# Patient Record
Sex: Female | Born: 1940 | Race: Black or African American | Hispanic: No | Marital: Single | Smoking: Former smoker
Health system: Southern US, Community
[De-identification: ages and names within clinical notes are randomized; demographics above are authoritative.]

## PROBLEM LIST (undated history)

## (undated) DIAGNOSIS — N189 Chronic kidney disease, unspecified: Secondary | ICD-10-CM

## (undated) DIAGNOSIS — G4700 Insomnia, unspecified: Secondary | ICD-10-CM

## (undated) DIAGNOSIS — E7800 Pure hypercholesterolemia, unspecified: Secondary | ICD-10-CM

## (undated) DIAGNOSIS — C50411 Malignant neoplasm of upper-outer quadrant of right female breast: Principal | ICD-10-CM

## (undated) DIAGNOSIS — D631 Anemia in chronic kidney disease: Secondary | ICD-10-CM

## (undated) DIAGNOSIS — C55 Malignant neoplasm of uterus, part unspecified: Secondary | ICD-10-CM

## (undated) DIAGNOSIS — Z973 Presence of spectacles and contact lenses: Secondary | ICD-10-CM

## (undated) DIAGNOSIS — F32A Depression, unspecified: Secondary | ICD-10-CM

## (undated) DIAGNOSIS — K279 Peptic ulcer, site unspecified, unspecified as acute or chronic, without hemorrhage or perforation: Secondary | ICD-10-CM

## (undated) DIAGNOSIS — M1990 Unspecified osteoarthritis, unspecified site: Secondary | ICD-10-CM

## (undated) DIAGNOSIS — I10 Essential (primary) hypertension: Secondary | ICD-10-CM

## (undated) DIAGNOSIS — C50919 Malignant neoplasm of unspecified site of unspecified female breast: Secondary | ICD-10-CM

## (undated) DIAGNOSIS — N184 Chronic kidney disease, stage 4 (severe): Secondary | ICD-10-CM

## (undated) DIAGNOSIS — F329 Major depressive disorder, single episode, unspecified: Secondary | ICD-10-CM

## (undated) HISTORY — DX: Malignant neoplasm of upper-outer quadrant of right female breast: C50.411

## (undated) HISTORY — DX: Chronic kidney disease, stage 4 (severe): N18.4

## (undated) HISTORY — DX: Anemia in chronic kidney disease: D63.1

## (undated) HISTORY — DX: Anemia in chronic kidney disease: N18.9

## (undated) HISTORY — DX: Malignant neoplasm of uterus, part unspecified: C55

## (undated) HISTORY — PX: COLONOSCOPY: SHX174

## (undated) NOTE — *Deleted (*Deleted)
[place]   [phone] [phone]   Clinic Follow up Note   Patient Care Team: Nolene Ebbs, MD as PCP - General (Internal Medicine) Jovita Kussmaul, MD as Consulting Physician (General Surgery) Truitt Merle, MD as Consulting Physician (Hematology) Thea Silversmith, MD as Consulting Physician (Radiation Oncology) Holley Bouche, NP (Inactive) as Nurse Practitioner (Nurse Practitioner)  Date of Service:  11/01/2020  CHIEF COMPLAINT: Follow up of right breast cancer  SUMMARY OF ONCOLOGIC HISTORY: Oncology History Overview Note  Cancer Staging Breast cancer of upper-outer quadrant of right female breast [facility]) Staging form: Breast, AJCC 7th Edition - Clinical: Stage IA (T1b, N0, M0) - Unsigned - Pathologic stage from 12/17/2014: Stage IA (T1c, N0, cM0) - Unsigned     Breast cancer of upper-outer quadrant of right female breast ([place])  10/26/2014 Imaging   screening mammogram showed a 2mm mass in right breast at 10 o'clock position.    11/05/2014 Pathology Results   Biopsy showed grade 2 IDC, ER 90%+, PR 10%+, HER2-, with grade 1 DCIS    11/06/2014 Initial Diagnosis   Breast cancer of upper-outer quadrant of right female breast   12/17/2014 Definitive Surgery   Right lumpectomy with SLNB revealed grade 2, IDC spanning 1.1 cm; associated grade 2 DCIS. HER2 repeated and remains negative. Surgical margins clear.    12/17/2014 Pathologic Stage   pT1cpN0M0; Stage IA   01/07/2015 -  Anti-estrogen oral therapy   Patient to start Aromasin on 01/14/2015.  Planned duration of therapy is 5 years. (End date: 12/2019).   01/12/2015 Survivorship   Patient eligible for Survivorship after having completed all anti-cancer treatments (with exception of anti-estrogen) and currently NED.   11/06/2016 Mammogram   IMPRESSION: No mammographic evidence of malignancy.   11/12/2017 Mammogram   IMPRESSION: No mammographic evidence of malignancy.   RECOMMENDATION: Annual diagnostic mammography.   11/13/2018 Mammogram   IMPRESSION: No evidence of malignancy in either breast. RECOMMENDATION: Bilateral diagnostic mammogram in 1 year is recommended.   11/22/2018 Imaging   Bone scan:  FINDINGS: Scoliosis and mild degenerative uptake in the thoracic and lumbar spine. Increased uptake in the feet bilaterally, likely degenerative. No focal bone uptake suspicious for metastatic disease. IMPRESSION: No evidence for osseous metastatic disease. Degenerative uptake in the spine and feet.      CURRENT THERAPY:  Exemestane 25mg  daily since 01/14/15. Completed in ***  INTERVAL HISTORY: *** Erica Whitney is here for a follow up of right breast cancer. She was last seen by me in 04/2019 and seen by NP Lacie 1 year ago in interim. She presents to the clinic alone.    REVIEW OF SYSTEMS:  *** Constitutional: Denies fevers, chills or abnormal weight loss Eyes: Denies blurriness of vision Ears, nose, mouth, throat, and face: Denies mucositis or sore throat Respiratory: Denies cough, dyspnea or wheezes Cardiovascular: Denies palpitation, chest discomfort or lower extremity swelling Gastrointestinal:  Denies nausea, heartburn or change in bowel habits Skin: Denies abnormal skin rashes Lymphatics: Denies new lymphadenopathy or easy bruising Neurological:Denies numbness, tingling or new weaknesses Behavioral/Psych: Mood is stable, no new changes  All other systems were reviewed with the patient and are negative.  MEDICAL HISTORY:  Past Medical History:  Diagnosis Date  . Arthritis   . Breast cancer of upper-outer quadrant of right female breast ([facility]) 11/06/2014  . Depression   . Diabetes mellitus   . Hypercholesteremia   . Hypertension   . Insomnia   . Peptic ulcer   .  Uterine cancer ([place])    dx in her 47s  . Wears glasses     SURGICAL HISTORY: Past Surgical History:  Procedure Laterality Date  . ABDOMINAL HYSTERECTOMY   1995  . BACK SURGERY  2000   lumb lam  . BREAST LUMPECTOMY Right 2015  . COLONOSCOPY    . ORIF METACARPAL FRACTURE  2012   left  . RADIOACTIVE SEED GUIDED PARTIAL MASTECTOMY WITH AXILLARY SENTINEL LYMPH NODE BIOPSY Right 12/17/2014   Procedure: RIGHT BREAST RADIOACTIVE SEED LOCALIZED LUMPECTOMY AND SENTINEL NODE MAPPING;  Surgeon: Autumn Messing III, MD;  Location: [place];  Service: General;  Laterality: Right;    I have reviewed the social history and family history with the patient and they are unchanged from previous note.  ALLERGIES:  has No Known Allergies.  MEDICATIONS:  Current Outpatient Medications  Medication Sig Dispense Refill  . ACCU-CHEK GUIDE test strip     . AIMSCO INSULIN SYR ULTRA THIN 31G X 5/16" 0.3 ML MISC     . aspirin EC 81 MG tablet Take 81 mg by mouth daily.      Marland Kitchen atorvastatin (LIPITOR) 40 MG tablet Take 40 mg by mouth daily.      . Blood Glucose Calibration (ACCU-CHEK GUIDE CONTROL) LIQD     . Calcium Carbonate-Vitamin D (CALCIUM 600+D) 600-400 MG-UNIT per tablet Take 1 tablet by mouth daily.      . cycloSPORINE (RESTASIS) 0.05 % ophthalmic emulsion Place 1 drop into both eyes 2 (two) times daily.      Marland Kitchen dextromethorphan-guaiFENesin (MUCINEX DM) 30-600 MG per 12 hr tablet Take 1 tablet by mouth 2 (two) times daily.    Marland Kitchen exemestane (AROMASIN) 25 MG tablet TAKE 1 TABLET BY MOUTH EVERY DAY AFTER BREAKFAST 30 tablet 3  . glimepiride (AMARYL) 4 MG tablet Take 4 mg by mouth 2 (two) times daily with a meal.      . lisinopril-hydrochlorothiazide (PRINZIDE,ZESTORETIC) 10-12.5 MG per tablet Take 1 tablet by mouth daily.      . metFORMIN (GLUCOPHAGE) 1000 MG tablet As directed    . NIFEdipine (PROCARDIA XL/ADALAT-CC) 60 MG 24 hr tablet Take 60 mg by mouth daily.      . Omega-3 Fatty Acids (FISH OIL) 1000 MG CAPS Take 1 capsule by mouth daily.    . pantoprazole (PROTONIX) 40 MG tablet Take 40 mg by mouth daily.    . sertraline (ZOLOFT) 100 MG tablet Take  100 mg by mouth every morning.      . triamcinolone cream (KENALOG) 0.5 %     . vitamin C (ASCORBIC ACID) 500 MG tablet Take 500 mg by mouth daily.       No current facility-administered medications for this visit.    PHYSICAL EXAMINATION: ECOG PERFORMANCE STATUS: {CHL ONC ECOG PS:[id number]}  There were no vitals filed for this visit. There were no vitals filed for this visit. *** GENERAL:alert, no distress and comfortable SKIN: skin color, texture, turgor are normal, no rashes or significant lesions EYES: normal, Conjunctiva are pink and non-injected, sclera clear {OROPHARYNX:no exudate, no erythema and lips, buccal mucosa, and tongue normal}  NECK: supple, thyroid normal size, non-tender, without nodularity LYMPH:  no palpable lymphadenopathy in the cervical, axillary {or inguinal} LUNGS: clear to auscultation and percussion with normal breathing effort HEART: regular rate & rhythm and no murmurs and no lower extremity edema ABDOMEN:abdomen soft, non-tender and normal bowel sounds Musculoskeletal:no cyanosis of digits and no clubbing  NEURO: alert & oriented x 3 with  fluent speech, no focal motor/sensory deficits  LABORATORY DATA:  I have reviewed the data as listed CBC Latest Ref Rng & Units 11/05/2019 05/05/2019 11/08/2018  WBC 4.0 - 10.5 K/uL 6.1 7.1 5.7  Hemoglobin 12.0 - 15.0 g/dL 10.8(L) 10.0(L) 10.7(L)  Hematocrit 36 - 46 % 34.6(L) 31.8(L) 34.5(L)  Platelets 150 - 400 K/uL 180 177 187     CMP Latest Ref Rng & Units 11/05/2019 05/05/2019 11/08/2018  Glucose 70 - 99 mg/dL 245(H) 229(H) 189(H)  BUN 8 - 23 mg/dL 29(H) 30(H) 20  Creatinine 0.44 - 1.00 mg/dL 1.51(H) 1.34(H) 1.17(H)  Sodium 135 - 145 mmol/L 144 141 143  Potassium 3.5 - 5.1 mmol/L 4.0 4.4 4.1  Chloride 98 - 111 mmol/L 107 107 107  CO2 22 - 32 mmol/L 26 25 25   Calcium 8.9 - 10.3 mg/dL 8.9 8.9 9.4  Total Protein 6.5 - 8.1 g/dL 7.3 7.1 7.3  Total Bilirubin 0.3 - 1.2 mg/dL 0.4 0.3 0.5  Alkaline Phos 38 - 126  U/L 88 84 82  AST 15 - 41 U/L 25 25 14(L)  ALT 0 - 44 U/L 22 32 10      RADIOGRAPHIC STUDIES: I have personally reviewed the radiological images as listed and agreed with the findings in the report. No results found.   ASSESSMENT & PLAN:  Erica Whitney is a 35 y.o. female with    1. 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 -She was diagnosed in 10/2014. She is s/p right lumpectomy andradiation -Currently onadjuvantExemestane since 01/07/15. She completed 5 years treatment in ***.    2.Chronic left LE pain, arthritis -Bone scan in 11/2018 was negative for osseous metastasis -Controlled with tylenol and heat. She uses walker to prevent fall  -Continue f/u with PCP   3. Osteopenia -Her12/2018bone density scan reviewed osteopenia, T score at left femoral neck -1.3. Her 11/2019 DEXA improved to normal with lowest T-score -0.9.  -Continuecalcium and vitamin D.  4. HTN, DM -On Glimepiride, Metformin. Continue to f/u with PCP   5. Mild normocytic anemia, anemia of chronic disease  -She has had a mild anemia since 2015, hemoglobin around 11 -Iron study,folate, B12 were normal in 2016. Her mild anemia is probably related to her chronic disease  -I encouraged her to take OTC prenatal vitamin.    PLAN: ***    No problem-specific Assessment & Plan notes found for this encounter.   No orders of the defined types were placed in this encounter.  All questions were answered. The patient knows to call the clinic with any problems, questions or concerns. No barriers to learning was detected. The total time spent in the appointment was {CHL ONC TIME VISIT - [id number].     Joslyn Devon 11/01/2020   Oneal Deputy, am acting as scribe for Truitt Merle, MD.   {Add scribe attestation statement}

---

## 1993-12-18 HISTORY — PX: ABDOMINAL HYSTERECTOMY: SHX81

## 1998-04-22 ENCOUNTER — Encounter: Admission: RE | Admit: 1998-04-22 | Discharge: 1998-04-22 | Payer: Self-pay | Admitting: Obstetrics

## 1998-04-23 ENCOUNTER — Encounter: Admission: RE | Admit: 1998-04-23 | Discharge: 1998-04-23 | Payer: Self-pay | Admitting: Pediatrics

## 1998-07-07 ENCOUNTER — Other Ambulatory Visit: Admission: RE | Admit: 1998-07-07 | Discharge: 1998-07-07 | Payer: Self-pay | Admitting: Family Medicine

## 1998-09-14 ENCOUNTER — Encounter: Admission: RE | Admit: 1998-09-14 | Discharge: 1998-09-14 | Payer: Self-pay | Admitting: Obstetrics & Gynecology

## 1998-09-24 ENCOUNTER — Ambulatory Visit (HOSPITAL_COMMUNITY): Admission: RE | Admit: 1998-09-24 | Discharge: 1998-09-24 | Payer: Self-pay | Admitting: Internal Medicine

## 1998-10-01 ENCOUNTER — Encounter: Admission: RE | Admit: 1998-10-01 | Discharge: 1998-10-01 | Payer: Self-pay | Admitting: Internal Medicine

## 1998-10-26 ENCOUNTER — Ambulatory Visit: Admission: RE | Admit: 1998-10-26 | Discharge: 1998-10-26 | Payer: Self-pay | Admitting: Family Medicine

## 1998-10-26 ENCOUNTER — Encounter: Payer: Self-pay | Admitting: Family Medicine

## 1998-11-05 ENCOUNTER — Encounter: Admission: RE | Admit: 1998-11-05 | Discharge: 1998-11-05 | Payer: Self-pay | Admitting: Obstetrics & Gynecology

## 1998-12-18 HISTORY — PX: BACK SURGERY: SHX140

## 1999-01-18 ENCOUNTER — Encounter: Admission: RE | Admit: 1999-01-18 | Discharge: 1999-01-18 | Payer: Self-pay | Admitting: Obstetrics & Gynecology

## 1999-01-18 ENCOUNTER — Other Ambulatory Visit: Admission: RE | Admit: 1999-01-18 | Discharge: 1999-01-18 | Payer: Self-pay | Admitting: Obstetrics & Gynecology

## 1999-07-12 ENCOUNTER — Encounter: Payer: Self-pay | Admitting: Family Medicine

## 1999-07-12 ENCOUNTER — Ambulatory Visit (HOSPITAL_COMMUNITY): Admission: RE | Admit: 1999-07-12 | Discharge: 1999-07-12 | Payer: Self-pay | Admitting: Family Medicine

## 1999-08-05 ENCOUNTER — Encounter: Admission: RE | Admit: 1999-08-05 | Discharge: 1999-08-05 | Payer: Self-pay | Admitting: Obstetrics & Gynecology

## 1999-09-26 ENCOUNTER — Ambulatory Visit (HOSPITAL_COMMUNITY): Admission: RE | Admit: 1999-09-26 | Discharge: 1999-09-26 | Payer: Self-pay | Admitting: Family Medicine

## 1999-09-26 ENCOUNTER — Encounter: Payer: Self-pay | Admitting: Family Medicine

## 1999-11-17 ENCOUNTER — Encounter: Payer: Self-pay | Admitting: Family Medicine

## 1999-11-17 ENCOUNTER — Ambulatory Visit (HOSPITAL_COMMUNITY): Admission: RE | Admit: 1999-11-17 | Discharge: 1999-11-17 | Payer: Self-pay | Admitting: Family Medicine

## 2000-01-20 ENCOUNTER — Encounter: Payer: Self-pay | Admitting: Family Medicine

## 2000-01-20 ENCOUNTER — Ambulatory Visit (HOSPITAL_COMMUNITY): Admission: RE | Admit: 2000-01-20 | Discharge: 2000-01-20 | Payer: Self-pay | Admitting: Family Medicine

## 2000-04-24 ENCOUNTER — Encounter: Admission: RE | Admit: 2000-04-24 | Discharge: 2000-04-24 | Payer: Self-pay | Admitting: Obstetrics & Gynecology

## 2000-04-24 ENCOUNTER — Other Ambulatory Visit: Admission: RE | Admit: 2000-04-24 | Discharge: 2000-04-24 | Payer: Self-pay | Admitting: Obstetrics

## 2000-06-14 ENCOUNTER — Other Ambulatory Visit: Admission: RE | Admit: 2000-06-14 | Discharge: 2000-06-14 | Payer: Self-pay | Admitting: Obstetrics

## 2000-06-14 ENCOUNTER — Encounter: Admission: RE | Admit: 2000-06-14 | Discharge: 2000-06-14 | Payer: Self-pay | Admitting: Obstetrics

## 2000-07-05 ENCOUNTER — Encounter: Admission: RE | Admit: 2000-07-05 | Discharge: 2000-07-05 | Payer: Self-pay | Admitting: Obstetrics

## 2000-07-10 ENCOUNTER — Encounter: Payer: Self-pay | Admitting: Family Medicine

## 2000-07-10 ENCOUNTER — Ambulatory Visit (HOSPITAL_COMMUNITY): Admission: RE | Admit: 2000-07-10 | Discharge: 2000-07-10 | Payer: Self-pay | Admitting: Family Medicine

## 2000-11-20 ENCOUNTER — Encounter: Payer: Self-pay | Admitting: Family Medicine

## 2000-11-20 ENCOUNTER — Ambulatory Visit (HOSPITAL_COMMUNITY): Admission: RE | Admit: 2000-11-20 | Discharge: 2000-11-20 | Payer: Self-pay | Admitting: *Deleted

## 2001-05-31 ENCOUNTER — Other Ambulatory Visit: Admission: RE | Admit: 2001-05-31 | Discharge: 2001-05-31 | Payer: Self-pay | Admitting: Obstetrics & Gynecology

## 2001-05-31 ENCOUNTER — Encounter: Admission: RE | Admit: 2001-05-31 | Discharge: 2001-05-31 | Payer: Self-pay | Admitting: Obstetrics & Gynecology

## 2001-08-09 ENCOUNTER — Ambulatory Visit (HOSPITAL_COMMUNITY): Admission: RE | Admit: 2001-08-09 | Discharge: 2001-08-09 | Payer: Self-pay | Admitting: Family Medicine

## 2001-08-21 ENCOUNTER — Ambulatory Visit (HOSPITAL_COMMUNITY): Admission: RE | Admit: 2001-08-21 | Discharge: 2001-08-21 | Payer: Self-pay | Admitting: Family Medicine

## 2001-08-21 ENCOUNTER — Encounter: Payer: Self-pay | Admitting: Family Medicine

## 2001-11-22 ENCOUNTER — Ambulatory Visit (HOSPITAL_COMMUNITY): Admission: RE | Admit: 2001-11-22 | Discharge: 2001-11-22 | Payer: Self-pay | Admitting: Family Medicine

## 2001-11-29 ENCOUNTER — Inpatient Hospital Stay (HOSPITAL_COMMUNITY): Admission: EM | Admit: 2001-11-29 | Discharge: 2001-12-02 | Payer: Self-pay | Admitting: Emergency Medicine

## 2001-12-06 ENCOUNTER — Encounter: Admission: RE | Admit: 2001-12-06 | Discharge: 2001-12-06 | Payer: Self-pay | Admitting: Obstetrics & Gynecology

## 2002-05-23 ENCOUNTER — Ambulatory Visit (HOSPITAL_COMMUNITY): Admission: RE | Admit: 2002-05-23 | Discharge: 2002-05-23 | Payer: Self-pay | Admitting: Neurosurgery

## 2002-07-11 ENCOUNTER — Other Ambulatory Visit: Admission: RE | Admit: 2002-07-11 | Discharge: 2002-07-11 | Payer: Self-pay | Admitting: Obstetrics & Gynecology

## 2002-07-25 ENCOUNTER — Encounter: Payer: Self-pay | Admitting: Internal Medicine

## 2002-07-25 ENCOUNTER — Ambulatory Visit (HOSPITAL_COMMUNITY): Admission: RE | Admit: 2002-07-25 | Discharge: 2002-07-25 | Payer: Self-pay | Admitting: Internal Medicine

## 2002-11-24 ENCOUNTER — Ambulatory Visit (HOSPITAL_COMMUNITY): Admission: RE | Admit: 2002-11-24 | Discharge: 2002-11-24 | Payer: Self-pay | Admitting: Neurosurgery

## 2003-02-09 ENCOUNTER — Inpatient Hospital Stay (HOSPITAL_COMMUNITY): Admission: RE | Admit: 2003-02-09 | Discharge: 2003-02-11 | Payer: Self-pay | Admitting: Neurosurgery

## 2003-07-28 ENCOUNTER — Ambulatory Visit (HOSPITAL_COMMUNITY): Admission: RE | Admit: 2003-07-28 | Discharge: 2003-07-28 | Payer: Self-pay | Admitting: Family Medicine

## 2004-05-06 ENCOUNTER — Ambulatory Visit (HOSPITAL_COMMUNITY): Admission: RE | Admit: 2004-05-06 | Discharge: 2004-05-06 | Payer: Self-pay | Admitting: Family Medicine

## 2004-06-01 ENCOUNTER — Encounter: Admission: RE | Admit: 2004-06-01 | Discharge: 2004-06-28 | Payer: Self-pay | Admitting: Family Medicine

## 2004-07-28 ENCOUNTER — Ambulatory Visit (HOSPITAL_COMMUNITY): Admission: RE | Admit: 2004-07-28 | Discharge: 2004-07-28 | Payer: Self-pay | Admitting: Obstetrics & Gynecology

## 2004-08-26 ENCOUNTER — Ambulatory Visit: Payer: Self-pay | Admitting: Family Medicine

## 2004-10-17 ENCOUNTER — Ambulatory Visit: Payer: Self-pay | Admitting: Family Medicine

## 2004-10-25 ENCOUNTER — Ambulatory Visit (HOSPITAL_COMMUNITY): Admission: RE | Admit: 2004-10-25 | Discharge: 2004-10-25 | Payer: Self-pay | Admitting: Family Medicine

## 2005-01-31 ENCOUNTER — Ambulatory Visit: Payer: Self-pay | Admitting: Family Medicine

## 2005-04-28 ENCOUNTER — Ambulatory Visit: Payer: Self-pay | Admitting: Family Medicine

## 2005-05-02 ENCOUNTER — Ambulatory Visit (HOSPITAL_COMMUNITY): Admission: RE | Admit: 2005-05-02 | Discharge: 2005-05-02 | Payer: Self-pay | Admitting: Family Medicine

## 2005-05-09 ENCOUNTER — Ambulatory Visit: Payer: Self-pay | Admitting: Family Medicine

## 2005-06-22 ENCOUNTER — Ambulatory Visit: Payer: Self-pay | Admitting: Family Medicine

## 2005-07-02 ENCOUNTER — Emergency Department (HOSPITAL_COMMUNITY): Admission: EM | Admit: 2005-07-02 | Discharge: 2005-07-02 | Payer: Self-pay | Admitting: Emergency Medicine

## 2005-07-26 ENCOUNTER — Ambulatory Visit: Payer: Self-pay | Admitting: Family Medicine

## 2005-08-02 ENCOUNTER — Ambulatory Visit (HOSPITAL_COMMUNITY): Admission: RE | Admit: 2005-08-02 | Discharge: 2005-08-02 | Payer: Self-pay | Admitting: Family Medicine

## 2005-10-03 ENCOUNTER — Ambulatory Visit (HOSPITAL_COMMUNITY): Admission: RE | Admit: 2005-10-03 | Discharge: 2005-10-03 | Payer: Self-pay | Admitting: Family Medicine

## 2005-11-29 ENCOUNTER — Ambulatory Visit: Payer: Self-pay | Admitting: Family Medicine

## 2006-04-05 ENCOUNTER — Ambulatory Visit: Payer: Self-pay | Admitting: Family Medicine

## 2006-04-24 ENCOUNTER — Encounter (INDEPENDENT_AMBULATORY_CARE_PROVIDER_SITE_OTHER): Payer: Self-pay | Admitting: Cardiology

## 2006-04-24 ENCOUNTER — Ambulatory Visit (HOSPITAL_COMMUNITY): Admission: RE | Admit: 2006-04-24 | Discharge: 2006-04-24 | Payer: Self-pay | Admitting: Family Medicine

## 2006-05-03 ENCOUNTER — Ambulatory Visit: Payer: Self-pay | Admitting: Family Medicine

## 2006-08-02 ENCOUNTER — Ambulatory Visit: Payer: Self-pay | Admitting: Family Medicine

## 2006-08-03 ENCOUNTER — Ambulatory Visit (HOSPITAL_COMMUNITY): Admission: RE | Admit: 2006-08-03 | Discharge: 2006-08-03 | Payer: Self-pay | Admitting: Family Medicine

## 2006-12-07 ENCOUNTER — Ambulatory Visit: Payer: Self-pay | Admitting: Family Medicine

## 2007-03-12 ENCOUNTER — Ambulatory Visit: Payer: Self-pay | Admitting: Family Medicine

## 2007-06-28 ENCOUNTER — Ambulatory Visit: Payer: Self-pay | Admitting: Family Medicine

## 2007-08-07 ENCOUNTER — Ambulatory Visit (HOSPITAL_COMMUNITY): Admission: RE | Admit: 2007-08-07 | Discharge: 2007-08-07 | Payer: Self-pay | Admitting: Family Medicine

## 2007-09-01 ENCOUNTER — Emergency Department (HOSPITAL_COMMUNITY): Admission: EM | Admit: 2007-09-01 | Discharge: 2007-09-02 | Payer: Self-pay | Admitting: Emergency Medicine

## 2007-11-29 ENCOUNTER — Ambulatory Visit: Payer: Self-pay | Admitting: Family Medicine

## 2007-12-21 ENCOUNTER — Emergency Department (HOSPITAL_COMMUNITY): Admission: EM | Admit: 2007-12-21 | Discharge: 2007-12-21 | Payer: Self-pay | Admitting: Emergency Medicine

## 2008-01-03 ENCOUNTER — Ambulatory Visit: Payer: Self-pay | Admitting: Internal Medicine

## 2008-01-27 ENCOUNTER — Encounter (INDEPENDENT_AMBULATORY_CARE_PROVIDER_SITE_OTHER): Payer: Self-pay | Admitting: Family Medicine

## 2008-01-27 ENCOUNTER — Ambulatory Visit: Payer: Self-pay | Admitting: Internal Medicine

## 2008-01-27 LAB — CONVERTED CEMR LAB
ALT: 30 units/L (ref 0–35)
Alkaline Phosphatase: 77 units/L (ref 39–117)
BUN: 18 mg/dL (ref 6–23)
Creatinine, Ser: 0.74 mg/dL (ref 0.40–1.20)
Glucose, Bld: 149 mg/dL — ABNORMAL HIGH (ref 70–99)
Potassium: 3.6 meq/L (ref 3.5–5.3)
Sodium: 144 meq/L (ref 135–145)

## 2008-03-24 ENCOUNTER — Ambulatory Visit: Payer: Self-pay | Admitting: Family Medicine

## 2008-03-31 ENCOUNTER — Ambulatory Visit: Payer: Self-pay | Admitting: Internal Medicine

## 2008-05-05 ENCOUNTER — Ambulatory Visit: Payer: Self-pay | Admitting: Internal Medicine

## 2008-08-12 ENCOUNTER — Ambulatory Visit (HOSPITAL_COMMUNITY): Admission: RE | Admit: 2008-08-12 | Discharge: 2008-08-12 | Payer: Self-pay | Admitting: Family Medicine

## 2008-10-06 ENCOUNTER — Ambulatory Visit: Payer: Self-pay | Admitting: Family Medicine

## 2008-10-06 LAB — CONVERTED CEMR LAB
ALT: 20 units/L (ref 0–35)
AST: 19 units/L (ref 0–37)
Albumin: 4.1 g/dL (ref 3.5–5.2)
Alkaline Phosphatase: 76 units/L (ref 39–117)
BUN: 16 mg/dL (ref 6–23)
Calcium: 9.3 mg/dL (ref 8.4–10.5)
HDL: 52 mg/dL (ref >39)
Potassium: 3.4 meq/L — ABNORMAL LOW (ref 3.5–5.3)
Sodium: 143 meq/L (ref 135–145)
Total Bilirubin: 0.5 mg/dL (ref 0.3–1.2)
VLDL: 30 mg/dL (ref 0–40)

## 2008-10-07 ENCOUNTER — Encounter (INDEPENDENT_AMBULATORY_CARE_PROVIDER_SITE_OTHER): Payer: Self-pay | Admitting: Family Medicine

## 2008-12-11 ENCOUNTER — Emergency Department (HOSPITAL_COMMUNITY): Admission: EM | Admit: 2008-12-11 | Discharge: 2008-12-11 | Payer: Self-pay | Admitting: Emergency Medicine

## 2009-05-06 ENCOUNTER — Ambulatory Visit: Payer: Self-pay | Admitting: Family Medicine

## 2009-05-11 ENCOUNTER — Ambulatory Visit (HOSPITAL_COMMUNITY): Admission: RE | Admit: 2009-05-11 | Discharge: 2009-05-11 | Payer: Self-pay | Admitting: Family Medicine

## 2009-07-01 ENCOUNTER — Ambulatory Visit: Payer: Self-pay | Admitting: Family Medicine

## 2009-07-01 LAB — CONVERTED CEMR LAB
Albumin: 4 g/dL (ref 3.5–5.2)
Alkaline Phosphatase: 70 units/L (ref 39–117)
CO2: 26 meq/L (ref 19–32)
Chloride: 104 meq/L (ref 96–112)
Cholesterol: 176 mg/dL (ref 0–200)
Glucose, Bld: 167 mg/dL — ABNORMAL HIGH (ref 70–99)
HDL: 50 mg/dL (ref >39)
Total Bilirubin: 0.4 mg/dL (ref 0.3–1.2)
Total CHOL/HDL Ratio: 3.5
Total Protein: 7.5 g/dL (ref 6.0–8.3)
VLDL: 33 mg/dL (ref 0–40)

## 2009-08-17 ENCOUNTER — Ambulatory Visit (HOSPITAL_COMMUNITY): Admission: RE | Admit: 2009-08-17 | Discharge: 2009-08-17 | Payer: Self-pay | Admitting: Internal Medicine

## 2009-11-05 ENCOUNTER — Ambulatory Visit: Payer: Self-pay | Admitting: Family Medicine

## 2009-11-30 ENCOUNTER — Telehealth (INDEPENDENT_AMBULATORY_CARE_PROVIDER_SITE_OTHER): Payer: Self-pay | Admitting: *Deleted

## 2010-02-07 ENCOUNTER — Ambulatory Visit: Payer: Self-pay | Admitting: Family Medicine

## 2010-02-07 LAB — CONVERTED CEMR LAB
BUN: 18 mg/dL (ref 6–23)
CO2: 23 meq/L (ref 19–32)
Calcium: 9.4 mg/dL (ref 8.4–10.5)
Creatinine, Ser: 0.79 mg/dL (ref 0.40–1.20)
Eosinophils Absolute: 0.2 10*3/uL (ref 0.0–0.7)
Eosinophils Relative: 2 % (ref 0–5)
Glucose, Bld: 312 mg/dL — ABNORMAL HIGH (ref 70–99)
Lymphs Abs: 1.9 10*3/uL (ref 0.7–4.0)
MCHC: 31.3 g/dL (ref 30.0–36.0)
RDW: 13.6 % (ref 11.5–15.5)
Vit D, 25-Hydroxy: 20 ng/mL — ABNORMAL LOW (ref 30–89)
WBC: 6.5 10*3/uL (ref 4.0–10.5)

## 2010-02-22 ENCOUNTER — Ambulatory Visit: Payer: Self-pay | Admitting: Family Medicine

## 2010-05-19 ENCOUNTER — Ambulatory Visit: Payer: Self-pay | Admitting: Internal Medicine

## 2010-08-18 ENCOUNTER — Ambulatory Visit (HOSPITAL_COMMUNITY): Admission: RE | Admit: 2010-08-18 | Discharge: 2010-08-18 | Payer: Self-pay | Admitting: Family Medicine

## 2010-11-22 ENCOUNTER — Encounter (INDEPENDENT_AMBULATORY_CARE_PROVIDER_SITE_OTHER): Payer: Self-pay | Admitting: Family Medicine

## 2010-11-22 LAB — CONVERTED CEMR LAB
ALT: 23 units/L (ref 0–35)
CO2: 27 meq/L (ref 19–32)
Calcium: 10.1 mg/dL (ref 8.4–10.5)
Cholesterol: 202 mg/dL — ABNORMAL HIGH (ref 0–200)
Creatinine, Ser: 0.88 mg/dL (ref 0.40–1.20)
Glucose, Bld: 97 mg/dL (ref 70–99)
HDL: 53 mg/dL (ref >39)
Potassium: 4.2 meq/L (ref 3.5–5.3)
Sodium: 142 meq/L (ref 135–145)
VLDL: 22 mg/dL (ref 0–40)

## 2010-12-18 HISTORY — PX: ORIF METACARPAL FRACTURE: SUR940

## 2011-02-17 ENCOUNTER — Emergency Department (HOSPITAL_COMMUNITY): Payer: PRIVATE HEALTH INSURANCE

## 2011-02-17 ENCOUNTER — Emergency Department (HOSPITAL_COMMUNITY)
Admission: EM | Admit: 2011-02-17 | Discharge: 2011-02-17 | Disposition: A | Discharge status: Home / Self Care | Payer: PRIVATE HEALTH INSURANCE | Attending: Emergency Medicine | Admitting: Emergency Medicine

## 2011-02-17 DIAGNOSIS — M25579 Pain in unspecified ankle and joints of unspecified foot: Secondary | ICD-10-CM | POA: Insufficient documentation

## 2011-02-17 DIAGNOSIS — F329 Major depressive disorder, single episode, unspecified: Secondary | ICD-10-CM | POA: Insufficient documentation

## 2011-02-17 DIAGNOSIS — S62309A Unspecified fracture of unspecified metacarpal bone, initial encounter for closed fracture: Secondary | ICD-10-CM | POA: Insufficient documentation

## 2011-02-17 DIAGNOSIS — M129 Arthropathy, unspecified: Secondary | ICD-10-CM | POA: Insufficient documentation

## 2011-02-17 DIAGNOSIS — M109 Gout, unspecified: Secondary | ICD-10-CM | POA: Insufficient documentation

## 2011-02-17 DIAGNOSIS — W010XXA Fall on same level from slipping, tripping and stumbling without subsequent striking against object, initial encounter: Secondary | ICD-10-CM | POA: Insufficient documentation

## 2011-02-17 DIAGNOSIS — E119 Type 2 diabetes mellitus without complications: Secondary | ICD-10-CM | POA: Insufficient documentation

## 2011-02-17 DIAGNOSIS — M79609 Pain in unspecified limb: Secondary | ICD-10-CM | POA: Insufficient documentation

## 2011-02-17 DIAGNOSIS — S93409A Sprain of unspecified ligament of unspecified ankle, initial encounter: Secondary | ICD-10-CM | POA: Insufficient documentation

## 2011-02-17 DIAGNOSIS — I10 Essential (primary) hypertension: Secondary | ICD-10-CM | POA: Insufficient documentation

## 2011-02-17 DIAGNOSIS — F3289 Other specified depressive episodes: Secondary | ICD-10-CM | POA: Insufficient documentation

## 2011-02-17 DIAGNOSIS — E7800 Pure hypercholesterolemia, unspecified: Secondary | ICD-10-CM | POA: Insufficient documentation

## 2011-04-10 ENCOUNTER — Ambulatory Visit: Payer: PRIVATE HEALTH INSURANCE | Attending: Orthopaedic Surgery | Admitting: Occupational Therapy

## 2011-04-10 DIAGNOSIS — M25649 Stiffness of unspecified hand, not elsewhere classified: Secondary | ICD-10-CM | POA: Insufficient documentation

## 2011-04-10 DIAGNOSIS — M25549 Pain in joints of unspecified hand: Secondary | ICD-10-CM | POA: Insufficient documentation

## 2011-04-10 DIAGNOSIS — IMO0001 Reserved for inherently not codable concepts without codable children: Secondary | ICD-10-CM | POA: Insufficient documentation

## 2011-04-24 ENCOUNTER — Ambulatory Visit: Payer: PRIVATE HEALTH INSURANCE | Attending: Orthopaedic Surgery | Admitting: Occupational Therapy

## 2011-04-24 DIAGNOSIS — M25549 Pain in joints of unspecified hand: Secondary | ICD-10-CM | POA: Insufficient documentation

## 2011-04-24 DIAGNOSIS — IMO0001 Reserved for inherently not codable concepts without codable children: Secondary | ICD-10-CM | POA: Insufficient documentation

## 2011-04-24 DIAGNOSIS — M25649 Stiffness of unspecified hand, not elsewhere classified: Secondary | ICD-10-CM | POA: Insufficient documentation

## 2011-04-26 ENCOUNTER — Ambulatory Visit: Payer: PRIVATE HEALTH INSURANCE | Admitting: Occupational Therapy

## 2011-05-01 ENCOUNTER — Ambulatory Visit: Payer: PRIVATE HEALTH INSURANCE | Admitting: Occupational Therapy

## 2011-05-03 ENCOUNTER — Ambulatory Visit: Payer: PRIVATE HEALTH INSURANCE | Admitting: Occupational Therapy

## 2011-05-05 NOTE — Op Note (Signed)
NAME:  Erica Whitney, Erica Whitney                      ACCOUNT NO.:  [id number]   MEDICAL RECORD NO.:  [id number]                   PATIENT TYPE:  INP   LOCATION:  3010                                 FACILITY:  [facility]   PHYSICIAN:  Ophelia Charter, M.D.            DATE OF BIRTH:  08/31/1941   DATE OF PROCEDURE:  02/09/2003  DATE OF DISCHARGE:                                 OPERATIVE REPORT   BRIEF HISTORY:  The patient is a 70 year old black female who suffered from  back and leg pain consistent with neurogenic claudication. She was worked up  with a lumbar MRI which demonstrated multifactorial spinal stenosis at L4-5.  I discussed the various treatment options with her including surgery. The  patient weighed the risks, benefits, and alternatives of surgery and decided  to proceed with a L4 decompressive laminectomy.   PREOPERATIVE DIAGNOSES:  L4-5 spinal stenosis, degenerative disk disease,  lumbar radiculopathy, lumbago.   POSTOPERATIVE DIAGNOSES:  L4-5 spinal stenosis, degenerative disk disease,  lumbar radiculopathy, lumbago.   PROCEDURE:  L4 laminectomy foraminotomy using microdissection.   SURGEON:  Ophelia Charter, M.D.   ASSISTANT:  Elizabeth Sauer, M.D.   ANESTHESIA:  General endotracheal.   ESTIMATED BLOOD LOSS:  100 mL   SPECIMENS:  None.   DRAINS:  None.   COMPLICATIONS:  None.   DESCRIPTION OF PROCEDURE:  The patient was brought to the operating room by  the anesthesia team, general endotracheal anesthesia was induced. The  patient was then turned to the prone position on the Wilson frame, the  lumbosacral region was then prepared with Betadine scrub with Betadine  solution. Sterile drapes were applied. I then injected the areas being  incised with Marcaine with epinephrine solution, used a scalpel to make a  linear midline incision over the L4-5 interspace. I used electrocautery to  dissect down to the thoracolumbar fascia, I divided the fascia bilaterally  performing a bilateral subperiosteal dissection, stripped the paraspinous  musculature from the spinous process lamina of L4. I inserted a McCall  retractor for exposure. I then obtained an intraoperative radiograph to  confirm our location. I then used the scalpel to incise the L4-5 and L3-4  interspinous ligament and used the Leksell rongeur to remove the L4 spinous  process and part of the L4 lamina. I then used the high speed drill to  perform bilateral L4 laminotomies.   I then brought the operative microscope into the field and under  magnification and illumination completed the microdissection/decompression.  I completed the L4 laminectomy with the Kerrison punch and removed the  ligamentum flavum at L3-4 and L4-5 decompressing the thecal sac. I then  performed foraminotomies about the bilateral L4 and L5 nerve roots  decompressing the nerve roots well. I then used microdissection to free up  the thecal sac and the nerve roots to allow me to inspect the L4-5  intervertebral disk. It was bulging mildly but there  was no significant  neural compression. I then achieved stringent hemostasis using bipolar  electrocautery and then copiously irrigated the wound out with kanamycin  solution. I then removed the kanamycin solution and the McCall retractor and  then reapproximated the patient's thoracolumbar fascia with interrupted #1  Vicryl suture, the subcutaneous tissue with interrupted 2-0 Vicryl suture  and the skin with Steri-Strips and Benzoin. The patient was then coated with  Bacitracin ointment, a sterile dressing was applied, the drapes were removed  and the patient was subsequently returned to supine position where she was  extubated by the anesthesia team and transported to the post anesthesia care  unit in stable condition. All sponge, needle and instrument counts were  correct at the end of this case.                                               Ophelia Charter,  M.D.    JDJ/MEDQ  D:  02/09/2003  T:  02/09/2003  Job:  [id number]

## 2011-05-05 NOTE — Discharge Summary (Signed)
[place]  Patient:    Erica Whitney, Erica Whitney Visit Number: [id number] MRN: [id number]          Service Type: MED Location: [phone] Attending Physician:  Thomes Lolling Dictated by:   Harlow Asa, M.D. Admit Date:  11/29/2001 Discharge Date: 12/02/2001   CC:         Mickeal Skinner, M.D., gastroenterology                           Discharge Summary  DATE OF BIRTH:  09-16-1941.  PROBLEM LIST: 1. Gastrointestinal bleed.  Probably secondary to recurrent duodenal ulcer. 2. Anemia secondary to #1. 3. Hypertension. 4. Anxiety/depression. 5. Hypercholesterolemia. 6. Diverticulosis per barium enema in July of 2001. 7. History of endometrial cancer grade I, status post total abdominal    hysterectomy, bilateral salpingo-oophorectomy. 8. History of uterine fibroids.  MEDICATIONS AT DISCHARGE: 1. Adalat 60 mg once a day. 2. Triamterene/HCTZ once a day. 3. Trazodone 150 mg one before bed. 4. Zoloft 50 mg once a day. 5. Lescol XL 80 mg once before bed. 6. Lortab 5/500 q.6h. p.r.n. pain. 7. Protonix 40 mg p.o. b.i.d. x30 days. 8. The patient was to stop both Axid and Arthrotec.  PROCEDURES DURING THIS ADMISSION:  EGD by Mickeal Skinner, M.D.  This demonstrated recurrent duodenal ulcer with low risk of rebleeding.  Also had negative CLOtest.  CONSULTANTS:  Mickeal Skinner, M.D., gastroenterology.  ADMISSION HISTORY:  Ms. Erica Whitney is a 70 year old woman with a history of hypertension, recurrent duodenal ulcer and mild diverticulosis who presented with a seven-day history of fatigue, generalized weakness and presyncope. Those feelings had come off and on for the entire time and were worsened by standing up. She had had similar symptoms in the past when she had had episodes of anemia.  She denied any nausea, vomiting, or diarrhea. She said that she had both dark and bright red stools recently. She had also  had increasing shortness of breath which was relieved when she would lie down.  PHYSICAL EXAMINATION ON ADMISSION:  GENERAL APPEARANCE:  This is a 70 year old African American woman in no acute distress.  She was somewhat confused and circumferential but alert.  VITAL SIGNS:  Temperature 98.3, blood pressure 124/50, pulse 112, respiratory rate 20, O2 sats 99% on room air.  CHEST:  Clear to auscultation bilaterally with good air movement.  CARDIOVASCULAR:  Her heart was tachy but regular with no murmurs.  ABDOMEN:  Soft, nontender and nondistended with positive bowel sounds.  NEUROLOGICAL:  Exam was unremarkable.  RECTAL:  Exam showed black tarry stool that was Hemoccult positive.  ORTHOSTATICS:  She she stood up, she became very dizzy and her heart rate went from 84 to 132.  The remainder of her physical examination was unremarkable.  LABS ON ADMISSION:  White blood cell count 16.9, hemoglobin 5.1 with MCV of 93.2 and RDW of 14.2, platelets 283.  Differential on the white count was 66 neutrophils, 27 lymphocytes, 7 monocytes.  The platelets were described as large in the peripheral smear.  Sodium 140, potassium 2.7, chloride 105, bicarb 25, BUN 27, creatinine 1.0, glucose 156, calcium 8.7.  Magnesium 2.1, AST 23, ALT 21, alkaline phosphatase 61, total bilirubin 0.6, total protein 6.3, albumin 3.2.  PT 15.5, INR 1.3, PTT 28.  HOSPITAL COURSE BY PROBLEM LIST:  ANEMIA/GASTROINTESTINAL BLEED:  Hemoglobin on admission was 5.1 off a baseline of  12.1 last checked in January. The anemia was normocytic and consistent with acute blood loss.  EGD was performed with the results described above at the time of admission.  She was also immediately transfused two units of packed red blood cells.  On the day following the admission, her hemoglobin was up to 6.8 and she was again transfused.  Her symptoms had resolved somewhat at that time.  On December 01, 2001, her hemoglobin was up to 9.2  and was stable.  Her CLOtest became negative for H. pylori and it was thought this might be due to aggravation of the ulcer from the NSAID use.  Her hemoglobin went only from 9.2 to 8.9 on December 02, 2001, and she was discharged home with instructions to follow up in the clinic.  All other issues were stable throughout this admission.  FOLLOW-UP:  The patient was to follow up on Friday, December 06, 2001, at 10:30 a.m. through the [facility] and was to have a CBC at that time.  She was also to be seen at [facility] on December 13, 2001, at 8 oclock a.m.  She was encouraged to return to the ER if she felt fatigue, shortness of breath, or dark or red stools returning. Dictated by:   Harlow Asa, M.D. Attending Physician:  Thomes Lolling DD:  01/23/02 TD:  01/23/02 Job: [id number]

## 2011-05-09 ENCOUNTER — Ambulatory Visit: Payer: PRIVATE HEALTH INSURANCE | Admitting: Occupational Therapy

## 2011-05-11 ENCOUNTER — Ambulatory Visit: Payer: PRIVATE HEALTH INSURANCE | Admitting: Occupational Therapy

## 2011-05-16 ENCOUNTER — Encounter: Payer: PRIVATE HEALTH INSURANCE | Admitting: Occupational Therapy

## 2011-05-18 ENCOUNTER — Ambulatory Visit: Payer: PRIVATE HEALTH INSURANCE | Admitting: Occupational Therapy

## 2011-07-25 ENCOUNTER — Other Ambulatory Visit (HOSPITAL_COMMUNITY): Payer: Self-pay | Admitting: Family Medicine

## 2011-07-25 DIAGNOSIS — Z1231 Encounter for screening mammogram for malignant neoplasm of breast: Secondary | ICD-10-CM

## 2011-08-22 ENCOUNTER — Ambulatory Visit (HOSPITAL_COMMUNITY)
Admission: RE | Admit: 2011-08-22 | Discharge: 2011-08-22 | Disposition: A | Discharge status: Home / Self Care | Payer: PRIVATE HEALTH INSURANCE | Source: Ambulatory Visit | Attending: Family Medicine | Admitting: Family Medicine

## 2011-08-22 DIAGNOSIS — Z1231 Encounter for screening mammogram for malignant neoplasm of breast: Secondary | ICD-10-CM | POA: Insufficient documentation

## 2011-09-28 LAB — I-STAT 8, (EC8 V) (CONVERTED LAB)
Acid-Base Excess: 2
Glucose, Bld: 143 — ABNORMAL HIGH
HCT: 37
pCO2, Ven: 36.8 — ABNORMAL LOW

## 2011-09-28 LAB — POCT I-STAT 3, ART BLOOD GAS (G3+)
pCO2 arterial: 40.1
pH, Arterial: 7.433 — ABNORMAL HIGH

## 2011-09-29 LAB — CBC
Platelets: 216
RDW: 14.1 — ABNORMAL HIGH

## 2011-09-29 LAB — DIFFERENTIAL
Basophils Absolute: 0
Lymphocytes Relative: 36
Neutro Abs: 4.7
Neutrophils Relative %: 52

## 2011-09-29 LAB — SALICYLATE LEVEL: Salicylate Lvl: <4

## 2011-09-29 LAB — RAPID URINE DRUG SCREEN, HOSP PERFORMED
Cocaine: NOT DETECTED
Opiates: NOT DETECTED

## 2011-09-29 LAB — ACETAMINOPHEN LEVEL: Acetaminophen (Tylenol), Serum: <10 — ABNORMAL LOW

## 2011-09-29 LAB — ETHANOL: Alcohol, Ethyl (B): <5

## 2011-12-26 ENCOUNTER — Encounter: Payer: Self-pay | Admitting: *Deleted

## 2011-12-26 ENCOUNTER — Emergency Department (HOSPITAL_COMMUNITY)
Admission: EM | Admit: 2011-12-26 | Discharge: 2011-12-26 | Disposition: A | Discharge status: Home / Self Care | Payer: PRIVATE HEALTH INSURANCE | Attending: Emergency Medicine | Admitting: Emergency Medicine

## 2011-12-26 DIAGNOSIS — R3589 Other polyuria: Secondary | ICD-10-CM | POA: Insufficient documentation

## 2011-12-26 DIAGNOSIS — R358 Other polyuria: Secondary | ICD-10-CM | POA: Insufficient documentation

## 2011-12-26 DIAGNOSIS — Z7982 Long term (current) use of aspirin: Secondary | ICD-10-CM | POA: Insufficient documentation

## 2011-12-26 DIAGNOSIS — M129 Arthropathy, unspecified: Secondary | ICD-10-CM | POA: Insufficient documentation

## 2011-12-26 DIAGNOSIS — F3289 Other specified depressive episodes: Secondary | ICD-10-CM | POA: Insufficient documentation

## 2011-12-26 DIAGNOSIS — E119 Type 2 diabetes mellitus without complications: Secondary | ICD-10-CM | POA: Insufficient documentation

## 2011-12-26 DIAGNOSIS — F329 Major depressive disorder, single episode, unspecified: Secondary | ICD-10-CM | POA: Insufficient documentation

## 2011-12-26 DIAGNOSIS — Z79899 Other long term (current) drug therapy: Secondary | ICD-10-CM | POA: Insufficient documentation

## 2011-12-26 DIAGNOSIS — R739 Hyperglycemia, unspecified: Secondary | ICD-10-CM

## 2011-12-26 DIAGNOSIS — I10 Essential (primary) hypertension: Secondary | ICD-10-CM | POA: Insufficient documentation

## 2011-12-26 DIAGNOSIS — E7800 Pure hypercholesterolemia, unspecified: Secondary | ICD-10-CM | POA: Insufficient documentation

## 2011-12-26 HISTORY — DX: Peptic ulcer, site unspecified, unspecified as acute or chronic, without hemorrhage or perforation: K27.9

## 2011-12-26 HISTORY — DX: Unspecified osteoarthritis, unspecified site: M19.90

## 2011-12-26 HISTORY — DX: Essential (primary) hypertension: I10

## 2011-12-26 HISTORY — DX: Major depressive disorder, single episode, unspecified: F32.9

## 2011-12-26 HISTORY — DX: Pure hypercholesterolemia, unspecified: E78.00

## 2011-12-26 HISTORY — DX: Depression, unspecified: F32.A

## 2011-12-26 HISTORY — DX: Insomnia, unspecified: G47.00

## 2011-12-26 LAB — COMPREHENSIVE METABOLIC PANEL
Albumin: 3.7 g/dL (ref 3.5–5.2)
BUN: 22 mg/dL (ref 6–23)
CO2: 28 mEq/L (ref 19–32)
Chloride: 100 mEq/L (ref 96–112)
Creatinine, Ser: 0.74 mg/dL (ref 0.50–1.10)
GFR calc Af Amer: >90 mL/min (ref >90)
GFR calc non Af Amer: 85 mL/min — ABNORMAL LOW (ref >90)
Glucose, Bld: 376 mg/dL — ABNORMAL HIGH (ref 70–99)
Total Bilirubin: 0.3 mg/dL (ref 0.3–1.2)

## 2011-12-26 LAB — CBC
HCT: 36.8 % (ref 36.0–46.0)
Hemoglobin: 12.2 g/dL (ref 12.0–15.0)
MCH: 29.4 pg (ref 26.0–34.0)
MCHC: 33.2 g/dL (ref 30.0–36.0)

## 2011-12-26 LAB — GLUCOSE, CAPILLARY: Glucose-Capillary: 379 mg/dL — ABNORMAL HIGH (ref 70–99)

## 2011-12-26 LAB — DIFFERENTIAL
Basophils Relative: 0 % (ref 0–1)
Monocytes Absolute: 0.7 10*3/uL (ref 0.1–1.0)
Monocytes Relative: 12 % (ref 3–12)
Neutro Abs: 3.6 10*3/uL (ref 1.7–7.7)

## 2011-12-26 LAB — URINALYSIS, ROUTINE W REFLEX MICROSCOPIC
Ketones, ur: NEGATIVE mg/dL
Leukocytes, UA: NEGATIVE
Nitrite: NEGATIVE
Protein, ur: NEGATIVE mg/dL
Urobilinogen, UA: 0.2 mg/dL (ref 0.0–1.0)

## 2011-12-26 LAB — LIPASE, BLOOD: Lipase: 21 U/L (ref 11–59)

## 2011-12-26 MED ORDER — SODIUM CHLORIDE 0.9 % IV BOLUS (SEPSIS)
1000.0000 mL | Freq: Once | INTRAVENOUS | Status: AC
  Administered 2011-12-26: 1000 mL via INTRAVENOUS

## 2011-12-26 MED ORDER — INSULIN REGULAR HUMAN 100 UNIT/ML IJ SOLN
10.0000 [IU] | Freq: Once | INTRAMUSCULAR | Status: AC
  Administered 2011-12-26: 10 [IU] via INTRAVENOUS
  Filled 2011-12-26 (×2): qty 0.1

## 2011-12-26 NOTE — ED Provider Notes (Signed)
History     CSN: [id number]  Arrival date & time 12/26/11  30   First MD Initiated Contact with Patient 12/26/11 1134      Chief Complaint  Patient presents with  . Hyperglycemia    (Consider location/radiation/quality/duration/timing/severity/associated sxs/prior treatment) HPI The patient presents with concerns of hyperglycemia.  She denies focal complaints, does note that there has been mild polyuria over the past few days. He presented today for a regularly scheduled visit, and on initial evaluation was noted to have blood sugar greater than 500. She notes that she has taken her insulin as directed, but her sugar has not responded. She denies any cough, dysuria, abdominal pain, nausea or overt suggestions of infection Past Medical History  Diagnosis Date  . Hypertension   . Diabetes mellitus   . Hypercholesteremia   . Peptic ulcer   . Arthritis   . Insomnia   . Depression     Past Surgical History  Procedure Date  . Abdominal hysterectomy     No family history on file.  History  Substance Use Topics  . Smoking status: Never Smoker   . Smokeless tobacco: Current User    Types: Snuff  . Alcohol Use: No    OB History    Grav Para Term Preterm Abortions TAB SAB Ect Mult Living                  Review of Systems  Constitutional:       HPI  HENT:       HPI otherwise negative  Eyes: Negative.   Respiratory:       HPI, otherwise negative  Cardiovascular:       HPI, otherwise nmegative  Gastrointestinal: Negative for vomiting.  Genitourinary:       HPI, otherwise negative  Musculoskeletal:       HPI, otherwise negative  Skin: Negative.   Neurological: Negative for syncope.    Allergies  Review of patient's allergies indicates no known allergies.  Home Medications   Current Outpatient Rx  Name Route Sig Dispense Refill  . ASPIRIN EC 81 MG PO TBEC Oral Take 81 mg by mouth daily.      . ATORVASTATIN CALCIUM 40 MG PO TABS Oral Take 40 mg by mouth  daily.      Marland Kitchen CALCIUM CARBONATE-VITAMIN D 600-400 MG-UNIT PO TABS Oral Take 1 tablet by mouth daily.      . CYCLOSPORINE 0.05 % OP EMUL Both Eyes Place 1 drop into both eyes 2 (two) times daily.      Marland Kitchen GLIMEPIRIDE 4 MG PO TABS Oral Take 4 mg by mouth 2 (two) times daily with a meal.      . LISINOPRIL-HYDROCHLOROTHIAZIDE 10-12.5 MG PO TABS Oral Take 1 tablet by mouth daily.      Marland Kitchen NIFEDIPINE ER OSMOTIC 60 MG PO TB24 Oral Take 60 mg by mouth daily.      Marland Kitchen OMEPRAZOLE 20 MG PO CPDR Oral Take 20 mg by mouth 2 (two) times daily.      . SERTRALINE HCL 100 MG PO TABS Oral Take 100 mg by mouth every morning.      . TURMERIC 450 MG PO CAPS Oral Take 1 capsule by mouth daily.      Marland Kitchen VITAMIN C 500 MG PO TABS Oral Take 500 mg by mouth daily.      Marland Kitchen ZOLPIDEM TARTRATE 10 MG PO TABS Oral Take 5 mg by mouth at bedtime as needed. She takes four nights  a week for sleep.       BP 155/75  Pulse 62  Temp(Src) 98.8 F (37.1 C) (Oral)  Resp 18  SpO2 96%  Physical Exam  Nursing note and vitals reviewed. Constitutional: She is oriented to person, place, and time. She appears well-developed and well-nourished. No distress.  HENT:  Head: Normocephalic and atraumatic.  Eyes: Conjunctivae and EOM are normal.  Cardiovascular: Normal rate and regular rhythm.   Pulmonary/Chest: Effort normal and breath sounds normal. No stridor. No respiratory distress.  Abdominal: She exhibits no distension.  Musculoskeletal: She exhibits no edema.  Neurological: She is alert and oriented to person, place, and time. No cranial nerve deficit.  Skin: Skin is warm and dry.  Psychiatric: She has a normal mood and affect.    ED Course  Procedures (including critical care time)  Labs Reviewed  GLUCOSE, CAPILLARY - Abnormal; Notable for the following:    Glucose-Capillary 379 (*)    All other components within normal limits  POCT CBG MONITORING  CBC  DIFFERENTIAL  COMPREHENSIVE METABOLIC PANEL  LIPASE, BLOOD  URINALYSIS,  ROUTINE W REFLEX MICROSCOPIC   No results found.   No diagnosis found.    MDM  71 year old female presents with concerns of hyperglycemia. She was referred to the emergency department from her primary care physician's office following a scheduled visit, with an initial evaluation notable for hyperglycemia. The patient denies significant complaints, notably noting only mild polyuria.  She is in no distress with unremarkable vital signs. The patient's labs do not demonstrate ketonuria, nor significant abnormalities beyond hyperglycemia. The patient received insulin with appropriate reduction in her blood glucose. All results were discussed with the patient and her daughter. The patient is stable for discharge with continued home he continues. She was advised to continue working with her primary care physician to        Carmin Muskrat, MD 12/26/11 1541

## 2011-12-26 NOTE — ED Notes (Signed)
Pt reports going to [facility] today for regular check up and being sent here for cbg >500. Only c/o thirst.

## 2012-01-30 ENCOUNTER — Encounter (HOSPITAL_COMMUNITY): Payer: Self-pay | Admitting: Emergency Medicine

## 2012-01-30 ENCOUNTER — Emergency Department (HOSPITAL_COMMUNITY)
Admission: EM | Admit: 2012-01-30 | Discharge: 2012-01-30 | Disposition: A | Discharge status: Home / Self Care | Payer: PRIVATE HEALTH INSURANCE | Attending: Emergency Medicine | Admitting: Emergency Medicine

## 2012-01-30 DIAGNOSIS — Z79899 Other long term (current) drug therapy: Secondary | ICD-10-CM | POA: Insufficient documentation

## 2012-01-30 DIAGNOSIS — R059 Cough, unspecified: Secondary | ICD-10-CM | POA: Insufficient documentation

## 2012-01-30 DIAGNOSIS — R05 Cough: Secondary | ICD-10-CM | POA: Insufficient documentation

## 2012-01-30 DIAGNOSIS — F329 Major depressive disorder, single episode, unspecified: Secondary | ICD-10-CM | POA: Insufficient documentation

## 2012-01-30 DIAGNOSIS — I10 Essential (primary) hypertension: Secondary | ICD-10-CM | POA: Insufficient documentation

## 2012-01-30 DIAGNOSIS — E119 Type 2 diabetes mellitus without complications: Secondary | ICD-10-CM | POA: Insufficient documentation

## 2012-01-30 DIAGNOSIS — E7800 Pure hypercholesterolemia, unspecified: Secondary | ICD-10-CM | POA: Insufficient documentation

## 2012-01-30 DIAGNOSIS — F3289 Other specified depressive episodes: Secondary | ICD-10-CM | POA: Insufficient documentation

## 2012-01-30 DIAGNOSIS — R739 Hyperglycemia, unspecified: Secondary | ICD-10-CM

## 2012-01-30 DIAGNOSIS — Z7982 Long term (current) use of aspirin: Secondary | ICD-10-CM | POA: Insufficient documentation

## 2012-01-30 LAB — BASIC METABOLIC PANEL
BUN: 22 mg/dL (ref 6–23)
CO2: 25 mEq/L (ref 19–32)
Chloride: 101 mEq/L (ref 96–112)
Creatinine, Ser: 0.73 mg/dL (ref 0.50–1.10)
Glucose, Bld: 399 mg/dL — ABNORMAL HIGH (ref 70–99)

## 2012-01-30 LAB — URINALYSIS, ROUTINE W REFLEX MICROSCOPIC
Glucose, UA: >1000 mg/dL — AB
Hgb urine dipstick: NEGATIVE
Ketones, ur: NEGATIVE mg/dL
Leukocytes, UA: NEGATIVE
pH: 6.5 (ref 5.0–8.0)

## 2012-01-30 LAB — CBC
HCT: 36.5 % (ref 36.0–46.0)
Hemoglobin: 12 g/dL (ref 12.0–15.0)
MCH: 29.8 pg (ref 26.0–34.0)
MCV: 90.6 fL (ref 78.0–100.0)
Platelets: 174 10*3/uL (ref 150–400)
RBC: 4.03 MIL/uL (ref 3.87–5.11)
WBC: 5.1 10*3/uL (ref 4.0–10.5)

## 2012-01-30 LAB — URINE MICROSCOPIC-ADD ON: Urine-Other: NONE SEEN

## 2012-01-30 LAB — GLUCOSE, CAPILLARY

## 2012-01-30 MED ORDER — SODIUM CHLORIDE 0.9 % IV BOLUS (SEPSIS)
1000.0000 mL | Freq: Once | INTRAVENOUS | Status: AC
  Administered 2012-01-30: 1000 mL via INTRAVENOUS

## 2012-01-30 MED ORDER — INSULIN ASPART 100 UNIT/ML ~~LOC~~ SOLN
4.0000 [IU] | Freq: Once | SUBCUTANEOUS | Status: AC
  Administered 2012-01-30: 4 [IU] via SUBCUTANEOUS
  Filled 2012-01-30: qty 1

## 2012-01-30 MED ORDER — METFORMIN HCL 500 MG PO TABS: 500.0000 mg | ORAL_TABLET | Freq: Two times a day (BID) | ORAL | Status: DC

## 2012-01-30 NOTE — Discharge Instructions (Signed)
Blood Sugar Monitoring, Adult GLUCOSE METERS FOR SELF-MONITORING OF BLOOD GLUCOSE  It is important to be able to correctly measure your blood sugar (glucose). You can use a blood glucose monitor (a small battery-operated device) to check your glucose level at any time. This allows you and your caregiver to monitor your diabetes and to determine how well your treatment plan is working. The process of monitoring your blood glucose with a glucose meter is called self-monitoring of blood glucose (SMBG). When people with diabetes control their blood sugar, they have better health. To test for glucose with a typical glucose meter, place the disposable strip in the meter. Then place a small sample of blood on the "test strip." The test strip is coated with chemicals that combine with glucose in blood. The meter measures how much glucose is present. The meter displays the glucose level as a number. Several new models can record and store a number of test results. Some models can connect to personal computers to store test results or print them out.  Newer meters are often easier to use than older models. Some meters allow you to get blood from places other than your fingertip. Some new models have automatic timing, error codes, signals, or barcode readers to help with proper adjustment (calibration). Some meters have a large display screen or spoken instructions for people with visual impairments.  INSTRUCTIONS FOR USING GLUCOSE METERS  Wash your hands with soap and warm water, or clean the area with alcohol. Dry your hands completely.   Prick the side of your fingertip with a lancet (a sharp-pointed tool used by hand).   Hold the hand down and gently milk the finger until a small drop of blood appears. Catch the blood with the test strip.   Follow the instructions for inserting the test strip and using the SMBG meter. Most meters require the meter to be turned on and the test strip to be inserted before  applying the blood sample.   Record the test result.   Read the instructions carefully for both the meter and the test strips that go with it. Meter instructions are found in the user manual. Keep this manual to help you solve any problems that may arise. Many meters use "error codes" when there is a problem with the meter, the test strip, or the blood sample on the strip. You will need the manual to understand these error codes and fix the problem.   New devices are available such as laser lancets and meters that can test blood taken from "alternative sites" of the body, other than fingertips. However, you should use standard fingertip testing if your glucose changes rapidly. Also, use standard testing if:   You have eaten, exercised, or taken insulin in the past 2 hours.   You think your glucose is low.   You tend to not feel symptoms of low blood glucose (hypoglycemia).   You are ill or under stress.   Clean the meter as directed by the manufacturer.   Test the meter for accuracy as directed by the manufacturer.   Take your meter with you to your caregiver's office. This way, you can test your glucose in front of your caregiver to make sure you are using the meter correctly. Your caregiver can also take a sample of blood to test using a routine lab method. If values on the glucose meter are close to the lab results, you and your caregiver will see that your meter is working well  and you are using good technique. Your caregiver will advise you about what to do if the results do not match.  FREQUENCY OF TESTING  Your caregiver will tell you how often you should check your blood glucose. This will depend on your type of diabetes, your current level of diabetes control, and your types of medicines. The following are general guidelines, but your care plan may be different. Record all your readings and the time of day you took them for review with your caregiver.   Diabetes type 1.   When you  are using insulin with good diabetic control (either multiple daily injections or via a pump), you should check your glucose 4 times a day.   If your diabetes is not well controlled, you may need to monitor more frequently, including before meals and 2 hours after meals, at bedtime, and occasionally between 2 a.m. and 3 a.m.   You should always check your glucose before a dose of insulin or before changing the rate on your insulin pump.   Diabetes type 2.   Guidelines for SMBG in diabetes type 2 are not as well defined.   If you are on insulin, follow the guidelines above.   If you are on medicines, but not insulin, and your glucose is not well controlled, you should test at least twice daily.   If you are not on insulin, and your diabetes is controlled with medicines or diet alone, you should test at least once daily, usually before breakfast.   A weekly profile will help your caregiver advise you on your care plan. The week before your visit, check your glucose before a meal and 2 hours after a meal at least daily. You may want to test before and after a different meal each day so you and your caregiver can tell how well controlled your blood sugars are throughout the course of a 24 hour period.   Gestational diabetes (diabetes during pregnancy).   Frequent testing is often necessary. Accurate timing is important.   If you are not on insulin, check your glucose 4 times a day. Check it before breakfast and 1 hour after the start of each meal.   If you are on insulin, check your glucose 6 times a day. Check it before each meal and 1 hour after the first bite of each meal.   General guidelines.   More frequent testing is required at the start of insulin treatment. Your caregiver will instruct you.   Test your glucose any time you suspect you have low blood sugar (hypoglycemia).   You should test more often when you change medicines, when you have unusual stress or illness, or in other  unusual circumstances.  OTHER THINGS TO KNOW ABOUT GLUCOSE METERS  Measurement Range. Most glucose meters are able to read glucose levels over a broad range of values from as low as 0 to as high as 600 mg/dL. If you get an extremely high or low reading from your meter, you should first confirm it with another reading. Report very high or very low readings to your caregiver.   Whole Blood Glucose versus Plasma Glucose. Some older home glucose meters measure glucose in your whole blood. In a lab or when using some newer home glucose meters, the glucose is measured in your plasma (one component of blood). The difference can be important. It is important for you and your caregiver to know whether your meter gives its results as "whole blood equivalent" or "plasma  equivalent."   Display of High and Low Glucose Values. Part of learning how to operate a meter is understanding what the meter results mean. Know how high and low glucose concentrations are displayed on your meter.   Factors that Affect Glucose Meter Performance. The accuracy of your test results depends on many factors and varies depending on the brand and type of meter. These factors include:   Low red blood cell count (anemia).   Substances in your blood (such as uric acid, vitamin C, and others).   Environmental factors (temperature, humidity, altitude).   Name-brand versus generic test strips.   Calibration. Make sure your meter is set up properly. It is a good idea to do a calibration test with a control solution recommended by the manufacturer of your meter whenever you begin using a fresh bottle of test strips. This will help verify the accuracy of your meter.   Improperly stored, expired, or defective test strips. Keep your strips in a dry place with the lid on.   Soiled meter.   Inadequate blood sample.  NEW TECHNOLOGIES FOR GLUCOSE TESTING Alternative site testing Some glucose meters allow testing blood from alternative  sites. These include the:  Upper arm.   Forearm.   Base of the thumb.   Thigh.  Sampling blood from alternative sites may be desirable. However, it may have some limitations. Blood in the fingertips show changes in glucose levels more quickly than blood in other parts of the body. This means that alternative site test results may be different from fingertip test results, not because of the meter's ability to test accurately, but because the actual glucose concentration can be different.  Continuous Glucose Monitoring Devices to measure your blood glucose continuously are available, and others are in development. These methods can be more expensive than self-monitoring with a glucose meter. However, it is uncertain how effective and reliable these devices are. Your caregiver will advise you if this approach makes sense for you. IF BLOOD SUGARS ARE CONTROLLED, PEOPLE WITH DIABETES REMAIN HEALTHIER.  SMBG is an important part of the treatment plan of patients with diabetes mellitus. Below are reasons for using SMBG:   It confirms that your glucose is at a specific, healthy level.   It detects hypoglycemia and severe hyperglycemia.   It allows you and your caregiver to make adjustments in response to changes in lifestyle for individuals requiring medicine.   It determines the need for starting insulin therapy in temporary diabetes that happens during pregnancy (gestational diabetes).  Document Released: 12/07/2003 Document Revised: 08/17/2011 Document Reviewed: 03/30/2011 [facility] Patient Information 2012 [place].Diabetes and Exercise Regular exercise is important and can help:   Control blood glucose (sugar).   Decrease blood pressure.    Control blood lipids (cholesterol, triglycerides).   Improve overall health.  BENEFITS FROM EXERCISE  Improved fitness.   Improved flexibility.   Improved endurance.   Increased bone density.   Weight control.   Increased muscle  strength.   Decreased body fat.   Improvement of the body's use of insulin, a hormone.   Increased insulin sensitivity.   Reduction of insulin needs.   Reduced stress and tension.   Helps you feel better.  People with diabetes who add exercise to their lifestyle gain additional benefits, including:  Weight loss.   Reduced appetite.   Improvement of the body's use of blood glucose.   Decreased risk factors for heart disease:   Lowering of cholesterol and triglycerides.   Raising the  level of good cholesterol (high-density lipoproteins, HDL).   Lowering blood sugar.   Decreased blood pressure.  TYPE 1 DIABETES AND EXERCISE  Exercise will usually lower your blood glucose.   If blood glucose is greater than 240 mg/dl, check urine ketones. If ketones are present, do not exercise.   Location of the insulin injection sites may need to be adjusted with exercise. Avoid injecting insulin into areas of the body that will be exercised. For example, avoid injecting insulin into:   The arms when playing tennis.   The legs when jogging. For more information, discuss this with your caregiver.   Keep a record of:   Food intake.   Type and amount of exercise.   Expected peak times of insulin action.   Blood glucose levels.  Do this before, during, and after exercise. Review your records with your caregiver. This will help you to develop guidelines for adjusting food intake and insulin amounts.  TYPE 2 DIABETES AND EXERCISE  Regular physical activity can help control blood glucose.   Exercise is important because it may:   Increase the body's sensitivity to insulin.   Improve blood glucose control.   Exercise reduces the risk of heart disease. It decreases serum cholesterol and triglycerides. It also lowers blood pressure.   Those who take insulin or oral hypoglycemic agents should watch for signs of hypoglycemia. These signs include dizziness, shaking, sweating, chills,  and confusion.   Body water is lost during exercise. It must be replaced. This will help to avoid loss of body fluids (dehydration) or heat stroke.  Be sure to talk to your caregiver before starting an exercise program to make sure it is safe for you. Remember, any activity is better than none.  Document Released: 02/24/2004 Document Revised: 08/16/2011 Document Reviewed: 06/10/2009 [facility] Patient Information 2012 [place].Complementary and Alternative Medical Therapies for Diabetes Complementary and alternative medicines are health care practices or products that are not always accepted as part of routine medicine. Complementary medicine is used along with routine medicine (medical therapy). Alternative medicine can sometimes be used instead of routine medicine. Some people use these methods to treat diabetes. While some of these therapies may be effective, others may not be. Some may even be harmful. Patients using these methods need to tell their caregiver. It is important to let your caregivers know what you are doing. Some of these therapies are discussed below. For more information, talk with your caregiver. THERAPIES Acupuncture Acupuncture is done by a professional who inserts needles into certain points on the skin. Some scientists believe that this triggers the release of the body's natural painkillers. It has been shown to relieve long-term (chronic) pain. This may help patients with painful nerve damage caused by diabetes. Biofeedback Biofeedback helps a person become more aware of the body's response to pain. It also helps you learn to deal with the pain. This alternative therapy focuses on relaxation and stress-reduction techniques. Thinking of peaceful mental images (guided imagery) is one technique. Some people believe these images can ease their condition. MEDICATIONS Chromium Several studies report that chromium supplements may improve diabetes control. Chromium helps  insulin improve its action. Research is not yet certain. Supplements have not been recommended or approved. Caution is needed if you have kidney (renal) problems. Ginseng There are several types of ginseng plants. American ginseng is used for diabetes studies. Those studies have shown some glucose-lowering effects. Those effects have been seen with fasting and after-meal blood glucose levels. They have also  been seen in A1c levels (average blood glucose levels over a 80-month period). More long-term studies are needed before recommendations for use of ginseng can be made. Magnesium Experts have studied the relationship between magnesium and diabetes for many years. But it is not yet fully understood. Studies suggest that a low amount of magnesium may make blood glucose control worse in type 2 diabetes. Research also shows that a low amount may contribute to certain diabetes complications. One study showed that people who consume more magnesium had less risk of type 2 diabetes. Eating whole grains, nuts, and green leafy vegetables raises the magnesium level. Vanadium Vanadium is a compound found in tiny amounts in plants and animals. Early studies showed that vanadium improved blood glucose levels in animals with type 1 and type 2 diabetes. One study found that when given vanadium, those with diabetes were able to decrease their insulin dosage. Researchers still need to learn how it works in the body to discover any side effects, and to find safe dosages. Cinnamon There have been a couple of studies that seem to indicate cinnamon decreases insulin resistance and increases insulin production. By doing so, it may lower blood glucose. Exact doses are unknown, but it may work best when used in combination with other diabetes medicines. Document Released: 10/01/2007 Document Revised: 08/16/2011 Document Reviewed: 10/14/2009 [facility] Patient Information 2012 [place].

## 2012-01-30 NOTE — ED Notes (Signed)
Pt states feels ok, was "sent over by MD yesterday but I waited til today". Pt states has been to ER several times for this problem, states CBG past 2 days ranging in hi 200 to 400's. Denies c/o pain but states has a cough and sputum white colored since Saturday.

## 2012-01-30 NOTE — ED Provider Notes (Addendum)
History     CSN: [id number]  Arrival date & time 01/30/12  0911   First MD Initiated Contact with Patient 01/30/12 [phone]      Chief Complaint  Patient presents with  . Hyperglycemia   patient presents for her persistent chronic hyperglycemia. She states blood sugars ranging from 200 to 400s really over many weeks. She's had minimal nausea no vomiting. She denies any excessive thirst or any polyuria. She has had no dizziness or syncope. She has had minimal dry cough. No fever or chest pain. No difficulty breathing. The family states she has not been following a strict diabetic diet in the room. The patient has no significant complaints. She states her doctor told her to come in yesterday. However, she did not come in yesterday and waited until today. The family. Will thought her insulin may need to be adjusted. She has not been out of her medications. The patient states she normally was walking in the past, but stopped exercising recently  (Consider location/radiation/quality/duration/timing/severity/associated sxs/prior treatment) HPI  Past Medical History  Diagnosis Date  . Hypertension   . Diabetes mellitus   . Hypercholesteremia   . Peptic ulcer   . Arthritis   . Insomnia   . Depression     Past Surgical History  Procedure Date  . Abdominal hysterectomy     No family history on file.  History  Substance Use Topics  . Smoking status: Never Smoker   . Smokeless tobacco: Current User    Types: Snuff  . Alcohol Use: No    OB History    Grav Para Term Preterm Abortions TAB SAB Ect Mult Living                  Review of Systems  All other systems reviewed and are negative.    Allergies  Review of patient's allergies indicates no known allergies.  Home Medications   Current Outpatient Rx  Name Route Sig Dispense Refill  . ASPIRIN EC 81 MG PO TBEC Oral Take 81 mg by mouth daily.      . ATORVASTATIN CALCIUM 40 MG PO TABS Oral Take 40 mg by mouth daily.      Marland Kitchen  CALCIUM CARBONATE-VITAMIN D 600-400 MG-UNIT PO TABS Oral Take 1 tablet by mouth daily.      . CYCLOSPORINE 0.05 % OP EMUL Both Eyes Place 1 drop into both eyes 2 (two) times daily.      Marland Kitchen GLIMEPIRIDE 4 MG PO TABS Oral Take 4 mg by mouth 2 (two) times daily with a meal.      . LISINOPRIL-HYDROCHLOROTHIAZIDE 10-12.5 MG PO TABS Oral Take 1 tablet by mouth daily.      Marland Kitchen NIFEDIPINE ER OSMOTIC 60 MG PO TB24 Oral Take 60 mg by mouth daily.      Marland Kitchen OMEPRAZOLE 20 MG PO CPDR Oral Take 20 mg by mouth 2 (two) times daily.      . SERTRALINE HCL 100 MG PO TABS Oral Take 100 mg by mouth every morning.      . TURMERIC 450 MG PO CAPS Oral Take 1 capsule by mouth daily.      Marland Kitchen VITAMIN C 500 MG PO TABS Oral Take 500 mg by mouth daily.      Marland Kitchen ZOLPIDEM TARTRATE 10 MG PO TABS Oral Take 5 mg by mouth at bedtime as needed. She takes four nights a week for sleep.       BP 119/65  Pulse 59  Temp(Src) 97.9  F (36.6 C) (Oral)  Resp 16  SpO2 96%  Physical Exam  Nursing note and vitals reviewed. Constitutional: She is oriented to person, place, and time. She appears well-developed and well-nourished. No distress.       Calm cooperative, and comfortable  HENT:  Head: Normocephalic and atraumatic.  Eyes: Conjunctivae and EOM are normal. Pupils are equal, round, and reactive to light.  Neck: Neck supple.  Cardiovascular: Normal rate and regular rhythm.  Exam reveals no gallop and no friction rub.   No murmur heard. Pulmonary/Chest: Breath sounds normal. She has no wheezes. She has no rales. She exhibits no tenderness.  Abdominal: Soft. Bowel sounds are normal. She exhibits no distension. There is no tenderness. There is no rebound and no guarding.  Musculoskeletal: Normal range of motion.  Neurological: She is alert and oriented to person, place, and time. No cranial nerve deficit. Coordination normal.  Skin: Skin is warm and dry. No rash noted.  Psychiatric: She has a normal mood and affect.    ED Course    Procedures (including critical care time)  Labs Reviewed  GLUCOSE, CAPILLARY - Abnormal; Notable for the following:    Glucose-Capillary 391 (*)    All other components within normal limits  BASIC METABOLIC PANEL - Abnormal; Notable for the following:    Glucose, Bld 399 (*)    GFR calc non Af Amer 85 (*)    All other components within normal limits  GLUCOSE, CAPILLARY - Abnormal; Notable for the following:    Glucose-Capillary 310 (*)    All other components within normal limits  CBC  URINALYSIS, ROUTINE W REFLEX MICROSCOPIC   No results found.   No diagnosis found.    MDM  Patient is seen and examined, initial history and physical is completed. Evaluation initiated      Results for orders placed during the hospital encounter of 01/30/12  GLUCOSE, CAPILLARY      Component Value Range   Glucose-Capillary 391 (*) 70 - 99 (mg/dL)  CBC      Component Value Range   WBC 5.1  4.0 - 10.5 (K/uL)   RBC 4.03  3.87 - 5.11 (MIL/uL)   Hemoglobin 12.0  12.0 - 15.0 (g/dL)   HCT 36.5  36.0 - 46.0 (%)   MCV 90.6  78.0 - 100.0 (fL)   MCH 29.8  26.0 - 34.0 (pg)   MCHC 32.9  30.0 - 36.0 (g/dL)   RDW 13.1  11.5 - 15.5 (%)   Platelets 174  150 - 400 (K/uL)  BASIC METABOLIC PANEL      Component Value Range   Sodium 136  135 - 145 (mEq/L)   Potassium 4.1  3.5 - 5.1 (mEq/L)   Chloride 101  96 - 112 (mEq/L)   CO2 25  19 - 32 (mEq/L)   Glucose, Bld 399 (*) 70 - 99 (mg/dL)   BUN 22  6 - 23 (mg/dL)   Creatinine, Ser 0.73  0.50 - 1.10 (mg/dL)   Calcium 9.2  8.4 - 10.5 (mg/dL)   GFR calc non Af Amer 85 (*) >90 (mL/min)   GFR calc Af Amer >90  >90 (mL/min)  URINALYSIS, ROUTINE W REFLEX MICROSCOPIC      Component Value Range   Color, Urine YELLOW  YELLOW    APPearance CLEAR  CLEAR    Specific Gravity, Urine 1.019  1.005 - 1.030    pH 6.5  5.0 - 8.0    Glucose, UA >1000 (*) NEGATIVE (mg/dL)  Hgb urine dipstick NEGATIVE  NEGATIVE    Bilirubin Urine NEGATIVE  NEGATIVE    Ketones, ur  NEGATIVE  NEGATIVE (mg/dL)   Protein, ur NEGATIVE  NEGATIVE (mg/dL)   Urobilinogen, UA 0.2  0.0 - 1.0 (mg/dL)   Nitrite NEGATIVE  NEGATIVE    Leukocytes, UA NEGATIVE  NEGATIVE   GLUCOSE, CAPILLARY      Component Value Range   Glucose-Capillary 310 (*) 70 - 99 (mg/dL)   Comment 1 Documented in Chart     Comment 2 Notify RN    URINE MICROSCOPIC-ADD ON      Component Value Range   Urine-Other       Value: NO FORMED ELEMENTS SEEN ON URINE MICROSCOPIC EXAMINATION   No results found.  The patient has received IV hydration. Urine was negative for ketones. CO2 was normal, no sign of DKA. Blood sugar was in the elevated range, height 300s. This is likely chronic. No sign of DKA   Patient will be stable for discharge home. She was told to speak with her doctor about adjusting her insulin dosage;   Also instructed to get plenty of water and to follow a strict diabetic diet  Louise Rawson A. Lauris Poag, MD 01/30/12 1256   1:06 PM  Long talk with patient and family in regard to her long-term plan for her uncontrolled diabetes and hyperglycemia. Currently she is only on one medication of glimepiride 4 mg per day as discussed with the hospitalist, recommending metformin 500 mg by mouth twice a day in addition to the glimepiride. However, she will need a hemoglobin A1c as an outpatient and may need to go on insulin. Patient was told to hear to a strict diabetic diet and followup with her primary care physician within the next one week  Mervyn Pflaum A. Lauris Poag, MD 01/30/12 [id number]

## 2012-09-17 ENCOUNTER — Other Ambulatory Visit (HOSPITAL_COMMUNITY): Payer: Self-pay | Admitting: Internal Medicine

## 2012-09-17 DIAGNOSIS — Z1231 Encounter for screening mammogram for malignant neoplasm of breast: Secondary | ICD-10-CM

## 2012-10-03 ENCOUNTER — Ambulatory Visit (HOSPITAL_COMMUNITY)
Admission: RE | Admit: 2012-10-03 | Discharge: 2012-10-03 | Disposition: A | Discharge status: Home / Self Care | Payer: PRIVATE HEALTH INSURANCE | Source: Ambulatory Visit | Attending: Internal Medicine | Admitting: Internal Medicine

## 2012-10-03 DIAGNOSIS — Z1231 Encounter for screening mammogram for malignant neoplasm of breast: Secondary | ICD-10-CM

## 2013-08-07 ENCOUNTER — Other Ambulatory Visit (HOSPITAL_COMMUNITY): Payer: Self-pay | Admitting: Internal Medicine

## 2013-08-07 DIAGNOSIS — Z1231 Encounter for screening mammogram for malignant neoplasm of breast: Secondary | ICD-10-CM

## 2013-10-06 ENCOUNTER — Ambulatory Visit (HOSPITAL_COMMUNITY)
Admission: RE | Admit: 2013-10-06 | Discharge: 2013-10-06 | Disposition: A | Discharge status: Home / Self Care | Payer: PRIVATE HEALTH INSURANCE | Source: Ambulatory Visit | Attending: Internal Medicine | Admitting: Internal Medicine

## 2013-10-06 DIAGNOSIS — Z1231 Encounter for screening mammogram for malignant neoplasm of breast: Secondary | ICD-10-CM | POA: Insufficient documentation

## 2013-12-18 HISTORY — PX: BREAST LUMPECTOMY: SHX2

## 2014-09-08 ENCOUNTER — Other Ambulatory Visit (HOSPITAL_COMMUNITY): Payer: Self-pay | Admitting: Internal Medicine

## 2014-09-08 DIAGNOSIS — Z1231 Encounter for screening mammogram for malignant neoplasm of breast: Secondary | ICD-10-CM

## 2014-09-17 ENCOUNTER — Ambulatory Visit (HOSPITAL_COMMUNITY): Payer: PRIVATE HEALTH INSURANCE

## 2014-10-08 ENCOUNTER — Ambulatory Visit (HOSPITAL_COMMUNITY)
Admission: RE | Admit: 2014-10-08 | Discharge: 2014-10-08 | Disposition: A | Discharge status: Home / Self Care | Payer: PRIVATE HEALTH INSURANCE | Source: Ambulatory Visit | Attending: Internal Medicine | Admitting: Internal Medicine

## 2014-10-08 DIAGNOSIS — Z1231 Encounter for screening mammogram for malignant neoplasm of breast: Secondary | ICD-10-CM | POA: Diagnosis not present

## 2014-10-13 ENCOUNTER — Other Ambulatory Visit: Payer: Self-pay | Admitting: Internal Medicine

## 2014-10-13 DIAGNOSIS — R928 Other abnormal and inconclusive findings on diagnostic imaging of breast: Secondary | ICD-10-CM

## 2014-10-26 ENCOUNTER — Encounter (INDEPENDENT_AMBULATORY_CARE_PROVIDER_SITE_OTHER): Payer: Self-pay

## 2014-10-26 ENCOUNTER — Other Ambulatory Visit: Payer: Self-pay | Admitting: Internal Medicine

## 2014-10-26 ENCOUNTER — Ambulatory Visit
Admission: RE | Admit: 2014-10-26 | Discharge: 2014-10-26 | Disposition: A | Discharge status: Home / Self Care | Payer: PRIVATE HEALTH INSURANCE | Source: Ambulatory Visit | Attending: Internal Medicine | Admitting: Internal Medicine

## 2014-10-26 DIAGNOSIS — R928 Other abnormal and inconclusive findings on diagnostic imaging of breast: Secondary | ICD-10-CM

## 2014-11-04 ENCOUNTER — Other Ambulatory Visit: Payer: Self-pay | Admitting: Internal Medicine

## 2014-11-04 ENCOUNTER — Ambulatory Visit
Admission: RE | Admit: 2014-11-04 | Discharge: 2014-11-04 | Disposition: A | Discharge status: Home / Self Care | Payer: PRIVATE HEALTH INSURANCE | Source: Ambulatory Visit | Attending: Internal Medicine | Admitting: Internal Medicine

## 2014-11-04 DIAGNOSIS — R928 Other abnormal and inconclusive findings on diagnostic imaging of breast: Secondary | ICD-10-CM

## 2014-11-05 ENCOUNTER — Other Ambulatory Visit: Payer: Self-pay | Admitting: Internal Medicine

## 2014-11-05 DIAGNOSIS — C50911 Malignant neoplasm of unspecified site of right female breast: Secondary | ICD-10-CM

## 2014-11-06 ENCOUNTER — Telehealth: Payer: Self-pay | Admitting: *Deleted

## 2014-11-06 ENCOUNTER — Encounter: Payer: Self-pay | Admitting: *Deleted

## 2014-11-06 DIAGNOSIS — C50411 Malignant neoplasm of upper-outer quadrant of right female breast: Secondary | ICD-10-CM

## 2014-11-06 HISTORY — DX: Malignant neoplasm of upper-outer quadrant of right female breast: C50.411

## 2014-11-06 NOTE — Telephone Encounter (Signed)
Confirmed [facility] for 11/11/14 at 0830 .  Instructions and contact information given.

## 2014-11-10 ENCOUNTER — Ambulatory Visit
Admission: RE | Admit: 2014-11-10 | Discharge: 2014-11-10 | Disposition: A | Discharge status: Home / Self Care | Payer: PRIVATE HEALTH INSURANCE | Source: Ambulatory Visit | Attending: Internal Medicine | Admitting: Internal Medicine

## 2014-11-10 DIAGNOSIS — C50911 Malignant neoplasm of unspecified site of right female breast: Secondary | ICD-10-CM

## 2014-11-10 MED ORDER — GADOBENATE DIMEGLUMINE 529 MG/ML IV SOLN
17.0000 mL | Freq: Once | INTRAVENOUS | Status: AC | PRN
  Administered 2014-11-10: 17 mL via INTRAVENOUS

## 2014-11-10 NOTE — Progress Notes (Deleted)
[facility] NOTE  Patient Care Team: Philis Fendt, MD as PCP - General (Internal Medicine) Autumn Messing III, MD as Consulting Physician (General Surgery) Truitt Merle, MD as Consulting Physician (Hematology) Thea Silversmith, MD as Consulting Physician (Radiation Oncology)  CHIEF COMPLAINTS/PURPOSE OF CONSULTATION:  Breast cancer   HISTORY OF PRESENTING ILLNESS:  Erica Whitney 73 y.o. female is here because of ***  MEDICAL HISTORY:  Past Medical History  Diagnosis Date  . Hypertension   . Diabetes mellitus   . Hypercholesteremia   . Peptic ulcer   . Arthritis   . Insomnia   . Depression   . 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 Abdominal hysterectomy    Lumber spine surgery 15 years   SOCIAL HISTORY: History   Social History  . Marital Status: Single    Spouse Name: N/A    Number of Children: 4, 2 girls, 2 boys, age 61-52   . Years of Education: N/A  She lives alone, her daughter lives close to her   Occupational History   Cleaning     Social History Main Topics  . Smoking status: Never Smoker   . Smokeless tobacco: Current User    Types: Snuff  . Alcohol Use: No  . Drug Use: No  . Sexual Activity: Not on file   Other Topics Concern  . Not on file   Social History Narrative    FAMILY HISTORY: Mother had lung cancer at age of 55 Sister had breast ca at age of 35 Brother had lung cancer at 70  ALLERGIES:  has No Known Allergies.  MEDICATIONS:  Current Outpatient Prescriptions  Medication Sig Dispense Refill  . aspirin EC 81 MG tablet Take 81 mg by mouth daily.      Marland Kitchen atorvastatin (LIPITOR) 40 MG tablet Take 40 mg by mouth daily.      . Calcium Carbonate-Vitamin D (CALCIUM 600+D) 600-400 MG-UNIT per tablet Take 1 tablet by mouth daily.      . cycloSPORINE (RESTASIS)  0.05 % ophthalmic emulsion Place 1 drop into both eyes 2 (two) times daily.      Marland Kitchen glimepiride (AMARYL) 4 MG tablet Take 4 mg by mouth 2 (two) times daily with a meal.      . lisinopril-hydrochlorothiazide (PRINZIDE,ZESTORETIC) 10-12.5 MG per tablet Take 1 tablet by mouth daily.      . metFORMIN (GLUCOPHAGE) 500 MG tablet Take 1 tablet (500 mg total) by mouth 2 (two) times daily with a meal. 20 tablet 0  . NIFEdipine (PROCARDIA XL/ADALAT-CC) 60 MG 24 hr tablet Take 60 mg by mouth daily.      Marland Kitchen omeprazole (PRILOSEC) 20 MG capsule Take 20 mg by mouth 2 (two) times daily.      . sertraline (ZOLOFT) 100 MG tablet Take 100 mg by mouth every morning.      . Turmeric 450 MG CAPS Take 1 capsule by mouth daily.      . vitamin C (ASCORBIC ACID) 500 MG tablet Take 500 mg by mouth daily.      Marland Kitchen zolpidem (AMBIEN) 10 MG tablet Take 5 mg by mouth at bedtime as needed. She takes four nights a week for sleep.      No current facility-administered medications for this visit.    REVIEW OF SYSTEMS:   Constitutional: Denies fevers, chills  or abnormal night sweats Eyes: Denies blurriness of vision, double vision or watery eyes Ears, nose, mouth, throat, and face: Denies mucositis or sore throat Respiratory: Denies cough, dyspnea or wheezes Cardiovascular: Denies palpitation, chest discomfort or lower extremity swelling Gastrointestinal:  Denies nausea, heartburn or change in bowel habits Skin: Denies abnormal skin rashes Lymphatics: Denies new lymphadenopathy or easy bruising Neurological:Denies numbness, tingling or new weaknesses Behavioral/Psych: Mood is stable, no new changes  All other systems were reviewed with the patient and are negative. (+) joints pain in knees and arms   PHYSICAL EXAMINATION: ECOG PERFORMANCE STATUS: {CHL ONC ECOG [id number]  Filed Vitals:   11/11/14 0906  BP: 157/79  Pulse: 70  Temp: 98.2 F (36.8 C)  Resp: 18   Filed Weights   11/11/14 0906  Weight: 182 lb (82.555  kg)    GENERAL:alert, no distress and comfortable SKIN: skin color, texture, turgor are normal, no rashes or significant lesions EYES: normal, conjunctiva are pink and non-injected, sclera clear OROPHARYNX:no exudate, no erythema and lips, buccal mucosa, and tongue normal  NECK: supple, thyroid normal size, non-tender, without nodularity LYMPH:  no palpable lymphadenopathy in the cervical, axillary or inguinal LUNGS: clear to auscultation and percussion with normal breathing effort HEART: regular rate & rhythm and no murmurs and no lower extremity edema ABDOMEN:abdomen soft, non-tender and normal bowel sounds Musculoskeletal:no cyanosis of digits and no clubbing  PSYCH: alert & oriented x 3 with fluent speech NEURO: no focal motor/sensory deficits  LABORATORY DATA:  I have reviewed the data as listed Lab Results  Component Value Date   WBC 7.3 11/11/2014   HGB 11.3* 11/11/2014   HCT 35.7 11/11/2014   MCV 94.7 11/11/2014   PLT 200 11/11/2014    Recent Labs  11/11/14 0838  NA 141  K 4.5  CO2 24  GLUCOSE 136  BUN 32.2*  CREATININE 1.0  CALCIUM 10.5*  PROT 7.8  ALBUMIN 3.9  AST 12  ALT 12  ALKPHOS 67  BILITOT 0.43     PATHOLOGY REPORT Diagnosis 11/04/2014 Breast, right, needle core biopsy, mass - INVASIVE DUCTAL CARCINOMA, 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 - CALCIFICATIONS PRESENT. Microscopic Comment Although the grade of tumor is best assessed at resection, with these biopsies, the invasive tumor is grade II and in situ carcinoma grade I.  RADIOGRAPHIC STUDIES: I have personally reviewed the radiological images as listed and agreed with the findings in the report.  Mr Breast Bilateral W Wo Contrast 11/10/2014    FINDINGS:  Breast composition: b.  Scattered fibroglandular tissue.  Background parenchymal enhancement: Mild to moderate  Right breast: There is considerable post biopsy change related to the recent right breast ultrasound-guided core biopsy.  The clip is located 2.3 cm posterior and slightly lateral with respect to the small irregular enhancing mass located within the upper outer quadrant of the right breast (middle 1/3). This measures 7 x 7 x 5 mm in size and is associated with a mixture of plateau and washout enhancement kinetics. There are no additional areas of worrisome enhancement within the right breast.  Left breast: No mass or abnormal enhancement.  Lymph nodes: No abnormal appearing lymph nodes.  Ancillary findings:  None.    IMPRESSION:  The clip is located 2.3 cm posterior and slightly lateral with respect of the small (7 mm) irregular enhancing mass located within the upper outer quadrant of the right breast (middle 1/3). Post biopsy change within the right breast as discussed above. No additional findings and  no evidence for adenopathy.    Mm Digital Diagnostic Unilat R 10/26/2014 IMPRESSION:  5 mm mass at 10 o'clock position right breast is suspicious for malignancy.     US Breast Ltd Uni Right Inc Axilla 10/26/2014   IMPRESSION:  5 mm mass at 10 o'clock position right breast is suspicious for malignancy.   RECOMMENDATION: Ultrasound-guided biopsy is recommended and has been scheduled for November 04, 2014 at 1 o'clock p.m.    ASSESSMENT & PLAN:  *** No orders of the defined types were placed in this encounter.    All questions were answered. The patient knows to call the clinic with any problems, questions or concerns. I spent {CHL ONC TIME VISIT - [id number] counseling the patient face to face. The total time spent in the appointment was {CHL ONC TIME VISIT - [id number] and more than 50% was on counseling.     Truitt Merle, MD 11/11/2014 9:30 AM

## 2014-11-11 ENCOUNTER — Other Ambulatory Visit (INDEPENDENT_AMBULATORY_CARE_PROVIDER_SITE_OTHER): Payer: Self-pay | Admitting: General Surgery

## 2014-11-11 ENCOUNTER — Encounter: Payer: Self-pay | Admitting: Hematology

## 2014-11-11 ENCOUNTER — Encounter: Payer: Self-pay | Admitting: *Deleted

## 2014-11-11 ENCOUNTER — Other Ambulatory Visit (HOSPITAL_BASED_OUTPATIENT_CLINIC_OR_DEPARTMENT_OTHER): Payer: PRIVATE HEALTH INSURANCE

## 2014-11-11 ENCOUNTER — Ambulatory Visit: Payer: PRIVATE HEALTH INSURANCE | Attending: General Surgery | Admitting: Physical Therapy

## 2014-11-11 ENCOUNTER — Ambulatory Visit (HOSPITAL_BASED_OUTPATIENT_CLINIC_OR_DEPARTMENT_OTHER): Payer: PRIVATE HEALTH INSURANCE | Admitting: Hematology

## 2014-11-11 ENCOUNTER — Ambulatory Visit: Payer: PRIVATE HEALTH INSURANCE

## 2014-11-11 ENCOUNTER — Encounter: Payer: Self-pay | Admitting: Physical Therapy

## 2014-11-11 ENCOUNTER — Ambulatory Visit
Admission: RE | Admit: 2014-11-11 | Discharge: 2014-11-11 | Disposition: A | Discharge status: Home / Self Care | Payer: PRIVATE HEALTH INSURANCE | Source: Ambulatory Visit | Attending: Radiation Oncology | Admitting: Radiation Oncology

## 2014-11-11 VITALS — BP 157/79 | HR 70 | Temp 98.2°F | Resp 18 | Ht 63.0 in | Wt 182.0 lb

## 2014-11-11 DIAGNOSIS — C50411 Malignant neoplasm of upper-outer quadrant of right female breast: Secondary | ICD-10-CM

## 2014-11-11 DIAGNOSIS — M439 Deforming dorsopathy, unspecified: Secondary | ICD-10-CM

## 2014-11-11 DIAGNOSIS — C50911 Malignant neoplasm of unspecified site of right female breast: Secondary | ICD-10-CM

## 2014-11-11 DIAGNOSIS — Z170 Estrogen receptor positive status [ER+]: Secondary | ICD-10-CM

## 2014-11-11 DIAGNOSIS — Z5189 Encounter for other specified aftercare: Secondary | ICD-10-CM | POA: Diagnosis not present

## 2014-11-11 DIAGNOSIS — M7582 Other shoulder lesions, left shoulder: Secondary | ICD-10-CM | POA: Insufficient documentation

## 2014-11-11 DIAGNOSIS — M25612 Stiffness of left shoulder, not elsewhere classified: Secondary | ICD-10-CM

## 2014-11-11 LAB — COMPREHENSIVE METABOLIC PANEL (CC13)
ALT: 12 U/L (ref 0–55)
ANION GAP: 10 meq/L (ref 3–11)
AST: 12 U/L (ref 5–34)
Albumin: 3.9 g/dL (ref 3.5–5.0)
Alkaline Phosphatase: 67 U/L (ref 40–150)
BILIRUBIN TOTAL: 0.43 mg/dL (ref 0.20–1.20)
BUN: 32.2 mg/dL — ABNORMAL HIGH (ref 7.0–26.0)
CO2: 24 mEq/L (ref 22–29)
Calcium: 10.5 mg/dL — ABNORMAL HIGH (ref 8.4–10.4)
Chloride: 107 mEq/L (ref 98–109)
Creatinine: 1 mg/dL (ref 0.6–1.1)
Glucose: 136 mg/dl (ref 70–140)
Potassium: 4.5 mEq/L (ref 3.5–5.1)
SODIUM: 141 meq/L (ref 136–145)
TOTAL PROTEIN: 7.8 g/dL (ref 6.4–8.3)

## 2014-11-11 LAB — CBC WITH DIFFERENTIAL/PLATELET
BASO%: 0.3 % (ref 0.0–2.0)
Basophils Absolute: 0 10*3/uL (ref 0.0–0.1)
EOS%: 1.4 % (ref 0.0–7.0)
Eosinophils Absolute: 0.1 10*3/uL (ref 0.0–0.5)
HEMATOCRIT: 35.7 % (ref 34.8–46.6)
HGB: 11.3 g/dL — ABNORMAL LOW (ref 11.6–15.9)
LYMPH#: 1.9 10*3/uL (ref 0.9–3.3)
LYMPH%: 25.2 % (ref 14.0–49.7)
MCH: 30 pg (ref 25.1–34.0)
MCHC: 31.7 g/dL (ref 31.5–36.0)
MCV: 94.7 fL (ref 79.5–101.0)
MONO#: 0.7 10*3/uL (ref 0.1–0.9)
MONO%: 9.1 % (ref 0.0–14.0)
NEUT#: 4.7 10*3/uL (ref 1.5–6.5)
NEUT%: 64 % (ref 38.4–76.8)
PLATELETS: 200 10*3/uL (ref 145–400)
RBC: 3.77 10*6/uL (ref 3.70–5.45)
RDW: 13.8 % (ref 11.2–14.5)
WBC: 7.3 10*3/uL (ref 3.9–10.3)

## 2014-11-11 NOTE — Therapy (Signed)
Physical Therapy Evaluation  Patient Details  Name: Erica Whitney MRN: [id number] Date of Birth: 09/03/1941  Encounter Date: 11/11/2014      PT End of Session - 11/11/14 1158    Visit Number 1   Number of Visits 1   PT Start Time 1030   PT Stop Time 1057   PT Time Calculation (min) 27 min   Activity Tolerance Patient tolerated treatment well   Behavior During Therapy [facility] for tasks assessed/performed      Past Medical History  Diagnosis Date  . Hypertension   . Diabetes mellitus   . Hypercholesteremia   . Peptic ulcer   . Arthritis   . Insomnia   . Depression   . 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 Abdominal hysterectomy    . Back surgery      There were no vitals taken for this visit.  Visit Diagnosis:  Carcinoma of upper-outer quadrant of right female breast - Plan: PT plan of care cert/re-cert  Postural deformity - Plan: PT plan of care cert/re-cert  Decreased range of motion of shoulder, left - Plan: PT plan of care cert/re-cert      Subjective Assessment - 11/11/14 1143    Symptoms Patient was seen today at the breast multidisciplinary clinic for a new diagnosis of right breast cancer.   Pertinent History Diagnosed with right ER/PR positive, HER2 negative breast cancer with a Ki67 of 15%.     Patient Stated Goals Learn a post op shoulder ROM home exercise program and lymphedema risk reduction practices.   Currently in Pain? Yes   Pain Score 6    Pain Location Back  Also has same pain in knees and shoulders from arthritis   Pain Orientation Right;Left   Pain Descriptors / Indicators Aching;Throbbing   Pain Type Chronic pain   Pain Onset More than a month ago   Pain Frequency Intermittent   Aggravating Factors  Worse at night   Pain Relieving Factors Better in the morning   Effect of Pain on Daily Activities Limits her functionally at times but mostly she  reports she pushes through her pain.   Multiple Pain Sites No          [facility] PT Assessment - 11/11/14 0001    Assessment   Medical Diagnosis Right breast cancer   Onset Date 11/04/14   Precautions   Precautions Fall;Other (comment)  Active cancer; dementia   Restrictions   Weight Bearing Restrictions No   Balance Screen   Has the patient fallen in the past 6 months Yes  She would like to begin PT for balance after cancer treatmen   How many times? 3   Has the patient had a decrease in activity level because of a fear of falling?  No   Is the patient reluctant to leave their home because of a fear of falling?  No   Home Environment   Living Enviornment Private residence   Living Arrangements Alone  Daughter is close by   Prior Function   Level of Independence Independent with basic ADLs   Vocation Retired   IT consultant   Overall Cognitive Status History of cognitive impairments - at baseline   Posture/Postural Control   Posture/Postural Control Postural limitations   Postural Limitations Rounded Shoulders;Forward head   AROM   Right Shoulder Extension 65 Degrees   Right Shoulder Flexion 130 Degrees   Right  Shoulder ABduction 121 Degrees   Right Shoulder Internal Rotation 73 Degrees   Right Shoulder External Rotation 60 Degrees   Left Shoulder Extension 56 Degrees   Left Shoulder Flexion 92 Degrees   Left Shoulder ABduction 83 Degrees   Left Shoulder Internal Rotation 58 Degrees   Left Shoulder External Rotation 50 Degrees   Balance   Balance Assessed No   Standardized Balance Assessment   Standardized Balance Assessment --  Would benefit from PT after cancer treatment for full assess            PT Education - 11-23-14 1156    Education provided Yes   Education Details Post op shoulder ROM home exercise program; lymphedema risk reduction practices   Person(s) Educated Patient   Methods Explanation;Demonstration;Handout;Verbal cues   Comprehension Verbalized  understanding;Returned demonstration           Plan - 23-Nov-2014 1159    Clinical Impression Statement Patient was seen today at the breast multidisciplinary clinic for a baseline evaluation for her right breast cancer.  She is planning to have a right lumpectomy and sentinel node biopsy followed by possible radiation and anti-estrogen therapy.   Pt will benefit from skilled therapeutic intervention in order to improve on the following deficits Decreased strength;Impaired UE functional use;Decreased knowledge of precautions;Increased edema;Decreased range of motion  if needed after surgery.   Rehab Potential Good   Clinical Impairments Affecting Rehab Potential Cognition   PT Frequency One time visit   PT Treatment/Interventions Therapeutic exercise;Patient/family education   Consulted and Agree with Plan of Care Patient;Family member/caregiver   Family Member Consulted Daughter          G-Codes - Nov 23, 2014 11/19/07    Functional Assessment Tool Used Clinical Judgement   Functional Limitation Other PT primary   Other PT Primary Current Status [phone]) At least 20 percent but less than 40 percent impaired, limited or restricted   Other PT Primary Goal Status (N2355) At least 20 percent but less than 40 percent impaired, limited or restricted   Other PT Primary Discharge Status (D3220) At least 20 percent but less than 40 percent impaired, limited or restricted      Problem List Patient Active Problem List   Diagnosis Date Noted  . Breast cancer of upper-outer quadrant of right female breast Nov 18, 2014         LYMPHEDEMA/ONCOLOGY QUESTIONNAIRE - Nov 23, 2014 1153    Type   Cancer Type Right breast cancer   Lymphedema Assessments   Lymphedema Assessments Upper extremities   Right Upper Extremity Lymphedema   10 cm Proximal to Olecranon Process 30 cm   Olecranon Process 25.5 cm   10 cm Proximal to Ulnar Styloid Process 20.8 cm   Just Proximal to Ulnar Styloid Process 16 cm   Across  Hand at [facility] 20 cm   At [place] of 2nd Digit 6.2 cm   Left Upper Extremity Lymphedema   10 cm Proximal to Olecranon Process 32 cm   Olecranon Process 25.1 cm   10 cm Proximal to Ulnar Styloid Process 19.8 cm   Just Proximal to Ulnar Styloid Process 15.5 cm   Across Hand at [facility] 18.9 cm   At [place] of 2nd Digit 6.1 cm           Breast Clinic Goals - 11-23-14 1207/11/19    Patient will be able to verbalize understanding of pertinent lymphedema risk reduction practices relevant to her diagnosis specifically related to skin care.   Time  1   Period Days   Status New   Patient will be able to return demonstrate and/or verbalize understanding of the post-op home exercise program related to regaining shoulder range of motion.   Time 1   Period Days   Status New   Patient will be able to verbalize understanding of the importance of attending the postoperative After Breast Cancer Class for further lymphedema risk reduction education and therapeutic exercise.   Time 1   Period Days   Status New         Necola Bluestein,MARTI COOPER, PT 11/11/2014, 12:13 PM

## 2014-11-11 NOTE — Progress Notes (Signed)
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  Biopsy results also revealed pathology indicating the tumor is ER positive, PR positive, and HER2/neu negative. Ki67 is 15%.  She presents today with her daughter to the [facility]) for treatment consideration and recommendations from the breast surgeon, radiation oncologist, and medical oncologist.     I briefly met with Erica Whitney and her daughter during her [facility] visit today. We discussed the purpose of the Survivorship Clinic, which will include monitoring for recurrence, coordinating completion of age and gender-appropriate cancer screenings, promotion of overall wellness, as well as managing potential late/long-term side effects of anti-cancer treatments.    As of today, the treatment plan for Erica Whitney will likely include surgery. Endocrine therapy with an aromatase inhibitor will be considered for her as well.  She will see the Genetics Counselor given her family history of breast cancer. The intent of treatment for Erica Whitney is cure, therefore she will be eligible for the Survivorship Clinic upon her completion of treatment.  Her survivorship care plan (SCP) document will be drafted and updated throughout the course of her treatment trajectory. She will receive the SCP in an office visit with myself in the Survivorship Clinic once she has completed treatment.   Erica Whitney was encouraged to ask questions and all questions were answered to her satisfaction.  She was given my business card and encouraged to contact me with any concerns regarding survivorship.  I look forward to  participating in her care.

## 2014-11-11 NOTE — Progress Notes (Signed)
I met with Erica Whitney and her daughter today. She will be treated with lumpectomy and aromatase inhibitor.

## 2014-11-11 NOTE — Progress Notes (Signed)
[facility] Psychosocial Distress Screening Clinical Social Work  Patient completed distress screening protocol and scored a 10 on the Psychosocial Distress Thermometer which indicates severe distress. Clinical Social Worker met with patient and patients daughter in [facility] to assess for distress and other psychosocial needs. Patient stated she was doing "better" and her level of distress had decreased after meeting with the treatment team and getting more information on her treatment plan.  CSW, patient, and patients daughter discussed common emotional responses to being diagnosed with cancer and the importance of emotional support.  Patient identified her faith and daughter as support. CSW informed patient of the support services and support team at [facility] and encouraged her to call with any questions or concerns.   ONCBCN DISTRESS SCREENING 11/11/2014  Screening Type Initial Screening  Distress experienced in past week (1-10) 10  Family Problem type Other (comment)  Emotional problem type Depression;Nervousness/Anxiety;Boredom  Spiritual/Religous concerns type Relating to God  Information Concerns Type Lack of info about diagnosis  Physical Problem type Pain;Sleep/insomnia;Constipation/diarrhea;Sexual problems;Skin dry/itchy;Swollen arms/legs  Physician notified of physical symptoms Yes  Referral to clinical psychology No  Referral to clinical social work Yes  Referral to support programs Yes   Johnnye Lana, MSW, LCSW, OSW-C Clinical Social Worker [place] [phone]

## 2014-11-11 NOTE — Progress Notes (Signed)
Drain NOTE  Patient Care Team: Philis Fendt, MD as PCP - General (Internal Medicine) Autumn Messing III, MD as Consulting Physician (General Surgery) Truitt Merle, MD as Consulting Physician (Hematology) Thea Silversmith, MD as Consulting Physician (Radiation Oncology)    Breast cancer of upper-outer quadrant of right female breast   10/26/2014 Imaging screening mammogram showed a 91m mass in right breast at 10 o'clock position.    11/05/2014 Pathology Results Biopsy showed grade 2 IDA, ER 90%+, PR 10%+, HER2-, with grade 1 DCIS    11/06/2014 Initial Diagnosis Breast cancer of upper-outer quadrant of right female breast    CHIEF COMPLAINTS/PURPOSE OF CONSULTATION:  Breast cancer   HISTORY OF PRESENTING ILLNESS:  Erica Whitney 73y.o. female is here because of recent diagnosed breast cancer.  She had routine mammogram screening on 10/26/2014, which showed 5 mm mass in the right breast 10:00 position. She denies any pain or any new symptoms. 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 Ki-67 15%. She had breast MRI on 11/24 2015, which showed a 7 mm enhancing lesion in the right breast biopsy site.  She lives alone, able to take care of her daily needs independently, but she is not very physically active overall. She has chronic bilateral knee pain arm pain from arthritis, and she and appetite is moderate. She has insomnia and hot flashes. She denies any other new symptoms. She does not drive, her daughter lives close to her and presents to clinic with her today.   MEDICAL HISTORY:  Past Medical History  Diagnosis Date  . Hypertension   . Diabetes mellitus   . Hypercholesteremia   . Peptic ulcer   . Arthritis   . Insomnia   . Depression   . Breast cancer of upper-outer quadrant of right female breast 11/06/2014  Endometrial cancer over 10 years ago,  s/p hyst rectomy   SURGICAL HISTORY: Past Surgical History  Procedure Laterality Date  . Abdominal hysterectomy    Lumber spine surgery 15 years   SOCIAL HISTORY: History   Social History  . Marital Status: Single    Spouse Name: N/A    Number of Children: 4, 2 girls, 2 boys, age 72-52  . Years of Education: N/A  She lives alone, her daughter lives close to her   Occupational History   Cleaning     Social History Main Topics  . Smoking status: Never Smoker   . Smokeless tobacco: Current User    Types: Snuff  . Alcohol Use: No  . Drug Use: No  . Sexual Activity: Not on file     FAMILY HISTORY: Mother had lung cancer at age of 91Sister had breast ca at age of 555Brother had lung cancer at 571No other family history of malignancy.  ALLERGIES:  has No Known Allergies.  MEDICATIONS:  Current Outpatient Prescriptions  Medication Sig Dispense Refill  . aspirin EC 81 MG tablet Take 81 mg by mouth daily.      .Marland Kitchenatorvastatin (LIPITOR) 40 MG tablet Take 40 mg by mouth daily.      . Calcium Carbonate-Vitamin D (CALCIUM 600+D) 600-400 MG-UNIT per tablet Take 1 tablet by mouth daily.      . cycloSPORINE (RESTASIS) 0.05 % ophthalmic emulsion Place 1 drop into both eyes 2 (two) times daily.      .Marland Kitchenglimepiride (AMARYL) 4 MG tablet Take  4 mg by mouth 2 (two) times daily with a meal.      . lisinopril-hydrochlorothiazide (PRINZIDE,ZESTORETIC) 10-12.5 MG per tablet Take 1 tablet by mouth daily.      . metFORMIN (GLUCOPHAGE) 500 MG tablet Take 1 tablet (500 mg total) by mouth 2 (two) times daily with a meal. 20 tablet 0  . NIFEdipine (PROCARDIA XL/ADALAT-CC) 60 MG 24 hr tablet Take 60 mg by mouth daily.      Marland Kitchen omeprazole (PRILOSEC) 20 MG capsule Take 20 mg by mouth 2 (two) times daily.      . sertraline (ZOLOFT) 100 MG tablet Take 100 mg by mouth every morning.      . Turmeric 450 MG CAPS Take 1 capsule by mouth daily.      . vitamin C (ASCORBIC ACID) 500 MG tablet Take 500 mg by  mouth daily.      Marland Kitchen zolpidem (AMBIEN) 10 MG tablet Take 5 mg by mouth at bedtime as needed. She takes four nights a week for sleep.      No current facility-administered medications for this visit.    REVIEW OF SYSTEMS:   Constitutional: Denies fevers, chills or abnormal night sweats Eyes: Denies blurriness of vision, double vision or watery eyes Ears, nose, mouth, throat, and face: Denies mucositis or sore throat Respiratory: Denies cough, dyspnea or wheezes Cardiovascular: Denies palpitation, chest discomfort or lower extremity swelling Gastrointestinal:  Denies nausea, heartburn or change in bowel habits Skin: Denies abnormal skin rashes Lymphatics: Denies new lymphadenopathy or easy bruising Neurological:Denies numbness, tingling or new weaknesses Behavioral/Psych: Mood is stable, no new changes  All other systems were reviewed with the patient and are negative. (+) joints pain in knees and arms   PHYSICAL EXAMINATION: ECOG PERFORMANCE STATUS: 0 - Asymptomatic  Filed Vitals:   11/11/14 0906  BP: 157/79  Pulse: 70  Temp: 98.2 F (36.8 C)  Resp: 18   Filed Weights   11/11/14 0906  Weight: 182 lb (82.555 kg)    GENERAL:alert, no distress and comfortable SKIN: skin color, texture, turgor are normal, no rashes or significant lesions EYES: normal, conjunctiva are pink and non-injected, sclera clear OROPHARYNX:no exudate, no erythema and lips, buccal mucosa, and tongue normal  NECK: supple, thyroid normal size, non-tender, without nodularity LYMPH:  no palpable lymphadenopathy in the cervical, axillary or inguinal LUNGS: clear to auscultation and percussion with normal breathing effort HEART: regular rate & rhythm and no murmurs and no lower extremity edema ABDOMEN:abdomen soft, non-tender and normal bowel sounds Musculoskeletal:no cyanosis of digits and no clubbing  PSYCH: alert & oriented x 3 with fluent speech NEURO: no focal motor/sensory deficits  LABORATORY DATA:   I have reviewed the data as listed Lab Results  Component Value Date   WBC 7.3 11/11/2014   HGB 11.3* 11/11/2014   HCT 35.7 11/11/2014   MCV 94.7 11/11/2014   PLT 200 11/11/2014    Recent Labs  11/11/14 0838  NA 141  K 4.5  CO2 24  GLUCOSE 136  BUN 32.2*  CREATININE 1.0  CALCIUM 10.5*  PROT 7.8  ALBUMIN 3.9  AST 12  ALT 12  ALKPHOS 67  BILITOT 0.43     PATHOLOGY REPORT Diagnosis 11/04/2014 Breast, right, needle core biopsy, mass - INVASIVE DUCTAL CARCINOMA, 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 - CALCIFICATIONS PRESENT. Microscopic Comment Although the grade of tumor is best assessed at resection, with these biopsies, the invasive tumor is grade II and in situ carcinoma grade I.  RADIOGRAPHIC STUDIES:  I have personally reviewed the radiological images as listed and agreed with the findings in the report.  Mr Breast Bilateral W Wo Contrast 11/10/2014    FINDINGS:  Breast composition: b.  Scattered fibroglandular tissue.  Background parenchymal enhancement: Mild to moderate  Right breast: There is considerable post biopsy change related to the recent right breast ultrasound-guided core biopsy. The clip is located 2.3 cm posterior and slightly lateral with respect to the small irregular enhancing mass located within the upper outer quadrant of the right breast (middle 1/3). This measures 7 x 7 x 5 mm in size and is associated with a mixture of plateau and washout enhancement kinetics. There are no additional areas of worrisome enhancement within the right breast.  Left breast: No mass or abnormal enhancement.  Lymph nodes: No abnormal appearing lymph nodes.  Ancillary findings:  None.    IMPRESSION:  The clip is located 2.3 cm posterior and slightly lateral with respect of the small (7 mm) irregular enhancing mass located within the upper outer quadrant of the right breast (middle 1/3). Post biopsy change within the right breast as discussed above. No additional  findings and no evidence for adenopathy.    Mm Digital Diagnostic Unilat R 10/26/2014 IMPRESSION:  5 mm mass at 10 o'clock position right breast is suspicious for malignancy.     US Breast Ltd Uni Right Inc Axilla 10/26/2014   IMPRESSION:  5 mm mass at 10 o'clock position right breast is suspicious for malignancy.   RECOMMENDATION: Ultrasound-guided biopsy is recommended and has been scheduled for November 04, 2014 at 1 o'clock p.m.    ASSESSMENT & PLAN:  Erica Whitney is a 73 year old female with multiple comorbidities, including hypertension, diabetes, arthritis with chronic pain, depression and insomnia, who was recently found to have 5-7 mm right breast mass at 10 o'clock position. Biopsy reviewed invasive ductal carcinoma, grade 2, ER 90% positive, PR 10% positive, HER-2 negative, [id number]%, with DCIS, grade 1. She has T1b N0 M0 stage IA disease.    In our breast multidisciplinary clinic today, she has met our surgeon Dr. Marlou Starks and discussed surgical options and would likely have lumpectomy. She also met radiation oncologist Dr. Pablo Ledger who recommended adjuvant radiation after lumpectomy.   Giving her small primary tumor and very early stage ER strongly positive breast cancer, the benefit of adjuvant chemotherapy is uncertain. We discussed the role of Oncotype test to predict her cancer recurrence risks and the benefit of adjuvant chemotherapy. However she isn't not a very fit patient, and she is also very reluctant to get chemotherapy. So I would not recommend Oncotype test and adjuvant chemotherapy.  Given the strong ER/PR positive tumor, she would mostly benefit from adjuvant hormonal therapy to reduce her risks of cancer recurrence. She is postmenopausal, I would recommend an aromatase inhibitor such as Arimidex. I briefly discussed the side effects with her and her daughter, she agrees with the plan. Next  I plan to see her back 2-3 weeks after surgery, review her surgical findings, and  discussed her adjuvant hormonal therapy again at that time. I'll likely start Arimidex after she completes her adjuvant radiation.  She has a sister with breast cancer at age of 68, she will be referred to see genetic counselor for further genetic testing.   All questions were answered. The patient knows to call the clinic with any problems, questions or concerns. I spent 30 minutes counseling the patient face to face. The total time spent in the appointment was 71  minutes and more than 50% was on counseling.     Truitt Merle, MD 11/11/2014 9:30 AM     lab

## 2014-11-11 NOTE — Progress Notes (Signed)
Checked in new patient with no issues prior to seeing the dr. She has appt card and I gave [facility] packet. She has not been traveling. I advised of [employer].

## 2014-11-11 NOTE — Patient Instructions (Signed)

## 2014-11-16 ENCOUNTER — Telehealth: Payer: Self-pay | Admitting: *Deleted

## 2014-11-16 NOTE — Telephone Encounter (Signed)
Spoke with patient from [facility] 11/11/14.  She is doing well.  No questions or concerns at this time. Reminded her of the appointment for genetics on 11/25/14 at 11am.  Encouraged her to call with any needs or concerns.

## 2014-11-18 ENCOUNTER — Other Ambulatory Visit (INDEPENDENT_AMBULATORY_CARE_PROVIDER_SITE_OTHER): Payer: Self-pay | Admitting: General Surgery

## 2014-11-18 DIAGNOSIS — C50911 Malignant neoplasm of unspecified site of right female breast: Secondary | ICD-10-CM

## 2014-11-25 ENCOUNTER — Ambulatory Visit (HOSPITAL_BASED_OUTPATIENT_CLINIC_OR_DEPARTMENT_OTHER): Payer: PRIVATE HEALTH INSURANCE | Admitting: Genetic Counselor

## 2014-11-25 ENCOUNTER — Encounter: Payer: Self-pay | Admitting: Genetic Counselor

## 2014-11-25 ENCOUNTER — Other Ambulatory Visit: Payer: PRIVATE HEALTH INSURANCE

## 2014-11-25 ENCOUNTER — Telehealth: Payer: Self-pay | Admitting: *Deleted

## 2014-11-25 ENCOUNTER — Telehealth: Payer: Self-pay | Admitting: Hematology

## 2014-11-25 DIAGNOSIS — Z8542 Personal history of malignant neoplasm of other parts of uterus: Secondary | ICD-10-CM

## 2014-11-25 DIAGNOSIS — C50911 Malignant neoplasm of unspecified site of right female breast: Secondary | ICD-10-CM

## 2014-11-25 DIAGNOSIS — Z800 Family history of malignant neoplasm of digestive organs: Secondary | ICD-10-CM

## 2014-11-25 DIAGNOSIS — C55 Malignant neoplasm of uterus, part unspecified: Secondary | ICD-10-CM | POA: Insufficient documentation

## 2014-11-25 DIAGNOSIS — Z803 Family history of malignant neoplasm of breast: Secondary | ICD-10-CM

## 2014-11-25 NOTE — Telephone Encounter (Signed)
Left message for a return phone call to schedule follow up appointment with Dr. Burr Medico.  I will send a POF to get this scheduled.

## 2014-11-25 NOTE — Telephone Encounter (Signed)
S/w pt confirming MD visit per 12/09 POF, mailed sch to pt .... Erica Whitney

## 2014-11-25 NOTE — Progress Notes (Signed)
REFERRING PROVIDER: Philis Fendt, MD [place], [place] [address]  PRIMARY PROVIDER:  Philis Fendt, MD  PRIMARY REASON FOR VISIT:  1. Breast cancer, right   2. History of uterine cancer   3. Family history of breast cancer   4. Family history of stomach cancer      HISTORY OF PRESENT ILLNESS:   Ms. Gongaware, a 73 y.o. female, was seen for a  cancer genetics consultation at the request of Dr. Jeanie Cooks due to a personal and family history of cancer.  Ms. Brege presents to clinic today to discuss the possibility of a hereditary predisposition to cancer, genetic testing, and to further clarify her future cancer risks, as well as potential cancer risks for family members.   CANCER HISTORY:    Breast cancer of upper-outer quadrant of right female breast   10/26/2014 Imaging screening mammogram showed a 10m mass in right breast at 10 o'clock position.    11/05/2014 Pathology Results Biopsy showed grade 2 IDA, ER 90%+, PR 10%+, HER2-, with grade 1 DCIS    11/06/2014 Initial Diagnosis Breast cancer of upper-outer quadrant of right female breast     HISTORY OF PRESENT ILLNESS: In 2015, at the age of 89 Ms. MSitzerwas diagnosed with invasive ductal carcinoma of the breast. The tumor is ER+/PR+/Her2-.  She is scheduled for radioactive seed implants at the end of the month.  Ms. MLeipoldwas found to have uterine cancer in her 583s She had a hysterectomy because of that.   HORMONAL RISK FACTORS:  Menarche was at age 73  OCP use for approximately 2 years.  Ovaries intact: no.  Hysterectomy: yes.  Menopausal status: postmenopausal.  HRT use: 0 years. Colonoscopy: yes; she does not report having polyps but states that she needs a colonoscopy every 5 years.. Mammogram within the last year: yes. Number of breast biopsies: 0. Up to date with pelvic exams:  yes. Any excessive radiation exposure in the past:  no  Past Medical History  Diagnosis Date  .  Hypertension   . Diabetes mellitus   . Hypercholesteremia   . Peptic ulcer   . Arthritis   . Insomnia   . Depression   . Breast cancer of upper-outer quadrant of right female breast 11/06/2014  . Uterine cancer     dx in her 577s   Past Surgical History  Procedure Laterality Date  . Abdominal hysterectomy    . Back surgery      History   Social History  . Marital Status: Single    Spouse Name: N/A    Number of Children: 4  . Years of Education: N/A   Social History Main Topics  . Smoking status: Former Smoker -- 0.25 packs/day    Quit date: 11/12/2011  . Smokeless tobacco: Current User    Types: Snuff  . Alcohol Use: No  . Drug Use: No  . Sexual Activity: None   Other Topics Concern  . None   Social History Narrative     FAMILY HISTORY:  We obtained a detailed, 4-generation family history.  Significant diagnoses are listed below: Family History  Problem Relation Age of Onset  . Cancer Mother   . Stomach cancer Mother 971 . Heart attack Father   . Cancer Brother   . Lung cancer Brother 516 . Breast cancer Sister 550  Ms. MHorrellhad three brothers and five sisters.  One sister died at 237months of age, two live in  the area and another lives in [place].  One sister had breast cancer found at age 71 and she died within the year.  Her parents come from large families, having 12-14 brothers and sisters each.  Although she was not well acquainted with her family history of cancer, she is unaware of other family members with cancer.  Patient's maternal ancestors are of African American descent, and paternal ancestors are of African American descent. There is no reported Ashkenazi Jewish ancestry. There is no known consanguinity.  GENETIC COUNSELING ASSESSMENT: ELNORE COSENS is a 73 y.o. female with a personal history of uterine and breast cancer and family history of breast cancer, but does not have a typical pattern of cancer in the family that would be  suggestive of a predisposition to cancer. We, therefore, discussed and recommended the following at today's visit.   DISCUSSION: We reviewed the characteristics, features and inheritance patterns of hereditary cancer syndromes. Despite that Ms. Delong has had cancer twice, we discussed that the family history is not highly consistent with a familial hereditary cancer syndrome, and we feel she is at low risk to harbor a gene mutation associated with such a condition. This assessment was made because both cancers in Ms. Madura occurred after she was 10, her sister was diagnosed with breast cancer at 67 and her mother had possible stomach cancer in her 68s. She comes from a large family, where we would expect more individuals with cancer if there was a typical presentation of hereditary cancer.  We discussed that we could offer testing through [facility], but that the likelihood of her testing positive was low.  Thus, we did not recommend any genetic testing, at this time, and recommended Ms. Pounds continue to follow the cancer screening guidelines given by her primary healthcare provider.  PLAN: As mentioned above, Ms. Weatherman's family history is not consistent with a hereditary cancer syndrome, despite her being diagnosed with cancer twice.  She currently does not meet the criteria for genetic testing per Medicare guidelines.  She will talk with her family to determine whether she would still want to pursue genetic testing through [facility], and will call if she changes her mind.    Lastly, we encouraged Ms. Rolston to remain in contact with cancer genetics annually so that we can continuously update the family history and inform her of any changes in cancer genetics and testing that may be of benefit for this family.   Ms.  Rothenberger questions were answered to her satisfaction today. Our contact information was provided should additional questions or concerns arise. Thank you for the referral and  allowing [place] to share in the care of your patient.   Karen P. Florene Glen, [place], [facility] Certified Genetic Counselor [email or web address] phone: [phone]  The patient was seen for a total of 60 minutes in face-to-face genetic counseling.  This patient was discussed with Drs. Magrinat, Lindi Adie and/or Burr Medico who agrees with the above.    _______________________________________________________________________ For Office Staff:  Number of people involved in session: 1 Was an Intern/ student involved with case: no

## 2014-12-01 ENCOUNTER — Other Ambulatory Visit: Payer: Self-pay | Admitting: *Deleted

## 2014-12-09 ENCOUNTER — Encounter (HOSPITAL_BASED_OUTPATIENT_CLINIC_OR_DEPARTMENT_OTHER): Payer: Self-pay | Admitting: *Deleted

## 2014-12-09 NOTE — Progress Notes (Signed)
Talked with pt-had her write down instructions and gave her number for daughter to call-will give list meds to take when she comes for labs after seeds

## 2014-12-16 ENCOUNTER — Ambulatory Visit
Admission: RE | Admit: 2014-12-16 | Discharge: 2014-12-16 | Disposition: A | Discharge status: Home / Self Care | Payer: PRIVATE HEALTH INSURANCE | Source: Ambulatory Visit | Attending: General Surgery | Admitting: General Surgery

## 2014-12-16 DIAGNOSIS — C50911 Malignant neoplasm of unspecified site of right female breast: Secondary | ICD-10-CM

## 2014-12-17 ENCOUNTER — Ambulatory Visit (HOSPITAL_BASED_OUTPATIENT_CLINIC_OR_DEPARTMENT_OTHER): Payer: PRIVATE HEALTH INSURANCE | Admitting: Anesthesiology

## 2014-12-17 ENCOUNTER — Ambulatory Visit (HOSPITAL_BASED_OUTPATIENT_CLINIC_OR_DEPARTMENT_OTHER)
Admission: RE | Admit: 2014-12-17 | Discharge: 2014-12-17 | Disposition: A | Discharge status: Home / Self Care | Payer: PRIVATE HEALTH INSURANCE | Source: Ambulatory Visit | Attending: General Surgery | Admitting: General Surgery

## 2014-12-17 ENCOUNTER — Ambulatory Visit
Admission: RE | Admit: 2014-12-17 | Discharge: 2014-12-17 | Disposition: A | Discharge status: Home / Self Care | Payer: PRIVATE HEALTH INSURANCE | Source: Ambulatory Visit | Attending: General Surgery | Admitting: General Surgery

## 2014-12-17 ENCOUNTER — Encounter (HOSPITAL_BASED_OUTPATIENT_CLINIC_OR_DEPARTMENT_OTHER): Payer: Self-pay | Admitting: *Deleted

## 2014-12-17 ENCOUNTER — Encounter (HOSPITAL_COMMUNITY)
Admission: RE | Admit: 2014-12-17 | Discharge: 2014-12-17 | Disposition: A | Discharge status: Home / Self Care | Payer: PRIVATE HEALTH INSURANCE | Source: Ambulatory Visit | Attending: General Surgery | Admitting: General Surgery

## 2014-12-17 ENCOUNTER — Encounter (HOSPITAL_BASED_OUTPATIENT_CLINIC_OR_DEPARTMENT_OTHER)
Admission: RE | Disposition: A | Discharge status: Home / Self Care | Payer: Self-pay | Source: Ambulatory Visit | Attending: General Surgery

## 2014-12-17 DIAGNOSIS — Z87891 Personal history of nicotine dependence: Secondary | ICD-10-CM | POA: Insufficient documentation

## 2014-12-17 DIAGNOSIS — K219 Gastro-esophageal reflux disease without esophagitis: Secondary | ICD-10-CM | POA: Diagnosis not present

## 2014-12-17 DIAGNOSIS — Z170 Estrogen receptor positive status [ER+]: Secondary | ICD-10-CM | POA: Diagnosis not present

## 2014-12-17 DIAGNOSIS — C50411 Malignant neoplasm of upper-outer quadrant of right female breast: Secondary | ICD-10-CM | POA: Diagnosis not present

## 2014-12-17 DIAGNOSIS — E780 Pure hypercholesterolemia: Secondary | ICD-10-CM | POA: Insufficient documentation

## 2014-12-17 DIAGNOSIS — I491 Atrial premature depolarization: Secondary | ICD-10-CM | POA: Diagnosis not present

## 2014-12-17 DIAGNOSIS — Z833 Family history of diabetes mellitus: Secondary | ICD-10-CM | POA: Diagnosis not present

## 2014-12-17 DIAGNOSIS — Z841 Family history of disorders of kidney and ureter: Secondary | ICD-10-CM | POA: Diagnosis not present

## 2014-12-17 DIAGNOSIS — F329 Major depressive disorder, single episode, unspecified: Secondary | ICD-10-CM | POA: Insufficient documentation

## 2014-12-17 DIAGNOSIS — I10 Essential (primary) hypertension: Secondary | ICD-10-CM | POA: Diagnosis not present

## 2014-12-17 DIAGNOSIS — Z8249 Family history of ischemic heart disease and other diseases of the circulatory system: Secondary | ICD-10-CM | POA: Diagnosis not present

## 2014-12-17 DIAGNOSIS — Z8711 Personal history of peptic ulcer disease: Secondary | ICD-10-CM | POA: Insufficient documentation

## 2014-12-17 DIAGNOSIS — Z794 Long term (current) use of insulin: Secondary | ICD-10-CM | POA: Diagnosis not present

## 2014-12-17 DIAGNOSIS — M1990 Unspecified osteoarthritis, unspecified site: Secondary | ICD-10-CM | POA: Diagnosis not present

## 2014-12-17 DIAGNOSIS — Z8261 Family history of arthritis: Secondary | ICD-10-CM | POA: Insufficient documentation

## 2014-12-17 DIAGNOSIS — E119 Type 2 diabetes mellitus without complications: Secondary | ICD-10-CM | POA: Insufficient documentation

## 2014-12-17 DIAGNOSIS — C50911 Malignant neoplasm of unspecified site of right female breast: Secondary | ICD-10-CM | POA: Diagnosis present

## 2014-12-17 HISTORY — PX: RADIOACTIVE SEED GUIDED PARTIAL MASTECTOMY WITH AXILLARY SENTINEL LYMPH NODE BIOPSY: SHX6520

## 2014-12-17 HISTORY — DX: Presence of spectacles and contact lenses: Z97.3

## 2014-12-17 LAB — POCT I-STAT, CHEM 8
BUN: 26 mg/dL — ABNORMAL HIGH (ref 6–23)
CREATININE: 0.9 mg/dL (ref 0.50–1.10)
Calcium, Ion: 1.24 mmol/L (ref 1.13–1.30)
Chloride: 108 mEq/L (ref 96–112)
GLUCOSE: 157 mg/dL — AB (ref 70–99)
HCT: 37 % (ref 36.0–46.0)
Hemoglobin: 12.6 g/dL (ref 12.0–15.0)
POTASSIUM: 4 mmol/L (ref 3.5–5.1)
SODIUM: 143 mmol/L (ref 135–145)
TCO2: 19 mmol/L (ref 0–100)

## 2014-12-17 LAB — GLUCOSE, CAPILLARY: Glucose-Capillary: 184 mg/dL — ABNORMAL HIGH (ref 70–99)

## 2014-12-17 SURGERY — RADIOACTIVE SEED GUIDED PARTIAL MASTECTOMY WITH AXILLARY SENTINEL LYMPH NODE BIOPSY
Anesthesia: Regional | Site: Breast | Laterality: Right

## 2014-12-17 MED ORDER — LACTATED RINGERS IV SOLN
INTRAVENOUS | Status: DC
  Administered 2014-12-17 (×3): via INTRAVENOUS

## 2014-12-17 MED ORDER — SODIUM CHLORIDE 0.9 % IJ SOLN
INTRAMUSCULAR | Status: AC
  Filled 2014-12-17: qty 10

## 2014-12-17 MED ORDER — BUPIVACAINE HCL (PF) 0.25 % IJ SOLN
INTRAMUSCULAR | Status: DC | PRN
  Administered 2014-12-17: 30 mL

## 2014-12-17 MED ORDER — DEXAMETHASONE SODIUM PHOSPHATE 4 MG/ML IJ SOLN
INTRAMUSCULAR | Status: DC | PRN
  Administered 2014-12-17: 10 mg via INTRAVENOUS

## 2014-12-17 MED ORDER — CEFAZOLIN SODIUM-DEXTROSE 2-3 GM-% IV SOLR
INTRAVENOUS | Status: AC
  Filled 2014-12-17: qty 50

## 2014-12-17 MED ORDER — CEFAZOLIN SODIUM-DEXTROSE 2-3 GM-% IV SOLR: 2.0000 g | INTRAVENOUS | Status: DC

## 2014-12-17 MED ORDER — FENTANYL CITRATE 0.05 MG/ML IJ SOLN
INTRAMUSCULAR | Status: AC
  Filled 2014-12-17: qty 2

## 2014-12-17 MED ORDER — BUPIVACAINE HCL (PF) 0.25 % IJ SOLN
INTRAMUSCULAR | Status: AC
  Filled 2014-12-17: qty 30

## 2014-12-17 MED ORDER — MIDAZOLAM HCL 2 MG/2ML IJ SOLN
INTRAMUSCULAR | Status: AC
  Filled 2014-12-17: qty 2

## 2014-12-17 MED ORDER — CHLORHEXIDINE GLUCONATE 4 % EX LIQD: 1.0000 "application " | Freq: Once | CUTANEOUS | Status: DC

## 2014-12-17 MED ORDER — METHYLENE BLUE 1 % INJ SOLN
INTRAMUSCULAR | Status: AC
  Filled 2014-12-17: qty 10

## 2014-12-17 MED ORDER — MIDAZOLAM HCL 2 MG/2ML IJ SOLN
1.0000 mg | INTRAMUSCULAR | Status: DC | PRN
  Administered 2014-12-17: 1 mg via INTRAVENOUS

## 2014-12-17 MED ORDER — BUPIVACAINE-EPINEPHRINE (PF) 0.5% -1:200000 IJ SOLN
INTRAMUSCULAR | Status: DC | PRN
  Administered 2014-12-17: 30 mL via PERINEURAL

## 2014-12-17 MED ORDER — EPHEDRINE SULFATE 50 MG/ML IJ SOLN
INTRAMUSCULAR | Status: DC | PRN
  Administered 2014-12-17: 10 mg via INTRAVENOUS

## 2014-12-17 MED ORDER — LIDOCAINE HCL (CARDIAC) 20 MG/ML IV SOLN
INTRAVENOUS | Status: DC | PRN
  Administered 2014-12-17: 50 mg via INTRAVENOUS

## 2014-12-17 MED ORDER — GLYCOPYRROLATE 0.2 MG/ML IJ SOLN
INTRAMUSCULAR | Status: DC | PRN
  Administered 2014-12-17: 0.2 mg via INTRAVENOUS

## 2014-12-17 MED ORDER — PROPOFOL 10 MG/ML IV BOLUS
INTRAVENOUS | Status: DC | PRN
  Administered 2014-12-17: 50 mg via INTRAVENOUS
  Administered 2014-12-17: 150 mg via INTRAVENOUS

## 2014-12-17 MED ORDER — FENTANYL CITRATE 0.05 MG/ML IJ SOLN: INTRAMUSCULAR | Status: DC | PRN

## 2014-12-17 MED ORDER — BUPIVACAINE-EPINEPHRINE (PF) 0.25% -1:200000 IJ SOLN
INTRAMUSCULAR | Status: AC
Start: 2014-12-17 — End: 2014-12-17
  Filled 2014-12-17: qty 30

## 2014-12-17 MED ORDER — CEFAZOLIN SODIUM-DEXTROSE 2-3 GM-% IV SOLR
INTRAVENOUS | Status: DC | PRN
  Administered 2014-12-17: 2 g via INTRAVENOUS

## 2014-12-17 MED ORDER — ONDANSETRON HCL 4 MG/2ML IJ SOLN: 4.0000 mg | Freq: Four times a day (QID) | INTRAMUSCULAR | Status: DC | PRN

## 2014-12-17 MED ORDER — FENTANYL CITRATE 0.05 MG/ML IJ SOLN: 25.0000 ug | INTRAMUSCULAR | Status: DC | PRN

## 2014-12-17 MED ORDER — OXYCODONE-ACETAMINOPHEN 5-325 MG PO TABS: 1.0000 | ORAL_TABLET | ORAL | Status: DC | PRN

## 2014-12-17 MED ORDER — TECHNETIUM TC 99M SULFUR COLLOID FILTERED: 1.0000 | Freq: Once | INTRAVENOUS | Status: AC | PRN

## 2014-12-17 MED ORDER — FENTANYL CITRATE 0.05 MG/ML IJ SOLN
INTRAMUSCULAR | Status: AC
  Filled 2014-12-17: qty 6

## 2014-12-17 MED ORDER — MIDAZOLAM HCL 5 MG/5ML IJ SOLN: INTRAMUSCULAR | Status: DC | PRN

## 2014-12-17 MED ORDER — PROPOFOL 10 MG/ML IV BOLUS
INTRAVENOUS | Status: AC
  Filled 2014-12-17: qty 80

## 2014-12-17 MED ORDER — FENTANYL CITRATE 0.05 MG/ML IJ SOLN
50.0000 ug | INTRAMUSCULAR | Status: DC | PRN
  Administered 2014-12-17: 50 ug via INTRAVENOUS

## 2014-12-17 SURGICAL SUPPLY — 41 items
APPLIER CLIP 9.375 MED OPEN (MISCELLANEOUS) ×2
BLADE SURG 15 STRL LF DISP TIS (BLADE) ×1 IMPLANT
BLADE SURG 15 STRL SS (BLADE) ×1
CANISTER SUC SOCK COL 7IN (MISCELLANEOUS) IMPLANT
CANISTER SUCT 1200ML W/VALVE (MISCELLANEOUS) ×2 IMPLANT
CHLORAPREP W/TINT 26ML (MISCELLANEOUS) ×2 IMPLANT
CLIP APPLIE 9.375 MED OPEN (MISCELLANEOUS) ×1 IMPLANT
COVER BACK TABLE 60X90IN (DRAPES) ×2 IMPLANT
COVER MAYO STAND STRL (DRAPES) ×2 IMPLANT
COVER PROBE W GEL 5X96 (DRAPES) ×2 IMPLANT
DECANTER SPIKE VIAL GLASS SM (MISCELLANEOUS) IMPLANT
DEVICE DUBIN W/COMP PLATE 8390 (MISCELLANEOUS) ×2 IMPLANT
DRAPE LAPAROSCOPIC ABDOMINAL (DRAPES) ×2 IMPLANT
DRAPE UTILITY XL STRL (DRAPES) ×2 IMPLANT
ELECT COATED BLADE 2.86 ST (ELECTRODE) ×2 IMPLANT
ELECT REM PT RETURN 9FT ADLT (ELECTROSURGICAL) ×2
ELECTRODE REM PT RTRN 9FT ADLT (ELECTROSURGICAL) ×1 IMPLANT
GLOVE BIO SURGEON STRL SZ 6.5 (GLOVE) ×2 IMPLANT
GLOVE BIO SURGEON STRL SZ7.5 (GLOVE) ×2 IMPLANT
GLOVE BIOGEL PI IND STRL 7.0 (GLOVE) ×2 IMPLANT
GLOVE BIOGEL PI INDICATOR 7.0 (GLOVE) ×2
GOWN STRL REUS W/ TWL LRG LVL3 (GOWN DISPOSABLE) ×2 IMPLANT
GOWN STRL REUS W/TWL LRG LVL3 (GOWN DISPOSABLE) ×2
KIT MARKER MARGIN INK (KITS) ×2 IMPLANT
LIQUID BAND (GAUZE/BANDAGES/DRESSINGS) ×2 IMPLANT
NDL SAFETY ECLIPSE 18X1.5 (NEEDLE) IMPLANT
NEEDLE HYPO 18GX1.5 SHARP (NEEDLE)
NEEDLE HYPO 25X1 1.5 SAFETY (NEEDLE) ×2 IMPLANT
NS IRRIG 1000ML POUR BTL (IV SOLUTION) ×2 IMPLANT
PACK BASIN DAY SURGERY FS (CUSTOM PROCEDURE TRAY) ×2 IMPLANT
PENCIL BUTTON HOLSTER BLD 10FT (ELECTRODE) ×2 IMPLANT
SLEEVE SCD COMPRESS KNEE MED (MISCELLANEOUS) ×2 IMPLANT
SPONGE LAP 18X18 X RAY DECT (DISPOSABLE) ×2 IMPLANT
SUT MON AB 4-0 PC3 18 (SUTURE) ×4 IMPLANT
SUT SILK 2 0 SH (SUTURE) ×2 IMPLANT
SUT VICRYL 3-0 CR8 SH (SUTURE) ×2 IMPLANT
SYR CONTROL 10ML LL (SYRINGE) ×2 IMPLANT
TOWEL OR 17X24 6PK STRL BLUE (TOWEL DISPOSABLE) ×2 IMPLANT
TOWEL OR NON WOVEN STRL DISP B (DISPOSABLE) IMPLANT
TUBE CONNECTING 20X1/4 (TUBING) ×2 IMPLANT
YANKAUER SUCT BULB TIP NO VENT (SUCTIONS) ×2 IMPLANT

## 2014-12-17 NOTE — Transfer of Care (Signed)
Immediate Anesthesia Transfer of Care Note  Patient: Erica Whitney  Procedure(s) Performed: Procedure(s): RIGHT BREAST RADIOACTIVE SEED LOCALIZED LUMPECTOMY AND SENTINEL NODE MAPPING (Right)  Patient Location: PACU  Anesthesia Type:GA combined with regional for post-op pain  Level of Consciousness: awake, alert  and patient cooperative  Airway & Oxygen Therapy: Patient Spontanous Breathing and Patient connected to face mask oxygen  Post-op Assessment: Report given to PACU RN and Post -op Vital signs reviewed and stable  Post vital signs: Reviewed and stable  Complications: No apparent anesthesia complications

## 2014-12-17 NOTE — Anesthesia Postprocedure Evaluation (Signed)
Anesthesia Post Note  Patient: Erica Whitney  Procedure(s) Performed: Procedure(s) (LRB): RIGHT BREAST RADIOACTIVE SEED LOCALIZED LUMPECTOMY AND SENTINEL NODE MAPPING (Right)  Anesthesia type: General  Patient location: PACU  Post pain: Pain level controlled and Adequate analgesia  Post assessment: Post-op Vital signs reviewed, Patient's Cardiovascular Status Stable, Respiratory Function Stable, Patent Airway and Pain level controlled  Last Vitals:  Filed Vitals:   12/17/14 0955  BP: 147/77  Pulse: 78  Temp: 36.6 C  Resp: 18    Post vital signs: Reviewed and stable  Level of consciousness: awake, alert  and oriented  Complications: No apparent anesthesia complications

## 2014-12-17 NOTE — Progress Notes (Signed)
  Assisted Dr. Hodierne with right, ultrasound guided, pectoralis block. Side rails up, monitors on throughout procedure. See vital signs in flow sheet. Tolerated Procedure well. 

## 2014-12-17 NOTE — H&P (Signed)
Erica Whitney 11/11/2014 11:20 AM Location: [facility] Patient #: [id number] DOB: 09-25-1941 Undefined / Language: Erica Whitney / Race: Undefined Female  History of Present Illness Sammuel Hines. Marlou Starks MD; 11/11/2014 11:22 AM) The patient is a 73 year old female who presents with breast cancer. We're asked to see the patient in consultation by Dr. Luberta Robertson to evaluate her for a right breast cancer. The patient is a 73 year old black female who recently went for a routine screening mammogram. At that time she was found to have an abnormality in the right upper outer quadrant of the breast. This measured 5 mm by ultrasound and 7 mm by MRI. 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 She denied any breast pain. She denied any discharge from her nipple. She does have a sister who has had breast cancer. It was also noted that the clip was 2.3 cm posterior to the mass on MRI study   Other Problems Erica Whitney, Erica Whitney; 11/11/2014 11:24 AM) Arthritis Back Pain Bladder Problems Breast Cancer Chest pain Depression Diabetes Mellitus Gastric Ulcer Gastroesophageal Reflux Disease Heart murmur High blood pressure Hypercholesterolemia Lump In Breast Transfusion history Ulcerative Colitis  Past Surgical History Erica Whitney, Erica Whitney; 11/11/2014 11:24 AM) Breast Biopsy Right. Hysterectomy (not due to cancer) - Complete Spinal Surgery - Lower Back Tonsillectomy  Diagnostic Studies History Erica Whitney, CMA; 11/11/2014 11:24 AM) Colonoscopy within last year Mammogram within last year Pap Smear 1-5 years ago  Social History Erica Whitney, Erica Whitney; 11/11/2014 11:24 AM) No alcohol use No drug use Tobacco use Former smoker.  Family History Erica Whitney, Erica Whitney; 11/11/2014 11:24 AM) Alcohol Abuse Family Members In General. Arthritis Brother, Mother, Sister. Breast Cancer  Sister. Cancer Brother. Cerebrovascular Accident Sister. Colon Cancer Family Members In General. Diabetes Mellitus Sister. Heart Disease Father, Sister. Heart disease in female family member before age 64 Hypertension Daughter, Family Members In Young Erica Whitney, Son. Migraine Headache Daughter.  Pregnancy / Birth History Erica Whitney, Erica Whitney; 11/11/2014 11:24 AM) Age at menarche 32 years. Age of menopause 46-55 Gravida 4 Irregular periods Maternal age 71-20 Para 4  Review of Systems Erica Whitney CMA; 11/11/2014 11:24 AM) General Present- Appetite Loss. Not Present- Chills, Fatigue, Fever, Night Sweats, Weight Gain and Weight Loss. Skin Present- Dryness. Not Present- Change in Wart/Mole, Hives, Jaundice, New Lesions, Non-Healing Wounds, Rash and Ulcer. HEENT Present- Hearing Loss, Seasonal Allergies, Visual Disturbances and Wears glasses/contact lenses. Not Present- Earache, Hoarseness, Nose Bleed, Oral Ulcers, Ringing in the Ears, Sinus Pain, Sore Throat and Yellow Eyes. Respiratory Present- Snoring. Not Present- Bloody sputum, Chronic Cough, Difficulty Breathing and Wheezing. Breast Present- Breast Mass and Breast Pain. Not Present- Nipple Discharge and Skin Changes. Cardiovascular Present- Chest Pain, Rapid Heart Rate, Shortness of Breath and Swelling of Extremities. Not Present- Difficulty Breathing Lying Down, Leg Cramps and Palpitations. Gastrointestinal Present- Bloating, Chronic diarrhea, Constipation and Excessive gas. Not Present- Abdominal Pain, Bloody Stool, Change in Bowel Habits, Difficulty Swallowing, Gets full quickly at meals, Hemorrhoids, Indigestion, Nausea, Rectal Pain and Vomiting. Female Genitourinary Not Present- Frequency, Nocturia, Painful Urination, Pelvic Pain and Urgency. Musculoskeletal Not Present- Back Pain, Joint Pain, Joint Stiffness, Muscle Pain, Muscle Weakness and Swelling of Extremities. Neurological Not Present- Decreased Memory, Fainting,  Headaches, Numbness, Seizures, Tingling, Tremor, Trouble walking and Weakness. Psychiatric Present- Change in Sleep Pattern and Depression. Not Present- Anxiety, Bipolar, Fearful and Frequent crying. Endocrine Present- Hot flashes. Not Present- Cold Intolerance,  Excessive Hunger, Hair Changes, Heat Intolerance and New Diabetes. Hematology Present- Easy Bruising. Not Present- Excessive bleeding, Gland problems, HIV and Persistent Infections.   Physical Exam Eddie Dibbles S. Marlou Starks MD; 11/11/2014 11:23 AM) General Mental Status-Alert. General Appearance-Consistent with stated age. Hydration-Well hydrated. Voice-Normal.  Head and Neck Head-normocephalic, atraumatic with no lesions or palpable masses. Trachea-midline. Thyroid Gland Characteristics - normal size and consistency.  Eye Eyeball - Bilateral-Extraocular movements intact. Sclera/Conjunctiva - Bilateral-No scleral icterus.  Chest and Lung Exam Chest and lung exam reveals -quiet, even and easy respiratory effort with no use of accessory muscles and on auscultation, normal breath sounds, no adventitious sounds and normal vocal resonance. Inspection Chest Wall - Normal. Back - normal.  Breast Note: There is no palpable mass in either breast. There is no palpable axillary, supraclavicular, or cervical lymphadenopathy.   Cardiovascular Cardiovascular examination reveals -normal heart sounds, regular rate and rhythm with no murmurs and normal pedal pulses bilaterally.  Abdomen Inspection Inspection of the abdomen reveals - No Hernias. Skin - Scar - no surgical scars. Palpation/Percussion Palpation and Percussion of the abdomen reveal - Soft, Non Tender, No Rebound tenderness, No Rigidity (guarding) and No hepatosplenomegaly. Auscultation Auscultation of the abdomen reveals - Bowel sounds normal.  Neurologic Neurologic evaluation reveals -alert and oriented x 3 with no impairment of recent or remote  memory. Mental Status-Normal.  Musculoskeletal Normal Exam - Left-Upper Extremity Strength Normal and Lower Extremity Strength Normal. Normal Exam - Right-Upper Extremity Strength Normal and Lower Extremity Strength Normal.  Lymphatic Head & Neck  General Head & Neck Lymphatics: Bilateral - Description - Normal. Axillary  General Axillary Region: Bilateral - Description - Normal. Tenderness - Non Tender. Femoral & Inguinal  Generalized Femoral & Inguinal Lymphatics: Bilateral - Description - Normal. Tenderness - Non Tender.    Assessment & Plan Eddie Dibbles S. Marlou Starks MD; 11/11/2014 11:24 AM) PRIMARY CANCER OF UPPER OUTER QUADRANT OF RIGHT FEMALE BREAST (174.4  C50.411) Impression: The patient appears to have a small stage I cancer of the upper outer quadrant of the right breast. I have talked with her in detail today about the different options for surgical treatment and at this point she favors breast conservation. She is also very good candidate for sentinel node mapping. I have discussed with her in detail the risks and benefits of the operation to do this as well as some of the technical aspects and she understands and wishes to proceed. Plan for right breast radioactive seed localized lumpectomy and sentinel node mapping     Signed by Luella Cook, MD (11/11/2014 11:25 AM)

## 2014-12-17 NOTE — Anesthesia Procedure Notes (Addendum)
Anesthesia Regional Block:  Pectoralis block  Pre-Anesthetic Checklist: ,, timeout performed, Correct Patient, Correct Site, Correct Laterality, Correct Procedure, Correct Position, site marked, Risks and benefits discussed,  Surgical consent,  Pre-op evaluation,  At surgeon's request and post-op pain management  Laterality: Right  Prep: chloraprep       Needles:  Injection technique: Single-shot  Needle Type: Echogenic Needle     Needle Length: 9cm 9 cm Needle Gauge: 21 and 21 G    Additional Needles:  Procedures: ultrasound guided (picture in chart) Pectoralis block Narrative:  Start time: 12/17/2014 7:16 AM End time: 12/17/2014 7:26 AM Injection made incrementally with aspirations every 5 mL.  Performed by: Personally  Anesthesiologist: HODIERNE, ADAM  Additional Notes: Pt tolerated the procedure well.   Procedure Name: LMA Insertion Date/Time: 12/17/2014 7:38 AM Performed by: Toula Moos L Pre-anesthesia Checklist: Patient identified, Emergency Drugs available, Suction available, Patient being monitored and Timeout performed Patient Re-evaluated:Patient Re-evaluated prior to inductionOxygen Delivery Method: Circle System Utilized Preoxygenation: Pre-oxygenation with 100% oxygen Intubation Type: IV induction Ventilation: Mask ventilation without difficulty LMA: LMA inserted LMA Size: 4.0 Number of attempts: 1 Airway Equipment and Method: bite block Placement Confirmation: positive ETCO2 Tube secured with: Tape Dental Injury: Teeth and Oropharynx as per pre-operative assessment

## 2014-12-17 NOTE — Anesthesia Preprocedure Evaluation (Signed)
Anesthesia Evaluation  Patient identified by MRN, date of birth, ID band Patient awake    Reviewed: Allergy & Precautions, H&P , NPO status , Patient's Chart, lab work & pertinent test results  Airway Mallampati: II   Neck ROM: full    Dental   Pulmonary former smoker,          Cardiovascular hypertension,     Neuro/Psych Depression    GI/Hepatic PUD,   Endo/Other  diabetes, Type 2  Renal/GU      Musculoskeletal  (+) Arthritis -,   Abdominal   Peds  Hematology   Anesthesia Other Findings   Reproductive/Obstetrics                             Anesthesia Physical Anesthesia Plan  ASA: II  Anesthesia Plan: General and Regional   Post-op Pain Management:    Induction: Intravenous  Airway Management Planned: LMA  Additional Equipment:   Intra-op Plan:   Post-operative Plan:   Informed Consent: I have reviewed the patients History and Physical, chart, labs and discussed the procedure including the risks, benefits and alternatives for the proposed anesthesia with the patient or authorized representative who has indicated his/her understanding and acceptance.     Plan Discussed with: CRNA, Anesthesiologist and Surgeon  Anesthesia Plan Comments:         Anesthesia Quick Evaluation

## 2014-12-17 NOTE — Interval H&P Note (Signed)
History and Physical Interval Note:  12/17/2014 6:57 AM  Erica Whitney Clovis Cao  has presented today for surgery, with the diagnosis of Right Bresat Cancer  The various methods of treatment have been discussed with the patient and family. After consideration of risks, benefits and other options for treatment, the patient has consented to  Procedure(s): RIGHT BREAST RADIOACTIVE SEED LOCALIZED LUMPECTOMY AND SENTINEL NODE MAPPING (Right) as a surgical intervention .  The patient's history has been reviewed, patient examined, no change in status, stable for surgery.  I have reviewed the patient's chart and labs.  Questions were answered to the patient's satisfaction.     TOTH III,PAUL S

## 2014-12-17 NOTE — Discharge Instructions (Signed)
°  Post Anesthesia Home Care Instructions ° °Activity: °Get plenty of rest for the remainder of the day. A responsible adult should stay with you for 24 hours following the procedure.  °For the next 24 hours, DO NOT: °-Drive a car °-Operate machinery °-Drink alcoholic beverages °-Take any medication unless instructed by your physician °-Make any legal decisions or sign important papers. ° °Meals: °Start with liquid foods such as gelatin or soup. Progress to regular foods as tolerated. Avoid greasy, spicy, heavy foods. If nausea and/or vomiting occur, drink only clear liquids until the nausea and/or vomiting subsides. Call your physician if vomiting continues. ° °Special Instructions/Symptoms: °Your throat may feel dry or sore from the anesthesia or the breathing tube placed in your throat during surgery. If this causes discomfort, gargle with warm salt water. The discomfort should disappear within 24 hours. ° °Regional Anesthesia Blocks ° °1. Numbness or the inability to move the "blocked" extremity may last from 3-48 hours after placement. The length of time depends on the medication injected and your individual response to the medication. If the numbness is not going away after 48 hours, call your surgeon. ° °2. The extremity that is blocked will need to be protected until the numbness is gone and the  Strength has returned. Because you cannot feel it, you will need to take extra care to avoid injury. Because it may be weak, you may have difficulty moving it or using it. You may not know what position it is in without looking at it while the block is in effect. ° °3. For blocks in the legs and feet, returning to weight bearing and walking needs to be done carefully. You will need to wait until the numbness is entirely gone and the strength has returned. You should be able to move your leg and foot normally before you try and bear weight or walk. You will need someone to be with you when you first try to ensure you  do not fall and possibly risk injury. ° °4. Bruising and tenderness at the needle site are common side effects and will resolve in a few days. ° °5. Persistent numbness or new problems with movement should be communicated to the surgeon or the [facility] ([phone])/ [facility] ([phone]). °

## 2014-12-17 NOTE — Op Note (Signed)
12/17/2014  8:56 AM  PATIENT:  Erica Whitney  73 y.o. female  PRE-OPERATIVE DIAGNOSIS:  Right Breast Cancer  POST-OPERATIVE DIAGNOSIS:  Right Breast Cancer  PROCEDURE:  Procedure(s): RIGHT BREAST RADIOACTIVE SEED LOCALIZED LUMPECTOMY AND SENTINEL NODE MAPPING (Right)  SURGEON:  Surgeon(s) and Role:    * Jovita Kussmaul, MD - Primary  PHYSICIAN ASSISTANT:   ASSISTANTS: none   ANESTHESIA:   general  EBL:  Total I/O In: 1300 [I.V.:1300] Out: -   BLOOD ADMINISTERED:none  DRAINS: none   LOCAL MEDICATIONS USED:  MARCAINE     SPECIMEN:  Source of Specimen:  right breast tissue with additional medial margin and sentinel node  DISPOSITION OF SPECIMEN:  PATHOLOGY  COUNTS:  YES  TOURNIQUET:  * No tourniquets in log *  DICTATION: .Dragon Dictation  After informed consent was obtained the patient was brought to the operating room and placed in the supine position on the operating room table. After adequate induction of general anesthesia the patient's right chest, breast, and axillary areas were prepped with ChloraPrep, allowed to dry, and draped in usual sterile manner. On December 30 A I-125 radioactive seed was placed in the upper outer right breast mark the site of the cancer. Earlier in the day today 1 mCi of technetium sulfur colloid was injected into the subareolar position on the right. At this point attention was first turned to the axilla. A hot spot was identified using the neoprobe set on the technetium setting. A small transversely oriented incision was made with a 15 blade knife overlying this hot spot. This incision was carried through the skin and subcutaneous tissue sharply with the electrocautery until the axilla was entered. A small vessel was controlled with clips. Using the neoprobe to direct blunt hemostat dissection a lymph node was identified. The lymph node was excised sharply with the electrocautery and the lymphatics were controlled with clips. Once the lymph  node was removed and the ex vivo counts measured approximately 400. No other hot or palpable lymph nodes were identified in the right axilla. This lymph node was sent as sentinel node #1. The wound was infiltrated with quarter percent Marcaine. The deep layer of the wound was closed with interrupted 3-0 Vicryl stitches. The skin was then closed with a running 4-0 Monocryl subcuticular stitch. Attention was then turned to the right breast. An elliptical incision was made overlying the hot spot to include the biopsy site with a 15 blade knife. This area was localized with the neoprobe set on the iodine 125 setting. While checking the area of radioactivity frequently a circular portion of breast tissue was excised sharply with the electrocautery around the area of radioactivity. We did try to ere on the anterior and medial side of the specimen since the tumor was anterior and medial to the clip and seed. Once the specimen was removed it was oriented with the appropriate paint colors. A specimen radiograph was obtained that showed the clip and seed to be in the center of the specimen. Radioactivity was confirmed in the specimen. A lack of radioactivity was also confirmed in the breast. An additional medial margin was excised sharply with the electrocautery. This was also oriented with the appropriate paint colors. The wound was infiltrated with quarter percent Marcaine. Hemostasis was achieved using the Bovie electrocautery. The deep layer of the wound was then closed with interrupted 3-0 Vicryl stitches. The edge of the cavity and a vessel were all controlled with clips. The skin was then closed  with interrupted 4-0 Monocryl subcuticular stitches. Dermabond dressings were applied. The patient tolerated the procedure well. At the end of the case all needle sponge and instrument counts were correct. The patient was then awakened and taken to recovery in stable condition. The seed was confirmed in pathology.  PLAN OF  CARE: Discharge to home after PACU  PATIENT DISPOSITION:  PACU - hemodynamically stable.   Delay start of Pharmacological VTE agent (>24hrs) due to surgical blood loss or risk of bleeding: not applicable

## 2014-12-21 ENCOUNTER — Encounter (HOSPITAL_BASED_OUTPATIENT_CLINIC_OR_DEPARTMENT_OTHER): Payer: Self-pay | Admitting: General Surgery

## 2014-12-22 DIAGNOSIS — H04123 Dry eye syndrome of bilateral lacrimal glands: Secondary | ICD-10-CM | POA: Diagnosis not present

## 2014-12-30 DIAGNOSIS — E119 Type 2 diabetes mellitus without complications: Secondary | ICD-10-CM | POA: Diagnosis not present

## 2014-12-30 DIAGNOSIS — K219 Gastro-esophageal reflux disease without esophagitis: Secondary | ICD-10-CM | POA: Diagnosis not present

## 2014-12-30 DIAGNOSIS — I10 Essential (primary) hypertension: Secondary | ICD-10-CM | POA: Diagnosis not present

## 2014-12-30 DIAGNOSIS — D493 Neoplasm of unspecified behavior of breast: Secondary | ICD-10-CM | POA: Diagnosis not present

## 2014-12-30 DIAGNOSIS — Z23 Encounter for immunization: Secondary | ICD-10-CM | POA: Diagnosis not present

## 2015-01-07 ENCOUNTER — Encounter: Payer: Self-pay | Admitting: Hematology

## 2015-01-07 ENCOUNTER — Telehealth: Payer: Self-pay | Admitting: Hematology

## 2015-01-07 ENCOUNTER — Ambulatory Visit (HOSPITAL_BASED_OUTPATIENT_CLINIC_OR_DEPARTMENT_OTHER): Payer: 59 | Admitting: Hematology

## 2015-01-07 ENCOUNTER — Ambulatory Visit (HOSPITAL_BASED_OUTPATIENT_CLINIC_OR_DEPARTMENT_OTHER): Payer: 59

## 2015-01-07 VITALS — BP 146/69 | HR 78 | Temp 98.4°F | Resp 18 | Ht 63.0 in | Wt 190.0 lb

## 2015-01-07 DIAGNOSIS — Z170 Estrogen receptor positive status [ER+]: Secondary | ICD-10-CM | POA: Diagnosis not present

## 2015-01-07 DIAGNOSIS — G8929 Other chronic pain: Secondary | ICD-10-CM

## 2015-01-07 DIAGNOSIS — C50411 Malignant neoplasm of upper-outer quadrant of right female breast: Secondary | ICD-10-CM

## 2015-01-07 LAB — CBC WITH DIFFERENTIAL/PLATELET
BASO%: 0.3 % (ref 0.0–2.0)
Basophils Absolute: 0 10*3/uL (ref 0.0–0.1)
EOS%: 2.2 % (ref 0.0–7.0)
Eosinophils Absolute: 0.2 10*3/uL (ref 0.0–0.5)
HEMATOCRIT: 34.7 % — AB (ref 34.8–46.6)
HGB: 11 g/dL — ABNORMAL LOW (ref 11.6–15.9)
LYMPH#: 2.6 10*3/uL (ref 0.9–3.3)
LYMPH%: 34.7 % (ref 14.0–49.7)
MCH: 29.3 pg (ref 25.1–34.0)
MCHC: 31.7 g/dL (ref 31.5–36.0)
MCV: 92.5 fL (ref 79.5–101.0)
MONO#: 0.7 10*3/uL (ref 0.1–0.9)
MONO%: 9.9 % (ref 0.0–14.0)
NEUT#: 3.9 10*3/uL (ref 1.5–6.5)
NEUT%: 52.9 % (ref 38.4–76.8)
Platelets: 211 10*3/uL (ref 145–400)
RBC: 3.75 10*6/uL (ref 3.70–5.45)
RDW: 13.6 % (ref 11.2–14.5)
WBC: 7.3 10*3/uL (ref 3.9–10.3)

## 2015-01-07 LAB — COMPREHENSIVE METABOLIC PANEL (CC13)
ALBUMIN: 3.6 g/dL (ref 3.5–5.0)
ALK PHOS: 72 U/L (ref 40–150)
ALT: 14 U/L (ref 0–55)
ANION GAP: 8 meq/L (ref 3–11)
AST: 15 U/L (ref 5–34)
BILIRUBIN TOTAL: 0.32 mg/dL (ref 0.20–1.20)
BUN: 19.8 mg/dL (ref 7.0–26.0)
CALCIUM: 9.7 mg/dL (ref 8.4–10.4)
CO2: 26 meq/L (ref 22–29)
CREATININE: 0.8 mg/dL (ref 0.6–1.1)
Chloride: 109 mEq/L (ref 98–109)
EGFR: 84 mL/min/{1.73_m2} — AB (ref >90)
GLUCOSE: 118 mg/dL (ref 70–140)
Potassium: 4 mEq/L (ref 3.5–5.1)
Sodium: 143 mEq/L (ref 136–145)
Total Protein: 7.4 g/dL (ref 6.4–8.3)

## 2015-01-07 MED ORDER — EXEMESTANE 25 MG PO TABS: 25.0000 mg | ORAL_TABLET | Freq: Every day | ORAL | Status: DC

## 2015-01-07 NOTE — Telephone Encounter (Signed)
Gave avs & calendar for February. °

## 2015-01-07 NOTE — Progress Notes (Signed)
[facility] NOTE  Patient Care Team: Philis Fendt, MD as PCP - General (Internal Medicine) Autumn Messing III, MD as Consulting Physician (General Surgery) Truitt Merle, MD as Consulting Physician (Hematology) Thea Silversmith, MD as Consulting Physician (Radiation Oncology) Trinda Pascal, NP as Nurse Practitioner (Nurse Practitioner)    Breast cancer of upper-outer quadrant of right female breast   10/26/2014 Imaging screening mammogram showed a 65m mass in right breast at 10 o'clock position.    11/05/2014 Pathology Results Biopsy showed grade 2 IDA, ER 90%+, PR 10%+, HER2-, with grade 1 DCIS    11/06/2014 Initial Diagnosis Breast cancer of upper-outer quadrant of right female breast    CHIEF COMPLAINTS/PURPOSE OF CONSULTATION:  Breast cancer   HISTORY OF PRESENTING ILLNESS:  Ryian J Sugg 74y.o. female is here because of recent diagnosed breast cancer.  She had routine mammogram screening on 10/26/2014, which showed 5 mm mass in the right breast 10:00 position. She denies any pain or any new symptoms. 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 Ki-67 15%. She had breast MRI on 11/24 2015, which showed a 7 mm enhancing lesion in the right breast biopsy site.  She lives alone, able to take care of her daily needs independently, but she is not very physically active overall. She has chronic bilateral knee pain arm pain from arthritis, and she and appetite is moderate. She has insomnia and hot flashes. She denies any other new symptoms. She does not drive, her daughter lives close to her and presents to clinic with her today.   INTERIM HISTORY: She returns for follow up. She had lumpectomy on 12/17/14 and tolerated it very well. She recovered quickly, did not need any pain medication. No right arm swollen or movement limitation. She has pre-much back to  her usual status. She has chronic back and joint pain, no other new pain or other complains.    MEDICAL HISTORY:  Past Medical History  Diagnosis Date  . Hypertension   . Diabetes mellitus   . Hypercholesteremia   . Peptic ulcer   . Arthritis   . Insomnia   . Depression   . 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 Abdominal hysterectomy  1995  . Back surgery  2000    lumb lam  . Colonoscopy    . Orif metacarpal fracture  2012    left  . Radioactive seed guided mastectomy with axillary sentinel lymph node biopsy Right 12/17/2014    Procedure: RIGHT BREAST RADIOACTIVE SEED LOCALIZED LUMPECTOMY AND SENTINEL NODE MAPPING;  Surgeon: PAutumn MessingIII, MD;  [facility]  Service: General;  Laterality: Right;  Lumber spine surgery 15 years   SOCIAL HISTORY: History   Social History  . Marital Status: Single    Spouse Name: N/A    Number of Children: 4, 2 girls, 2 boys, age 73-52  . Years of Education: N/A  She lives alone, her daughter lives close to her   Occupational History   Cleaning     Social History Main Topics  . Smoking status: Never Smoker   . Smokeless tobacco: Current User    Types: Snuff  . Alcohol Use: No  . Drug Use: No  . Sexual Activity:  Not on file     FAMILY HISTORY: Mother had lung cancer at age of 87 Sister had breast ca at age of 57 Brother had lung cancer at 69 No other family history of malignancy.  ALLERGIES:  has No Known Allergies.  MEDICATIONS:  Current Outpatient Prescriptions  Medication Sig Dispense Refill  . aspirin EC 81 MG tablet Take 81 mg by mouth daily.      Marland Kitchen atorvastatin (LIPITOR) 40 MG tablet Take 40 mg by mouth daily.      . Calcium Carbonate-Vitamin D (CALCIUM 600+D) 600-400 MG-UNIT per tablet Take 1 tablet by mouth daily.      .  cycloSPORINE (RESTASIS) 0.05 % ophthalmic emulsion Place 1 drop into both eyes 2 (two) times daily.      Marland Kitchen glimepiride (AMARYL) 4 MG tablet Take 4 mg by mouth 2 (two) times daily with a meal.      . lisinopril-hydrochlorothiazide (PRINZIDE,ZESTORETIC) 10-12.5 MG per tablet Take 1 tablet by mouth daily.      . metFORMIN (GLUCOPHAGE) 500 MG tablet Take 1 tablet (500 mg total) by mouth 2 (two) times daily with a meal. (Patient taking differently: Take 1,000 mg by mouth 2 (two) times daily with a meal. ) 20 tablet 0  . NIFEdipine (PROCARDIA XL/ADALAT-CC) 60 MG 24 hr tablet Take 60 mg by mouth daily.      Marland Kitchen oxyCODONE-acetaminophen (ROXICET) 5-325 MG per tablet Take 1-2 tablets by mouth every 4 (four) hours as needed. 50 tablet 0  . sertraline (ZOLOFT) 100 MG tablet Take 100 mg by mouth every morning.      . Turmeric 450 MG CAPS Take 1 capsule by mouth daily.      . vitamin C (ASCORBIC ACID) 500 MG tablet Take 500 mg by mouth daily.      Marland Kitchen zolpidem (AMBIEN) 10 MG tablet Take 5 mg by mouth at bedtime as needed. She takes four nights a week for sleep.     . meloxicam (MOBIC) 7.5 MG tablet     . omeprazole (PRILOSEC) 20 MG capsule Take 20 mg by mouth 2 (two) times daily.      . pantoprazole (PROTONIX) 40 MG tablet Take 40 mg by mouth daily.    Marland Kitchen triamcinolone cream (KENALOG) 0.5 %      No current facility-administered medications for this visit.    REVIEW OF SYSTEMS:   Constitutional: Denies fevers, chills or abnormal night sweats Eyes: Denies blurriness of vision, double vision or watery eyes Ears, nose, mouth, throat, and face: Denies mucositis or sore throat Respiratory: Denies cough, dyspnea or wheezes Cardiovascular: Denies palpitation, chest discomfort or lower extremity swelling Gastrointestinal:  Denies nausea, heartburn or change in bowel habits Skin: Denies abnormal skin rashes Lymphatics: Denies new lymphadenopathy or easy bruising Neurological:Denies numbness, tingling or new  weaknesses Behavioral/Psych: Mood is stable, no new changes  All other systems were reviewed with the patient and are negative. (+) joints pain in knees and arms   PHYSICAL EXAMINATION: ECOG PERFORMANCE STATUS: 0 - Asymptomatic  Filed Vitals:   01/07/15 1358  BP: 146/69  Pulse: 78  Temp: 98.4 F (36.9 C)  Resp: 18   Filed Weights   01/07/15 1358  Weight: 190 lb (86.183 kg)    GENERAL:alert, no distress and comfortable SKIN: skin color, texture, turgor are normal, no rashes or significant lesions EYES: normal, conjunctiva are pink and non-injected, sclera clear OROPHARYNX:no exudate, no erythema and lips, buccal mucosa, and tongue normal  NECK: supple, thyroid normal size,  non-tender, without nodularity LYMPH:  no palpable lymphadenopathy in the cervical, axillary or inguinal LUNGS: clear to auscultation and percussion with normal breathing effort HEART: regular rate & rhythm and no murmurs and no lower extremity edema ABDOMEN:abdomen soft, non-tender and normal bowel sounds Musculoskeletal:no cyanosis of digits and no clubbing  PSYCH: alert & oriented x 3 with fluent speech NEURO: no focal motor/sensory deficits  LABORATORY DATA:  I have reviewed the data as listed Lab Results  Component Value Date   WBC 7.3 11/11/2014   HGB 12.6 12/17/2014   HCT 37.0 12/17/2014   MCV 94.7 11/11/2014   PLT 200 11/11/2014    Recent Labs  11/11/14 0838 12/17/14 0644  NA 141 143  K 4.5 4.0  CL  --  108  CO2 24  --   GLUCOSE 136 157*  BUN 32.2* 26*  CREATININE 1.0 0.90  CALCIUM 10.5*  --   PROT 7.8  --   ALBUMIN 3.9  --   AST 12  --   ALT 12  --   ALKPHOS 67  --   BILITOT 0.43  --      PATHOLOGY REPORT 1. Breast, partial mastectomy, Right 12/17/2014 - INVASIVE DUCTAL CARCINOMA, GRADE II/III, SPANNING 1.1 CM. - DUCTAL CARCINOMA IN SITU, INTERMEDIATE GRADE. - THE SURGICAL RESECTION MARGINS OF SPECIMEN #1 ARE NEGATIVE FOR CARCINOMA. - SEE ONCOLOGY TABLE BELOW. 2. Lymph  node, sentinel, biopsy, right axilla - THERE IS NO EVIDENCE OF CARCINOMA IN 1 OF 1 LYMPH NODE (0/1). 1 of 3 Duplicate copy FINAL for Colter, GLENN CHRISTO [phone]) Diagnosis(continued) 3. Breast, biopsy, additional right medial margin - INVASIVE DUCTAL CARCINOMA. - DUCTAL CARCINOMA IN SITU. - DUCTAL CARCINOMA IN SITU IS FOCALLY LESS THAN 0.1 CM TO THE MEDIAL MARGIN OF SPECIMEN #3.  Specimen, including laterality and lymph node sampling (sentinel, non-sentinel): Right breast and right axillary sentinel node. Procedure: Seed localized lumpectomy, additional medial margin resection, and sentinel lymph node resection x1. Histologic type: Ductal. Grade: II. Tubule formation: 3 Nuclear pleomorphism: 2 Mitotic: 1 Tumor size (gross measurement): 1.1 cm. Margins: Invasive, distance to closest margin: Greater than 0.2 cm to all margins. In-situ, distance to closest margin: Focally less than 0.1 cm to the medial margin of specimen #3. Lymphovascular invasion: Not identified. Ductal carcinoma in situ: Present. Grade: Intermediate. Extensive intraductal component: Yes. Lobular neoplasia: Not identified. Tumor focality: Unifocal. Treatment effect: N/A. Extent of tumor: Confined to breast parenchyma. Lymph nodes: Examined: 1 Sentinel 0 Non-sentinel 1 Total Lymph nodes with metastasis: 0. Breast prognostic profile: [phone]. Estrogen receptor: 100%, strong staining intensity. Progesterone receptor: 6%, strong staining intensity. Her 2 neu: No amplification was detected. The ratio was 1.52. Her 2 neu by [facility] will be repeated on the current case and the results reported separately. Ki-67: 18%. Non-neoplastic breast: Healing biopsy site. TNM: pT1c, pN0. (JBK:ds 12/21/14)  Results: HER-2/NEU BY [facility] - NO AMPLIFICATION OF HER-2 DETECTED. RESULT RATIO OF HER2: CEP 17 SIGNALS 1.11 AVERAGE HER2 COPY NUMBER PER CELL 2.10 - RADIOGRAPHIC STUDIES: I have personally reviewed the  radiological images as listed and agreed with the findings in the report.  Mr Breast Bilateral W Wo Contrast 11/10/2014    FINDINGS:  Breast composition: b.  Scattered fibroglandular tissue.  Background parenchymal enhancement: Mild to moderate  Right breast: There is considerable post biopsy change related to the recent right breast ultrasound-guided core biopsy. The clip is located 2.3 cm posterior and slightly lateral with respect to the small irregular enhancing mass located within the  upper outer quadrant of the right breast (middle 1/3). This measures 7 x 7 x 5 mm in size and is associated with a mixture of plateau and washout enhancement kinetics. There are no additional areas of worrisome enhancement within the right breast.  Left breast: No mass or abnormal enhancement.  Lymph nodes: No abnormal appearing lymph nodes.  Ancillary findings:  None.    IMPRESSION:  The clip is located 2.3 cm posterior and slightly lateral with respect of the small (7 mm) irregular enhancing mass located within the upper outer quadrant of the right breast (middle 1/3). Post biopsy change within the right breast as discussed above. No additional findings and no evidence for adenopathy.    Mm Digital Diagnostic Unilat R 10/26/2014 IMPRESSION:  5 mm mass at 10 o'clock position right breast is suspicious for malignancy.     US Breast Ltd Uni Right Inc Axilla 10/26/2014   IMPRESSION:  5 mm mass at 10 o'clock position right breast is suspicious for malignancy.   RECOMMENDATION: Ultrasound-guided biopsy is recommended and has been scheduled for November 04, 2014 at 1 o'clock p.m.    ASSESSMENT & PLAN:  Ms Dossantos is a 74 year old female with multiple comorbidities, including hypertension, diabetes, arthritis with chronic pain, depression and insomnia, who was recently found to have 5-7 mm right breast mass at 10 o'clock position.   1. 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 -She is status post right lumpectomy and sentinel lymph node biopsy, with negative surgical margins. I reviewed her surgical pathology results with patient and her daughter.   -Per pt, Dr. Pablo Ledger did not recommend adjuvant radiation -I strongly recommend her to consider adjuvant AI for a total of 5 years to reduce her risks of cancer recurrence. Potential side effects of AI, which includes but not limited to, hot flash, vaginal dryness, musculoskeletal pain and stiffness, osteoporosis, was explained to patient in great detail. She agreed to proceed. I'll send a prescription of Aromasin to her pharmacy today. She will start in 1 week. -I again briefly discussed the role of Oncotype to predicted the benefit of adjuvant chemotherapy in ER/PR positive node-negative breast cancer. She is very hesitated to consider chemotherapy, and also due to the small size of her tumor, I would not send Oncotype test. -I'll obtain baseline bone density scan in the next few weeks. -I encouraged her to do regular exercise. -Calcium and vitamin D supplement for bone health.  2. HTN, DM -She will continue her medication and a follow-up with her primary care physician.  PLAN: -Start Aromasin daily in one week -Bone density scan -RTC in 5-6 weeks   All questions were answered. The patient knows to call the clinic with any problems, questions or concerns. I spent 20 minutes counseling the patient face to face. The total time spent in the appointment was 30 minutes and more than 50% was on counseling.     Truitt Merle, MD 01/07/2015 2:16 PM     lab

## 2015-01-12 ENCOUNTER — Telehealth: Payer: Self-pay | Admitting: Adult Health

## 2015-01-12 NOTE — Telephone Encounter (Signed)
I called Ms. Alvarenga to try to schedule her [facility] appointment with me.  I left a voicemail asking her to give me a call.    Of note, per Dr. Pablo Ledger, Ms. Jasko does not need adjuvant radiation and will receive adjuvant anti-estrogen treatment only under the supervision of Dr. Burr Medico.   Mike Craze, NP [place] [phone]

## 2015-01-12 NOTE — Telephone Encounter (Signed)
I called Ms. Leiner's daughter to try to schedule an appointment in the [facility].  There were no identifiers on her daughter's voicemail, so I did not leave a message.  Will try again another time.   Mike Craze, NP [place] [phone]

## 2015-01-13 ENCOUNTER — Ambulatory Visit (HOSPITAL_BASED_OUTPATIENT_CLINIC_OR_DEPARTMENT_OTHER): Payer: 59 | Admitting: Adult Health

## 2015-01-13 ENCOUNTER — Ambulatory Visit
Admission: RE | Admit: 2015-01-13 | Discharge: 2015-01-13 | Disposition: A | Discharge status: Home / Self Care | Payer: 59 | Source: Ambulatory Visit | Attending: Radiation Oncology | Admitting: Radiation Oncology

## 2015-01-13 ENCOUNTER — Telehealth: Payer: Self-pay | Admitting: Adult Health

## 2015-01-13 ENCOUNTER — Encounter: Payer: Self-pay | Admitting: Adult Health

## 2015-01-13 ENCOUNTER — Ambulatory Visit: Payer: 59

## 2015-01-13 VITALS — BP 148/77 | HR 88 | Temp 98.3°F | Resp 14

## 2015-01-13 DIAGNOSIS — Z170 Estrogen receptor positive status [ER+]: Secondary | ICD-10-CM | POA: Diagnosis not present

## 2015-01-13 DIAGNOSIS — M8580 Other specified disorders of bone density and structure, unspecified site: Secondary | ICD-10-CM | POA: Diagnosis not present

## 2015-01-13 DIAGNOSIS — C50411 Malignant neoplasm of upper-outer quadrant of right female breast: Secondary | ICD-10-CM | POA: Diagnosis not present

## 2015-01-13 DIAGNOSIS — D499 Neoplasm of unspecified behavior of unspecified site: Secondary | ICD-10-CM

## 2015-01-13 NOTE — Telephone Encounter (Signed)
During our [facility] visit today, Ms. Erica Whitney and her daughter expressed concern that she only received 8 pills of her Aromasin from her Investment banker, corporate, [employer] [phone]).  I called the pharmacy on behalf of the patient to inquire.  The pharmacist let me know that this is their policy to release only a few pills of a new prescription in order to coordinate/schedule the other refills that will be delivered to the patient's home.  I called the patient's daughter and left her a voicemail detailing what the pharmacist told me.  She was encouraged to call me with any questions or concerns.   Mike Craze, NP [place] [phone]

## 2015-01-13 NOTE — Progress Notes (Signed)
CLINIC:  Cancer Survivorship   REASON FOR VISIT:  Routine follow-up post-treatment for a recent history of breast cancer.  HISTORY OF PRESENT ILLNESS:  Erica Whitney is a very pleasant 74 y.o. 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  Her oncologic history dates back to 10/2014 when she underwent a screening mammogram revealing a 59m mass in the right breast.  Biopsy of this right breast mass revealed grade 2 invasive ductal carcinoma, ER positive, PR positive, HER2/neu negative, Ki67 of 15%, as well as grade 1 DCIS   [facility] diagnosis of breast cancer[facility].  She underwent right lumpectomy with sentinel lymph node biopsy on 12/17/2014 with surgical pathology revealing grade 2 IDC spanning 1.1 cm with associated grade 2 DCIS.  HER2 was repeated with surgical specimen and remained negative. Surgical margins were clear. Per the recommendations of Dr. FBurr Medico she will begin anti-estrogen therapy with Aromasin on 01/14/2015.  Her planned duration of anti-estrogen therapy is 5 years.  [facility] our initial meeting to review her survivorship care plan detailing her treatment course for breast cancer, as well as monitoring long-term side effects of that treatment, education regarding health maintenance, screening, and overall wellness and health promotion.     Overall, [name] feeling quite well since completing her breast surgery about one month ago.  She reports having intermittent hot flashes, about 5-6 per day.  She reports that they last a few minutes and then go away.  She also reports having some difficulty sleeping from time-to-time, but this is generally relieved with her prescription for prn Ambien.  She reports occasional "sharp, quick" pain in her  right breast, which does not require any pain medications or intervention.   REVIEW OF SYSTEMS:  General: Denies fever, chills, unintentional weight loss, or generalized fatigue.  Cardiac: Denies palpitations, chest pain, and lower extremity edema.  Respiratory: Denies cough, shortness of breath, and dyspnea on exertion.  GI: Denies abdominal pain, constipation, diarrhea, nausea, or vomiting.  GU: Denies dysuria, hematuria, vaginal bleeding, vaginal discharge, or vaginal dryness.  Musculoskeletal: Denies joint or bone pain.  Neuro: Denies headache or recent falls. Denies peripheral neuropathy. Skin: Denies rash, pruritis, or open wounds.  Breast: Denies any new nodularity, masses, tenderness, nipple changes, or nipple discharge.  Psych: Denies depression, anxiety.  Memory loss is chronic; patient is followed by an outside provider for dementia.    A 14-point review of systems was completed and was negative, except as noted above.  BRIEF ONCOLOGIC HISTORY:    Breast cancer of upper-outer quadrant of right female breast   10/26/2014 Imaging screening mammogram showed a 457mmass in right breast at 10 o'clock position.    11/05/2014 Pathology Results Biopsy showed grade 2 IDC, ER 90%+, PR 10%+, HER2-, with grade 1 DCIS    11/06/2014 Initial Diagnosis Breast cancer of upper-outer quadrant of right female breast   12/17/2014 Definitive Surgery Right lumpectomy with SLNB revealed grade 2, IDC spanning 1.1 cm; associated grade 2 DCIS. HER2 repeated and remains negative. Surgical margins clear.    12/17/2014 Pathologic Stage [id number]; Stage IA   01/07/2015 -  Anti-estrogen oral therapy Patient to start Aromasin on 01/14/2015.  Planned duration of therapy is 5 years. (End date: 12/2019).   01/12/2015 Survivorship Patient eligible for Survivorship after having completed all  anti-cancer treatments (with exception of anti-estrogen) and currently NED.     ONCOLOGY TREATMENT TEAM:  1. Surgeon:  Dr. Marlou Starks  at [facility] 2. Medical Oncologist: Dr. Burr Medico     PAST MEDICAL/SURGICAL HISTORY:  Past Medical History  Diagnosis Date  . Hypertension   . Diabetes mellitus   . Hypercholesteremia   . Peptic ulcer   . Arthritis   . Insomnia   . Depression   . Breast cancer of upper-outer quadrant of right female breast 11/06/2014  . Uterine cancer     dx in her 69s  . Wears glasses    Past Surgical History  Procedure Laterality Date  . Abdominal hysterectomy  1995  . Back surgery  2000    lumb lam  . Colonoscopy    . Orif metacarpal fracture  2012    left  . Radioactive seed guided mastectomy with axillary sentinel lymph node biopsy Right 12/17/2014    Procedure: RIGHT BREAST RADIOACTIVE SEED LOCALIZED LUMPECTOMY AND SENTINEL NODE MAPPING;  Surgeon: Autumn Messing III, MD;  Location: [facility];  Service: General;  Laterality: Right;     HEALTH MAINTENANCE/WELLNESS HISTORY:  1. Last colonoscopy: 2015, revealed several polyps. Recommended repeat exam in 5-10 years. 2. Flu vaccine given: 2015, up-to-date 3. Pneumovax vaccine given: Pt unsure if she received this vaccine or not; she is interested and eligible if she has not received it.  4. Shingles vaccine given: 2015, per patient; up-to-date 5. Last DEXA scan: 10/03/2005, results were osteopenia in left femur, normal in spine & right femur.   CURRENT MEDICATIONS:  Outpatient Encounter Prescriptions as of 01/13/2015  Medication Sig Note  . aspirin EC 81 MG tablet Take 81 mg by mouth daily.     Marland Kitchen atorvastatin (LIPITOR) 40 MG tablet Take 40 mg by mouth daily.     . Calcium Carbonate-Vitamin D (CALCIUM 600+D) 600-400 MG-UNIT per tablet Take 1 tablet by mouth daily.     . cycloSPORINE (RESTASIS) 0.05 % ophthalmic emulsion Place 1 drop into both eyes 2 (two) times daily.     Marland Kitchen exemestane (AROMASIN) 25 MG tablet Take 1 tablet (25 mg total) by mouth daily after breakfast.   . glimepiride (AMARYL) 4 MG tablet Take 4 mg by  mouth 2 (two) times daily with a meal.     . lisinopril-hydrochlorothiazide (PRINZIDE,ZESTORETIC) 10-12.5 MG per tablet Take 1 tablet by mouth daily.     . metFORMIN (GLUCOPHAGE) 500 MG tablet Take 1 tablet (500 mg total) by mouth 2 (two) times daily with a meal. (Patient taking differently: Take 1,000 mg by mouth 2 (two) times daily with a meal. )   . NIFEdipine (PROCARDIA XL/ADALAT-CC) 60 MG 24 hr tablet Take 60 mg by mouth daily.     . pantoprazole (PROTONIX) 40 MG tablet Take 40 mg by mouth daily. 01/07/2015: Received from: External Pharmacy Received Sig:   . sertraline (ZOLOFT) 100 MG tablet Take 100 mg by mouth every morning.     . triamcinolone cream (KENALOG) 0.5 %  01/07/2015: Received from: External Pharmacy  . vitamin C (ASCORBIC ACID) 500 MG tablet Take 500 mg by mouth daily.     Marland Kitchen zolpidem (AMBIEN) 10 MG tablet Take 5 mg by mouth at bedtime as needed. She takes four nights a week for sleep.    . [DISCONTINUED] meloxicam (MOBIC) 7.5 MG tablet  01/13/2015: pt not taking; not helpful for arthritis pain  . [DISCONTINUED] omeprazole (PRILOSEC) 20 MG capsule Take 20 mg by  mouth 2 (two) times daily.     . [DISCONTINUED] oxyCODONE-acetaminophen (ROXICET) 5-325 MG per tablet Take 1-2 tablets by mouth every 4 (four) hours as needed.   . [DISCONTINUED] Turmeric 450 MG CAPS Take 1 capsule by mouth daily.        ONCOLOGIC FAMILY HISTORY:  1. Mother: Stomach cancer at age 32. 2. Brother: Lung cancer at age 4.  36. Sister: Breast cancer at age 73.   SOCIAL HISTORY:  Erica Whitney is single and lives alone in [place], [place].  She has 4 children (2 daughters and 2 sons), who all live in [place] and very close to Erica Whitney. Erica Whitney is currently retired, but previously worked for the [employer] in [place].  She denies any current or history alcohol or illicit drug use.  She is a former smoker, but quit in  2012.  She enjoys reading, particularly the Bible or any spirituality books. She also loves to color, paint, or do any arts and crafts. She also loves to walk and be as active as she can tolerate.  PHYSICAL EXAMINATION:  Vital Signs:   Filed Vitals:   01/13/15 1447  BP: 148/77  Pulse: 88  Temp: 98.3 F (36.8 C)  Resp: 14   General: Well-nourished, well-appearing female in no acute distress.  She is accompanied in clinic by her daughter today.   HEENT: Head is atraumatic and normocephalic.  Pupils equal and reactive to light and accomodation. Conjunctivae clear without exudate.  Sclerae anicteric. Oral mucosa is pink and moist. Poor dentition.  Oropharynx is pink without lesions or erythema.  Lymph: No cervical, supraclavicular, infraclavicular, or axillary lymphadenopathy noted on palpation.  Cardiovascular: Regular rate and rhythm without murmurs, rubs, or gallops. Respiratory: Clear to auscultation bilaterally. Chest expansion symmetric without accessory muscle use on inspiration or expiration.  Breast: Right breast with healed scars x 2 on the lateral side of the breast.  Scar tissue palpated at the area of surgery. No nodularities, skin changes, or nipple discharge in either breast.   GI: Abdomen soft and round. No tenderness to palpation. Bowel sounds normoactive in 4 quadrants. No hepatosplenomegaly.   GU: Deferred.  Musculoskeletal: Muscle strength 4/5 in all extremities.  Full ROM noted in all extremities.  Neuro: No focal deficits. Steady gait.  Psych: Mood and affect normal and appropriate for situation.  Extremities: No edema, cyanosis, or clubbing.  Skin: Warm and dry. No open lesions noted.   LABORATORY DATA:  None for this visit.   DIAGNOSTIC IMAGING:  None for this visit.      ASSESSMENT AND PLAN:   1. History of breast cancer:  Erica Whitney will follow-up with her medical oncologist, Dr. Burr Medico in her clinic in 01/2015 with physical exam per surveillance protocol.   She will begin her anti-estrogen therapy with Aromasin as prescribed by Dr. Burr Medico. She was instructed to make Dr. Burr Medico or myself aware if she begins to experience any side effects of the medication and I could see her back in clinic to help manage those side effects, as needed. Potential side effects of Aromasin were again reviewed with her as well.  A comprehensive survivorship care plan and treatment summary was reviewed with the patient today detailing her breast cancer diagnosis, treatment course, potential late/long-term effects of treatment, appropriate follow-up care with recommendations for the future, and patient education resources.  A copy of this summary, along with a letter will be  sent to the patient's primary care provider, will be routed after today's visit.  Erica Whitney is welcome to return to the Survivorship Clinic in the future, as needed; no follow-up will be scheduled at this time.    2. Hot flashes: Erica Whitney has not started the Aromasin therapy yet; she plans to initiate treatment beginning tomorrow, 01/14/15 per Erica Whitney recommendations. Therefore, it is not possible for her complaints of periodic hot flashes to be related to anti-estrogen therapy.  She was encouraged to modify her environment and/or behaviors to avoid triggers that could be leading to hot flashes.   3. Breast pain: This is likely due to her recent surgery and the healing process.  There is nothing on physical exam to suggest a need for further work-up at this time for the breast pain.  She was offered reassurance that breast pain after surgery is very common and will likely improve with time. She was encouraged to make [place] aware if the pain changed in frequency or severity, or if she noticed any physical changes in her breast and we could see her back to evaluate.   4. Bone health:  Given Erica Whitney's age, history of breast cancer and her current treatment regimen including anti-estrogen therapy with Aromasin, she  is at risk for bone demineralization.  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  Dr. Burr Medico has ordered a DEXA scan to evaluate her current bone health.  Erica Whitney was encouraged to increase her consumption of foods rich in calcium, as well as increase her weight-bearing activities.  She was given education on specific activities to promote bone health.  5. Cancer screening:  Due to Erica Whitney's history and her age, she should receive screening for skin cancers, colon cancer, and gynecologic cancers.  The information and recommendations are listed on the patient's comprehensive care plan/treatment summary and were reviewed in detail with the patient.    5. Health maintenance and wellness promotion: Erica Whitney was encouraged to consume 5-7 servings of fruits and vegetables per day. She was also encouraged to engage in moderate to vigorous exercise for 30 minutes per day most days of the week. She was instructed to limit her alcohol consumption and continue to abstain from tobacco use.  Her vaccinations are currently up-to-date, with the exception of the Pneumovax which she is unsure if she received in the past or not.   She would be eligible and is interested in receiving the vaccine if not previously given in past.    6. Support services/counseling: It is not uncommon for this period of the patient's cancer care trajectory to be one of many emotions and stressors.  Erica Whitney was encouraged to take advantage of our many support services programs, support groups, and/or counseling in coping with her new life as a cancer survivor after completing anti-cancer treatment.  She was offered support today through active listening and expressive supportive counseling.  She was given information regarding our available services and encouraged to contact me with any questions or for help enrolling in any of our support group/programs.  She is interested in pursuing the [employer] through our [employer].  I will contact the [employer] Coordinator to arrange this referral for her.  Because Ms. Hartzell enjoys being creative in [employer], she was encouraged to pursue [employer] and their programs available for cancer survivors in our community. She was given information on how  to contact them and get set-up.    A total of 90 minutes of face-to-face time was spent with this patient with greater than 50% of that time in counseling and care-coordination.   Mike Craze, NP Survivorship Program [place] [phone]   Note: PRIMARY CARE PROVIDER Philis Fendt, [place] [phone]

## 2015-02-03 DIAGNOSIS — E119 Type 2 diabetes mellitus without complications: Secondary | ICD-10-CM | POA: Diagnosis not present

## 2015-02-09 ENCOUNTER — Encounter: Payer: Self-pay | Admitting: Hematology

## 2015-02-09 ENCOUNTER — Other Ambulatory Visit (HOSPITAL_BASED_OUTPATIENT_CLINIC_OR_DEPARTMENT_OTHER): Payer: 59

## 2015-02-09 ENCOUNTER — Ambulatory Visit (HOSPITAL_BASED_OUTPATIENT_CLINIC_OR_DEPARTMENT_OTHER): Payer: 59 | Admitting: Hematology

## 2015-02-09 ENCOUNTER — Telehealth: Payer: Self-pay | Admitting: Hematology

## 2015-02-09 VITALS — BP 155/73 | HR 81 | Temp 98.4°F | Resp 18 | Ht 63.0 in | Wt 191.6 lb

## 2015-02-09 DIAGNOSIS — C50411 Malignant neoplasm of upper-outer quadrant of right female breast: Secondary | ICD-10-CM | POA: Diagnosis not present

## 2015-02-09 DIAGNOSIS — G8929 Other chronic pain: Secondary | ICD-10-CM

## 2015-02-09 DIAGNOSIS — Z170 Estrogen receptor positive status [ER+]: Secondary | ICD-10-CM | POA: Diagnosis not present

## 2015-02-09 LAB — CBC WITH DIFFERENTIAL/PLATELET
BASO%: 0.2 % (ref 0.0–2.0)
Basophils Absolute: 0 10*3/uL (ref 0.0–0.1)
EOS ABS: 0.2 10*3/uL (ref 0.0–0.5)
EOS%: 1.9 % (ref 0.0–7.0)
HEMATOCRIT: 35 % (ref 34.8–46.6)
HGB: 11.2 g/dL — ABNORMAL LOW (ref 11.6–15.9)
LYMPH#: 3 10*3/uL (ref 0.9–3.3)
LYMPH%: 37.6 % (ref 14.0–49.7)
MCH: 29.9 pg (ref 25.1–34.0)
MCHC: 32 g/dL (ref 31.5–36.0)
MCV: 93.3 fL (ref 79.5–101.0)
MONO#: 0.8 10*3/uL (ref 0.1–0.9)
MONO%: 10 % (ref 0.0–14.0)
NEUT#: 4.1 10*3/uL (ref 1.5–6.5)
NEUT%: 50.3 % (ref 38.4–76.8)
PLATELETS: 201 10*3/uL (ref 145–400)
RBC: 3.75 10*6/uL (ref 3.70–5.45)
RDW: 13.9 % (ref 11.2–14.5)
WBC: 8.1 10*3/uL (ref 3.9–10.3)

## 2015-02-09 LAB — COMPREHENSIVE METABOLIC PANEL (CC13)
ALBUMIN: 3.6 g/dL (ref 3.5–5.0)
ALT: 13 U/L (ref 0–55)
ANION GAP: 9 meq/L (ref 3–11)
AST: 14 U/L (ref 5–34)
Alkaline Phosphatase: 76 U/L (ref 40–150)
BUN: 24.8 mg/dL (ref 7.0–26.0)
CALCIUM: 9.9 mg/dL (ref 8.4–10.4)
CHLORIDE: 111 meq/L — AB (ref 98–109)
CO2: 27 meq/L (ref 22–29)
Creatinine: 0.8 mg/dL (ref 0.6–1.1)
EGFR: 82 mL/min/{1.73_m2} — ABNORMAL LOW (ref >90)
Glucose: 97 mg/dl (ref 70–140)
POTASSIUM: 4.8 meq/L (ref 3.5–5.1)
SODIUM: 147 meq/L — AB (ref 136–145)
TOTAL PROTEIN: 7.4 g/dL (ref 6.4–8.3)
Total Bilirubin: 0.29 mg/dL (ref 0.20–1.20)

## 2015-02-09 NOTE — Progress Notes (Signed)
[facility] NOTE  Patient Care Team: Philis Fendt, MD as PCP - General (Internal Medicine) Autumn Messing III, MD as Consulting Physician (General Surgery) Truitt Merle, MD as Consulting Physician (Hematology) Thea Silversmith, MD as Consulting Physician (Radiation Oncology) Trinda Pascal, NP as Nurse Practitioner (Nurse Practitioner)    Breast cancer of upper-outer quadrant of right female breast   10/26/2014 Imaging screening mammogram showed a 24m mass in right breast at 10 o'clock position.    11/05/2014 Pathology Results Biopsy showed grade 2 IDC, ER 90%+, PR 10%+, HER2-, with grade 1 DCIS    11/06/2014 Initial Diagnosis Breast cancer of upper-outer quadrant of right female breast   12/17/2014 Definitive Surgery Right lumpectomy with SLNB revealed grade 2, IDC spanning 1.1 cm; associated grade 2 DCIS. HER2 repeated and remains negative. Surgical margins clear.    12/17/2014 Pathologic Stage pT1cpN0M0; Stage IA   01/07/2015 -  Anti-estrogen oral therapy Patient to start Aromasin on 01/14/2015.  Planned duration of therapy is 5 years. (End date: 12/2019).   01/12/2015 Survivorship Patient eligible for Survivorship after having completed all anti-cancer treatments (with exception of anti-estrogen) and currently NED.    CHIEF COMPLAINTS/PURPOSE OF CONSULTATION:  Breast cancer   HISTORY OF PRESENTING ILLNESS:  Erica Whitney 74y.o. female is here because of recent diagnosed breast cancer.  She had routine mammogram screening on 10/26/2014, which showed 5 mm mass in the right breast 10:00 position. She denies any pain or any new symptoms. 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 Ki-67 15%. She had breast MRI on 11/24 2015, which showed a 7 mm enhancing lesion in the right breast biopsy site.  She lives alone, able to take care of her daily needs  independently, but she is not very physically active overall. She has chronic bilateral knee pain arm pain from arthritis, and she and appetite is moderate. She has insomnia and hot flashes. She denies any other new symptoms. She does not drive, her daughter lives close to her and presents to clinic with her today.   INTERIM HISTORY: She returns for follow up. She started Aromasin on 01/18/2015 and tolerated i tvery well. She has less hot flush since she started, and no other noticeable side effects. She still has some soreness at the right breast surgical site, she does not need pain meds. She has good appetite and energy level. No other complains.     MEDICAL HISTORY:  Past Medical History  Diagnosis Date  . Hypertension   . Diabetes mellitus   . Hypercholesteremia   . Peptic ulcer   . Arthritis   . Insomnia   . Depression   . 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 Abdominal hysterectomy  1995  . Back surgery  2000    lumb lam  . Colonoscopy    . Orif metacarpal fracture  2012    left  . Radioactive seed guided mastectomy with axillary sentinel lymph node biopsy Right 12/17/2014    Procedure: RIGHT BREAST RADIOACTIVE SEED LOCALIZED LUMPECTOMY AND SENTINEL NODE MAPPING;  Surgeon: PAutumn MessingIII, MD;  [place] [facility]  Service: General;  Laterality: Right;  Lumber spine surgery 15 years   SOCIAL HISTORY: History   Social History  .  Marital Status: Single    Spouse Name: N/A    Number of Children: 4, 2 girls, 2 boys, age 33-52   . Years of Education: N/A  She lives alone, her daughter lives close to her   Occupational History   Cleaning     Social History Main Topics  . Smoking status: Never Smoker   . Smokeless tobacco: Current User    Types: Snuff  . Alcohol Use: No  . Drug Use: No  .  Sexual Activity: Not on file     FAMILY HISTORY: Mother had lung cancer at age of 43 Sister had breast ca at age of 19 Brother had lung cancer at 40 No other family history of malignancy.  ALLERGIES:  has No Known Allergies.  MEDICATIONS:  Current Outpatient Prescriptions  Medication Sig Dispense Refill  . aspirin EC 81 MG tablet Take 81 mg by mouth daily.      Marland Kitchen atorvastatin (LIPITOR) 40 MG tablet Take 40 mg by mouth daily.      . Calcium Carbonate-Vitamin D (CALCIUM 600+D) 600-400 MG-UNIT per tablet Take 1 tablet by mouth daily.      . cycloSPORINE (RESTASIS) 0.05 % ophthalmic emulsion Place 1 drop into both eyes 2 (two) times daily.      Marland Kitchen exemestane (AROMASIN) 25 MG tablet Take 1 tablet (25 mg total) by mouth daily after breakfast. 30 tablet 5  . glimepiride (AMARYL) 4 MG tablet Take 4 mg by mouth 2 (two) times daily with a meal.      . lisinopril-hydrochlorothiazide (PRINZIDE,ZESTORETIC) 10-12.5 MG per tablet Take 1 tablet by mouth daily.      . metFORMIN (GLUCOPHAGE) 500 MG tablet Take 1 tablet (500 mg total) by mouth 2 (two) times daily with a meal. 20 tablet 0  . NIFEdipine (PROCARDIA XL/ADALAT-CC) 60 MG 24 hr tablet Take 60 mg by mouth daily.      . pantoprazole (PROTONIX) 40 MG tablet Take 40 mg by mouth daily.    . sertraline (ZOLOFT) 100 MG tablet Take 100 mg by mouth every morning.      . triamcinolone cream (KENALOG) 0.5 %     . vitamin C (ASCORBIC ACID) 500 MG tablet Take 500 mg by mouth daily.      Marland Kitchen zolpidem (AMBIEN) 10 MG tablet Take 5 mg by mouth at bedtime as needed. She takes four nights a week for sleep.      No current facility-administered medications for this visit.    REVIEW OF SYSTEMS:   Constitutional: Denies fevers, chills or abnormal night sweats Eyes: Denies blurriness of vision, double vision or watery eyes Ears, nose, mouth, throat, and face: Denies mucositis or sore throat Respiratory: Denies cough, dyspnea or wheezes Cardiovascular: Denies  palpitation, chest discomfort or lower extremity swelling Gastrointestinal:  Denies nausea, heartburn or change in bowel habits Skin: Denies abnormal skin rashes Lymphatics: Denies new lymphadenopathy or easy bruising Neurological:Denies numbness, tingling or new weaknesses Behavioral/Psych: Mood is stable, no new changes  All other systems were reviewed with the patient and are negative. (+) joints pain in knees and arms   PHYSICAL EXAMINATION: ECOG PERFORMANCE STATUS: 0 - Asymptomatic  Filed Vitals:   02/09/15 1506  BP: 155/73  Pulse: 81  Temp: 98.4 F (36.9 C)  Resp: 18   Filed Weights   02/09/15 1506  Weight: 191 lb 9.6 oz (86.909 kg)    GENERAL:alert, no distress and comfortable SKIN: skin color, texture, turgor are normal, no rashes or significant lesions EYES:  normal, conjunctiva are pink and non-injected, sclera clear OROPHARYNX:no exudate, no erythema and lips, buccal mucosa, and tongue normal  NECK: supple, thyroid normal size, non-tender, without nodularity LYMPH:  no palpable lymphadenopathy in the cervical, axillary or inguinal LUNGS: clear to auscultation and percussion with normal breathing effort HEART: regular rate & rhythm and no murmurs and no lower extremity edema ABDOMEN:abdomen soft, non-tender and normal bowel sounds Musculoskeletal:no cyanosis of digits and no clubbing  PSYCH: alert & oriented x 3 with fluent speech NEURO: no focal motor/sensory deficits Breasts: Breast inspection showed them to be symmetrical with no nipple discharge. The surgical scar in right breast and right axilla are well-healed. There is a 3 x 2 cm round firm mass underneath the breast surgical scar, nontender, not movable. No other mass palpable in the right breast. Palpation of the left breasts and axilla revealed no obvious mass that I could appreciate.   LABORATORY DATA:  CBC Latest Ref Rng 02/09/2015 01/07/2015 12/17/2014  WBC 3.9 - 10.3 10e3/uL 8.1 7.3 -  Hemoglobin 11.6 -  15.9 g/dL 11.2(L) 11.0(L) 12.6  Hematocrit 34.8 - 46.6 % 35.0 34.7(L) 37.0  Platelets 145 - 400 10e3/uL 201 211 -    CMP Latest Ref Rng 02/09/2015 01/07/2015 12/17/2014  Glucose 70 - 140 mg/dl 97 118 157(H)  BUN 7.0 - 26.0 mg/dL 24.8 19.8 26(H)  Creatinine 0.6 - 1.1 mg/dL 0.8 0.8 0.90  Sodium 136 - 145 mEq/L 147(H) 143 143  Potassium 3.5 - 5.1 mEq/L 4.8 4.0 4.0  Chloride 96 - 112 mEq/L - - 108  CO2 22 - 29 mEq/L 27 26 -  Calcium 8.4 - 10.4 mg/dL 9.9 9.7 -  Total Protein 6.4 - 8.3 g/dL 7.4 7.4 -  Total Bilirubin 0.20 - 1.20 mg/dL 0.29 0.32 -  Alkaline Phos 40 - 150 U/L 76 72 -  AST 5 - 34 U/L 14 15 -  ALT 0 - 55 U/L 13 14 -      PATHOLOGY REPORT 1. Breast, partial mastectomy, Right 12/17/2014 - INVASIVE DUCTAL CARCINOMA, GRADE II/III, SPANNING 1.1 CM. - DUCTAL CARCINOMA IN SITU, INTERMEDIATE GRADE. - THE SURGICAL RESECTION MARGINS OF SPECIMEN #1 ARE NEGATIVE FOR CARCINOMA. - SEE ONCOLOGY TABLE BELOW. 2. Lymph node, sentinel, biopsy, right axilla - THERE IS NO EVIDENCE OF CARCINOMA IN 1 OF 1 LYMPH NODE (0/1). 1 of 3 Duplicate copy FINAL for Erica Whitney, Erica Whitney [phone]) Diagnosis(continued) 3. Breast, biopsy, additional right medial margin - INVASIVE DUCTAL CARCINOMA. - DUCTAL CARCINOMA IN SITU. - DUCTAL CARCINOMA IN SITU IS FOCALLY LESS THAN 0.1 CM TO THE MEDIAL MARGIN OF SPECIMEN #3.  Specimen, including laterality and lymph node sampling (sentinel, non-sentinel): Right breast and right axillary sentinel node. Procedure: Seed localized lumpectomy, additional medial margin resection, and sentinel lymph node resection x1. Histologic type: Ductal. Grade: II. Tubule formation: 3 Nuclear pleomorphism: 2 Mitotic: 1 Tumor size (gross measurement): 1.1 cm. Margins: Invasive, distance to closest margin: Greater than 0.2 cm to all margins. In-situ, distance to closest margin: Focally less than 0.1 cm to the medial margin of specimen #3. Lymphovascular invasion: Not  identified. Ductal carcinoma in situ: Present. Grade: Intermediate. Extensive intraductal component: Yes. Lobular neoplasia: Not identified. Tumor focality: Unifocal. Treatment effect: N/A. Extent of tumor: Confined to breast parenchyma. Lymph nodes: Examined: 1 Sentinel 0 Non-sentinel 1 Total Lymph nodes with metastasis: 0. Breast prognostic profile: [id number]. Estrogen receptor: 100%, strong staining intensity. Progesterone receptor: 6%, strong staining intensity. Her 2 neu: No amplification was detected. The ratio was  1.52. Her 2 neu by [facility] will be repeated on the current case and the results reported separately. Ki-67: 18%. Non-neoplastic breast: Healing biopsy site. TNM: pT1c, pN0. (JBK:ds 12/21/14)  Results: HER-2/NEU BY [facility] - NO AMPLIFICATION OF HER-2 DETECTED. RESULT RATIO OF HER2: CEP 17 SIGNALS 1.11 AVERAGE HER2 COPY NUMBER PER CELL 2.10 - RADIOGRAPHIC STUDIES: I have personally reviewed the radiological images as listed and agreed with the findings in the report.  Mr Breast Bilateral W Wo Contrast 11/10/2014    FINDINGS:  Breast composition: b.  Scattered fibroglandular tissue.  Background parenchymal enhancement: Mild to moderate  Right breast: There is considerable post biopsy change related to the recent right breast ultrasound-guided core biopsy. The clip is located 2.3 cm posterior and slightly lateral with respect to the small irregular enhancing mass located within the upper outer quadrant of the right breast (middle 1/3). This measures 7 x 7 x 5 mm in size and is associated with a mixture of plateau and washout enhancement kinetics. There are no additional areas of worrisome enhancement within the right breast.  Left breast: No mass or abnormal enhancement.  Lymph nodes: No abnormal appearing lymph nodes.  Ancillary findings:  None.    IMPRESSION:  The clip is located 2.3 cm posterior and slightly lateral with respect of the small (7 mm) irregular enhancing  mass located within the upper outer quadrant of the right breast (middle 1/3). Post biopsy change within the right breast as discussed above. No additional findings and no evidence for adenopathy.    Mm Digital Diagnostic Unilat R 10/26/2014 IMPRESSION:  5 mm mass at 10 o'clock position right breast is suspicious for malignancy.     US Breast Ltd Uni Right Inc Axilla 10/26/2014   IMPRESSION:  5 mm mass at 10 o'clock position right breast is suspicious for malignancy.   RECOMMENDATION: Ultrasound-guided biopsy is recommended and has been scheduled for November 04, 2014 at 1 o'clock p.m.    ASSESSMENT & PLAN:  Ms Talkington is a 74 year old female with multiple comorbidities, including hypertension, diabetes, arthritis with chronic pain, depression and insomnia, who was recently found to have 5-7 mm right breast mass at 10 o'clock position.   1. 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 -She is status post right lumpectomy and sentinel lymph node biopsy, with negative surgical margins. I reviewed her surgical pathology results with patient and her daughter.   -Per pt, Dr. Pablo Ledger did not recommend adjuvant radiation -I strongly recommended her to consider adjuvant AI for a total of 5 years to reduce her risks of cancer recurrence.  -She is tolerating Aromasin very well, we'll continue.  -I'll obtain baseline bone density scan in the next few weeks. -I encouraged her to do regular exercise. -Calcium and vitamin D supplement for bone health. -She was seen at our survival clinic 4 months ago.  2. Right breast lump at the surgical site -This is likely a seroma from surgery. Very unlikely he would be a breast cancer recurrence, given the size and timing often her surgery. -She is going to follow-up with her surgeon Dr. Marcello Moores in a few months. -If it persists, we may get a ultrasound for further evaluation. -Mammogram in  October 2016.  2. HTN, DM -She will continue her medication and a follow-up with her primary care physician. -She will check her blood pressure at home.  PLAN: -continue aromasin  -schedule Bone density scan -RTC in 3 month  All questions were answered. The patient knows to call the clinic with any problems, questions or concerns. I spent 20 minutes counseling the patient face to face. The total time spent in the appointment was 30 minutes and more than 50% was on counseling.     Truitt Merle, MD 02/09/2015 3:15 PM     lab

## 2015-02-09 NOTE — Telephone Encounter (Signed)
Gave avs & calendar for March/May.

## 2015-02-17 ENCOUNTER — Ambulatory Visit (HOSPITAL_COMMUNITY)
Admission: RE | Admit: 2015-02-17 | Discharge: 2015-02-17 | Disposition: A | Discharge status: Home / Self Care | Payer: 59 | Source: Ambulatory Visit | Attending: Hematology | Admitting: Hematology

## 2015-02-17 DIAGNOSIS — Z780 Asymptomatic menopausal state: Secondary | ICD-10-CM | POA: Insufficient documentation

## 2015-02-17 DIAGNOSIS — M8589 Other specified disorders of bone density and structure, multiple sites: Secondary | ICD-10-CM | POA: Diagnosis not present

## 2015-02-17 DIAGNOSIS — Z13820 Encounter for screening for osteoporosis: Secondary | ICD-10-CM | POA: Diagnosis not present

## 2015-02-17 DIAGNOSIS — C50411 Malignant neoplasm of upper-outer quadrant of right female breast: Secondary | ICD-10-CM

## 2015-02-24 ENCOUNTER — Telehealth: Payer: Self-pay | Admitting: *Deleted

## 2015-02-24 NOTE — Telephone Encounter (Signed)
Spoke with pt today and informed pt re:  Per Dr. Burr Medico, pt's bone density showed osteopenia.  Instructed pt to continue with Calcium as prescribed.  Pt stated she did not take Calcium prior to the test, but she has resumed taking med again.

## 2015-03-31 DIAGNOSIS — E119 Type 2 diabetes mellitus without complications: Secondary | ICD-10-CM | POA: Diagnosis not present

## 2015-03-31 DIAGNOSIS — D493 Neoplasm of unspecified behavior of breast: Secondary | ICD-10-CM | POA: Diagnosis not present

## 2015-03-31 DIAGNOSIS — E784 Other hyperlipidemia: Secondary | ICD-10-CM | POA: Diagnosis not present

## 2015-03-31 DIAGNOSIS — M5136 Other intervertebral disc degeneration, lumbar region: Secondary | ICD-10-CM | POA: Diagnosis not present

## 2015-03-31 DIAGNOSIS — I10 Essential (primary) hypertension: Secondary | ICD-10-CM | POA: Diagnosis not present

## 2015-04-16 DIAGNOSIS — C50411 Malignant neoplasm of upper-outer quadrant of right female breast: Secondary | ICD-10-CM | POA: Diagnosis not present

## 2015-05-05 DIAGNOSIS — E119 Type 2 diabetes mellitus without complications: Secondary | ICD-10-CM | POA: Diagnosis not present

## 2015-05-14 ENCOUNTER — Telehealth: Payer: Self-pay | Admitting: Hematology

## 2015-05-14 ENCOUNTER — Encounter: Payer: Self-pay | Admitting: Hematology

## 2015-05-14 ENCOUNTER — Ambulatory Visit (HOSPITAL_BASED_OUTPATIENT_CLINIC_OR_DEPARTMENT_OTHER): Payer: 59 | Admitting: Hematology

## 2015-05-14 ENCOUNTER — Other Ambulatory Visit (HOSPITAL_BASED_OUTPATIENT_CLINIC_OR_DEPARTMENT_OTHER): Payer: 59

## 2015-05-14 VITALS — BP 144/70 | HR 86 | Temp 98.4°F | Resp 19 | Ht 63.0 in | Wt 191.7 lb

## 2015-05-14 DIAGNOSIS — M899 Disorder of bone, unspecified: Secondary | ICD-10-CM | POA: Diagnosis not present

## 2015-05-14 DIAGNOSIS — C50411 Malignant neoplasm of upper-outer quadrant of right female breast: Secondary | ICD-10-CM

## 2015-05-14 DIAGNOSIS — E119 Type 2 diabetes mellitus without complications: Secondary | ICD-10-CM

## 2015-05-14 DIAGNOSIS — I10 Essential (primary) hypertension: Secondary | ICD-10-CM | POA: Diagnosis not present

## 2015-05-14 LAB — CBC WITH DIFFERENTIAL/PLATELET
BASO%: 0.4 % (ref 0.0–2.0)
Basophils Absolute: 0 10*3/uL (ref 0.0–0.1)
EOS ABS: 0.2 10*3/uL (ref 0.0–0.5)
EOS%: 2.2 % (ref 0.0–7.0)
HEMATOCRIT: 34.2 % — AB (ref 34.8–46.6)
HEMOGLOBIN: 11.2 g/dL — AB (ref 11.6–15.9)
LYMPH%: 27.3 % (ref 14.0–49.7)
MCH: 29.8 pg (ref 25.1–34.0)
MCHC: 32.7 g/dL (ref 31.5–36.0)
MCV: 91 fL (ref 79.5–101.0)
MONO#: 0.8 10*3/uL (ref 0.1–0.9)
MONO%: 10.4 % (ref 0.0–14.0)
NEUT#: 4.4 10*3/uL (ref 1.5–6.5)
NEUT%: 59.7 % (ref 38.4–76.8)
Platelets: 211 10*3/uL (ref 145–400)
RBC: 3.76 10*6/uL (ref 3.70–5.45)
RDW: 14.2 % (ref 11.2–14.5)
WBC: 7.3 10*3/uL (ref 3.9–10.3)
lymph#: 2 10*3/uL (ref 0.9–3.3)

## 2015-05-14 LAB — COMPREHENSIVE METABOLIC PANEL (CC13)
ALT: 16 U/L (ref 0–55)
AST: 18 U/L (ref 5–34)
Albumin: 3.7 g/dL (ref 3.5–5.0)
Alkaline Phosphatase: 77 U/L (ref 40–150)
Anion Gap: 9 mEq/L (ref 3–11)
BUN: 20.1 mg/dL (ref 7.0–26.0)
CALCIUM: 9.7 mg/dL (ref 8.4–10.4)
CO2: 25 mEq/L (ref 22–29)
CREATININE: 1 mg/dL (ref 0.6–1.1)
Chloride: 110 mEq/L — ABNORMAL HIGH (ref 98–109)
EGFR: 65 mL/min/{1.73_m2} — ABNORMAL LOW (ref >90)
GLUCOSE: 108 mg/dL (ref 70–140)
POTASSIUM: 3.9 meq/L (ref 3.5–5.1)
Sodium: 143 mEq/L (ref 136–145)
TOTAL PROTEIN: 7.5 g/dL (ref 6.4–8.3)
Total Bilirubin: 0.33 mg/dL (ref 0.20–1.20)

## 2015-05-14 NOTE — Telephone Encounter (Signed)
Added appt Clem gv pt sched

## 2015-05-14 NOTE — Progress Notes (Signed)
[facility] CONSULT NOTE  Patient Care Team: Edwin Avbuere, MD as PCP - General (Internal Medicine) Paul Toth III, MD as Consulting Physician (General Surgery)  , MD as Consulting Physician (Hematology) Stacy Wentworth, MD as Consulting Physician (Radiation Oncology) Gretchen W Dawson, NP as Nurse Practitioner (Nurse Practitioner)    Breast cancer of upper-outer quadrant of right female breast   10/26/2014 Imaging screening mammogram showed a 4mm mass in right breast at 10 o'clock position.    11/05/2014 Pathology Results Biopsy showed grade 2 IDC, ER 90%+, PR 10%+, HER2-, with grade 1 DCIS    11/06/2014 Initial Diagnosis Breast cancer of upper-outer quadrant of right female breast   12/17/2014 Definitive Surgery Right lumpectomy with SLNB revealed grade 2, IDC spanning 1.1 cm; associated grade 2 DCIS. HER2 repeated and remains negative. Surgical margins clear.    12/17/2014 Pathologic Stage [id number]; Stage IA   01/07/2015 -  Anti-estrogen oral therapy Patient to start Aromasin on 01/14/2015.  Planned duration of therapy is 5 years. (End date: 12/2019).   01/12/2015 Survivorship Patient eligible for Survivorship after having completed all anti-cancer treatments (with exception of anti-estrogen) and currently NED.    CHIEF COMPLAINTS/PURPOSE OF CONSULTATION:  Breast cancer   HISTORY OF PRESENTING ILLNESS:  Erica Whitney 74 y.o. female is here because of recent diagnosed breast cancer.  She had routine mammogram screening on 10/26/2014, which showed 5 mm mass in the right breast 10:00 position. She denies any pain or any new symptoms. 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 Ki-67 15%. She had breast MRI on 11/24 2015, which showed a 7 mm enhancing lesion in the right breast biopsy site.  She lives alone, able to take care of her daily needs independently,  but she is not very physically active overall. She has chronic bilateral knee pain arm pain from arthritis, and she and appetite is moderate. She has insomnia and hot flashes. She denies any other new symptoms. She does not drive, her daughter lives close to her and presents to clinic with her today.   INTERIM HISTORY: She returns for follow up. She takes Aromasin daily and tolerates it well overall. She has occasional nausea, no vomiting. Mild hot flush is stable, her chronic joint pain is unchanged. She otherwise is doing well, appetite is good.   MEDICAL HISTORY:  Past Medical History  Diagnosis Date  . Hypertension   . Diabetes mellitus   . Hypercholesteremia   . Peptic ulcer   . Arthritis   . Insomnia   . Depression   . 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 Abdominal hysterectomy  1995  . Back surgery  2000    lumb lam  . Colonoscopy    . Orif metacarpal fracture  2012    left  . Radioactive seed guided mastectomy with axillary sentinel lymph node biopsy Right 12/17/2014    Procedure: RIGHT BREAST RADIOACTIVE SEED LOCALIZED LUMPECTOMY AND SENTINEL NODE MAPPING;  Surgeon: Paul Toth III, MD;  Location: [facility];  Service: General;  Laterality: Right;  Lumber spine surgery 15 years   SOCIAL HISTORY: History   Social History  . Marital Status: Single    Spouse Name: N/A    Number of Children: 4, 2 girls, 2   boys, age 73-52   . Years of Education: N/A  She lives alone, her daughter lives close to her   Occupational History   Cleaning     Social History Main Topics  . Smoking status: Never Smoker   . Smokeless tobacco: Current User    Types: Snuff  . Alcohol Use: No  . Drug Use: No  . Sexual Activity: Not on file     FAMILY HISTORY: Mother had lung cancer at age of 29 Sister had breast  ca at age of 83 Brother had lung cancer at 47 No other family history of malignancy.  ALLERGIES:  has No Known Allergies.  MEDICATIONS:  Current Outpatient Prescriptions  Medication Sig Dispense Refill  . aspirin EC 81 MG tablet Take 81 mg by mouth daily.      Marland Kitchen atorvastatin (LIPITOR) 40 MG tablet Take 40 mg by mouth daily.      . Calcium Carbonate-Vitamin D (CALCIUM 600+D) 600-400 MG-UNIT per tablet Take 1 tablet by mouth daily.      . cycloSPORINE (RESTASIS) 0.05 % ophthalmic emulsion Place 1 drop into both eyes 2 (two) times daily.      Marland Kitchen exemestane (AROMASIN) 25 MG tablet Take 1 tablet (25 mg total) by mouth daily after breakfast. 30 tablet 5  . glimepiride (AMARYL) 4 MG tablet Take 4 mg by mouth 2 (two) times daily with a meal.      . lisinopril-hydrochlorothiazide (PRINZIDE,ZESTORETIC) 10-12.5 MG per tablet Take 1 tablet by mouth daily.      . metFORMIN (GLUCOPHAGE) 500 MG tablet Take 1 tablet (500 mg total) by mouth 2 (two) times daily with a meal. 20 tablet 0  . NIFEdipine (PROCARDIA XL/ADALAT-CC) 60 MG 24 hr tablet Take 60 mg by mouth daily.      . pantoprazole (PROTONIX) 40 MG tablet Take 40 mg by mouth daily.    . sertraline (ZOLOFT) 100 MG tablet Take 100 mg by mouth every morning.      . triamcinolone cream (KENALOG) 0.5 %     . vitamin C (ASCORBIC ACID) 500 MG tablet Take 500 mg by mouth daily.      Marland Kitchen zolpidem (AMBIEN) 10 MG tablet Take 5 mg by mouth at bedtime as needed. She takes four nights a week for sleep.      No current facility-administered medications for this visit.    REVIEW OF SYSTEMS:   Constitutional: Denies fevers, chills or abnormal night sweats Eyes: Denies blurriness of vision, double vision or watery eyes Ears, nose, mouth, throat, and face: Denies mucositis or sore throat Respiratory: Denies cough, dyspnea or wheezes Cardiovascular: Denies palpitation, chest discomfort or lower extremity swelling Gastrointestinal:  Denies nausea, heartburn or change  in bowel habits Skin: Denies abnormal skin rashes Lymphatics: Denies new lymphadenopathy or easy bruising Neurological:Denies numbness, tingling or new weaknesses Behavioral/Psych: Mood is stable, no new changes  All other systems were reviewed with the patient and are negative. (+) joints pain in knees and arms   PHYSICAL EXAMINATION: ECOG PERFORMANCE STATUS: 1  Filed Vitals:   05/14/15 1327  BP: 144/70  Pulse: 86  Temp: 98.4 F (36.9 C)  Resp: 19   Filed Weights   05/14/15 1327  Weight: 191 lb 11.2 oz (86.955 kg)    GENERAL:alert, no distress and comfortable SKIN: skin color, texture, turgor are normal, no rashes or significant lesions EYES: normal, conjunctiva are pink and non-injected, sclera clear OROPHARYNX:no exudate, no erythema and lips, buccal mucosa, and tongue normal  NECK:  supple, thyroid normal size, non-tender, without nodularity LYMPH:  no palpable lymphadenopathy in the cervical, axillary or inguinal LUNGS: clear to auscultation and percussion with normal breathing effort HEART: regular rate & rhythm and no murmurs and no lower extremity edema ABDOMEN:abdomen soft, non-tender and normal bowel sounds Musculoskeletal:no cyanosis of digits and no clubbing  PSYCH: alert & oriented x 3 with fluent speech NEURO: no focal motor/sensory deficits Breasts: Breast inspection showed them to be symmetrical with no nipple discharge. The surgical scar in right breast and right axilla are well-healed. Palpation of the both breasts and axilla revealed no obvious mass that I could appreciate.   LABORATORY DATA:  CBC Latest Ref Rng 05/14/2015 02/09/2015 01/07/2015  WBC 3.9 - 10.3 10e3/uL 7.3 8.1 7.3  Hemoglobin 11.6 - 15.9 g/dL 11.2(L) 11.2(L) 11.0(L)  Hematocrit 34.8 - 46.6 % 34.2(L) 35.0 34.7(L)  Platelets 145 - 400 10e3/uL 211 201 211    CMP Latest Ref Rng 05/14/2015 02/09/2015 01/07/2015  Glucose 70 - 140 mg/dl 108 97 118  BUN 7.0 - 26.0 mg/dL 20.1 24.8 19.8  Creatinine  0.6 - 1.1 mg/dL 1.0 0.8 0.8  Sodium 136 - 145 mEq/L 143 147(H) 143  Potassium 3.5 - 5.1 mEq/L 3.9 4.8 4.0  Chloride 96 - 112 mEq/L - - -  CO2 22 - 29 mEq/L _0 Calcium 8.4 - 10.4 mg/dL 9.7 9.9 9.7  Total Protein 6.4 - 8.3 g/dL 7.5 7.4 7.4  Total Bilirubin 0.20 - 1.20 mg/dL 0.33 0.29 0.32  Alkaline Phos 40 - 150 U/L 77 76 72  AST 5 - 34 U/L _1 ALT 0 - 55 U/L _2 PATHOLOGY REPORT 1. Breast, partial mastectomy, Right 12/17/2014 - INVASIVE DUCTAL CARCINOMA, GRADE II/III, SPANNING 1.1 CM. - DUCTAL CARCINOMA IN SITU, INTERMEDIATE GRADE. - THE SURGICAL RESECTION MARGINS OF SPECIMEN #1 ARE NEGATIVE FOR CARCINOMA. - SEE ONCOLOGY TABLE BELOW. 2. Lymph node, sentinel, biopsy, right axilla - THERE IS NO EVIDENCE OF CARCINOMA IN 1 OF 1 LYMPH NODE (0/1). 1 of 3 Duplicate copy FINAL for Erica Whitney, Erica Whitney [phone]) Diagnosis(continued) 3. Breast, biopsy, additional right medial margin - INVASIVE DUCTAL CARCINOMA. - DUCTAL CARCINOMA IN SITU. - DUCTAL CARCINOMA IN SITU IS FOCALLY LESS THAN 0.1 CM TO THE MEDIAL MARGIN OF SPECIMEN #3.  Specimen, including laterality and lymph node sampling (sentinel, non-sentinel): Right breast and right axillary sentinel node. Procedure: Seed localized lumpectomy, additional medial margin resection, and sentinel lymph node resection x1. Histologic type: Ductal. Grade: II. Tubule formation: 3 Nuclear pleomorphism: 2 Mitotic: 1 Tumor size (gross measurement): 1.1 cm. Margins: Invasive, distance to closest margin: Greater than 0.2 cm to all margins. In-situ, distance to closest margin: Focally less than 0.1 cm to the medial margin of specimen #3. Lymphovascular invasion: Not identified. Ductal carcinoma in situ: Present. Grade: Intermediate. Extensive intraductal component: Yes. Lobular neoplasia: Not identified. Tumor focality: Unifocal. Treatment effect: N/A. Extent of tumor: Confined to breast parenchyma. Lymph  nodes: Examined: 1 Sentinel 0 Non-sentinel 1 Total Lymph nodes with metastasis: 0. Breast prognostic profile: [phone]. Estrogen receptor: 100%, strong staining intensity. Progesterone receptor: 6%, strong staining intensity. Her 2 neu: No amplification was detected. The ratio was 1.52. Her 2 neu by [facility] will be repeated on the current case and the results reported separately. Ki-67: 18%. Non-neoplastic breast: Healing biopsy site. TNM: pT1c, pN0. (JBK:ds 12/21/14)  Results: HER-2/NEU BY [facility] - NO AMPLIFICATION OF HER-2 DETECTED. RESULT RATIO OF HER2: CEP 17  SIGNALS 1.11 AVERAGE HER2 COPY NUMBER PER CELL 2.10 - RADIOGRAPHIC STUDIES: I have personally reviewed the radiological images as listed and agreed with the findings in the report.  Bone density scan 02/17/2015 Impression: Osteopenia, T score at left femural neck -1.3  ASSESSMENT & PLAN:  Erica Whitney is a 73-year-old female with multiple comorbidities, including hypertension, diabetes, arthritis with chronic pain, depression and insomnia, who was recently found to have 5-7 mm right breast mass at 10 o'clock position.   1. 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 -She is status post right lumpectomy and sentinel lymph node biopsy, with negative surgical margins. I reviewed her surgical pathology results with patient and her daughter.   -Per pt, Dr. Wentworth did not recommend adjuvant radiation -I strongly recommended her to consider adjuvant AI for a total of 5 years to reduce her risks of cancer recurrence.  -She is tolerating Aromasin very well, we'll continue, a total of 5 years -I encouraged her to do regular exercise. -Calcium and vitamin D supplement for bone health.   2. Osteopenia -Her recent bone density scan reviewed osteopenia, T score at left femoral neck -1.3 - I encouraged her to continue calcium 2 tablets daily and vitamin D. - we  discussed that Aromasin may worsen her osteopenia     3. HTN, DM -She will continue her medication and a follow-up with her primary care physician. -She will check her blood pressure at home.  PLAN: -continue aromasin  -RTC in 3 month in 3 month    All questions were answered. The patient knows to call the clinic with any problems, questions or concerns. I spent 20 minutes counseling the patient face to face. The total time spent in the appointment was 30 minutes and more than 50% was on counseling.     , , MD 05/14/2015 2:10 PM   

## 2015-05-20 ENCOUNTER — Other Ambulatory Visit: Payer: Self-pay | Admitting: Hematology

## 2015-05-20 DIAGNOSIS — C50411 Malignant neoplasm of upper-outer quadrant of right female breast: Secondary | ICD-10-CM

## 2015-07-07 DIAGNOSIS — I10 Essential (primary) hypertension: Secondary | ICD-10-CM | POA: Diagnosis not present

## 2015-07-07 DIAGNOSIS — J019 Acute sinusitis, unspecified: Secondary | ICD-10-CM | POA: Diagnosis not present

## 2015-07-07 DIAGNOSIS — E119 Type 2 diabetes mellitus without complications: Secondary | ICD-10-CM | POA: Diagnosis not present

## 2015-07-07 DIAGNOSIS — J209 Acute bronchitis, unspecified: Secondary | ICD-10-CM | POA: Diagnosis not present

## 2015-07-07 DIAGNOSIS — E784 Other hyperlipidemia: Secondary | ICD-10-CM | POA: Diagnosis not present

## 2015-08-06 ENCOUNTER — Encounter: Payer: Self-pay | Admitting: Hematology

## 2015-08-06 ENCOUNTER — Telehealth: Payer: Self-pay | Admitting: Hematology

## 2015-08-06 ENCOUNTER — Other Ambulatory Visit (HOSPITAL_BASED_OUTPATIENT_CLINIC_OR_DEPARTMENT_OTHER): Payer: Medicare Other

## 2015-08-06 ENCOUNTER — Other Ambulatory Visit: Payer: Self-pay | Admitting: *Deleted

## 2015-08-06 ENCOUNTER — Ambulatory Visit (HOSPITAL_BASED_OUTPATIENT_CLINIC_OR_DEPARTMENT_OTHER): Payer: Medicare Other | Admitting: Hematology

## 2015-08-06 VITALS — BP 143/73 | HR 86 | Temp 98.0°F | Resp 18 | Ht 63.0 in | Wt 194.0 lb

## 2015-08-06 DIAGNOSIS — E119 Type 2 diabetes mellitus without complications: Secondary | ICD-10-CM

## 2015-08-06 DIAGNOSIS — C50411 Malignant neoplasm of upper-outer quadrant of right female breast: Secondary | ICD-10-CM

## 2015-08-06 DIAGNOSIS — M899 Disorder of bone, unspecified: Secondary | ICD-10-CM

## 2015-08-06 DIAGNOSIS — I10 Essential (primary) hypertension: Secondary | ICD-10-CM

## 2015-08-06 LAB — CBC WITH DIFFERENTIAL/PLATELET
BASO%: 0.5 % (ref 0.0–2.0)
BASOS ABS: 0 10*3/uL (ref 0.0–0.1)
EOS%: 2.7 % (ref 0.0–7.0)
Eosinophils Absolute: 0.2 10*3/uL (ref 0.0–0.5)
HCT: 33.1 % — ABNORMAL LOW (ref 34.8–46.6)
HEMOGLOBIN: 10.7 g/dL — AB (ref 11.6–15.9)
LYMPH#: 1.8 10*3/uL (ref 0.9–3.3)
LYMPH%: 26.8 % (ref 14.0–49.7)
MCH: 29.5 pg (ref 25.1–34.0)
MCHC: 32.2 g/dL (ref 31.5–36.0)
MCV: 91.5 fL (ref 79.5–101.0)
MONO#: 0.7 10*3/uL (ref 0.1–0.9)
MONO%: 10 % (ref 0.0–14.0)
NEUT#: 4.1 10*3/uL (ref 1.5–6.5)
NEUT%: 60 % (ref 38.4–76.8)
Platelets: 197 10*3/uL (ref 145–400)
RBC: 3.62 10*6/uL — ABNORMAL LOW (ref 3.70–5.45)
RDW: 14.6 % — ABNORMAL HIGH (ref 11.2–14.5)
WBC: 6.8 10*3/uL (ref 3.9–10.3)

## 2015-08-06 LAB — COMPREHENSIVE METABOLIC PANEL (CC13)
ALBUMIN: 3.6 g/dL (ref 3.5–5.0)
ALT: 17 U/L (ref 0–55)
AST: 15 U/L (ref 5–34)
Alkaline Phosphatase: 78 U/L (ref 40–150)
Anion Gap: 8 mEq/L (ref 3–11)
BUN: 29.3 mg/dL — AB (ref 7.0–26.0)
CALCIUM: 9.9 mg/dL (ref 8.4–10.4)
CO2: 23 mEq/L (ref 22–29)
CREATININE: 1 mg/dL (ref 0.6–1.1)
Chloride: 112 mEq/L — ABNORMAL HIGH (ref 98–109)
EGFR: 63 mL/min/{1.73_m2} — ABNORMAL LOW (ref >90)
GLUCOSE: 151 mg/dL — AB (ref 70–140)
POTASSIUM: 4.6 meq/L (ref 3.5–5.1)
SODIUM: 143 meq/L (ref 136–145)
Total Bilirubin: 0.27 mg/dL (ref 0.20–1.20)
Total Protein: 7.2 g/dL (ref 6.4–8.3)

## 2015-08-06 MED ORDER — EXEMESTANE 25 MG PO TABS: 25.0000 mg | ORAL_TABLET | Freq: Every day | ORAL | Status: DC

## 2015-08-06 NOTE — Telephone Encounter (Signed)
per pof to sch pt appt-gave pt copy of avs-sch mamma

## 2015-08-06 NOTE — Progress Notes (Signed)
[place] NOTE  Patient Care Team: Nolene Ebbs, MD as PCP - General (Internal Medicine) Autumn Messing III, MD as Consulting Physician (General Surgery) Truitt Merle, MD as Consulting Physician (Hematology) Thea Silversmith, MD as Consulting Physician (Radiation Oncology) Holley Bouche, NP as Nurse Practitioner (Nurse Practitioner)    Breast cancer of upper-outer quadrant of right female breast   10/26/2014 Imaging screening mammogram showed a 80m mass in right breast at 10 o'clock position.    11/05/2014 Pathology Results Biopsy showed grade 2 IDC, ER 90%+, PR 10%+, HER2-, with grade 1 DCIS    11/06/2014 Initial Diagnosis Breast cancer of upper-outer quadrant of right female breast   12/17/2014 Definitive Surgery Right lumpectomy with SLNB revealed grade 2, IDC spanning 1.1 cm; associated grade 2 DCIS. HER2 repeated and remains negative. Surgical margins clear.    12/17/2014 Pathologic Stage pT1cpN0M0; Stage IA   01/07/2015 -  Anti-estrogen oral therapy Patient to start Aromasin on 01/14/2015.  Planned duration of therapy is 5 years. (End date: 12/2019).   01/12/2015 Survivorship Patient eligible for Survivorship after having completed all anti-cancer treatments (with exception of anti-estrogen) and currently NED.    CHIEF COMPLAINTS/PURPOSE OF CONSULTATION:  Breast cancer   HISTORY OF PRESENTING ILLNESS:  Erica Whitney 74y.o. female is here because of recent diagnosed breast cancer.  She had routine mammogram screening on 10/26/2014, which showed 5 mm mass in the right breast 10:00 position. She denies any pain or any new symptoms. 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 Ki-67 15%. She had breast MRI on 11/24 2015, which showed a 7 mm enhancing lesion in the right breast biopsy site.  She lives alone, able to take care of her daily needs independently,  but she is not very physically active overall. She has chronic bilateral knee pain arm pain from arthritis, and she and appetite is moderate. She has insomnia and hot flashes. She denies any other new symptoms. She does not drive, her daughter lives close to her and presents to clinic with her today.   INTERIM HISTORY: She returns for follow up. She is doing well overall. She is compliant with Aromasin, no significant side effects. She has chronic shoulder pain, which is stable. She reports occasional shooting pain in the right breast, especially after she uses her right arm frequently. No right arm swell no other complaints. She takes calcium and vitamin D daily.    MEDICAL HISTORY:  Past Medical History  Diagnosis Date  . Hypertension   . Diabetes mellitus   . Hypercholesteremia   . Peptic ulcer   . Arthritis   . Insomnia   . Depression   . 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 Abdominal hysterectomy  1995  . Back surgery  2000    lumb lam  . Colonoscopy    . Orif metacarpal fracture  2012    left  . Radioactive seed guided mastectomy with axillary sentinel lymph node biopsy Right 12/17/2014    Procedure: RIGHT BREAST RADIOACTIVE SEED LOCALIZED LUMPECTOMY AND SENTINEL NODE MAPPING;  Surgeon: PAutumn MessingIII, MD;  Location: [facility]  Service: General;  Laterality: Right;  Lumber spine surgery 15 years   SOCIAL HISTORY: History   Social History  .  Marital Status: Single    Spouse Name: N/A    Number of Children: 4, 2 girls, 2 boys, age 5-52   . Years of Education: N/A  She lives alone, her daughter lives close to her   Occupational History   Cleaning     Social History Main Topics  . Smoking status: Never Smoker   . Smokeless tobacco: Current User    Types: Snuff  . Alcohol Use: No  . Drug  Use: No  . Sexual Activity: Not on file     FAMILY HISTORY: Mother had lung cancer at age of 41 Sister had breast ca at age of 47 Brother had lung cancer at 18 No other family history of malignancy.  ALLERGIES:  has No Known Allergies.  MEDICATIONS:  Current Outpatient Prescriptions  Medication Sig Dispense Refill  . exemestane (AROMASIN) 25 MG tablet Take 1 tablet (25 mg total) by mouth daily after breakfast. 30 tablet 5  . aspirin EC 81 MG tablet Take 81 mg by mouth daily.      Marland Kitchen atorvastatin (LIPITOR) 40 MG tablet Take 40 mg by mouth daily.      . Calcium Carbonate-Vitamin D (CALCIUM 600+D) 600-400 MG-UNIT per tablet Take 1 tablet by mouth daily.      . cycloSPORINE (RESTASIS) 0.05 % ophthalmic emulsion Place 1 drop into both eyes 2 (two) times daily.      Marland Kitchen dextromethorphan-guaiFENesin (MUCINEX DM) 30-600 MG per 12 hr tablet Take 1 tablet by mouth 2 (two) times daily.    Marland Kitchen glimepiride (AMARYL) 4 MG tablet Take 4 mg by mouth 2 (two) times daily with a meal.      . lisinopril-hydrochlorothiazide (PRINZIDE,ZESTORETIC) 10-12.5 MG per tablet Take 1 tablet by mouth daily.      . metFORMIN (GLUCOPHAGE) 500 MG tablet Take 1 tablet (500 mg total) by mouth 2 (two) times daily with a meal. 20 tablet 0  . NIFEdipine (PROCARDIA XL/ADALAT-CC) 60 MG 24 hr tablet Take 60 mg by mouth daily.      . pantoprazole (PROTONIX) 40 MG tablet Take 40 mg by mouth daily.    . sertraline (ZOLOFT) 100 MG tablet Take 100 mg by mouth every morning.      . triamcinolone cream (KENALOG) 0.5 %     . vitamin C (ASCORBIC ACID) 500 MG tablet Take 500 mg by mouth daily.      Marland Kitchen zolpidem (AMBIEN) 10 MG tablet Take 5 mg by mouth at bedtime as needed. She takes four nights a week for sleep.      No current facility-administered medications for this visit.    REVIEW OF SYSTEMS:   Constitutional: Denies fevers, chills or abnormal night sweats Eyes: Denies blurriness of vision, double vision or watery eyes Ears, nose,  mouth, throat, and face: Denies mucositis or sore throat Respiratory: Denies cough, dyspnea or wheezes Cardiovascular: Denies palpitation, chest discomfort or lower extremity swelling Gastrointestinal:  Denies nausea, heartburn or change in bowel habits Skin: Denies abnormal skin rashes Lymphatics: Denies new lymphadenopathy or easy bruising Neurological:Denies numbness, tingling or new weaknesses Behavioral/Psych: Mood is stable, no new changes  All other systems were reviewed with the patient and are negative. (+) joints pain in knees and arms   PHYSICAL EXAMINATION: ECOG PERFORMANCE STATUS: 1  Filed Vitals:   08/06/15 1330  BP: 143/73  Pulse: 86  Temp: 98 F (36.7 C)  Resp: 18   Filed Weights   08/06/15 1330  Weight: 194 lb (87.998 kg)  GENERAL:alert, no distress and comfortable SKIN: skin color, texture, turgor are normal, no rashes or significant lesions EYES: normal, conjunctiva are pink and non-injected, sclera clear OROPHARYNX:no exudate, no erythema and lips, buccal mucosa, and tongue normal  NECK: supple, thyroid normal size, non-tender, without nodularity LYMPH:  no palpable lymphadenopathy in the cervical, axillary or inguinal LUNGS: clear to auscultation and percussion with normal breathing effort HEART: regular rate & rhythm and no murmurs and no lower extremity edema ABDOMEN:abdomen soft, non-tender and normal bowel sounds Musculoskeletal:no cyanosis of digits and no clubbing  PSYCH: alert & oriented x 3 with fluent speech NEURO: no focal motor/sensory deficits Breasts: Breast inspection showed them to be symmetrical with no nipple discharge. The surgical scar in right breast and right axilla are well-healed. Palpation of the both breasts and axilla revealed no obvious mass that I could appreciate.   LABORATORY DATA:  CBC Latest Ref Rng 08/06/2015 05/14/2015 02/09/2015  WBC 3.9 - 10.3 10e3/uL 6.8 7.3 8.1  Hemoglobin 11.6 - 15.9 g/dL 10.7(L) 11.2(L) 11.2(L)   Hematocrit 34.8 - 46.6 % 33.1(L) 34.2(L) 35.0  Platelets 145 - 400 10e3/uL 197 211 201    CMP Latest Ref Rng 08/06/2015 05/14/2015 02/09/2015  Glucose 70 - 140 mg/dl 151(H) 108 97  BUN 7.0 - 26.0 mg/dL 29.3(H) 20.1 24.8  Creatinine 0.6 - 1.1 mg/dL 1.0 1.0 0.8  Sodium 136 - 145 mEq/L 143 143 147(H)  Potassium 3.5 - 5.1 mEq/L 4.6 3.9 4.8  Chloride 96 - 112 mEq/L - - -  CO2 22 - 29 mEq/L 23 25 27   Calcium 8.4 - 10.4 mg/dL 9.9 9.7 9.9  Total Protein 6.4 - 8.3 g/dL 7.2 7.5 7.4  Total Bilirubin 0.20 - 1.20 mg/dL 0.27 0.33 0.29  Alkaline Phos 40 - 150 U/L 78 77 76  AST 5 - 34 U/L 15 18 14   ALT 0 - 55 U/L 17 16 13       PATHOLOGY REPORT 1. Breast, partial mastectomy, Right 12/17/2014 - INVASIVE DUCTAL CARCINOMA, GRADE II/III, SPANNING 1.1 CM. - DUCTAL CARCINOMA IN SITU, INTERMEDIATE GRADE. - THE SURGICAL RESECTION MARGINS OF SPECIMEN #1 ARE NEGATIVE FOR CARCINOMA. - SEE ONCOLOGY TABLE BELOW. 2. Lymph node, sentinel, biopsy, right axilla - THERE IS NO EVIDENCE OF CARCINOMA IN 1 OF 1 LYMPH NODE (0/1). 1 of 3 Duplicate copy FINAL for Laughter, JAYLON GRODE [phone]) Diagnosis(continued) 3. Breast, biopsy, additional right medial margin - INVASIVE DUCTAL CARCINOMA. - DUCTAL CARCINOMA IN SITU. - DUCTAL CARCINOMA IN SITU IS FOCALLY LESS THAN 0.1 CM TO THE MEDIAL MARGIN OF SPECIMEN #3.  Specimen, including laterality and lymph node sampling (sentinel, non-sentinel): Right breast and right axillary sentinel node. Procedure: Seed localized lumpectomy, additional medial margin resection, and sentinel lymph node resection x1. Histologic type: Ductal. Grade: II. Tubule formation: 3 Nuclear pleomorphism: 2 Mitotic: 1 Tumor size (gross measurement): 1.1 cm. Margins: Invasive, distance to closest margin: Greater than 0.2 cm to all margins. In-situ, distance to closest margin: Focally less than 0.1 cm to the medial margin of specimen #3. Lymphovascular invasion: Not identified. Ductal  carcinoma in situ: Present. Grade: Intermediate. Extensive intraductal component: Yes. Lobular neoplasia: Not identified. Tumor focality: Unifocal. Treatment effect: N/A. Extent of tumor: Confined to breast parenchyma. Lymph nodes: Examined: 1 Sentinel 0 Non-sentinel 1 Total Lymph nodes with metastasis: 0. Breast prognostic profile: [phone]. Estrogen receptor: 100%, strong staining intensity. Progesterone receptor: 6%, strong staining intensity. Her 2 neu: No amplification was detected. The ratio was 1.52. Her 2 neu by [facility] will be  repeated on the current case and the results reported separately. Ki-67: 18%. Non-neoplastic breast: Healing biopsy site. TNM: pT1c, pN0. (JBK:ds 12/21/14)  Results: HER-2/NEU BY [facility] - NO AMPLIFICATION OF HER-2 DETECTED. RESULT RATIO OF HER2: CEP 17 SIGNALS 1.11 AVERAGE HER2 COPY NUMBER PER CELL 2.10 - RADIOGRAPHIC STUDIES: I have personally reviewed the radiological images as listed and agreed with the findings in the report.  Bone density scan 02/17/2015 Impression: Osteopenia, T score at left femural neck -1.3  ASSESSMENT & PLAN:  Ms Dewitt is a 74 year old female with multiple comorbidities, including hypertension, diabetes, arthritis with chronic pain, depression and insomnia, who was recently found to have 5-7 mm right breast mass at 10 o'clock position.   1. 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 -She is status post right lumpectomy and sentinel lymph node biopsy, with negative surgical margins. I reviewed her surgical pathology results with patient and her daughter.   -Per pt, Dr. Pablo Ledger did not recommend adjuvant radiation -I strongly recommended her to consider adjuvant AI for a total of 5 years to reduce her risks of cancer recurrence.  -She is tolerating Aromasin very well, we'll continue, a total of 5 years -I encouraged her to do regular  exercise. -Calcium and vitamin D supplement for bone health. -She is due for mammogram in 3 months, I'll schedule one today   2. Osteopenia -Her recent bone density scan reviewed osteopenia, T score at left femoral neck -1.3 - I encouraged her to continue calcium 2 tablets daily and vitamin D. - we discussed that Aromasin may worsen her osteopenia     3. HTN, DM, arthritis -She will continue her medication and a follow-up with her primary care physician. -She will check her blood pressure at home.  4. Mild normocytic anemia -She has been mildly anemic since last seen. Hemoglobin in the range of 11-12, 10.7 today -I'll obtain iron studies, ferritin, folic acid and Y81 level on her next lab draw -No need blood transfusion.  PLAN: -continue Aromasin, I refilled it today  -RTC in 3 month in 3 month, with a repeated mammogram   All questions were answered. The patient knows to call the clinic with any problems, questions or concerns. I spent 20 minutes counseling the patient face to face. The total time spent in the appointment was 30 minutes and more than 50% was on counseling.     Truitt Merle, MD 08/06/2015 2:10 PM

## 2015-08-11 DIAGNOSIS — E119 Type 2 diabetes mellitus without complications: Secondary | ICD-10-CM | POA: Diagnosis not present

## 2015-08-11 DIAGNOSIS — I10 Essential (primary) hypertension: Secondary | ICD-10-CM | POA: Diagnosis not present

## 2015-08-11 DIAGNOSIS — M5136 Other intervertebral disc degeneration, lumbar region: Secondary | ICD-10-CM | POA: Diagnosis not present

## 2015-08-11 DIAGNOSIS — J302 Other seasonal allergic rhinitis: Secondary | ICD-10-CM | POA: Diagnosis not present

## 2015-08-11 DIAGNOSIS — K219 Gastro-esophageal reflux disease without esophagitis: Secondary | ICD-10-CM | POA: Diagnosis not present

## 2015-09-16 DIAGNOSIS — H04123 Dry eye syndrome of bilateral lacrimal glands: Secondary | ICD-10-CM | POA: Diagnosis not present

## 2015-09-16 DIAGNOSIS — H40013 Open angle with borderline findings, low risk, bilateral: Secondary | ICD-10-CM | POA: Diagnosis not present

## 2015-09-16 DIAGNOSIS — E119 Type 2 diabetes mellitus without complications: Secondary | ICD-10-CM | POA: Diagnosis not present

## 2015-09-16 DIAGNOSIS — H11153 Pinguecula, bilateral: Secondary | ICD-10-CM | POA: Diagnosis not present

## 2015-09-23 DIAGNOSIS — C50411 Malignant neoplasm of upper-outer quadrant of right female breast: Secondary | ICD-10-CM | POA: Diagnosis not present

## 2015-10-11 DIAGNOSIS — I10 Essential (primary) hypertension: Secondary | ICD-10-CM | POA: Diagnosis not present

## 2015-10-11 DIAGNOSIS — Z23 Encounter for immunization: Secondary | ICD-10-CM | POA: Diagnosis not present

## 2015-10-11 DIAGNOSIS — E119 Type 2 diabetes mellitus without complications: Secondary | ICD-10-CM | POA: Diagnosis not present

## 2015-10-11 DIAGNOSIS — K219 Gastro-esophageal reflux disease without esophagitis: Secondary | ICD-10-CM | POA: Diagnosis not present

## 2015-10-18 ENCOUNTER — Encounter: Payer: Self-pay | Admitting: Adult Health

## 2015-10-18 NOTE — Progress Notes (Signed)
A birthday card was mailed to the patient today on behalf of the Survivorship Program at [facility].   Sylvia Helms, NP Survivorship Program [facility] [phone]  

## 2015-10-19 ENCOUNTER — Telehealth: Payer: Self-pay | Admitting: Hematology

## 2015-10-19 NOTE — Telephone Encounter (Signed)
cld & spoke to pt and gave pt r/s time & date for appt 11/21

## 2015-10-26 DIAGNOSIS — E119 Type 2 diabetes mellitus without complications: Secondary | ICD-10-CM | POA: Diagnosis not present

## 2015-11-01 ENCOUNTER — Other Ambulatory Visit: Payer: Self-pay | Admitting: Oncology

## 2015-11-05 ENCOUNTER — Ambulatory Visit
Admission: RE | Admit: 2015-11-05 | Discharge: 2015-11-05 | Disposition: A | Discharge status: Home / Self Care | Payer: Medicare Other | Source: Ambulatory Visit | Attending: Hematology | Admitting: Hematology

## 2015-11-05 ENCOUNTER — Other Ambulatory Visit: Payer: Self-pay | Admitting: Hematology

## 2015-11-05 DIAGNOSIS — C50411 Malignant neoplasm of upper-outer quadrant of right female breast: Secondary | ICD-10-CM

## 2015-11-05 DIAGNOSIS — R928 Other abnormal and inconclusive findings on diagnostic imaging of breast: Secondary | ICD-10-CM | POA: Diagnosis not present

## 2015-11-08 ENCOUNTER — Other Ambulatory Visit (HOSPITAL_BASED_OUTPATIENT_CLINIC_OR_DEPARTMENT_OTHER): Payer: Medicare Other

## 2015-11-08 ENCOUNTER — Telehealth: Payer: Self-pay | Admitting: Hematology

## 2015-11-08 ENCOUNTER — Ambulatory Visit: Payer: Self-pay | Admitting: Hematology

## 2015-11-08 ENCOUNTER — Ambulatory Visit (HOSPITAL_BASED_OUTPATIENT_CLINIC_OR_DEPARTMENT_OTHER): Payer: Medicare Other | Admitting: Oncology

## 2015-11-08 ENCOUNTER — Other Ambulatory Visit: Payer: Self-pay

## 2015-11-08 ENCOUNTER — Encounter: Payer: Self-pay | Admitting: Oncology

## 2015-11-08 VITALS — BP 142/72 | HR 80 | Temp 97.7°F | Resp 20 | Ht 63.0 in | Wt 191.0 lb

## 2015-11-08 DIAGNOSIS — C50411 Malignant neoplasm of upper-outer quadrant of right female breast: Secondary | ICD-10-CM | POA: Diagnosis not present

## 2015-11-08 DIAGNOSIS — D649 Anemia, unspecified: Secondary | ICD-10-CM | POA: Diagnosis not present

## 2015-11-08 DIAGNOSIS — C50419 Malignant neoplasm of upper-outer quadrant of unspecified female breast: Secondary | ICD-10-CM | POA: Diagnosis not present

## 2015-11-08 LAB — CBC & DIFF AND RETIC
BASO%: 0.1 % (ref 0.0–2.0)
BASOS ABS: 0 10*3/uL (ref 0.0–0.1)
EOS%: 2 % (ref 0.0–7.0)
Eosinophils Absolute: 0.2 10*3/uL (ref 0.0–0.5)
HEMATOCRIT: 33.4 % — AB (ref 34.8–46.6)
HGB: 10.7 g/dL — ABNORMAL LOW (ref 11.6–15.9)
Immature Retic Fract: 9.2 % (ref 1.60–10.00)
LYMPH%: 31.8 % (ref 14.0–49.7)
MCH: 29.7 pg (ref 25.1–34.0)
MCHC: 32 g/dL (ref 31.5–36.0)
MCV: 92.8 fL (ref 79.5–101.0)
MONO#: 0.7 10*3/uL (ref 0.1–0.9)
MONO%: 8.7 % (ref 0.0–14.0)
NEUT#: 4.3 10*3/uL (ref 1.5–6.5)
NEUT%: 57.4 % (ref 38.4–76.8)
Platelets: 198 10*3/uL (ref 145–400)
RBC: 3.6 10*6/uL — ABNORMAL LOW (ref 3.70–5.45)
RDW: 13.8 % (ref 11.2–14.5)
RETIC %: 1.24 % (ref 0.70–2.10)
Retic Ct Abs: 44.64 10*3/uL (ref 33.70–90.70)
WBC: 7.4 10*3/uL (ref 3.9–10.3)
lymph#: 2.4 10*3/uL (ref 0.9–3.3)

## 2015-11-08 LAB — COMPREHENSIVE METABOLIC PANEL (CC13)
ALBUMIN: 3.7 g/dL (ref 3.5–5.0)
ALT: 16 U/L (ref 0–55)
AST: 18 U/L (ref 5–34)
Alkaline Phosphatase: 83 U/L (ref 40–150)
Anion Gap: 10 mEq/L (ref 3–11)
BILIRUBIN TOTAL: 0.42 mg/dL (ref 0.20–1.20)
BUN: 29.8 mg/dL — ABNORMAL HIGH (ref 7.0–26.0)
CHLORIDE: 109 meq/L (ref 98–109)
CO2: 25 mEq/L (ref 22–29)
CREATININE: 1.1 mg/dL (ref 0.6–1.1)
Calcium: 9.8 mg/dL (ref 8.4–10.4)
EGFR: 61 mL/min/{1.73_m2} — AB (ref >90)
Glucose: 100 mg/dl (ref 70–140)
Potassium: 4 mEq/L (ref 3.5–5.1)
SODIUM: 144 meq/L (ref 136–145)
TOTAL PROTEIN: 7.5 g/dL (ref 6.4–8.3)

## 2015-11-08 LAB — IRON AND TIBC CHCC
%SAT: 24 % (ref 21–57)
IRON: 79 ug/dL (ref 41–142)
TIBC: 321 ug/dL (ref 236–444)
UIBC: 243 ug/dL (ref 120–384)

## 2015-11-08 LAB — FERRITIN CHCC: FERRITIN: 21 ng/mL (ref 9–269)

## 2015-11-08 NOTE — Telephone Encounter (Signed)
Gave and printd aptp sched anda vs fo rpt for Feb 2017

## 2015-11-08 NOTE — Progress Notes (Signed)
[place] NOTE  Patient Care Team: Nolene Ebbs, MD as PCP - General (Internal Medicine) Autumn Messing III, MD as Consulting Physician (General Surgery) Truitt Merle, MD as Consulting Physician (Hematology) Thea Silversmith, MD as Consulting Physician (Radiation Oncology) Holley Bouche, NP as Nurse Practitioner (Nurse Practitioner)    Breast cancer of upper-outer quadrant of right female breast [facility] [place])   10/26/2014 Imaging screening mammogram showed a 12m mass in right breast at 10 o'clock position.    11/05/2014 Pathology Results Biopsy showed grade 2 IDC, ER 90%+, PR 10%+, HER2-, with grade 1 DCIS    11/06/2014 Initial Diagnosis Breast cancer of upper-outer quadrant of right female breast   12/17/2014 Definitive Surgery Right lumpectomy with SLNB revealed grade 2, IDC spanning 1.1 cm; associated grade 2 DCIS. HER2 repeated and remains negative. Surgical margins clear.    12/17/2014 Pathologic Stage pT1cpN0M0; Stage IA   01/07/2015 -  Anti-estrogen oral therapy Patient to start Aromasin on 01/14/2015.  Planned duration of therapy is 5 years. (End date: 12/2019).   01/12/2015 Survivorship Patient eligible for Survivorship after having completed all anti-cancer treatments (with exception of anti-estrogen) and currently NED.    CHIEF COMPLAINTS/PURPOSE OF CONSULTATION:  Breast cancer   HISTORY OF PRESENTING ILLNESS:  Erica Whitney 74y.o. female is here because of breast cancer.  She had routine mammogram screening on 10/26/2014, which showed 5 mm mass in the right breast 10:00 position. She denies any pain or any new symptoms. 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 Ki-67 15%. She had breast MRI on 11/24 2015, which showed a 7 mm enhancing lesion in the right breast biopsy site.  She lives alone, able to take care of her daily needs independently, but she is  not very physically active overall. She has chronic bilateral knee pain arm pain from arthritis, and she and appetite is moderate. She has insomnia and hot flashes. She denies any other new symptoms. She does not drive, her daughter lives close to her and presents to clinic with her today.   INTERIM HISTORY: She returns for follow up. She is doing well overall. She is compliant with Aromasin, no significant side effects. She has chronic shoulder pain, which is stable. No right arm swell no other complaints. She takes calcium and vitamin D daily.    MEDICAL HISTORY:  Past Medical History  Diagnosis Date  . Hypertension   . Diabetes mellitus   . Hypercholesteremia   . Peptic ulcer   . Arthritis   . Insomnia   . Depression   . 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 Abdominal hysterectomy  1995  . Back surgery  2000    lumb lam  . Colonoscopy    . Orif metacarpal fracture  2012    left  . Radioactive seed guided mastectomy with axillary sentinel lymph node biopsy Right 12/17/2014    Procedure: RIGHT BREAST RADIOACTIVE SEED LOCALIZED LUMPECTOMY AND SENTINEL NODE MAPPING;  Surgeon: PAutumn MessingIII, MD;  [place]  Service: General;  Laterality: Right;  Lumber spine surgery 15 years   SOCIAL HISTORY: History   Social History  . Marital Status: Single    Spouse Name: N/A    Number of Children: 4, 2  girls, 2 boys, age 22-52   . Years of Education: N/A  She lives alone, her daughter lives close to her   Occupational History   Cleaning     Social History Main Topics  . Smoking status: Never Smoker   . Smokeless tobacco: Current User    Types: Snuff  . Alcohol Use: No  . Drug Use: No  . Sexual Activity: Not on file     FAMILY HISTORY: Mother had lung cancer at age of 20 Sister had breast  ca at age of 61 Brother had lung cancer at 24 No other family history of malignancy.  ALLERGIES:  has No Known Allergies.  MEDICATIONS:  Current Outpatient Prescriptions  Medication Sig Dispense Refill  . aspirin EC 81 MG tablet Take 81 mg by mouth daily.      Marland Kitchen atorvastatin (LIPITOR) 40 MG tablet Take 40 mg by mouth daily.      . Calcium Carbonate-Vitamin D (CALCIUM 600+D) 600-400 MG-UNIT per tablet Take 1 tablet by mouth daily.      . cycloSPORINE (RESTASIS) 0.05 % ophthalmic emulsion Place 1 drop into both eyes 2 (two) times daily.      Marland Kitchen dextromethorphan-guaiFENesin (MUCINEX DM) 30-600 MG per 12 hr tablet Take 1 tablet by mouth 2 (two) times daily.    Marland Kitchen exemestane (AROMASIN) 25 MG tablet Take 1 tablet (25 mg total) by mouth daily after breakfast. 30 tablet 5  . glimepiride (AMARYL) 4 MG tablet Take 4 mg by mouth 2 (two) times daily with a meal.      . lisinopril-hydrochlorothiazide (PRINZIDE,ZESTORETIC) 10-12.5 MG per tablet Take 1 tablet by mouth daily.      . metFORMIN (GLUCOPHAGE) 1000 MG tablet As directed    . NIFEdipine (PROCARDIA XL/ADALAT-CC) 60 MG 24 hr tablet Take 60 mg by mouth daily.      . pantoprazole (PROTONIX) 40 MG tablet Take 40 mg by mouth daily.    . sertraline (ZOLOFT) 100 MG tablet Take 100 mg by mouth every morning.      . triamcinolone cream (KENALOG) 0.5 %     . vitamin C (ASCORBIC ACID) 500 MG tablet Take 500 mg by mouth daily.      Marland Kitchen zolpidem (AMBIEN) 10 MG tablet Take 5 mg by mouth at bedtime as needed. She takes four nights a week for sleep.      No current facility-administered medications for this visit.    REVIEW OF SYSTEMS:   Constitutional: Denies fevers, chills or abnormal night sweats Eyes: Denies blurriness of vision, double vision or watery eyes Ears, nose, mouth, throat, and face: Denies mucositis or sore throat Respiratory: Denies cough, dyspnea or wheezes Cardiovascular: Denies palpitation, chest discomfort or lower extremity  swelling Gastrointestinal:  Denies nausea, heartburn or change in bowel habits Skin: Denies abnormal skin rashes Lymphatics: Denies new lymphadenopathy or easy bruising Neurological:Denies numbness, tingling or new weaknesses Behavioral/Psych: Mood is stable, no new changes  All other systems were reviewed with the patient and are negative. (+) joints pain in knees and arms   PHYSICAL EXAMINATION: ECOG PERFORMANCE STATUS: 1  Filed Vitals:   11/08/15 1326  BP: 142/72  Pulse: 80  Temp: 97.7 F (36.5 C)  Resp: 20   Filed Weights   11/08/15 1326  Weight: 191 lb (86.637 kg)    GENERAL:alert, no distress and comfortable SKIN: skin color, texture, turgor are normal, no rashes or significant lesions EYES: normal, conjunctiva are pink and non-injected, sclera clear OROPHARYNX:no exudate, no erythema and  lips, buccal mucosa, and tongue normal  NECK: supple, thyroid normal size, non-tender, without nodularity LYMPH:  no palpable lymphadenopathy in the cervical, axillary or inguinal LUNGS: clear to auscultation and percussion with normal breathing effort HEART: regular rate & rhythm and no murmurs and no lower extremity edema ABDOMEN:abdomen soft, non-tender and normal bowel sounds Musculoskeletal:no cyanosis of digits and no clubbing  PSYCH: alert & oriented x 3 with fluent speech NEURO: no focal motor/sensory deficits Breasts: Breast inspection showed them to be symmetrical with no nipple discharge. The surgical scar in right breast and right axilla are well-healed. Palpation of the both breasts and axilla revealed no obvious mass that I could appreciate.   LABORATORY DATA:  CBC Latest Ref Rng 11/08/2015 08/06/2015 05/14/2015  WBC 3.9 - 10.3 10e3/uL 7.4 6.8 7.3  Hemoglobin 11.6 - 15.9 g/dL 10.7(L) 10.7(L) 11.2(L)  Hematocrit 34.8 - 46.6 % 33.4(L) 33.1(L) 34.2(L)  Platelets 145 - 400 10e3/uL 198 197 211    CMP Latest Ref Rng 11/08/2015 08/06/2015 05/14/2015  Glucose 70 - 140 mg/dl  100 151(H) 108  BUN 7.0 - 26.0 mg/dL 29.8(H) 29.3(H) 20.1  Creatinine 0.6 - 1.1 mg/dL 1.1 1.0 1.0  Sodium 136 - 145 mEq/L 144 143 143  Potassium 3.5 - 5.1 mEq/L 4.0 4.6 3.9  Chloride 96 - 112 mEq/L - - -  CO2 22 - 29 mEq/L 25 23 25   Calcium 8.4 - 10.4 mg/dL 9.8 9.9 9.7  Total Protein 6.4 - 8.3 g/dL 7.5 7.2 7.5  Total Bilirubin 0.20 - 1.20 mg/dL 0.42 0.27 0.33  Alkaline Phos 40 - 150 U/L 83 78 77  AST 5 - 34 U/L 18 15 18   ALT 0 - 55 U/L 16 17 16       PATHOLOGY REPORT 1. Breast, partial mastectomy, Right 12/17/2014 - INVASIVE DUCTAL CARCINOMA, GRADE II/III, SPANNING 1.1 CM. - DUCTAL CARCINOMA IN SITU, INTERMEDIATE GRADE. - THE SURGICAL RESECTION MARGINS OF SPECIMEN #1 ARE NEGATIVE FOR CARCINOMA. - SEE ONCOLOGY TABLE BELOW. 2. Lymph node, sentinel, biopsy, right axilla - THERE IS NO EVIDENCE OF CARCINOMA IN 1 OF 1 LYMPH NODE (0/1). 1 of 3 Duplicate copy FINAL for Erica Whitney, Erica Whitney [phone]) Diagnosis(continued) 3. Breast, biopsy, additional right medial margin - INVASIVE DUCTAL CARCINOMA. - DUCTAL CARCINOMA IN SITU. - DUCTAL CARCINOMA IN SITU IS FOCALLY LESS THAN 0.1 CM TO THE MEDIAL MARGIN OF SPECIMEN #3.  Specimen, including laterality and lymph node sampling (sentinel, non-sentinel): Right breast and right axillary sentinel node. Procedure: Seed localized lumpectomy, additional medial margin resection, and sentinel lymph node resection x1. Histologic type: Ductal. Grade: II. Tubule formation: 3 Nuclear pleomorphism: 2 Mitotic: 1 Tumor size (gross measurement): 1.1 cm. Margins: Invasive, distance to closest margin: Greater than 0.2 cm to all margins. In-situ, distance to closest margin: Focally less than 0.1 cm to the medial margin of specimen #3. Lymphovascular invasion: Not identified. Ductal carcinoma in situ: Present. Grade: Intermediate. Extensive intraductal component: Yes. Lobular neoplasia: Not identified. Tumor focality: Unifocal. Treatment effect:  N/A. Extent of tumor: Confined to breast parenchyma. Lymph nodes: Examined: 1 Sentinel 0 Non-sentinel 1 Total Lymph nodes with metastasis: 0. Breast prognostic profile: [phone]. Estrogen receptor: 100%, strong staining intensity. Progesterone receptor: 6%, strong staining intensity. Her 2 neu: No amplification was detected. The ratio was 1.52. Her 2 neu by [facility] will be repeated on the current case and the results reported separately. Ki-67: 18%. Non-neoplastic breast: Healing biopsy site. TNM: pT1c, pN0. (JBK:ds 12/21/14)  Results: HER-2/NEU BY [facility] - NO AMPLIFICATION OF  HER-2 DETECTED. RESULT RATIO OF HER2: CEP 17 SIGNALS 1.11 AVERAGE HER2 COPY NUMBER PER CELL 2.10 - RADIOGRAPHIC STUDIES: I have personally reviewed the radiological images as listed and agreed with the findings in the report.  Bone density scan 02/17/2015 Impression: Osteopenia, T score at left femural neck -1.3  ASSESSMENT & PLAN:  Erica Whitney is a 74 year old female with multiple comorbidities, including hypertension, diabetes, arthritis with chronic pain, depression and insomnia, who was recently found to have 5-7 mm right breast mass at 10 o'clock position.   1. 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 -She is status post right lumpectomy and sentinel lymph node biopsy, with negative surgical margins. I reviewed her surgical pathology results with patient and her daughter.   -Per pt, Dr. Pablo Ledger did not recommend adjuvant radiation -I strongly recommended her to consider adjuvant AI for a total of 5 years to reduce her risks of cancer recurrence.  -She is tolerating Aromasin very well, we'll continue, a total of 5 years -I encouraged her to do regular exercise. -Calcium and vitamin D supplement for bone health. -Mammogram was normal in 10/2015.   2. Osteopenia -Her recent bone density scan reviewed osteopenia, T score at left  femoral neck -1.3 - I encouraged her to continue calcium 2 tablets daily and vitamin D. - we discussed that Aromasin may worsen her osteopenia     3. HTN, DM, arthritis -She will continue her medication and a follow-up with her primary care physician. -She will check her blood pressure at home.  4. Mild normocytic anemia -She remains mildly anemic. Hemoglobin in the range of 11-12, 10.7 today -Iron studies, ferritin, folic acid and U27 level are pending today -No need blood transfusion.  PLAN: -continue Aromasin -RTC in 3 month in 3 month   All questions were answered. The patient knows to call the clinic with any problems, questions or concerns. I spent 20 minutes counseling the patient face to face. The total time spent in the appointment was 30 minutes and more than 50% was on counseling.     Mikey Bussing, NP 11/08/2015 2:18 PM

## 2015-11-09 LAB — VITAMIN B12: Vitamin B-12: 239 pg/mL (ref 211–911)

## 2015-11-09 LAB — FOLATE RBC: RBC FOLATE: 646 ng/mL (ref >280)

## 2015-11-24 DIAGNOSIS — H2513 Age-related nuclear cataract, bilateral: Secondary | ICD-10-CM | POA: Diagnosis not present

## 2015-11-24 DIAGNOSIS — H40013 Open angle with borderline findings, low risk, bilateral: Secondary | ICD-10-CM | POA: Diagnosis not present

## 2015-11-24 DIAGNOSIS — H04123 Dry eye syndrome of bilateral lacrimal glands: Secondary | ICD-10-CM | POA: Diagnosis not present

## 2015-11-24 IMAGING — MG MM DIGITAL DIAGNOSTIC UNILAT*R*
4 series · 4 of 4 positions shown · non-contrast
Comparison: 10/08/2014 and earlier priors

CLINICAL DATA: Possible mass right breast identified one view of
the recent screening mammogram.

EXAM:
DIGITAL DIAGNOSTIC  RIGHT MAMMOGRAM WITH CAD
ULTRASOUND RIGHT BREAST

[R CC (1 of 3)]
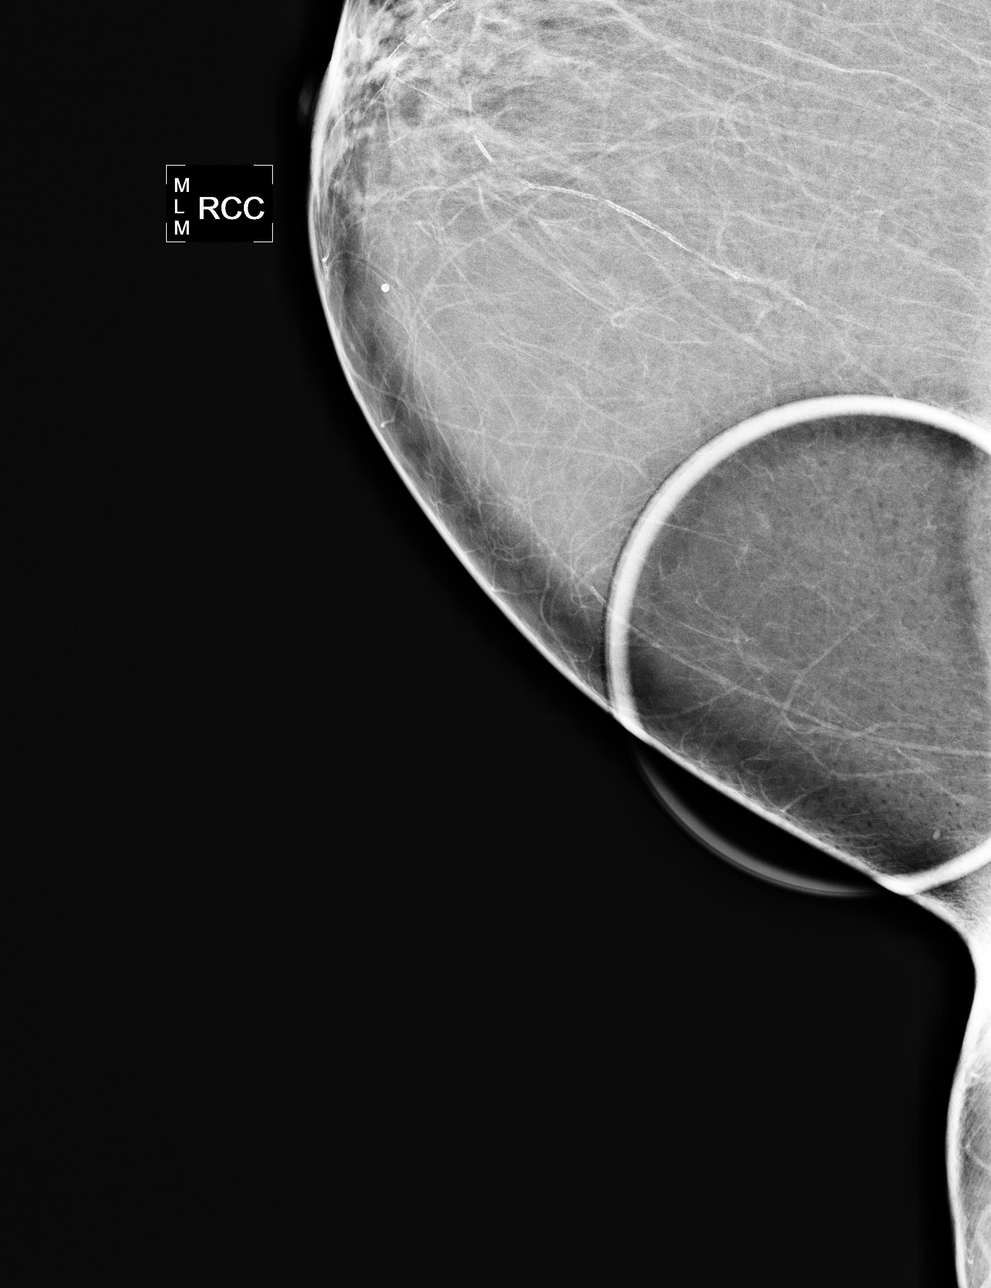

[R MLO]
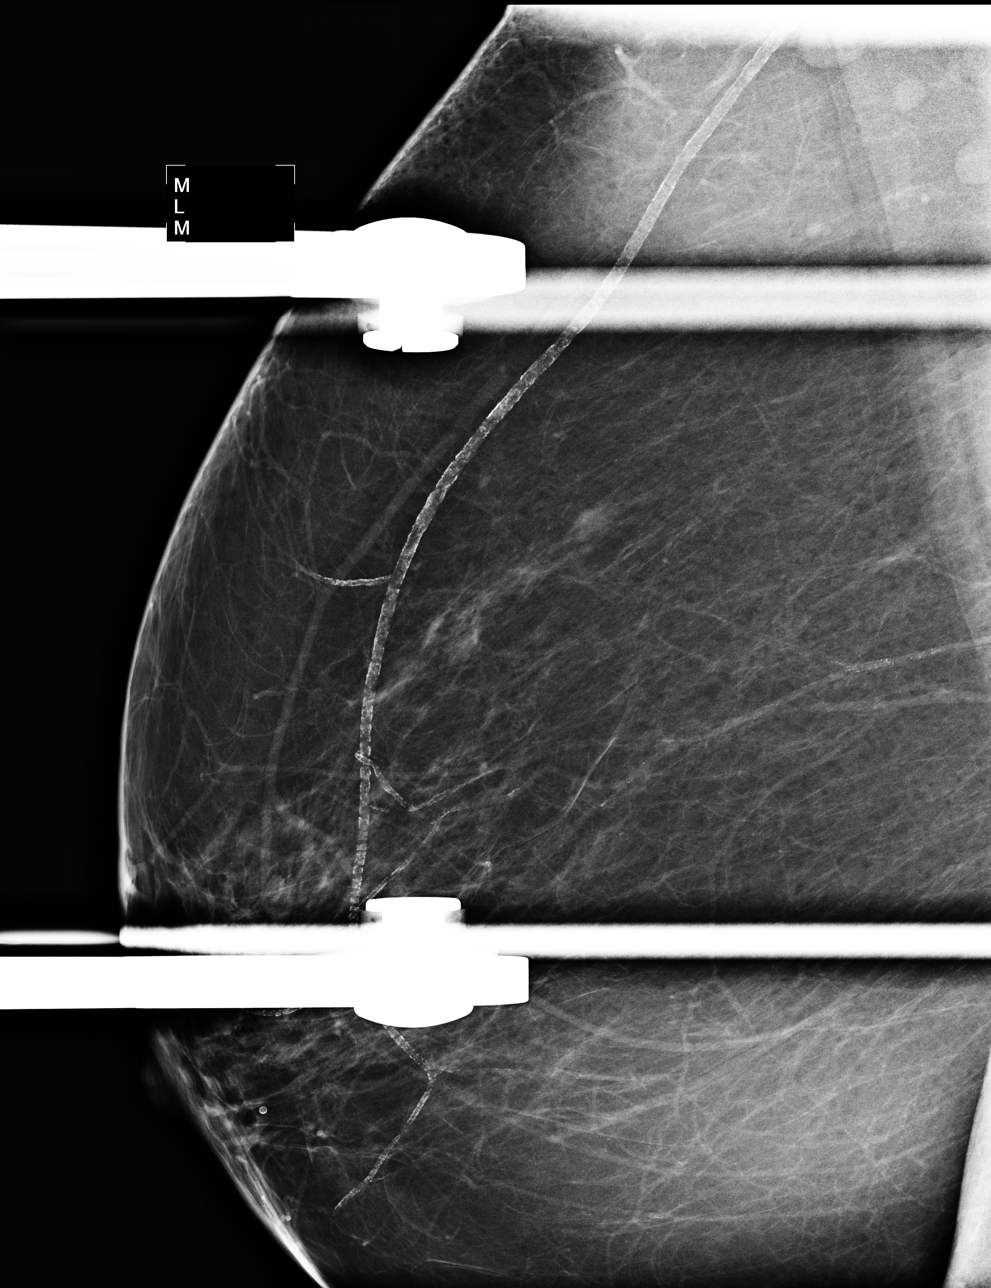

[R CC (2 of 3)]
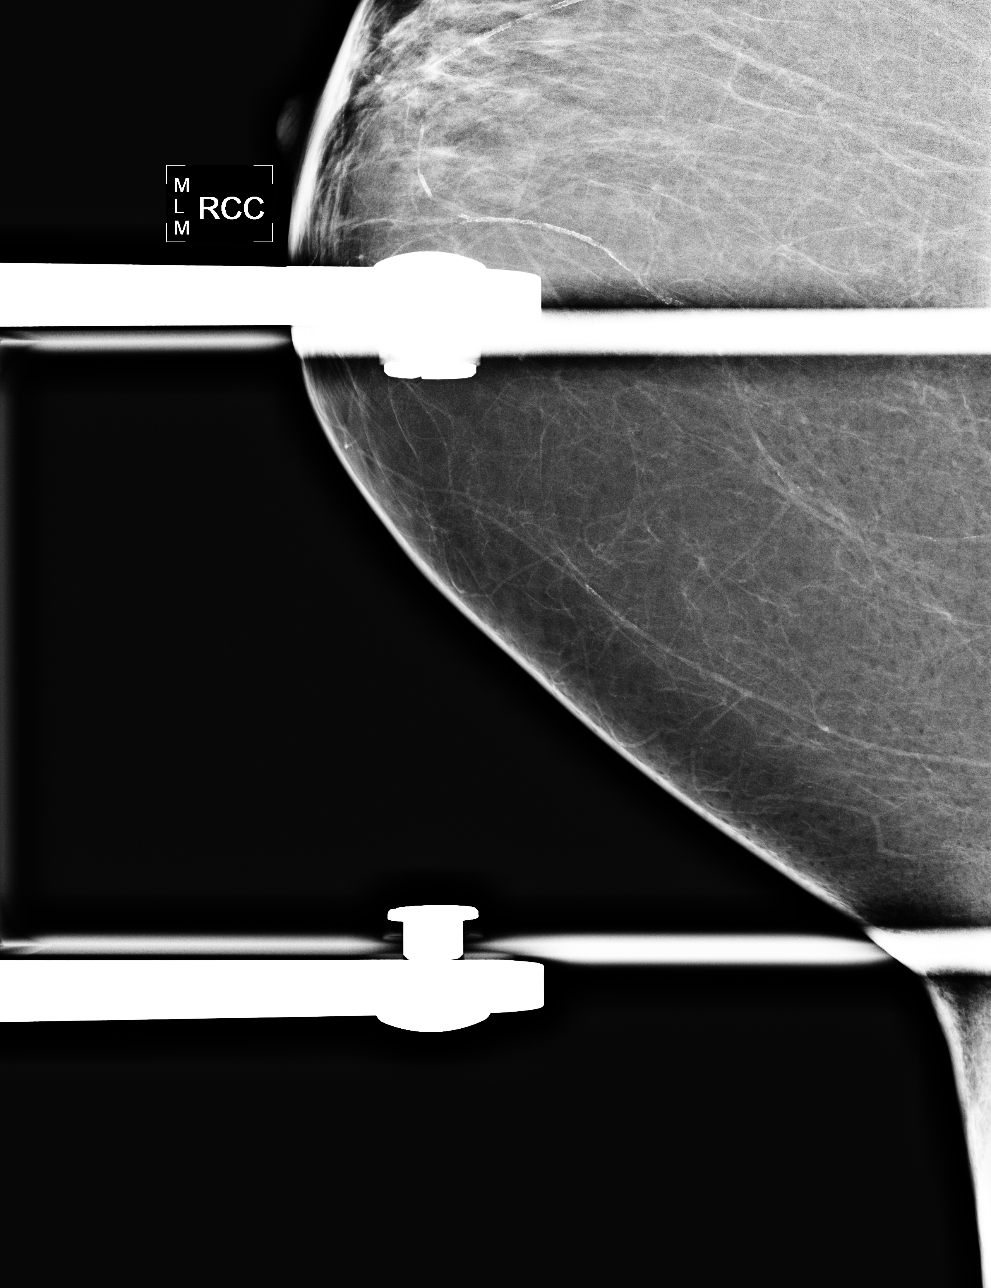

[R CC (3 of 3)]
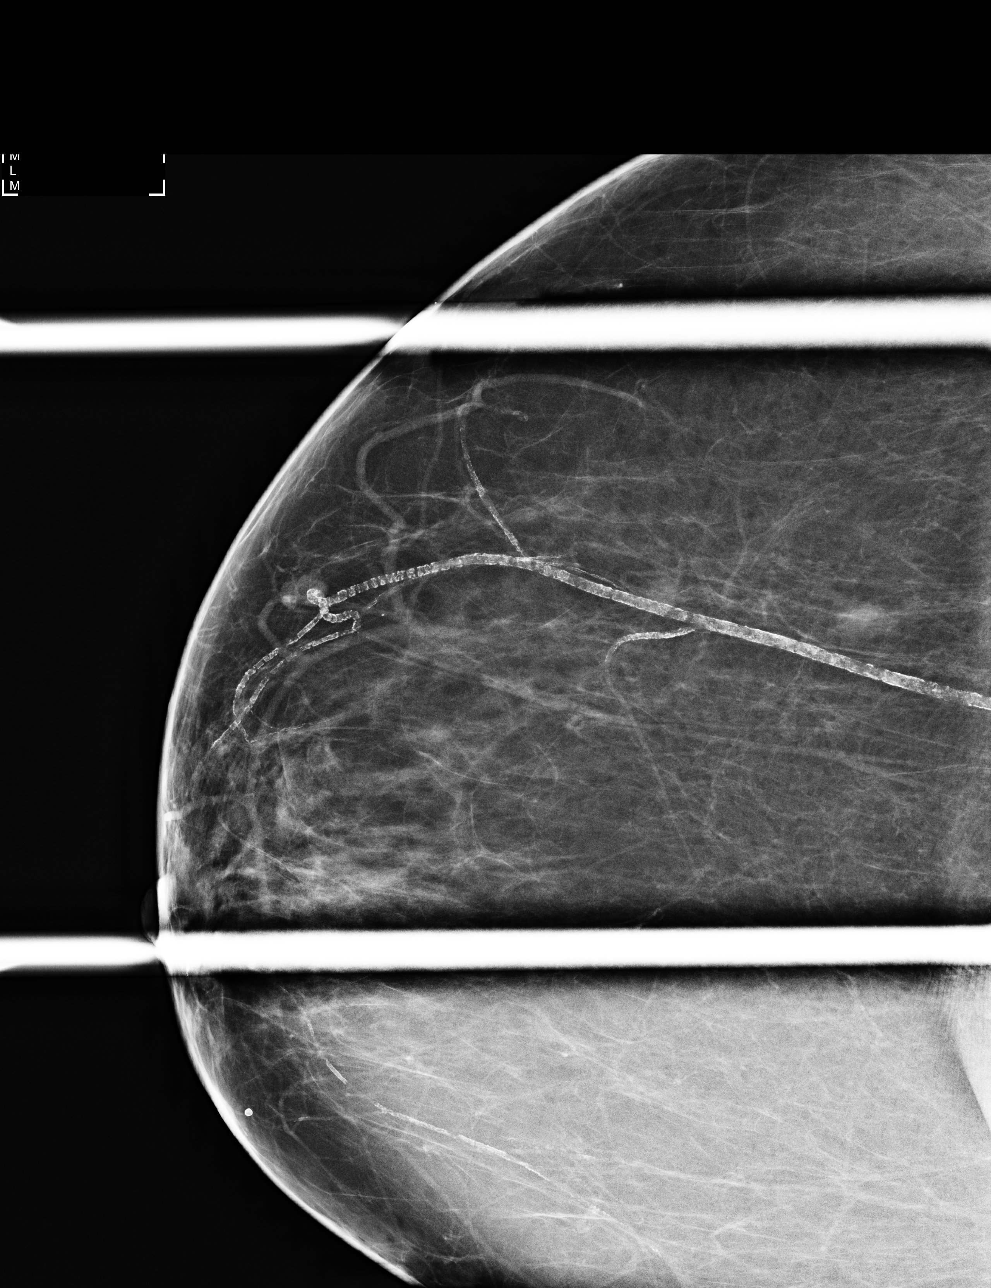

[4 of 4 positions shown; findings below may reference images not displayed]

ACR Breast Density Category b: There are scattered areas of
fibroglandular density.
FINDINGS: Focal spot compression views of the upper outer right breast show a
7 mm in this oval nodule. Spot compression views of the medial right
breast were taken in the region of a mole marker on the screening
mammogram of 10/08/2014. There are no suspicious findings in the
medial right breast.

Mammographic images were processed with CAD.

On physical exam, no mass is palpated in the upper outer right
breast.

Ultrasound is performed, showing an irregular hypoechoic at 10
o'clock position right breast 7 cm from nipple measuring 5 x 4 x 4
mm. No internal vascular flow is detected.

Ultrasound of the right axilla shows lymph nodes that are within
normal limits for sonographic appearances. No lymphadenopathy is
detected.
IMPRESSION: 5 mm mass at 10 o'clock position right breast is suspicious for
malignancy.

RECOMMENDATION:
Ultrasound-guided biopsy is recommended and has been scheduled for
November 04, 2014 at 1 o'clock p.m.

I have discussed the findings and recommendations with the patient.
Results were also provided in writing at the conclusion of the
visit. If applicable, a reminder letter will be sent to the patient
regarding the next appointment.

BI-RADS CATEGORY  4: Suspicious.

## 2016-01-14 IMAGING — MG MM RT RADIOACTIVE SEED LOC MAMMO GUIDE
2 series · 2 of 2 positions shown · non-contrast
Comparison: Previous exam(s)

CLINICAL DATA: Patient presents for radioactive seed localization
of a previously biopsied right breast carcinoma.

EXAM:
MAMMOGRAPHIC GUIDED RADIOACTIVE SEED LOCALIZATION OF THE RIGHT
BREAST

[R CC]
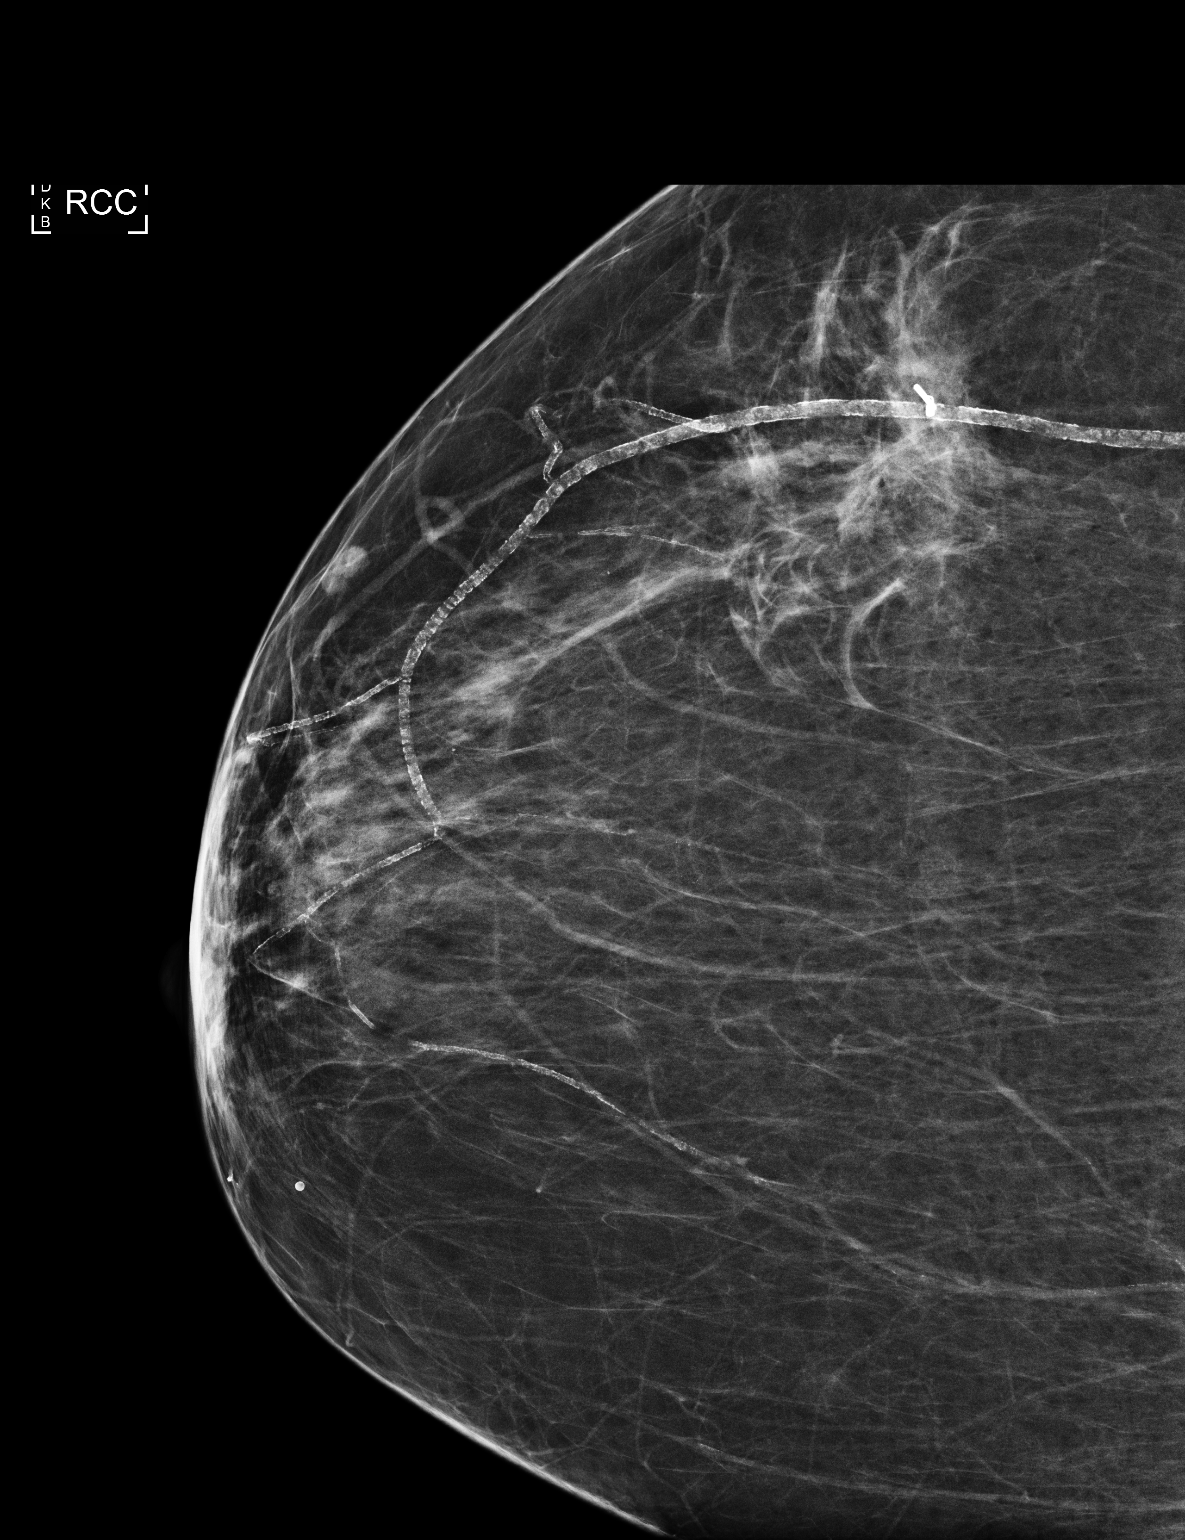

[R ML]
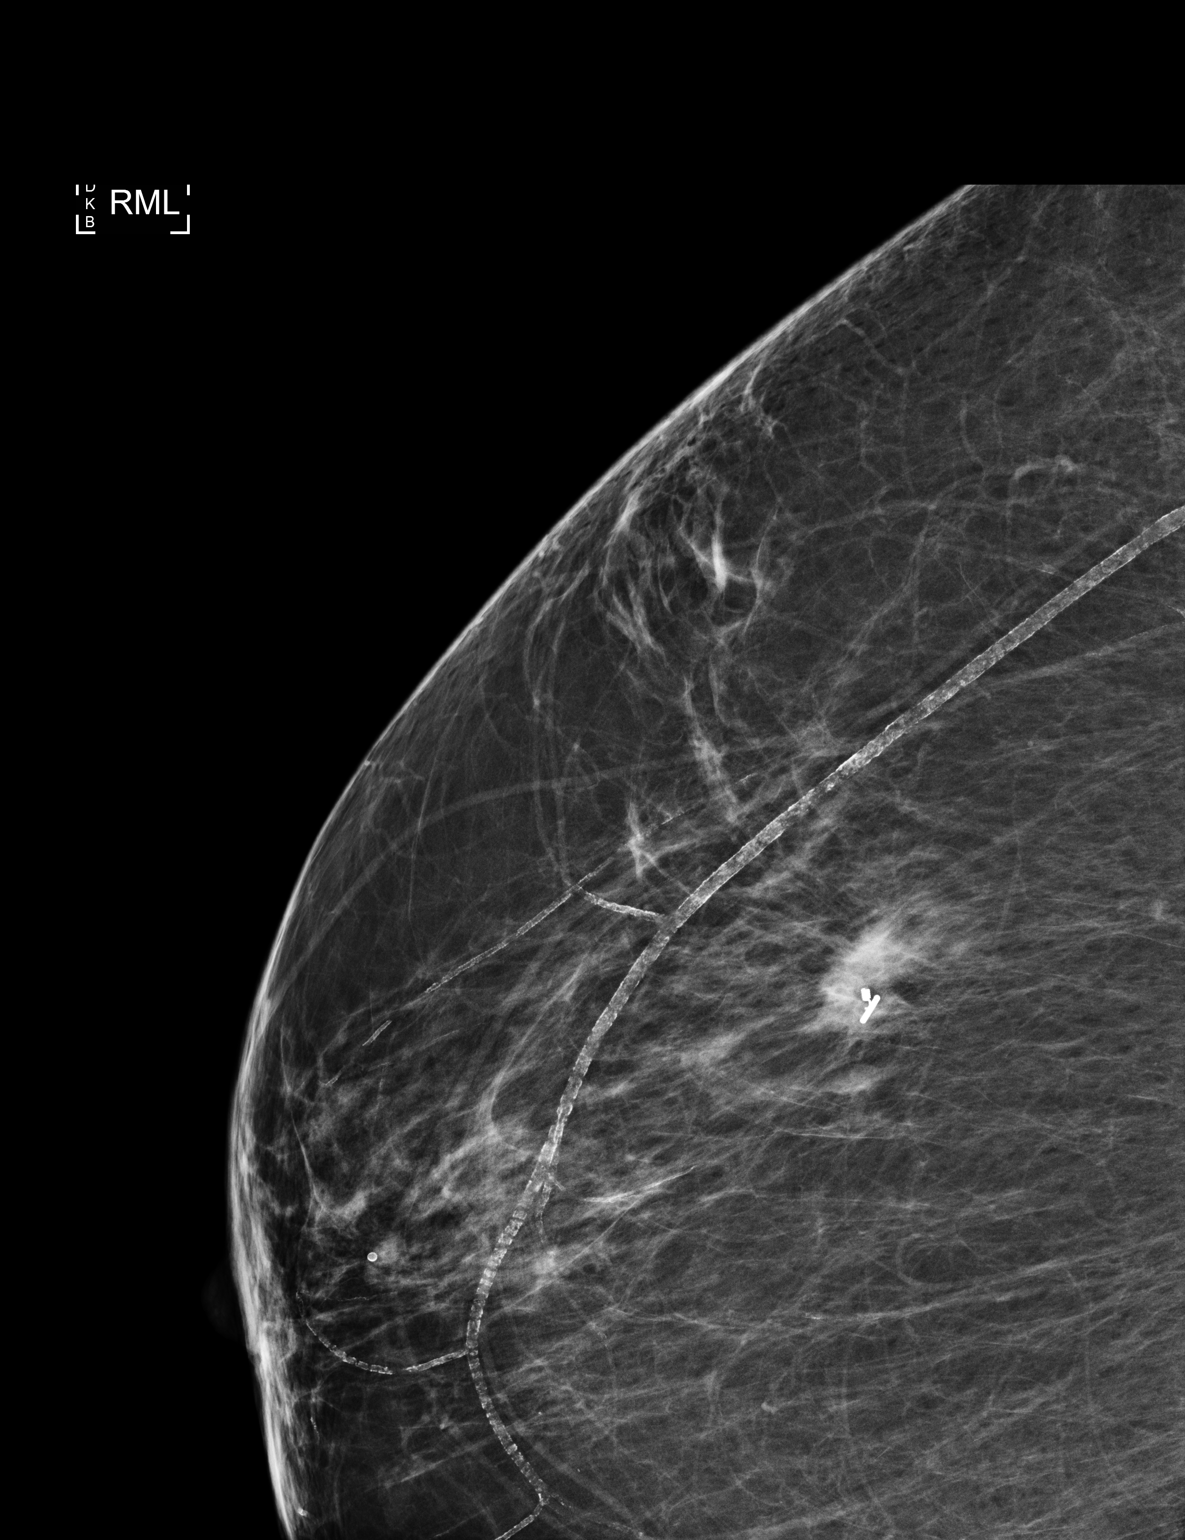

[2 of 2 positions shown; findings below may reference images not displayed]

FINDINGS: Patient presents for radioactive seed localization prior to surgical
excision. I met with the patient and we discussed the procedure of
seed localization including benefits and alternatives. We discussed
the high likelihood of a successful procedure. We discussed the
risks of the procedure including infection, bleeding, tissue injury
and further surgery. We discussed the low dose of radioactivity
involved in the procedure. Informed, written consent was given.

The usual time-out protocol was performed immediately prior to the
procedure.

Using mammographic guidance, sterile technique, 2% lidocaine and an
T-7LD radioactive seed, the right breast biopsy clip was localized
using a cranial to caudal approach. The follow-up mammogram images
confirm the seed in the expected location and are marked for Dr.
L[place]idan.

Follow-up survey of the patient confirms presence of radioactive
seed.

Order number of T-7LD seed:  [id number].

Total activity:  0.250  reference Date: 10/16/2014

The patient tolerated the procedure well and was released from the
[REDACTED]. She was given instructions regarding seed removal.
IMPRESSION: Radioactive seed localization right breast. No apparent
complications.

## 2016-02-07 ENCOUNTER — Encounter: Payer: Self-pay | Admitting: Hematology

## 2016-02-07 ENCOUNTER — Telehealth: Payer: Self-pay | Admitting: Hematology

## 2016-02-07 ENCOUNTER — Ambulatory Visit (HOSPITAL_BASED_OUTPATIENT_CLINIC_OR_DEPARTMENT_OTHER): Payer: Medicare Other | Admitting: Hematology

## 2016-02-07 ENCOUNTER — Other Ambulatory Visit (HOSPITAL_BASED_OUTPATIENT_CLINIC_OR_DEPARTMENT_OTHER): Payer: Medicare Other

## 2016-02-07 VITALS — BP 139/65 | HR 85 | Temp 98.2°F | Resp 17 | Ht 63.0 in | Wt 190.4 lb

## 2016-02-07 DIAGNOSIS — C50411 Malignant neoplasm of upper-outer quadrant of right female breast: Secondary | ICD-10-CM

## 2016-02-07 DIAGNOSIS — M8580 Other specified disorders of bone density and structure, unspecified site: Secondary | ICD-10-CM

## 2016-02-07 DIAGNOSIS — D649 Anemia, unspecified: Secondary | ICD-10-CM | POA: Diagnosis not present

## 2016-02-07 LAB — CBC WITH DIFFERENTIAL/PLATELET
BASO%: 0.7 % (ref 0.0–2.0)
BASOS ABS: 0.1 10*3/uL (ref 0.0–0.1)
EOS ABS: 0.2 10*3/uL (ref 0.0–0.5)
EOS%: 2.7 % (ref 0.0–7.0)
HCT: 34.9 % (ref 34.8–46.6)
HEMOGLOBIN: 11.2 g/dL — AB (ref 11.6–15.9)
LYMPH%: 33.9 % (ref 14.0–49.7)
MCH: 29.5 pg (ref 25.1–34.0)
MCHC: 32.1 g/dL (ref 31.5–36.0)
MCV: 91.8 fL (ref 79.5–101.0)
MONO#: 0.9 10*3/uL (ref 0.1–0.9)
MONO%: 11.3 % (ref 0.0–14.0)
NEUT#: 4 10*3/uL (ref 1.5–6.5)
NEUT%: 51.4 % (ref 38.4–76.8)
Platelets: 200 10*3/uL (ref 145–400)
RBC: 3.8 10*6/uL (ref 3.70–5.45)
RDW: 14.5 % (ref 11.2–14.5)
WBC: 7.8 10*3/uL (ref 3.9–10.3)
lymph#: 2.6 10*3/uL (ref 0.9–3.3)

## 2016-02-07 LAB — COMPREHENSIVE METABOLIC PANEL
ALBUMIN: 3.8 g/dL (ref 3.5–5.0)
ALK PHOS: 83 U/L (ref 40–150)
ALT: 16 U/L (ref 0–55)
AST: 17 U/L (ref 5–34)
Anion Gap: 8 mEq/L (ref 3–11)
BUN: 29.9 mg/dL — AB (ref 7.0–26.0)
CHLORIDE: 110 meq/L — AB (ref 98–109)
CO2: 25 meq/L (ref 22–29)
Calcium: 10 mg/dL (ref 8.4–10.4)
Creatinine: 0.9 mg/dL (ref 0.6–1.1)
EGFR: 70 mL/min/{1.73_m2} — AB (ref >90)
GLUCOSE: 96 mg/dL (ref 70–140)
POTASSIUM: 4.5 meq/L (ref 3.5–5.1)
SODIUM: 142 meq/L (ref 136–145)
Total Bilirubin: 0.32 mg/dL (ref 0.20–1.20)
Total Protein: 7.7 g/dL (ref 6.4–8.3)

## 2016-02-07 NOTE — Progress Notes (Signed)
[place] NOTE  Patient Care Team: Nolene Ebbs, MD as PCP - General (Internal Medicine) Autumn Messing III, MD as Consulting Physician (General Surgery) Truitt Merle, MD as Consulting Physician (Hematology) Thea Silversmith, MD as Consulting Physician (Radiation Oncology) Holley Bouche, NP as Nurse Practitioner (Nurse Practitioner)    Breast cancer of upper-outer quadrant of right female breast [facility])   10/26/2014 Imaging screening mammogram showed a 72m mass in right breast at 10 o'clock position.    11/05/2014 Pathology Results Biopsy showed grade 2 IDC, ER 90%+, PR 10%+, HER2-, with grade 1 DCIS    11/06/2014 Initial Diagnosis Breast cancer of upper-outer quadrant of right female breast   12/17/2014 Definitive Surgery Right lumpectomy with SLNB revealed grade 2, IDC spanning 1.1 cm; associated grade 2 DCIS. HER2 repeated and remains negative. Surgical margins clear.    12/17/2014 Pathologic Stage pT1cpN0M0; Stage IA   01/07/2015 -  Anti-estrogen oral therapy Patient to start Aromasin on 01/14/2015.  Planned duration of therapy is 5 years. (End date: 12/2019).   01/12/2015 Survivorship Patient eligible for Survivorship after having completed all anti-cancer treatments (with exception of anti-estrogen) and currently NED.    CHIEF COMPLAINTS/PURPOSE OF CONSULTATION:  Breast cancer   HISTORY OF PRESENTING ILLNESS:  Erica Whitney 75y.o. female is here because of recent diagnosed breast cancer.  She had routine mammogram screening on 10/26/2014, which showed 5 mm mass in the right breast 10:00 position. She denies any pain or any new symptoms. 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 Ki-67 15%. She had breast MRI on 11/24 2015, which showed a 7 mm enhancing lesion in the right breast biopsy site.  She lives alone, able to take care of her daily needs  independently, but she is not very physically active overall. She has chronic bilateral knee pain arm pain from arthritis, and she and appetite is moderate. She has insomnia and hot flashes. She denies any other new symptoms. She does not drive, her daughter lives close to her and presents to clinic with her today.   CURRENT THERAPY: Aromasin 25 mg once daily, started on 01/07/2015  INTERIM HISTORY: Erica MWynderreturns for follow up.  She is accompanied by her daughter to clinic today. She doing moderately well overall. Her main complaint is arthritis, involving her bilateral knees, shoulders, she also complains of stiffness of her legs and joints. She has been followed with her primary care physician for her arthritis. She denies any significant change of her arthritis related to pain since she started Aromasin one year ago.  She has mild hot flash, but manageable. She is able to function well at home, does not exercise regularly except walking a few times a week. No other new complaints.    MEDICAL HISTORY:  Past Medical History  Diagnosis Date  . Hypertension   . Diabetes mellitus   . Hypercholesteremia   . Peptic ulcer   . Arthritis   . Insomnia   . Depression   . 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 Abdominal hysterectomy  1995  . Back surgery  2000    lumb lam  . Colonoscopy    . Orif metacarpal fracture  2012    left  . Radioactive seed guided mastectomy  with axillary sentinel lymph node biopsy Right 12/17/2014    Procedure: RIGHT BREAST RADIOACTIVE SEED LOCALIZED LUMPECTOMY AND SENTINEL NODE MAPPING;  Surgeon: Autumn Messing III, MD;  Location: [facility];  Service: General;  Laterality: Right;  Lumber spine surgery 15 years   SOCIAL HISTORY: History   Social History  . Marital Status: Single    Spouse  Name: N/A    Number of Children: 4, 2 girls, 2 boys, age 37-52   . Years of Education: N/A  She lives alone, her daughter lives close to her   Occupational History   Cleaning     Social History Main Topics  . Smoking status: Never Smoker   . Smokeless tobacco: Current User    Types: Snuff  . Alcohol Use: No  . Drug Use: No  . Sexual Activity: Not on file     FAMILY HISTORY: Mother had lung cancer at age of 98 Sister had breast ca at age of 85 Brother had lung cancer at 20 No other family history of malignancy.  ALLERGIES:  has No Known Allergies.  MEDICATIONS:  Current Outpatient Prescriptions  Medication Sig Dispense Refill  . aspirin EC 81 MG tablet Take 81 mg by mouth daily.      Marland Kitchen atorvastatin (LIPITOR) 40 MG tablet Take 40 mg by mouth daily.      . Calcium Carbonate-Vitamin D (CALCIUM 600+D) 600-400 MG-UNIT per tablet Take 1 tablet by mouth daily.      . cycloSPORINE (RESTASIS) 0.05 % ophthalmic emulsion Place 1 drop into both eyes 2 (two) times daily.      Marland Kitchen dextromethorphan-guaiFENesin (MUCINEX DM) 30-600 MG per 12 hr tablet Take 1 tablet by mouth 2 (two) times daily.    Marland Kitchen exemestane (AROMASIN) 25 MG tablet Take 1 tablet (25 mg total) by mouth daily after breakfast. 30 tablet 5  . glimepiride (AMARYL) 4 MG tablet Take 4 mg by mouth 2 (two) times daily with a meal.      . lisinopril-hydrochlorothiazide (PRINZIDE,ZESTORETIC) 10-12.5 MG per tablet Take 1 tablet by mouth daily.      . metFORMIN (GLUCOPHAGE) 1000 MG tablet As directed    . pantoprazole (PROTONIX) 40 MG tablet Take 40 mg by mouth daily.    . sertraline (ZOLOFT) 100 MG tablet Take 100 mg by mouth every morning.      . triamcinolone cream (KENALOG) 0.5 %     . vitamin C (ASCORBIC ACID) 500 MG tablet Take 500 mg by mouth daily.      Marland Kitchen zolpidem (AMBIEN) 10 MG tablet Take 5 mg by mouth at bedtime as needed. She takes four nights a week for sleep.     Marland Kitchen NIFEdipine (PROCARDIA XL/ADALAT-CC) 60 MG 24 hr tablet Take  60 mg by mouth daily.       No current facility-administered medications for this visit.    REVIEW OF SYSTEMS:   Constitutional: Denies fevers, chills or abnormal night sweats Eyes: Denies blurriness of vision, double vision or watery eyes Ears, nose, mouth, throat, and face: Denies mucositis or sore throat Respiratory: Denies cough, dyspnea or wheezes Cardiovascular: Denies palpitation, chest discomfort or lower extremity swelling Gastrointestinal:  Denies nausea, heartburn or change in bowel habits Skin: Denies abnormal skin rashes Lymphatics: Denies new lymphadenopathy or easy bruising Neurological:Denies numbness, tingling or new weaknesses Behavioral/Psych: Mood is stable, no new changes  All other systems were reviewed with the patient and are negative. (+) joints pain in knees and arms   PHYSICAL EXAMINATION:  ECOG PERFORMANCE STATUS: 1  Filed Vitals:   02/07/16 1441  BP: 139/65  Pulse: 85  Temp: 98.2 F (36.8 C)  Resp: 17   Filed Weights   02/07/16 1441  Weight: 190 lb 6.4 oz (86.365 kg)    GENERAL:alert, no distress and comfortable SKIN: skin color, texture, turgor are normal, no rashes or significant lesions EYES: normal, conjunctiva are pink and non-injected, sclera clear OROPHARYNX:no exudate, no erythema and lips, buccal mucosa, and tongue normal  NECK: supple, thyroid normal size, non-tender, without nodularity LYMPH:  no palpable lymphadenopathy in the cervical, axillary or inguinal LUNGS: clear to auscultation and percussion with normal breathing effort HEART: regular rate & rhythm and no murmurs and no lower extremity edema ABDOMEN:abdomen soft, non-tender and normal bowel sounds Musculoskeletal:no cyanosis of digits and no clubbing  PSYCH: alert & oriented x 3 with fluent speech NEURO: no focal motor/sensory deficits Breasts: Breast inspection showed them to be symmetrical with no nipple discharge. The surgical scar in right breast and right axilla are  well-healed. Palpation of the both breasts and axilla revealed no obvious mass that I could appreciate.   LABORATORY DATA:  CBC Latest Ref Rng 02/07/2016 11/08/2015 08/06/2015  WBC 3.9 - 10.3 10e3/uL 7.8 7.4 6.8  Hemoglobin 11.6 - 15.9 g/dL 11.2(L) 10.7(L) 10.7(L)  Hematocrit 34.8 - 46.6 % 34.9 33.4(L) 33.1(L)  Platelets 145 - 400 10e3/uL 200 198 197    CMP Latest Ref Rng 02/07/2016 11/08/2015 08/06/2015  Glucose 70 - 140 mg/dl 96 100 151(H)  BUN 7.0 - 26.0 mg/dL 29.9(H) 29.8(H) 29.3(H)  Creatinine 0.6 - 1.1 mg/dL 0.9 1.1 1.0  Sodium 136 - 145 mEq/L 142 144 143  Potassium 3.5 - 5.1 mEq/L 4.5 4.0 4.6  CO2 22 - 29 mEq/L _0 Calcium 8.4 - 10.4 mg/dL 10.0 9.8 9.9  Total Protein 6.4 - 8.3 g/dL 7.7 7.5 7.2  Total Bilirubin 0.20 - 1.20 mg/dL 0.32 0.42 0.27  Alkaline Phos 40 - 150 U/L 83 83 78  AST 5 - 34 U/L _1 ALT 0 - 55 U/L _2 PATHOLOGY REPORT 1. Breast, partial mastectomy, Right 12/17/2014 - INVASIVE DUCTAL CARCINOMA, GRADE II/III, SPANNING 1.1 CM. - DUCTAL CARCINOMA IN SITU, INTERMEDIATE GRADE. - THE SURGICAL RESECTION MARGINS OF SPECIMEN #1 ARE NEGATIVE FOR CARCINOMA. - SEE ONCOLOGY TABLE BELOW. 2. Lymph node, sentinel, biopsy, right axilla - THERE IS NO EVIDENCE OF CARCINOMA IN 1 OF 1 LYMPH NODE (0/1). 1 of 3 Duplicate copy FINAL for Erica Whitney, Erica Whitney [phone]) Diagnosis(continued) 3. Breast, biopsy, additional right medial margin - INVASIVE DUCTAL CARCINOMA. - DUCTAL CARCINOMA IN SITU. - DUCTAL CARCINOMA IN SITU IS FOCALLY LESS THAN 0.1 CM TO THE MEDIAL MARGIN OF SPECIMEN #3.  Specimen, including laterality and lymph node sampling (sentinel, non-sentinel): Right breast and right axillary sentinel node. Procedure: Seed localized lumpectomy, additional medial margin resection, and sentinel lymph node resection x1. Histologic type: Ductal. Grade: II. Tubule formation: 3 Nuclear pleomorphism: 2 Mitotic: 1 Tumor size (gross measurement): 1.1  cm. Margins: Invasive, distance to closest margin: Greater than 0.2 cm to all margins. In-situ, distance to closest margin: Focally less than 0.1 cm to the medial margin of specimen #3. Lymphovascular invasion: Not identified. Ductal carcinoma in situ: Present. Grade: Intermediate. Extensive intraductal component: Yes. Lobular neoplasia: Not identified. Tumor focality: Unifocal. Treatment effect: N/A. Extent of tumor: Confined to breast parenchyma. Lymph nodes: Examined: 1 Sentinel 0 Non-sentinel 1 Total Lymph nodes with  metastasis: 0. Breast prognostic profile: [phone]. Estrogen receptor: 100%, strong staining intensity. Progesterone receptor: 6%, strong staining intensity. Her 2 neu: No amplification was detected. The ratio was 1.52. Her 2 neu by [facility] will be repeated on the current case and the results reported separately. Ki-67: 18%. Non-neoplastic breast: Healing biopsy site. TNM: pT1c, pN0. (JBK:ds 12/21/14)  Results: HER-2/NEU BY [facility] - NO AMPLIFICATION OF HER-2 DETECTED. RESULT RATIO OF HER2: CEP 17 SIGNALS 1.11 AVERAGE HER2 COPY NUMBER PER CELL 2.10 - RADIOGRAPHIC STUDIES: I have personally reviewed the radiological images as listed and agreed with the findings in the report.  Bone density scan 02/17/2015 Impression: Osteopenia, T score at left femural neck -1.3  Mammogram diagnostic B/L  11/05/2015 IMPRESSION: Interval lumpectomy surgical changes in the lateral right breast. No mammographic evidence of malignancy in the bilateral breasts.  RECOMMENDATION: Diagnostic mammogram is suggested in 1 year. (Code:DM-B-01Y)   ASSESSMENT & PLAN:  Erica Whitney is a 75 year old female with multiple comorbidities, including hypertension, diabetes, arthritis with chronic pain, depression and insomnia, who was recently found to have 5-7 mm right breast mass at 10 o'clock position.   1. 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 -She is status post right lumpectomy and sentinel lymph node biopsy, with negative surgical margins. I reviewed her surgical pathology results with patient and her daughter.   -Per pt, Dr. Pablo Ledger did not recommend adjuvant radiation -I strongly recommended her to consider adjuvant AI for a total of 5 years to reduce her risks of cancer recurrence.  -She is tolerating Aromasin very well, we'll continue, a total of 5 years -she is clinically doing well, her exam, today's lab are unremarkable, last mammogram in November 2016 was normal -I encouraged her to do regular exercise. -Calcium and vitamin D supplement for bone health.   2. Osteopenia -Her recent bone density scan reviewed osteopenia, T score at left femoral neck -1.3 - I encouraged her to continue calcium and vitamin D. - we discussed that Aromasin may worsen her osteopenia     3. HTN, DM, arthritis -she has moderate arthritis related pain, follows up with her primary care physician, does not want to do x-ray or see a specialist -She will continue her medication and a follow-up with her primary care physician. -She will check her blood pressure at home.  4. Mild normocytic anemia -She has been mildly anemic since last seen. Hemoglobin in the range of 11-12, 10.7 today -our study, Y70 and folic acid was normal in November 2016 -We'll continue follow  PLAN: -continue Aromasin -RTC in 4 month for follow up with lab  All questions were answered. The patient knows to call the clinic with any problems, questions or concerns. I spent 20 minutes counseling the patient face to face. The total time spent in the appointment was 30 minutes and more than 50% was on counseling.     Truitt Merle, MD 02/07/2016 3:46 PM

## 2016-02-07 NOTE — Telephone Encounter (Signed)
Gave patient avs report and appointments for June.  °

## 2016-03-09 ENCOUNTER — Other Ambulatory Visit: Payer: Self-pay | Admitting: Internal Medicine

## 2016-03-09 DIAGNOSIS — E2839 Other primary ovarian failure: Secondary | ICD-10-CM

## 2016-03-09 DIAGNOSIS — R5381 Other malaise: Secondary | ICD-10-CM

## 2016-04-27 ENCOUNTER — Other Ambulatory Visit: Payer: Self-pay | Admitting: Hematology

## 2016-05-24 ENCOUNTER — Telehealth: Payer: Self-pay | Admitting: Hematology

## 2016-05-24 NOTE — Telephone Encounter (Signed)
cld pt and left message thqat appt has been moved to 7/7@2 :00 from 6/12

## 2016-05-29 ENCOUNTER — Other Ambulatory Visit: Payer: Self-pay

## 2016-05-29 ENCOUNTER — Ambulatory Visit: Payer: Self-pay | Admitting: Hematology

## 2016-06-23 ENCOUNTER — Ambulatory Visit (HOSPITAL_BASED_OUTPATIENT_CLINIC_OR_DEPARTMENT_OTHER): Payer: Medicare Other | Admitting: Hematology

## 2016-06-23 ENCOUNTER — Other Ambulatory Visit (HOSPITAL_BASED_OUTPATIENT_CLINIC_OR_DEPARTMENT_OTHER): Payer: Medicare Other

## 2016-06-23 ENCOUNTER — Encounter: Payer: Self-pay | Admitting: Hematology

## 2016-06-23 ENCOUNTER — Telehealth: Payer: Self-pay | Admitting: Hematology

## 2016-06-23 VITALS — BP 136/68 | HR 84 | Temp 98.0°F | Resp 18 | Ht 63.0 in | Wt 188.5 lb

## 2016-06-23 DIAGNOSIS — I10 Essential (primary) hypertension: Secondary | ICD-10-CM

## 2016-06-23 DIAGNOSIS — C50411 Malignant neoplasm of upper-outer quadrant of right female breast: Secondary | ICD-10-CM

## 2016-06-23 DIAGNOSIS — D649 Anemia, unspecified: Secondary | ICD-10-CM

## 2016-06-23 DIAGNOSIS — E119 Type 2 diabetes mellitus without complications: Secondary | ICD-10-CM

## 2016-06-23 DIAGNOSIS — M859 Disorder of bone density and structure, unspecified: Secondary | ICD-10-CM | POA: Diagnosis not present

## 2016-06-23 DIAGNOSIS — M8580 Other specified disorders of bone density and structure, unspecified site: Secondary | ICD-10-CM

## 2016-06-23 LAB — COMPREHENSIVE METABOLIC PANEL
ALT: 13 U/L (ref 0–55)
ANION GAP: 9 meq/L (ref 3–11)
AST: 17 U/L (ref 5–34)
Albumin: 3.7 g/dL (ref 3.5–5.0)
Alkaline Phosphatase: 79 U/L (ref 40–150)
BILIRUBIN TOTAL: 0.39 mg/dL (ref 0.20–1.20)
BUN: 23.8 mg/dL (ref 7.0–26.0)
CHLORIDE: 108 meq/L (ref 98–109)
CO2: 26 meq/L (ref 22–29)
Calcium: 10 mg/dL (ref 8.4–10.4)
Creatinine: 1 mg/dL (ref 0.6–1.1)
EGFR: 65 mL/min/{1.73_m2} — AB (ref >90)
GLUCOSE: 115 mg/dL (ref 70–140)
POTASSIUM: 4.4 meq/L (ref 3.5–5.1)
SODIUM: 143 meq/L (ref 136–145)
TOTAL PROTEIN: 7.6 g/dL (ref 6.4–8.3)

## 2016-06-23 LAB — CBC WITH DIFFERENTIAL/PLATELET
BASO%: 0.3 % (ref 0.0–2.0)
BASOS ABS: 0 10*3/uL (ref 0.0–0.1)
EOS ABS: 0.2 10*3/uL (ref 0.0–0.5)
EOS%: 2.8 % (ref 0.0–7.0)
HCT: 33.6 % — ABNORMAL LOW (ref 34.8–46.6)
HGB: 10.8 g/dL — ABNORMAL LOW (ref 11.6–15.9)
LYMPH%: 36.5 % (ref 14.0–49.7)
MCH: 29.6 pg (ref 25.1–34.0)
MCHC: 32.1 g/dL (ref 31.5–36.0)
MCV: 92.1 fL (ref 79.5–101.0)
MONO#: 0.7 10*3/uL (ref 0.1–0.9)
MONO%: 8.9 % (ref 0.0–14.0)
NEUT#: 3.9 10*3/uL (ref 1.5–6.5)
NEUT%: 51.5 % (ref 38.4–76.8)
PLATELETS: 211 10*3/uL (ref 145–400)
RBC: 3.65 10*6/uL — AB (ref 3.70–5.45)
RDW: 14.5 % (ref 11.2–14.5)
WBC: 7.5 10*3/uL (ref 3.9–10.3)
lymph#: 2.7 10*3/uL (ref 0.9–3.3)

## 2016-06-23 NOTE — Progress Notes (Signed)
[place] NOTE  Patient Care Team: Nolene Ebbs, MD as PCP - General (Internal Medicine) Autumn Messing III, MD as Consulting Physician (General Surgery) Truitt Merle, MD as Consulting Physician (Hematology) Thea Silversmith, MD as Consulting Physician (Radiation Oncology) Holley Bouche, NP as Nurse Practitioner (Nurse Practitioner)    Breast cancer of upper-outer quadrant of right female breast [facility])   10/26/2014 Imaging screening mammogram showed a 65m mass in right breast at 10 o'clock position.    11/05/2014 Pathology Results Biopsy showed grade 2 IDC, ER 90%+, PR 10%+, HER2-, with grade 1 DCIS    11/06/2014 Initial Diagnosis Breast cancer of upper-outer quadrant of right female breast   12/17/2014 Definitive Surgery Right lumpectomy with SLNB revealed grade 2, IDC spanning 1.1 cm; associated grade 2 DCIS. HER2 repeated and remains negative. Surgical margins clear.    12/17/2014 Pathologic Stage pT1cpN0M0; Stage IA   01/07/2015 -  Anti-estrogen oral therapy Patient to start Aromasin on 01/14/2015.  Planned duration of therapy is 5 years. (End date: 12/2019).   01/12/2015 Survivorship Patient eligible for Survivorship after having completed all anti-cancer treatments (with exception of anti-estrogen) and currently NED.    CHIEF COMPLAINTS:  Follow up right breast cancer   HISTORY OF PRESENTING ILLNESS:  [name].[id number]. female is here because of recent diagnosed breast cancer.  She had routine mammogram screening on 10/26/2014, which showed 5 mm mass in the right breast 10:00 position. She denies any pain or any new symptoms. 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 Ki-67 15%. She had breast MRI on 11/24 2015, which showed a 7 mm enhancing lesion in the right breast biopsy site.  She lives alone, able to take care of her daily needs independently, but  she is not very physically active overall. She has chronic bilateral knee pain arm pain from arthritis, and she and appetite is moderate. She has insomnia and hot flashes. She denies any other new symptoms. She does not drive, her daughter lives close to her and presents to clinic with her today.   CURRENT THERAPY: Exemestane 25 mg once daily, started on 01/07/2015  INTERIM HISTORY: Erica MCoppessreturns for follow up.  She is accompanied by her daughter to clinic today. She is doing well overall. She still has arthralgias in her knees and the shoulders, tolerable. She is compliant with exemestane, and tolerates well. No significant hot flash, no other new complaints. She is not very physically active, but functions well at home.   MEDICAL HISTORY:  Past Medical History  Diagnosis Date  . Hypertension   . Diabetes mellitus   . Hypercholesteremia   . Peptic ulcer   . Arthritis   . Insomnia   . Depression   . 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 Abdominal hysterectomy  1995  . Back surgery  2000    lumb lam  . Colonoscopy    . Orif metacarpal fracture  2012    left  . Radioactive seed guided mastectomy with axillary sentinel lymph node biopsy Right 12/17/2014    Procedure: RIGHT BREAST RADIOACTIVE SEED LOCALIZED LUMPECTOMY AND SENTINEL NODE MAPPING;  Surgeon: PAutumn MessingIII, MD;  Location: [facility]  Service: General;  Laterality: Right;  Lumber spine  surgery 15 years   SOCIAL HISTORY: History   Social History  . Marital Status: Single    Spouse Name: N/A    Number of Children: 4, 2 girls, 2 boys, age 73-52   . Years of Education: N/A  She lives alone, her daughter lives close to her   Occupational History   Cleaning     Social History Main Topics  . Smoking status: Never Smoker   . Smokeless  tobacco: Current User    Types: Snuff  . Alcohol Use: No  . Drug Use: No  . Sexual Activity: Not on file     FAMILY HISTORY: Mother had lung cancer at age of 53 Sister had breast ca at age of 54 Brother had lung cancer at 69 No other family history of malignancy.  ALLERGIES:  has No Known Allergies.  MEDICATIONS:  Current Outpatient Prescriptions  Medication Sig Dispense Refill  . aspirin EC 81 MG tablet Take 81 mg by mouth daily.      Marland Kitchen atorvastatin (LIPITOR) 40 MG tablet Take 40 mg by mouth daily.      . Calcium Carbonate-Vitamin D (CALCIUM 600+D) 600-400 MG-UNIT per tablet Take 1 tablet by mouth daily.      . cycloSPORINE (RESTASIS) 0.05 % ophthalmic emulsion Place 1 drop into both eyes 2 (two) times daily.      Marland Kitchen dextromethorphan-guaiFENesin (MUCINEX DM) 30-600 MG per 12 hr tablet Take 1 tablet by mouth 2 (two) times daily.    Marland Kitchen exemestane (AROMASIN) 25 MG tablet TAKE 1 TABLET BY MOUTH EVERY DAY AFTER BREAKFAST 30 tablet 4  . glimepiride (AMARYL) 4 MG tablet Take 4 mg by mouth 2 (two) times daily with a meal.      . lisinopril-hydrochlorothiazide (PRINZIDE,ZESTORETIC) 10-12.5 MG per tablet Take 1 tablet by mouth daily.      . metFORMIN (GLUCOPHAGE) 1000 MG tablet As directed    . NIFEdipine (PROCARDIA XL/ADALAT-CC) 60 MG 24 hr tablet Take 60 mg by mouth daily.      . pantoprazole (PROTONIX) 40 MG tablet Take 40 mg by mouth daily.    . sertraline (ZOLOFT) 100 MG tablet Take 100 mg by mouth every morning.      . triamcinolone cream (KENALOG) 0.5 %     . vitamin C (ASCORBIC ACID) 500 MG tablet Take 500 mg by mouth daily.      Marland Kitchen zolpidem (AMBIEN) 10 MG tablet Take 5 mg by mouth at bedtime as needed. She takes four nights a week for sleep.      No current facility-administered medications for this visit.    REVIEW OF SYSTEMS:   Constitutional: Denies fevers, chills or abnormal night sweats Eyes: Denies blurriness of vision, double vision or watery eyes Ears, nose, mouth,  throat, and face: Denies mucositis or sore throat Respiratory: Denies cough, dyspnea or wheezes Cardiovascular: Denies palpitation, chest discomfort or lower extremity swelling Gastrointestinal:  Denies nausea, heartburn or change in bowel habits Skin: Denies abnormal skin rashes Lymphatics: Denies new lymphadenopathy or easy bruising Neurological:Denies numbness, tingling or new weaknesses Behavioral/Psych: Mood is stable, no new changes  All other systems were reviewed with the patient and are negative. (+) joints pain in knees and arms   PHYSICAL EXAMINATION: ECOG PERFORMANCE STATUS: 1  Filed Vitals:   06/23/16 1447  BP: 136/68  Pulse: 84  Temp: 98 F (36.7 C)  Resp: 18   Filed Weights   06/23/16 1447  Weight: 188 lb 8 oz (85.503 kg)  GENERAL:alert, no distress and comfortable SKIN: skin color, texture, turgor are normal, no rashes or significant lesions EYES: normal, conjunctiva are pink and non-injected, sclera clear OROPHARYNX:no exudate, no erythema and lips, buccal mucosa, and tongue normal  NECK: supple, thyroid normal size, non-tender, without nodularity LYMPH:  no palpable lymphadenopathy in the cervical, axillary or inguinal LUNGS: clear to auscultation and percussion with normal breathing effort HEART: regular rate & rhythm and no murmurs and no lower extremity edema ABDOMEN:abdomen soft, non-tender and normal bowel sounds Musculoskeletal:no cyanosis of digits and no clubbing  PSYCH: alert & oriented x 3 with fluent speech NEURO: no focal motor/sensory deficits Breasts: Breast inspection showed them to be symmetrical with no nipple discharge. The surgical scar in right breast and right axilla are well-healed. Palpation of the both breasts and axilla revealed no obvious mass that I could appreciate.   LABORATORY DATA:  CBC Latest Ref Rng 06/23/2016 02/07/2016 11/08/2015  WBC 3.9 - 10.3 10e3/uL 7.5 7.8 7.4  Hemoglobin 11.6 - 15.9 g/dL 10.8(L) 11.2(L) 10.7(L)   Hematocrit 34.8 - 46.6 % 33.6(L) 34.9 33.4(L)  Platelets 145 - 400 10e3/uL 211 200 198    CMP Latest Ref Rng 06/23/2016 02/07/2016 11/08/2015  Glucose 70 - 140 mg/dl 115 96 100  BUN 7.0 - 26.0 mg/dL 23.8 29.9(H) 29.8(H)  Creatinine 0.6 - 1.1 mg/dL 1.0 0.9 1.1  Sodium 136 - 145 mEq/L 143 142 144  Potassium 3.5 - 5.1 mEq/L 4.4 4.5 4.0  CO2 22 - 29 mEq/L _0 Calcium 8.4 - 10.4 mg/dL 10.0 10.0 9.8  Total Protein 6.4 - 8.3 g/dL 7.6 7.7 7.5  Total Bilirubin 0.20 - 1.20 mg/dL 0.39 0.32 0.42  Alkaline Phos 40 - 150 U/L 79 83 83  AST 5 - 34 U/L _1 ALT 0 - 55 U/L _2 PATHOLOGY REPORT 1. Breast, partial mastectomy, Right 12/17/2014 - INVASIVE DUCTAL CARCINOMA, GRADE II/III, SPANNING 1.1 CM. - DUCTAL CARCINOMA IN SITU, INTERMEDIATE GRADE. - THE SURGICAL RESECTION MARGINS OF SPECIMEN #1 ARE NEGATIVE FOR CARCINOMA. - SEE ONCOLOGY TABLE BELOW. 2. Lymph node, sentinel, biopsy, right axilla - THERE IS NO EVIDENCE OF CARCINOMA IN 1 OF 1 LYMPH NODE (0/1). 1 of 3 Duplicate copy FINAL for Erica Whitney, Erica Whitney [phone]) Diagnosis(continued) 3. Breast, biopsy, additional right medial margin - INVASIVE DUCTAL CARCINOMA. - DUCTAL CARCINOMA IN SITU. - DUCTAL CARCINOMA IN SITU IS FOCALLY LESS THAN 0.1 CM TO THE MEDIAL MARGIN OF SPECIMEN #3.  Specimen, including laterality and lymph node sampling (sentinel, non-sentinel): Right breast and right axillary sentinel node. Procedure: Seed localized lumpectomy, additional medial margin resection, and sentinel lymph node resection x1. Histologic type: Ductal. Grade: II. Tubule formation: 3 Nuclear pleomorphism: 2 Mitotic: 1 Tumor size (gross measurement): 1.1 cm. Margins: Invasive, distance to closest margin: Greater than 0.2 cm to all margins. In-situ, distance to closest margin: Focally less than 0.1 cm to the medial margin of specimen #3. Lymphovascular invasion: Not identified. Ductal carcinoma in situ: Present. Grade:  Intermediate. Extensive intraductal component: Yes. Lobular neoplasia: Not identified. Tumor focality: Unifocal. Treatment effect: N/A. Extent of tumor: Confined to breast parenchyma. Lymph nodes: Examined: 1 Sentinel 0 Non-sentinel 1 Total Lymph nodes with metastasis: 0. Breast prognostic profile: [phone]. Estrogen receptor: 100%, strong staining intensity. Progesterone receptor: 6%, strong staining intensity. Her 2 neu: No amplification was detected. The ratio was 1.52. Her 2 neu by [facility] will be repeated on the current case and the results reported  separately. Ki-67: 18%. Non-neoplastic breast: Healing biopsy site. TNM: pT1c, pN0. (JBK:ds 12/21/14)  Results: HER-2/NEU BY [facility] - NO AMPLIFICATION OF HER-2 DETECTED. RESULT RATIO OF HER2: CEP 17 SIGNALS 1.11 AVERAGE HER2 COPY NUMBER PER CELL 2.10 - RADIOGRAPHIC STUDIES: I have personally reviewed the radiological images as listed and agreed with the findings in the report.  Bone density scan 02/17/2015 Impression: Osteopenia, T score at left femural neck -1.3  Mammogram diagnostic B/L  11/05/2015 IMPRESSION: Interval lumpectomy surgical changes in the lateral right breast. No mammographic evidence of malignancy in the bilateral breasts.  RECOMMENDATION: Diagnostic mammogram is suggested in 1 year. (Code:DM-B-01Y)   ASSESSMENT & PLAN:  Erica Whitney is a 75 year old female with multiple comorbidities, including hypertension, diabetes, arthritis with chronic pain, depression and insomnia, who was recently found to have 5-7 mm right breast mass at 10 o'clock position.   1. 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 -She is status post right lumpectomy and sentinel lymph node biopsy, with negative surgical margins. I reviewed her surgical pathology results with patient and her daughter.   -Adjuvant radiation was not recommended. I strongly recommended  her to consider adjuvant AI for a total of 5 years to reduce her risks of cancer recurrence.  -She is tolerating Aromasin very well, we'll continue, a total of 5 years -she is clinically doing well, her exam, today's lab are unremarkable, last mammogram in November 2016 was normal, no evidence of recurrence. -I encouraged her to be more physically active. -She'll continue taking calcium and vitamin D supplement for bone health.  -Continue breast cancer surveillance with annual mammogram, self exam, and routine follow-up with lab and exam.   2. Osteopenia -Her recent bone density scan reviewed osteopenia, T score at left femoral neck -1.3 - I encouraged her to continue calcium and vitamin D. - we discussed that Aromasin may worsen her osteopenia     3. HTN, DM, arthritis -she has moderate arthritis related pain, follows up with her primary care physician, does not want to do x-ray or see a specialist -She will continue her medication and a follow-up with her primary care physician. -She will check her blood pressure at home.  4. Mild normocytic anemia -She has been mildly anemic since last seen. Hemoglobin in the range of 11-12, 10.8 today -Iron study, J62 and folic acid was normal in November 2016 -We'll continue monitoring   PLAN: -continue Aromasin -annual diagnostic mammogram in November 2017 -RTC in 4 month for follow up with lab  All questions were answered. The patient knows to call the clinic with any problems, questions or concerns. I spent 20 minutes counseling the patient face to face. The total time spent in the appointment was 30 minutes and more than 50% was on counseling.     Truitt Merle, MD 06/23/2016 1:17 PM

## 2016-06-23 NOTE — Telephone Encounter (Signed)
per pof to sch pt appt-sent Dr Burr Medico email to put mammo order in-will call pt after reply

## 2016-10-02 ENCOUNTER — Other Ambulatory Visit: Payer: Self-pay | Admitting: Hematology

## 2016-10-02 DIAGNOSIS — Z1231 Encounter for screening mammogram for malignant neoplasm of breast: Secondary | ICD-10-CM

## 2016-10-17 ENCOUNTER — Other Ambulatory Visit: Payer: Self-pay | Admitting: Hematology

## 2016-10-17 DIAGNOSIS — C50411 Malignant neoplasm of upper-outer quadrant of right female breast: Secondary | ICD-10-CM

## 2016-11-06 ENCOUNTER — Ambulatory Visit
Admission: RE | Admit: 2016-11-06 | Discharge: 2016-11-06 | Disposition: A | Discharge status: Home / Self Care | Payer: Medicare Other | Source: Ambulatory Visit | Attending: Hematology | Admitting: Hematology

## 2016-11-06 DIAGNOSIS — Z1231 Encounter for screening mammogram for malignant neoplasm of breast: Secondary | ICD-10-CM

## 2016-11-08 NOTE — Progress Notes (Signed)
[place] FOLLOW-UP NOTE  Patient Care Team: Nolene Ebbs, MD as PCP - General (Internal Medicine) Autumn Messing III, MD as Consulting Physician (General Surgery) Truitt Merle, MD as Consulting Physician (Hematology) Thea Silversmith, MD as Consulting Physician (Radiation Oncology) Holley Bouche, NP as Nurse Practitioner (Nurse Practitioner)    Breast cancer of upper-outer quadrant of right female breast [facility])   10/26/2014 Imaging    screening mammogram showed a 62m mass in right breast at 10 o'clock position.       11/05/2014 Pathology Results    Biopsy showed grade 2 IDC, ER 90%+, PR 10%+, HER2-, with grade 1 DCIS       11/06/2014 Initial Diagnosis    Breast cancer of upper-outer quadrant of right female breast      12/17/2014 Definitive Surgery    Right lumpectomy with SLNB revealed grade 2, IDC spanning 1.1 cm; associated grade 2 DCIS. HER2 repeated and remains negative. Surgical margins clear.       12/17/2014 Pathologic Stage    pT1cpN0M0; Stage IA      01/07/2015 -  Anti-estrogen oral therapy    Patient to start Aromasin on 01/14/2015.  Planned duration of therapy is 5 years. (End date: 12/2019).      01/12/2015 Survivorship    Patient eligible for Survivorship after having completed all anti-cancer treatments (with exception of anti-estrogen) and currently NED.       CHIEF COMPLAINTS:  Follow up right breast cancer   HISTORY OF PRESENTING ILLNESS:  [name].[id number]. female is here because of recent diagnosed breast cancer.  She had routine mammogram screening on 10/26/2014, which showed 5 mm mass in the right breast 10:00 position. She denies any pain or any new symptoms. 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 Ki-67 15%. She had breast MRI on 11/24 2015, which showed a 7 mm enhancing lesion in the right breast biopsy site.  She  lives alone, able to take care of her daily needs independently, but she is not very physically active overall. She has chronic bilateral knee pain arm pain from arthritis, and she and appetite is moderate. She has insomnia and hot flashes. She denies any other new symptoms. She does not drive, her daughter lives close to her and presents to clinic with her today.   CURRENT THERAPY: Exemestane 25 mg once daily, started on 01/07/2015  INTERIM HISTORY: Erica MCockereturns for follow up.  She continues to have intermittent sharp pain in both breasts which resolves on its own. She had a mammogram last Monday and was told everything was good. She reports swelling in her right hand. She has no difficultly raising her arms. She reports hot flashes which occur mostly at night - these are manageable. She reports joint pain/stiffness in her hands especially in the morning. She has arthritis gloves.    Past Medical History:  Diagnosis Date  . Arthritis   . [place]  . Depression   . Diabetes mellitus   . Hypercholesteremia   . Hypertension   . Insomnia   . Peptic ulcer   [place] 579s . Wears glasses    Endometrial cancer over 10 years ago, s/p hyst rectomy   SURGICAL HISTORY: Past Surgical History:  Procedure Laterality Date  . ABDOMINAL HYSTERECTOMY  1995  .  BACK SURGERY  2000   lumb lam  . COLONOSCOPY    . ORIF METACARPAL FRACTURE  2012   left  . RADIOACTIVE SEED GUIDED MASTECTOMY WITH AXILLARY SENTINEL LYMPH NODE BIOPSY Right 12/17/2014   Procedure: RIGHT BREAST RADIOACTIVE SEED LOCALIZED LUMPECTOMY AND SENTINEL NODE MAPPING;  Surgeon: Autumn Messing III, MD;  Location: [facility];  Service: General;  Laterality: Right;  Lumber spine surgery 15 years   Social History   Social History  . Marital status: Single    Spouse name: N/A  . Number of children: 4  . Years of education: N/A    Social History Main Topics  . Smoking status: Former Smoker    Packs/day: 0.25    Quit date: 11/12/2011  . Smokeless tobacco: Former Systems developer    Types: Snuff  . Alcohol use No  . Drug use: No  . Sexual activity: Not Asked   Other Topics Concern  . None   Social History Narrative  . None    FAMILY HISTORY: Mother had lung cancer at age of 26 Sister had breast ca at age of 69 Brother had lung cancer at 46 No other family history of malignancy.  ALLERGIES:  has No Known Allergies.  MEDICATIONS:  Current Outpatient Prescriptions  Medication Sig Dispense Refill  . aspirin EC 81 MG tablet Take 81 mg by mouth daily.      Marland Kitchen atorvastatin (LIPITOR) 40 MG tablet Take 40 mg by mouth daily.      . Calcium Carbonate-Vitamin D (CALCIUM 600+D) 600-400 MG-UNIT per tablet Take 1 tablet by mouth daily.      . cycloSPORINE (RESTASIS) 0.05 % ophthalmic emulsion Place 1 drop into both eyes 2 (two) times daily.      Marland Kitchen dextromethorphan-guaiFENesin (MUCINEX DM) 30-600 MG per 12 hr tablet Take 1 tablet by mouth 2 (two) times daily.    Marland Kitchen exemestane (AROMASIN) 25 MG tablet TAKE 1 TABLET BY MOUTH EVERY DAY AFTER BREAKFAST 30 tablet 4  . glimepiride (AMARYL) 4 MG tablet Take 4 mg by mouth 2 (two) times daily with a meal.      . lisinopril-hydrochlorothiazide (PRINZIDE,ZESTORETIC) 10-12.5 MG per tablet Take 1 tablet by mouth daily.      . metFORMIN (GLUCOPHAGE) 1000 MG tablet As directed    . NIFEdipine (PROCARDIA XL/ADALAT-CC) 60 MG 24 hr tablet Take 60 mg by mouth daily.      . Omega-3 Fatty Acids (FISH OIL) 1000 MG CAPS Take 1 capsule by mouth daily.    . pantoprazole (PROTONIX) 40 MG tablet Take 40 mg by mouth daily.    . sertraline (ZOLOFT) 100 MG tablet Take 100 mg by mouth every morning.      . triamcinolone cream (KENALOG) 0.5 %     . vitamin C (ASCORBIC ACID) 500 MG tablet Take 500 mg by mouth daily.      Marland Kitchen zolpidem (AMBIEN) 10 MG tablet Take 5 mg by mouth at bedtime as needed. She takes four  nights a week for sleep.      No current facility-administered medications for this visit.     REVIEW OF SYSTEMS:   Constitutional: Denies fevers, chills or abnormal night sweats Eyes: Denies blurriness of vision, double vision or watery eyes Ears, nose, mouth, throat, and face: Denies mucositis or sore throat Respiratory: Denies cough, dyspnea or wheezes Cardiovascular: Denies palpitation, chest discomfort or lower extremity swelling Gastrointestinal:  Denies nausea, heartburn or change in bowel habits Skin: Denies abnormal skin rashes Lymphatics: Denies  new lymphadenopathy or easy bruising Musculoskeletal: (+) intermittent sharp breast pain Neurological:Denies numbness, tingling or new weaknesses Behavioral/Psych: Mood is stable, no new changes  All other systems were reviewed with the patient and are negative. (+) joints pain in knees and arms   PHYSICAL EXAMINATION: ECOG PERFORMANCE STATUS: 1  Vitals:   11/13/16 1318  BP: (!) 149/75  Pulse: 86  Resp: 16  Temp: 98.3 F (36.8 C)   Filed Weights   11/13/16 1318  Weight: 189 lb (85.7 kg)    GENERAL:alert, no distress and comfortable SKIN: skin color, texture, turgor are normal, no rashes or significant lesions EYES: normal, conjunctiva are pink and non-injected, sclera clear OROPHARYNX:no exudate, no erythema and lips, buccal mucosa, and tongue normal  NECK: supple, thyroid normal size, non-tender, without nodularity LYMPH:  no palpable lymphadenopathy in the cervical, axillary or inguinal LUNGS: clear to auscultation and percussion with normal breathing effort HEART: regular rate & rhythm and no murmurs and no lower extremity edema ABDOMEN:abdomen soft, non-tender and normal bowel sounds Musculoskeletal:no cyanosis of digits and no clubbing  PSYCH: alert & oriented x 3 with fluent speech NEURO: no focal motor/sensory deficits BREASTS: Breast inspection showed them to be symmetrical with no nipple discharge. The  surgical scar in right breast and right axilla are well-healed. Palpation of the both breasts and axilla revealed no obvious mass that I could appreciate.   LABORATORY DATA:  CBC Latest Ref Rng & Units 11/13/2016 06/23/2016 02/07/2016  WBC 3.9 - 10.3 10e3/uL 7.6 7.5 7.8  Hemoglobin 11.6 - 15.9 g/dL 10.9(L) 10.8(L) 11.2(L)  Hematocrit 34.8 - 46.6 % 33.7(L) 33.6(L) 34.9  Platelets 145 - 400 10e3/uL 204 211 200    CMP Latest Ref Rng & Units 11/13/2016 06/23/2016 02/07/2016  Glucose 70 - 140 mg/dl 133 115 96  BUN 7.0 - 26.0 mg/dL 23.2 23.8 29.9(H)  Creatinine 0.6 - 1.1 mg/dL 1.0 1.0 0.9  Sodium 136 - 145 mEq/L 141 143 142  Potassium 3.5 - 5.1 mEq/L 3.9 4.4 4.5  Chloride 96 - 112 mEq/L - - -  CO2 22 - 29 mEq/L _0 Calcium 8.4 - 10.4 mg/dL 9.9 10.0 10.0  Total Protein 6.4 - 8.3 g/dL 7.6 7.6 7.7  Total Bilirubin 0.20 - 1.20 mg/dL 0.50 0.39 0.32  Alkaline Phos 40 - 150 U/L 85 79 83  AST 5 - 34 U/L 39(H) 17 17  ALT 0 - 55 U/L 36 13 16      PATHOLOGY REPORT 1. Breast, partial mastectomy, Right 12/17/2014 - INVASIVE DUCTAL CARCINOMA, GRADE II/III, SPANNING 1.1 CM. - DUCTAL CARCINOMA IN SITU, INTERMEDIATE GRADE. - THE SURGICAL RESECTION MARGINS OF SPECIMEN #1 ARE NEGATIVE FOR CARCINOMA. - SEE ONCOLOGY TABLE BELOW. 2. Lymph node, sentinel, biopsy, right axilla - THERE IS NO EVIDENCE OF CARCINOMA IN 1 OF 1 LYMPH NODE (0/1). 1 of 3 Duplicate copy FINAL for Erica Whitney, Erica Whitney [phone]) Diagnosis(continued) 3. Breast, biopsy, additional right medial margin - INVASIVE DUCTAL CARCINOMA. - DUCTAL CARCINOMA IN SITU. - DUCTAL CARCINOMA IN SITU IS FOCALLY LESS THAN 0.1 CM TO THE MEDIAL MARGIN OF SPECIMEN #3.  Specimen, including laterality and lymph node sampling (sentinel, non-sentinel): Right breast and right axillary sentinel node. Procedure: Seed localized lumpectomy, additional medial margin resection, and sentinel lymph node resection x1. Histologic type: Ductal. Grade:  II. Tubule formation: 3 Nuclear pleomorphism: 2 Mitotic: 1 Tumor size (gross measurement): 1.1 cm. Margins: Invasive, distance to closest margin: Greater than 0.2 cm to all margins. In-situ, distance to  closest margin: Focally less than 0.1 cm to the medial margin of specimen #3. Lymphovascular invasion: Not identified. Ductal carcinoma in situ: Present. Grade: Intermediate. Extensive intraductal component: Yes. Lobular neoplasia: Not identified. Tumor focality: Unifocal. Treatment effect: N/A. Extent of tumor: Confined to breast parenchyma. Lymph nodes: Examined: 1 Sentinel 0 Non-sentinel 1 Total Lymph nodes with metastasis: 0. Breast prognostic profile: [phone]. Estrogen receptor: 100%, strong staining intensity. Progesterone receptor: 6%, strong staining intensity. Her 2 neu: No amplification was detected. The ratio was 1.52. Her 2 neu by [facility] will be repeated on the current case and the results reported separately. Ki-67: 18%. Non-neoplastic breast: Healing biopsy site. TNM: pT1c, pN0. (JBK:ds 12/21/14)  Results: HER-2/NEU BY [facility] - NO AMPLIFICATION OF HER-2 DETECTED. RESULT RATIO OF HER2: CEP 17 SIGNALS 1.11 AVERAGE HER2 COPY NUMBER PER CELL 2.10 - RADIOGRAPHIC STUDIES: I have personally reviewed the radiological images as listed and agreed with the findings in the report.  Bone density scan 02/17/2015 Impression: Osteopenia, T score at left femural neck -1.3  Mammogram diagnostic B/L  11/06/2016 IMPRESSION: No mammographic evidence of malignancy.  RECOMMENDATION: Bilateral diagnostic mammogram in 1 year.   ASSESSMENT & PLAN:  Erica Wahid is a 75 year old female with multiple comorbidities, including hypertension, diabetes, arthritis with chronic pain, depression and insomnia, who was recently found to have 5-7 mm right breast mass at 10 o'clock position.   1. 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 -She is status post right lumpectomy and sentinel lymph node biopsy, with negative surgical margins. I reviewed her surgical pathology results with patient and her daughter.   -Adjuvant radiation was not recommended. I strongly recommended her to consider adjuvant AI for a total of 5 years to reduce her risks of cancer recurrence.  -She is tolerating Aromasin very well, we'll continue, a total of 5 years -she is clinically doing well, her exam, today's lab are unremarkable, last mammogram in November 2017 was normal, no evidence of recurrence. -I encouraged her to be more physically active. -She'll continue taking calcium and vitamin D supplement for bone health.  -Continue breast cancer surveillance with annual mammogram, self exam, and routine follow-up with lab and exam.   2. Osteopenia -Her recent bone density scan reviewed osteopenia, T score at left femoral neck -1.3 - I encouraged her to continue calcium and vitamin D. - we discussed that Aromasin may worsen her osteopenia     3. HTN, DM, arthritis -she has moderate arthritis related pain, follows up with her primary care physician, does not want to do x-ray or see a specialist -She will continue her medication and a follow-up with her primary care physician. -She will check her blood pressure at home.  4. Mild normocytic anemia -She has been mildly anemic since last seen. Hemoglobin in the range of 11-12, 10.9 today -Iron study, H73 and folic acid was normal in November 2016 -We'll continue monitoring   PLAN: -continue Aromasin -She does not need any refills at this time -RTC in 6 months for follow up with lab  All questions were answered. The patient knows to call the clinic with any problems, questions or concerns. I spent 20 minutes counseling the patient face to face. The total time spent in the appointment was 30 minutes and more than 50% was on counseling.  This document serves as a record of  services personally performed by Truitt Merle, MD. It was created on her behalf by Arlyce Harman, a  trained medical scribe. The creation of this record is based on the scribe's personal observations and the provider's statements to them. This document has been checked and approved by the attending provider.    Truitt Merle, MD 11/13/2016 4:42 PM

## 2016-11-13 ENCOUNTER — Telehealth: Payer: Self-pay | Admitting: Hematology

## 2016-11-13 ENCOUNTER — Encounter: Payer: Self-pay | Admitting: Hematology

## 2016-11-13 ENCOUNTER — Ambulatory Visit (HOSPITAL_BASED_OUTPATIENT_CLINIC_OR_DEPARTMENT_OTHER): Payer: Medicare Other | Admitting: Hematology

## 2016-11-13 ENCOUNTER — Other Ambulatory Visit (HOSPITAL_BASED_OUTPATIENT_CLINIC_OR_DEPARTMENT_OTHER): Payer: Medicare Other

## 2016-11-13 VITALS — BP 149/75 | HR 86 | Temp 98.3°F | Resp 16 | Ht 63.0 in | Wt 189.0 lb

## 2016-11-13 DIAGNOSIS — C50411 Malignant neoplasm of upper-outer quadrant of right female breast: Secondary | ICD-10-CM

## 2016-11-13 DIAGNOSIS — Z170 Estrogen receptor positive status [ER+]: Secondary | ICD-10-CM

## 2016-11-13 DIAGNOSIS — M8588 Other specified disorders of bone density and structure, other site: Secondary | ICD-10-CM | POA: Diagnosis not present

## 2016-11-13 DIAGNOSIS — E119 Type 2 diabetes mellitus without complications: Secondary | ICD-10-CM

## 2016-11-13 DIAGNOSIS — I10 Essential (primary) hypertension: Secondary | ICD-10-CM

## 2016-11-13 LAB — COMPREHENSIVE METABOLIC PANEL
ALT: 36 U/L (ref 0–55)
ANION GAP: 10 meq/L (ref 3–11)
AST: 39 U/L — AB (ref 5–34)
Albumin: 3.6 g/dL (ref 3.5–5.0)
Alkaline Phosphatase: 85 U/L (ref 40–150)
BUN: 23.2 mg/dL (ref 7.0–26.0)
CHLORIDE: 107 meq/L (ref 98–109)
CO2: 24 meq/L (ref 22–29)
CREATININE: 1 mg/dL (ref 0.6–1.1)
Calcium: 9.9 mg/dL (ref 8.4–10.4)
EGFR: 64 mL/min/{1.73_m2} — ABNORMAL LOW (ref >90)
Glucose: 133 mg/dl (ref 70–140)
POTASSIUM: 3.9 meq/L (ref 3.5–5.1)
Sodium: 141 mEq/L (ref 136–145)
Total Bilirubin: 0.5 mg/dL (ref 0.20–1.20)
Total Protein: 7.6 g/dL (ref 6.4–8.3)

## 2016-11-13 LAB — CBC WITH DIFFERENTIAL/PLATELET
BASO%: 0.6 % (ref 0.0–2.0)
BASOS ABS: 0 10*3/uL (ref 0.0–0.1)
EOS ABS: 0.2 10*3/uL (ref 0.0–0.5)
EOS%: 2.2 % (ref 0.0–7.0)
HCT: 33.7 % — ABNORMAL LOW (ref 34.8–46.6)
HGB: 10.9 g/dL — ABNORMAL LOW (ref 11.6–15.9)
LYMPH%: 29.8 % (ref 14.0–49.7)
MCH: 29.4 pg (ref 25.1–34.0)
MCHC: 32.5 g/dL (ref 31.5–36.0)
MCV: 90.6 fL (ref 79.5–101.0)
MONO#: 0.9 10*3/uL (ref 0.1–0.9)
MONO%: 11.2 % (ref 0.0–14.0)
NEUT#: 4.3 10*3/uL (ref 1.5–6.5)
NEUT%: 56.2 % (ref 38.4–76.8)
Platelets: 204 10*3/uL (ref 145–400)
RBC: 3.71 10*6/uL (ref 3.70–5.45)
RDW: 14 % (ref 11.2–14.5)
WBC: 7.6 10*3/uL (ref 3.9–10.3)
lymph#: 2.3 10*3/uL (ref 0.9–3.3)

## 2016-11-13 NOTE — Telephone Encounter (Signed)
Appointments scheduled per 11/13/16 los. A copy of the  AVS report and appointment schedule was given to patient,per 11/13/16 los.  °

## 2017-04-03 ENCOUNTER — Other Ambulatory Visit: Payer: Self-pay | Admitting: Hematology

## 2017-04-03 DIAGNOSIS — C50411 Malignant neoplasm of upper-outer quadrant of right female breast: Secondary | ICD-10-CM

## 2017-05-11 NOTE — Progress Notes (Signed)
[facility] FOLLOW-UP NOTE  Patient Care Team: Nolene Ebbs, MD as PCP - General (Internal Medicine) Jovita Kussmaul, MD as Consulting Physician (General Surgery) Truitt Merle, MD as Consulting Physician (Hematology) Thea Silversmith, MD (Inactive) as Consulting Physician (Radiation Oncology) Holley Bouche, NP as Nurse Practitioner (Nurse Practitioner)    Breast cancer of upper-outer quadrant of right female breast [facility])   10/26/2014 Imaging    screening mammogram showed a 84m mass in right breast at 10 o'clock position.       11/05/2014 Pathology Results    Biopsy showed grade 2 IDC, ER 90%+, PR 10%+, HER2-, with grade 1 DCIS       11/06/2014 Initial Diagnosis    Breast cancer of upper-outer quadrant of right female breast      12/17/2014 Definitive Surgery    Right lumpectomy with SLNB revealed grade 2, IDC spanning 1.1 cm; associated grade 2 DCIS. HER2 repeated and remains negative. Surgical margins clear.       12/17/2014 Pathologic Stage    pT1cpN0M0; Stage IA      01/07/2015 -  Anti-estrogen oral therapy    Patient to start Aromasin on 01/14/2015.  Planned duration of therapy is 5 years. (End date: 12/2019).      01/12/2015 Survivorship    Patient eligible for Survivorship after having completed all anti-cancer treatments (with exception of anti-estrogen) and currently NED.      11/06/2016 Mammogram    IMPRESSION: No mammographic evidence of malignancy.       CHIEF COMPLAINTS:  Follow up right breast cancer   HISTORY OF PRESENTING ILLNESS:  [name]76y.o. female is here because of recent diagnosed breast cancer.  She had routine mammogram screening on 10/26/2014, which showed 5 mm mass in the right breast 10:00 position. She denies any pain or any new symptoms. 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 Ki-67 15%. She had  breast MRI on 11/24 2015, which showed a 7 mm enhancing lesion in the right breast biopsy site.  She lives alone, able to take care of her daily needs independently, but she is not very physically active overall. She has chronic bilateral knee pain arm pain from arthritis, and she and appetite is moderate. She has insomnia and hot flashes. She denies any other new symptoms. She does not drive, her daughter lives close to her and presents to clinic with her today.   CURRENT THERAPY: Exemestane 25 mg once daily, started on 01/07/2015  INTERIM HISTORY: Ms MDowerreturns for follow up.She reports thinks are going good and she bas been having bilateral ankle swelling that is sensitive to palpation. Denies any breathing issues. Her appetite and energy levels are good. She exercises regularly now.     Past Medical History:  Diagnosis Date  . Arthritis   . Breast cancer of upper-outer quadrant of right female breast ([facility] 11/06/2014  . Depression   . Diabetes mellitus   . Hypercholesteremia   . Hypertension   . Insomnia   . Peptic ulcer   . [place]    dx in her 547s . Wears glasses    Endometrial cancer over 10 years ago, s/p hyst rectomy   SURGICAL HISTORY: Past Surgical History:  Procedure Laterality Date  . ABDOMINAL HYSTERECTOMY  1995  . BACK SURGERY  2000   lumb lam  . COLONOSCOPY    . ORIF METACARPAL  FRACTURE  2012   left  . RADIOACTIVE SEED GUIDED MASTECTOMY WITH AXILLARY SENTINEL LYMPH NODE BIOPSY Right 12/17/2014   Procedure: RIGHT BREAST RADIOACTIVE SEED LOCALIZED LUMPECTOMY AND SENTINEL NODE MAPPING;  Surgeon: Autumn Messing III, MD;  Location: [facility];  Service: General;  Laterality: Right;  Lumber spine surgery 15 years   Social History   Social History  . Marital status: Single    Spouse name: N/A  . Number of children: 4  . Years of education: N/A   Social History Main Topics  . Smoking status: Former Smoker    Packs/day: 0.25     Quit date: 11/12/2011  . Smokeless tobacco: Former Systems developer    Types: Snuff  . Alcohol use No  . Drug use: No  . Sexual activity: Not on file   Other Topics Concern  . Not on file   Social History Narrative  . No narrative on file    FAMILY HISTORY: Mother had lung cancer at age of 58 Sister had breast ca at age of 22 Brother had lung cancer at 36 No other family history of malignancy.  ALLERGIES:  has No Known Allergies.  MEDICATIONS:  Current Outpatient Prescriptions  Medication Sig Dispense Refill  . aspirin EC 81 MG tablet Take 81 mg by mouth daily.      Marland Kitchen atorvastatin (LIPITOR) 40 MG tablet Take 40 mg by mouth daily.      . Calcium Carbonate-Vitamin D (CALCIUM 600+D) 600-400 MG-UNIT per tablet Take 1 tablet by mouth daily.      . cycloSPORINE (RESTASIS) 0.05 % ophthalmic emulsion Place 1 drop into both eyes 2 (two) times daily.      Marland Kitchen dextromethorphan-guaiFENesin (MUCINEX DM) 30-600 MG per 12 hr tablet Take 1 tablet by mouth 2 (two) times daily.    Marland Kitchen exemestane (AROMASIN) 25 MG tablet TAKE 1 TABLET BY MOUTH EVERY DAY AFTER BREAKFAST 30 tablet 4  . glimepiride (AMARYL) 4 MG tablet Take 4 mg by mouth 2 (two) times daily with a meal.      . lisinopril-hydrochlorothiazide (PRINZIDE,ZESTORETIC) 10-12.5 MG per tablet Take 1 tablet by mouth daily.      . metFORMIN (GLUCOPHAGE) 1000 MG tablet As directed    . NIFEdipine (PROCARDIA XL/ADALAT-CC) 60 MG 24 hr tablet Take 60 mg by mouth daily.      . Omega-3 Fatty Acids (FISH OIL) 1000 MG CAPS Take 1 capsule by mouth daily.    . pantoprazole (PROTONIX) 40 MG tablet Take 40 mg by mouth daily.    . sertraline (ZOLOFT) 100 MG tablet Take 100 mg by mouth every morning.      . triamcinolone cream (KENALOG) 0.5 %     . vitamin C (ASCORBIC ACID) 500 MG tablet Take 500 mg by mouth daily.      Marland Kitchen zolpidem (AMBIEN) 10 MG tablet Take 5 mg by mouth at bedtime as needed. She takes four nights a week for sleep.      No current facility-administered  medications for this visit.     REVIEW OF SYSTEMS:   Constitutional: Denies fevers, chills or abnormal night sweats Eyes: Denies blurriness of vision, double vision or watery eyes Ears, nose, mouth, throat, and face: Denies mucositis or sore throat Respiratory: Denies cough, dyspnea or wheezes Cardiovascular: Denies palpitation, chest discomfort or lower extremity swelling Gastrointestinal:  Denies nausea, heartburn or change in bowel habits Skin: Denies abnormal skin rashes Lymphatics: Denies new lymphadenopathy or easy bruising Musculoskeletal: (+) bilateral ankle swelling that  is sensitive to palpation. Neurological:Denies numbness, tingling or new weaknesses Behavioral/Psych: Mood is stable, no new changes  All other systems were reviewed with the patient and are negative.   PHYSICAL EXAMINATION:  ECOG PERFORMANCE STATUS: 1  Vitals:   05/15/17 1351  BP: (!) 152/73  Pulse: 75  Resp: 20  Temp: 98.1 F (36.7 C)   Filed Weights   05/15/17 1351  Weight: 192 lb 12.8 oz (87.5 kg)    GENERAL:alert, no distress and comfortable SKIN: skin color, texture, turgor are normal, no rashes or significant lesions EYES: normal, conjunctiva are pink and non-injected, sclera clear OROPHARYNX:no exudate, no erythema and lips, buccal mucosa, and tongue normal  NECK: supple, thyroid normal size, non-tender, without nodularity LYMPH:  no palpable lymphadenopathy in the cervical, axillary or inguinal LUNGS: clear to auscultation and percussion with normal breathing effort HEART: regular rate & rhythm and no murmurs and no lower extremity edema ABDOMEN:abdomen soft, non-tender and normal bowel sounds Musculoskeletal:no cyanosis of digits and no clubbing  PSYCH: alert & oriented x 3 with fluent speech NEURO: no focal motor/sensory deficits BREASTS: Breast inspection showed them to be symmetrical with no nipple discharge. The surgical scar in right breast and right axilla are well-healed.  Palpation of the both breasts and axilla revealed no obvious mass that I could appreciate.   LABORATORY DATA:  CBC Latest Ref Rng & Units 05/15/2017 11/13/2016 06/23/2016  WBC 3.9 - 10.3 10e3/uL 6.7 7.6 7.5  Hemoglobin 11.6 - 15.9 g/dL 11.0(L) 10.9(L) 10.8(L)  Hematocrit 34.8 - 46.6 % 33.5(L) 33.7(L) 33.6(L)  Platelets 145 - 400 10e3/uL 190 204 211    CMP Latest Ref Rng & Units 05/15/2017 11/13/2016 06/23/2016  Glucose 70 - 140 mg/dl 185(H) 133 115  BUN 7.0 - 26.0 mg/dL 21.3 23.2 23.8  Creatinine 0.6 - 1.1 mg/dL 1.1 1.0 1.0  Sodium 136 - 145 mEq/L 141 141 143  Potassium 3.5 - 5.1 mEq/L 3.9 3.9 4.4  Chloride 96 - 112 mEq/L - - -  CO2 22 - 29 mEq/L _0 Calcium 8.4 - 10.4 mg/dL 9.9 9.9 10.0  Total Protein 6.4 - 8.3 g/dL 7.5 7.6 7.6  Total Bilirubin 0.20 - 1.20 mg/dL 0.42 0.50 0.39  Alkaline Phos 40 - 150 U/L 80 85 79  AST 5 - 34 U/L 21 39(H) 17  ALT 0 - 55 U/L 19 36 13      PATHOLOGY REPORT 1. Breast, partial mastectomy, Right 12/17/2014 - INVASIVE DUCTAL CARCINOMA, GRADE II/III, SPANNING 1.1 CM. - DUCTAL CARCINOMA IN SITU, INTERMEDIATE GRADE. - THE SURGICAL RESECTION MARGINS OF SPECIMEN #1 ARE NEGATIVE FOR CARCINOMA. - SEE ONCOLOGY TABLE BELOW. 2. Lymph node, sentinel, biopsy, right axilla - THERE IS NO EVIDENCE OF CARCINOMA IN 1 OF 1 LYMPH NODE (0/1). 1 of 3 Duplicate copy FINAL for Cahn, TANAYSHA ALKINS [phone]) Diagnosis(continued) 3. Breast, biopsy, additional right medial margin - INVASIVE DUCTAL CARCINOMA. - DUCTAL CARCINOMA IN SITU. - DUCTAL CARCINOMA IN SITU IS FOCALLY LESS THAN 0.1 CM TO THE MEDIAL MARGIN OF SPECIMEN #3.  Specimen, including laterality and lymph node sampling (sentinel, non-sentinel): Right breast and right axillary sentinel node. Procedure: Seed localized lumpectomy, additional medial margin resection, and sentinel lymph node resection x1. Histologic type: Ductal. Grade: II. Tubule formation: 3 Nuclear pleomorphism: 2 Mitotic: 1 Tumor  size (gross measurement): 1.1 cm. Margins: Invasive, distance to closest margin: Greater than 0.2 cm to all margins. In-situ, distance to closest margin: Focally less than 0.1 cm to the medial margin  of specimen #3. Lymphovascular invasion: Not identified. Ductal carcinoma in situ: Present. Grade: Intermediate. Extensive intraductal component: Yes. Lobular neoplasia: Not identified. Tumor focality: Unifocal. Treatment effect: N/A. Extent of tumor: Confined to breast parenchyma. Lymph nodes: Examined: 1 Sentinel 0 Non-sentinel 1 Total Lymph nodes with metastasis: 0. Breast prognostic profile: [id number]. Estrogen receptor: 100%, strong staining intensity. Progesterone receptor: 6%, strong staining intensity. Her 2 neu: No amplification was detected. The ratio was 1.52. Her 2 neu by [facility] will be repeated on the current case and the results reported separately. Ki-67: 18%. Non-neoplastic breast: Healing biopsy site. TNM: pT1c, pN0. (JBK:ds 12/21/14)  Results: HER-2/NEU BY [facility] - NO AMPLIFICATION OF HER-2 DETECTED. RESULT RATIO OF HER2: CEP 17 SIGNALS 1.11 AVERAGE HER2 COPY NUMBER PER CELL 2.10 - RADIOGRAPHIC STUDIES: I have personally reviewed the radiological images as listed and agreed with the findings in the report.  Bone density scan 02/17/2015 Impression: Osteopenia, T score at left femural neck -1.3  Mammogram diagnostic B/L  11/06/2016 IMPRESSION: No mammographic evidence of malignancy.  RECOMMENDATION: Bilateral diagnostic mammogram in 1 year.   ASSESSMENT & PLAN:  Ms Sherlin is a 76 y.o. female with multiple comorbidities, including hypertension, diabetes, arthritis with chronic pain, depression and insomnia, who was recently found to have 5-7 mm right breast mass at 10 o'clock position.   1. 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 -She is status post right lumpectomy and sentinel  lymph node biopsy, with negative surgical margins. I reviewed her surgical pathology results with patient and her daughter.   -Adjuvant radiation was not recommended. I strongly recommended her to consider adjuvant AI for a total of 5 years to reduce her risks of cancer recurrence.  -She is tolerating Aromasin very well, we'll continue, a total of 5 years -she is clinically doing well, her exam, previous labs are unremarkable, last mammogram in November 2017 was normal, no evidence of recurrence. -I previously encouraged her to be more physically active. -She'll continue taking calcium and vitamin D supplement for bone health.  -Continue breast cancer surveillance with annual mammogram, self exam, and routine follow-up with lab and exam. - Labs reviewed. Weight and labs are both stable.She is clinically doing well. No signs of reoccurance  - She is tolerating Aromasin well. We will continue for total of 5 years. - mammogram due upon next visit - continue monitoring   2. Osteopenia -Her recent bone density scan reviewed osteopenia, T score at left femoral neck -1.3 - I previously encouraged her to continue calcium and vitamin D. - we discussed that Aromasin may worsen her osteopenia  - she is due for DEXA scan, I will order at [facility]   3. HTN, DM, arthritis -she has moderate arthritis related pain, follows up with her primary care physician, does not want to do x-ray or see a specialist -She will continue her medication and a follow-up with her primary care physician. -She will check her blood pressure at home. - Sugar 185 today (5/29). She will follow up with PCP  4. Mild normocytic anemia -She has been mildly anemic since last seen. Hemoglobin in the range of 11-12, 10.9 on 11/13/2016 -Iron study, R91 and folic acid was normal in November 2016 - stable now (5/29) -We'll continue monitoring   5. Ankle Swelling - I advised the patient the likely of it being a blood clot  is unlikely - I encouraged her to wear compression socks or elevate her  feet when sitting or take water pills. She does not like compression socks. - I also encouraged her to be more active and not stand around. That makes fluid go down to feet - she will follow up with PCP   PLAN: -continue Aromasin -She does not need any refills at this time -RTC in 6 months for follow up with lab. Mammogram before next visit  - DEXA scan within next month  All questions were answered. The patient knows to call the clinic with any problems, questions or concerns. I spent 20 minutes counseling the patient face to face. The total time spent in the appointment was 30 minutes and more than 50% was on counseling.  This document serves as a record of services personally performed by Truitt Merle, MD. It was created on her behalf by Brandt Loosen, a trained medical scribe. The creation of this record is based on the scribe's personal observations and the provider's statements to them. This document has been checked and approved by the attending provider.     Truitt Merle, MD 05/15/2017 2:12 PM

## 2017-05-15 ENCOUNTER — Other Ambulatory Visit (HOSPITAL_BASED_OUTPATIENT_CLINIC_OR_DEPARTMENT_OTHER): Payer: Medicare Other

## 2017-05-15 ENCOUNTER — Ambulatory Visit (HOSPITAL_BASED_OUTPATIENT_CLINIC_OR_DEPARTMENT_OTHER): Payer: Medicare Other | Admitting: Hematology

## 2017-05-15 ENCOUNTER — Telehealth: Payer: Self-pay | Admitting: Hematology

## 2017-05-15 VITALS — BP 152/73 | HR 75 | Temp 98.1°F | Resp 20 | Ht 63.0 in | Wt 192.8 lb

## 2017-05-15 DIAGNOSIS — M8588 Other specified disorders of bone density and structure, other site: Secondary | ICD-10-CM

## 2017-05-15 DIAGNOSIS — Z170 Estrogen receptor positive status [ER+]: Secondary | ICD-10-CM | POA: Diagnosis not present

## 2017-05-15 DIAGNOSIS — C50411 Malignant neoplasm of upper-outer quadrant of right female breast: Secondary | ICD-10-CM | POA: Diagnosis not present

## 2017-05-15 LAB — COMPREHENSIVE METABOLIC PANEL
ALT: 19 U/L (ref 0–55)
AST: 21 U/L (ref 5–34)
Albumin: 3.7 g/dL (ref 3.5–5.0)
Alkaline Phosphatase: 80 U/L (ref 40–150)
Anion Gap: 7 mEq/L (ref 3–11)
BILIRUBIN TOTAL: 0.42 mg/dL (ref 0.20–1.20)
BUN: 21.3 mg/dL (ref 7.0–26.0)
CHLORIDE: 106 meq/L (ref 98–109)
CO2: 28 meq/L (ref 22–29)
CREATININE: 1.1 mg/dL (ref 0.6–1.1)
Calcium: 9.9 mg/dL (ref 8.4–10.4)
EGFR: 58 mL/min/{1.73_m2} — ABNORMAL LOW (ref >90)
GLUCOSE: 185 mg/dL — AB (ref 70–140)
Potassium: 3.9 mEq/L (ref 3.5–5.1)
Sodium: 141 mEq/L (ref 136–145)
TOTAL PROTEIN: 7.5 g/dL (ref 6.4–8.3)

## 2017-05-15 LAB — CBC WITH DIFFERENTIAL/PLATELET
BASO%: 0.6 % (ref 0.0–2.0)
Basophils Absolute: 0 10*3/uL (ref 0.0–0.1)
EOS%: 2.4 % (ref 0.0–7.0)
Eosinophils Absolute: 0.2 10*3/uL (ref 0.0–0.5)
HCT: 33.5 % — ABNORMAL LOW (ref 34.8–46.6)
HGB: 11 g/dL — ABNORMAL LOW (ref 11.6–15.9)
LYMPH%: 28 % (ref 14.0–49.7)
MCH: 30 pg (ref 25.1–34.0)
MCHC: 32.9 g/dL (ref 31.5–36.0)
MCV: 91.2 fL (ref 79.5–101.0)
MONO#: 0.9 10*3/uL (ref 0.1–0.9)
MONO%: 13.3 % (ref 0.0–14.0)
NEUT%: 55.7 % (ref 38.4–76.8)
NEUTROS ABS: 3.7 10*3/uL (ref 1.5–6.5)
Platelets: 190 10*3/uL (ref 145–400)
RBC: 3.67 10*6/uL — AB (ref 3.70–5.45)
RDW: 14.1 % (ref 11.2–14.5)
WBC: 6.7 10*3/uL (ref 3.9–10.3)
lymph#: 1.9 10*3/uL (ref 0.9–3.3)

## 2017-05-15 NOTE — Telephone Encounter (Signed)
Scheduled appt per 5/29 los. Jennifer at [facility] to schedule Bone density. Gave patient AVS and calender.

## 2017-05-17 ENCOUNTER — Encounter: Payer: Self-pay | Admitting: Hematology

## 2017-05-22 ENCOUNTER — Other Ambulatory Visit: Payer: Self-pay

## 2017-10-17 ENCOUNTER — Other Ambulatory Visit: Payer: Self-pay | Admitting: Hematology

## 2017-10-17 DIAGNOSIS — C50411 Malignant neoplasm of upper-outer quadrant of right female breast: Secondary | ICD-10-CM

## 2017-11-07 ENCOUNTER — Other Ambulatory Visit: Payer: Self-pay

## 2017-11-12 ENCOUNTER — Ambulatory Visit
Admission: RE | Admit: 2017-11-12 | Discharge: 2017-11-12 | Disposition: A | Discharge status: Home / Self Care | Payer: Medicaid Other | Source: Ambulatory Visit | Attending: Hematology | Admitting: Hematology

## 2017-11-12 DIAGNOSIS — Z170 Estrogen receptor positive status [ER+]: Principal | ICD-10-CM

## 2017-11-12 DIAGNOSIS — C50411 Malignant neoplasm of upper-outer quadrant of right female breast: Secondary | ICD-10-CM

## 2017-11-15 ENCOUNTER — Telehealth: Payer: Self-pay | Admitting: Hematology

## 2017-11-15 ENCOUNTER — Other Ambulatory Visit (HOSPITAL_BASED_OUTPATIENT_CLINIC_OR_DEPARTMENT_OTHER): Payer: Medicare Other

## 2017-11-15 ENCOUNTER — Ambulatory Visit (HOSPITAL_BASED_OUTPATIENT_CLINIC_OR_DEPARTMENT_OTHER): Payer: Medicare Other | Admitting: Hematology

## 2017-11-15 ENCOUNTER — Encounter: Payer: Self-pay | Admitting: Hematology

## 2017-11-15 VITALS — BP 156/69 | HR 84 | Temp 98.8°F | Resp 18 | Ht 63.0 in | Wt 193.4 lb

## 2017-11-15 DIAGNOSIS — Z170 Estrogen receptor positive status [ER+]: Principal | ICD-10-CM

## 2017-11-15 DIAGNOSIS — C50411 Malignant neoplasm of upper-outer quadrant of right female breast: Secondary | ICD-10-CM

## 2017-11-15 DIAGNOSIS — M8588 Other specified disorders of bone density and structure, other site: Secondary | ICD-10-CM

## 2017-11-15 LAB — COMPREHENSIVE METABOLIC PANEL
ALT: 21 U/L (ref 0–55)
AST: 18 U/L (ref 5–34)
Albumin: 3.8 g/dL (ref 3.5–5.0)
Alkaline Phosphatase: 98 U/L (ref 40–150)
Anion Gap: 8 mEq/L (ref 3–11)
BUN: 29.8 mg/dL — AB (ref 7.0–26.0)
CALCIUM: 10.6 mg/dL — AB (ref 8.4–10.4)
CHLORIDE: 107 meq/L (ref 98–109)
CO2: 26 meq/L (ref 22–29)
CREATININE: 1.1 mg/dL (ref 0.6–1.1)
EGFR: 59 mL/min/{1.73_m2} — ABNORMAL LOW (ref >60)
Glucose: 176 mg/dl — ABNORMAL HIGH (ref 70–140)
POTASSIUM: 4.4 meq/L (ref 3.5–5.1)
Sodium: 141 mEq/L (ref 136–145)
Total Bilirubin: 0.47 mg/dL (ref 0.20–1.20)
Total Protein: 7.8 g/dL (ref 6.4–8.3)

## 2017-11-15 LAB — CBC WITH DIFFERENTIAL/PLATELET
BASO%: 0.8 % (ref 0.0–2.0)
BASOS ABS: 0.1 10*3/uL (ref 0.0–0.1)
EOS%: 3.1 % (ref 0.0–7.0)
Eosinophils Absolute: 0.2 10*3/uL (ref 0.0–0.5)
HCT: 33.1 % — ABNORMAL LOW (ref 34.8–46.6)
HEMOGLOBIN: 10.8 g/dL — AB (ref 11.6–15.9)
LYMPH%: 28.8 % (ref 14.0–49.7)
MCH: 29.7 pg (ref 25.1–34.0)
MCHC: 32.8 g/dL (ref 31.5–36.0)
MCV: 90.8 fL (ref 79.5–101.0)
MONO#: 0.9 10*3/uL (ref 0.1–0.9)
MONO%: 12.8 % (ref 0.0–14.0)
NEUT%: 54.5 % (ref 38.4–76.8)
NEUTROS ABS: 3.8 10*3/uL (ref 1.5–6.5)
PLATELETS: 196 10*3/uL (ref 145–400)
RBC: 3.64 10*6/uL — ABNORMAL LOW (ref 3.70–5.45)
RDW: 14.2 % (ref 11.2–14.5)
WBC: 6.9 10*3/uL (ref 3.9–10.3)
lymph#: 2 10*3/uL (ref 0.9–3.3)

## 2017-11-15 NOTE — Telephone Encounter (Signed)
Gave avs and calendar for May 2019 °

## 2017-11-15 NOTE — Progress Notes (Signed)
[place]  [phone] [phone]) [phone]  Clinic Follow up Note   Patient Care Team: Nolene Ebbs, MD as PCP - General (Internal Medicine) Jovita Kussmaul, MD as Consulting Physician (General Surgery) Truitt Merle, MD as Consulting Physician (Hematology) Thea Silversmith, MD (Inactive) as Consulting Physician (Radiation Oncology) Holley Bouche, NP as Nurse Practitioner (Nurse Practitioner) 11/15/2017  CHIEF COMPLAINT: F/u right breast cancer   SUMMARY OF ONCOLOGIC HISTORY:   Breast cancer of upper-outer quadrant of right female breast ([place])   10/26/2014 Imaging    screening mammogram showed a 73m mass in right breast at 10 o'clock position.       11/05/2014 Pathology Results    Biopsy showed grade 2 IDC, ER 90%+, PR 10%+, HER2-, with grade 1 DCIS       11/06/2014 Initial Diagnosis    Breast cancer of upper-outer quadrant of right female breast      12/17/2014 Definitive Surgery    Right lumpectomy with SLNB revealed grade 2, IDC spanning 1.1 cm; associated grade 2 DCIS. HER2 repeated and remains negative. Surgical margins clear.       12/17/2014 Pathologic Stage    pT1cpN0M0; Stage IA      01/07/2015 -  Anti-estrogen oral therapy    Patient to start Aromasin on 01/14/2015.  Planned duration of therapy is 5 years. (End date: 12/2019).      01/12/2015 Survivorship    Patient eligible for Survivorship after having completed all anti-cancer treatments (with exception of anti-estrogen) and currently NED.      11/06/2016 Mammogram    IMPRESSION: No mammographic evidence of malignancy.      11/12/2017 Mammogram    IMPRESSION: No mammographic evidence of malignancy.  RECOMMENDATION: Annual diagnostic mammography.     CURRENT THERAPY: Exemestane 25 mg once daily x5 years; started 01/07/15  INTERVAL HISTORY: Erica. MVoorhiesreturns today for follow-up as scheduled.  She had annual mammogram on 11/12/2017; denies changes or concerns in  her breast.  She reports ongoing knee pain with intermittent right lower extremity pain and swelling over the last few weeks, she wears compression socks.  Takes Tylenol as needed which he uses leg pain.  She reports having an x-ray last month which showed a "pulled nerve".  Mild dyspnea on exertion is stable, resolves quickly with rest.  Mild hot flashes and sweats are tolerable are improving overall.   REVIEW OF SYSTEMS:   Constitutional: Denies fevers, chills or abnormal weight loss (+) hot flashes, sweats improving Eyes: Denies blurriness of vision Ears, nose, mouth, throat, and face: Denies mucositis or sore throat Respiratory: Denies cough or wheezes (+) DOE, resolves quickly with rest Cardiovascular: Denies palpitation, chest discomfort (+) intermittent lower extremity swelling Gastrointestinal:  Denies nausea, vomiting, constipation, diarrhea, heartburn or change in bowel habits Skin: Denies abnormal skin rashes Lymphatics: Denies new lymphadenopathy or easy bruising Neurological:Denies numbness, tingling or new weaknesses Behavioral/Psych: Mood is stable, no new changes  MSK: bilateral knee pain, stable; intermittent right lateral upper leg/calf pain, none currently  All other systems were reviewed with the patient and are negative.  MEDICAL HISTORY:  Past Medical History:  Diagnosis Date  . Arthritis   . [place]  . Depression   . Diabetes mellitus   . Hypercholesteremia   . Hypertension   . Insomnia   . Peptic ulcer   . [place]    dx in her 561s . Wears glasses  SURGICAL HISTORY: Past Surgical History:  Procedure Laterality Date  . ABDOMINAL HYSTERECTOMY  1995  . BACK SURGERY  2000   lumb lam  . BREAST LUMPECTOMY Right 2015  . COLONOSCOPY    . ORIF METACARPAL FRACTURE  2012   left  . RADIOACTIVE SEED GUIDED PARTIAL MASTECTOMY WITH AXILLARY SENTINEL LYMPH NODE BIOPSY Right  12/17/2014   Procedure: RIGHT BREAST RADIOACTIVE SEED LOCALIZED LUMPECTOMY AND SENTINEL NODE MAPPING;  Surgeon: Autumn Messing III, MD;  Location: [facility];  Service: General;  Laterality: Right;    I have reviewed the social history and family history with the patient and they are unchanged from previous note.  ALLERGIES:  has No Known Allergies.  MEDICATIONS:  Current Outpatient Medications  Medication Sig Dispense Refill  . ACCU-CHEK GUIDE test strip     . AIMSCO INSULIN SYR ULTRA THIN 31G X 5/16" 0.3 ML MISC     . aspirin EC 81 MG tablet Take 81 mg by mouth daily.      Marland Kitchen atorvastatin (LIPITOR) 40 MG tablet Take 40 mg by mouth daily.      . Blood Glucose Calibration (ACCU-CHEK GUIDE CONTROL) LIQD     . Calcium Carbonate-Vitamin D (CALCIUM 600+D) 600-400 MG-UNIT per tablet Take 1 tablet by mouth daily.      . cycloSPORINE (RESTASIS) 0.05 % ophthalmic emulsion Place 1 drop into both eyes 2 (two) times daily.      Marland Kitchen dextromethorphan-guaiFENesin (MUCINEX DM) 30-600 MG per 12 hr tablet Take 1 tablet by mouth 2 (two) times daily.    Marland Kitchen exemestane (AROMASIN) 25 MG tablet TAKE 1 TABLET BY MOUTH EVERY DAY AFTER BREAKFAST 30 tablet 4  . glimepiride (AMARYL) 4 MG tablet Take 4 mg by mouth 2 (two) times daily with a meal.      . lisinopril-hydrochlorothiazide (PRINZIDE,ZESTORETIC) 10-12.5 MG per tablet Take 1 tablet by mouth daily.      . metFORMIN (GLUCOPHAGE) 1000 MG tablet As directed    . NIFEdipine (PROCARDIA XL/ADALAT-CC) 60 MG 24 hr tablet Take 60 mg by mouth daily.      . Omega-3 Fatty Acids (FISH OIL) 1000 MG CAPS Take 1 capsule by mouth daily.    . pantoprazole (PROTONIX) 40 MG tablet Take 40 mg by mouth daily.    . sertraline (ZOLOFT) 100 MG tablet Take 100 mg by mouth every morning.      . triamcinolone cream (KENALOG) 0.5 %     . vitamin C (ASCORBIC ACID) 500 MG tablet Take 500 mg by mouth daily.       No current facility-administered medications for this visit.      PHYSICAL EXAMINATION: ECOG PERFORMANCE STATUS: 1 - Symptomatic but completely ambulatory  Vitals:   11/15/17 0857  BP: (!) 156/69  Pulse: 84  Resp: 18  Temp: 98.8 F (37.1 C)  SpO2: 99%   Filed Weights   11/15/17 0857  Weight: 193 lb 6.4 oz (87.7 kg)    GENERAL:alert, no distress and comfortable SKIN: skin color, texture, turgor are normal, no rashes or significant lesions EYES: normal, Conjunctiva are pink and non-injected, sclera clear OROPHARYNX:no exudate, no erythema and lips, buccal mucosa, and tongue normal  NECK: supple, thyroid normal size, non-tender, without nodularity LYMPH:  no palpable cervical, supraclavicular, or axillary lymphadenopathy  LUNGS: clear to auscultation bilaterally with normal breathing effort HEART: regular rate & rhythm and no murmurs (+) trace symmetrical lower extremity edema. No calf tenderness or palpable cord  ABDOMEN:abdomen soft, non-tender and normal  bowel sounds  Musculoskeletal:no cyanosis of digits and no clubbing. No pain to palpation of right leg  NEURO: alert & oriented x 3 with fluent speech, no focal motor/sensory deficits BREASTS: breast inspection shows them to be symmetrical with no nipple discharge. Surgical scar to right breast is well healed, right axillary incision well healed with tiny sub cm round keloidal appearance to left boarder. Palpation of both breasts and axilla revealed no obvious mass that I could appreciate.   LABORATORY DATA:  I have reviewed the data as listed CBC Latest Ref Rng & Units 11/15/2017 05/15/2017 11/13/2016  WBC 3.9 - 10.3 10e3/uL 6.9 6.7 7.6  Hemoglobin 11.6 - 15.9 g/dL 10.8(L) 11.0(L) 10.9(L)  Hematocrit 34.8 - 46.6 % 33.1(L) 33.5(L) 33.7(L)  Platelets 145 - 400 10e3/uL 196 190 204     CMP Latest Ref Rng & Units 11/15/2017 05/15/2017 11/13/2016  Glucose 70 - 140 mg/dl 176(H) 185(H) 133  BUN 7.0 - 26.0 mg/dL 29.8(H) 21.3 23.2  Creatinine 0.6 - 1.1 mg/dL 1.1 1.1 1.0  Sodium 136 - 145  mEq/L 141 141 141  Potassium 3.5 - 5.1 mEq/L 4.4 3.9 3.9  Chloride 96 - 112 mEq/L - - -  CO2 22 - 29 mEq/L _0 Calcium 8.4 - 10.4 mg/dL 10.6(H) 9.9 9.9  Total Protein 6.4 - 8.3 g/dL 7.8 7.5 7.6  Total Bilirubin 0.20 - 1.20 mg/dL 0.47 0.42 0.50  Alkaline Phos 40 - 150 U/L 98 80 85  AST 5 - 34 U/L 18 21 39(H)  ALT 0 - 55 U/L 21 19 36    RADIOGRAPHIC STUDIES: I have personally reviewed the radiological images as listed and agreed with the findings in the report. No results found.   ASSESSMENT & PLAN: Erica Whitney is a 76 y.o. female with multiple comorbidities, including hypertension, diabetes, arthritis with chronic pain, depression and insomnia, who was recently found to have 5-7 mm right breast mass at 10 o'clock position.   1. 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 2. Osteopenia 3. HTN, DM, arthritis 4. Mild normocytic anemia 5. Ankle swelling 6. Right leg pain 7. Hypercalcemia  Erica Whitney appears stable today. She is tolerating Aromasin without much difficulty, mild hot flash and sweats are stable. Mammogram on 11/12/17 revealed no evidence of malignancy; postoperative changes in right breast are stable. She is doing well clinically, breast exam normal, no clinical concern for recurrence.  She will continue self exam and will report any changes. She will be due for annual diagnostic mammo 10/2018, will order at next visit; continue Aromasin daily. Return in 6 months for lab and f/u with Dr. Burr Medico.   For osteopenia she takes oral calcium + vitamin D daily. She has DEXA scheduled on 11/28/17. However, calcium is mildly elevated today, 10.6; BUN mildly elevated to 29.8; normal creatinine. I suspect mild dehydration causing these abnormalities; I encouraged her to increase water intake, she drinks mostly tea, decaf coffee and occasional pepsi. She will hold oral calcium for 1 week then resume. Will f/u on DEXA  results next month; she was osteopenic in 2016 with T score at left femoral neck -1.3.  For arthritis and right lower extremity pain/swelling, she has no obvious asymmetrical enlargement of right lower extremity, no pain on palpation. No sensory deficits. She reportedly ate a lot of salty and sweet foods over Thanksgiving, I encouraged her to modify her diet to low salt to improve swelling and BP and  low sugar/carb to improve BG (176 today). She takes tylenol PRN for arthritic pain. I could not find evidence of recent Xray as she reported. I have low suspicion for DVT. I encouraged her to continue PRN tylenol, heat, regular activity, elevation, and compression stockings. She will f/u with PCP next month.   PLAN: Continue Aromasin, refill not required today DEXA 11/28/17, will f/u on results For elevated calcium and BUN, hold calcium 600+D x1 week then resume daily; increase water intake Return in 6 months for lab and f/u with Dr. Burr Medico   All questions were answered. The patient knows to call the clinic with any problems, questions or concerns. No barriers to learning was detected.    Erica Feeling, NP 11/15/17

## 2017-11-28 ENCOUNTER — Ambulatory Visit
Admission: RE | Admit: 2017-11-28 | Discharge: 2017-11-28 | Disposition: A | Discharge status: Home / Self Care | Payer: Medicare Other | Source: Ambulatory Visit | Attending: Hematology | Admitting: Hematology

## 2017-11-28 DIAGNOSIS — M8588 Other specified disorders of bone density and structure, other site: Secondary | ICD-10-CM

## 2017-12-04 ENCOUNTER — Telehealth: Payer: Self-pay | Admitting: *Deleted

## 2017-12-04 NOTE — Telephone Encounter (Signed)
Spoke with pt and informed pt of normal DEXA result as per Dr. Ernestina Penna instructions.  Pt voiced understanding.

## 2017-12-04 NOTE — Telephone Encounter (Signed)
-----   Message from Truitt Merle, MD sent at 12/02/2017 11:27 AM EST ----- Please let pt know the normal DEXA result, thanks   Truitt Merle  12/02/2017

## 2018-04-09 ENCOUNTER — Other Ambulatory Visit: Payer: Self-pay | Admitting: Hematology

## 2018-04-09 DIAGNOSIS — C50411 Malignant neoplasm of upper-outer quadrant of right female breast: Secondary | ICD-10-CM

## 2018-05-15 ENCOUNTER — Inpatient Hospital Stay: Payer: Medicare Other

## 2018-05-15 ENCOUNTER — Telehealth: Payer: Self-pay | Admitting: Nurse Practitioner

## 2018-05-15 ENCOUNTER — Inpatient Hospital Stay: Payer: Medicare Other | Attending: Hematology | Admitting: Nurse Practitioner

## 2018-05-15 ENCOUNTER — Encounter: Payer: Self-pay | Admitting: Nurse Practitioner

## 2018-05-15 VITALS — BP 144/78 | HR 70 | Temp 97.8°F | Resp 17 | Ht 63.0 in | Wt 182.9 lb

## 2018-05-15 DIAGNOSIS — M79604 Pain in right leg: Secondary | ICD-10-CM | POA: Diagnosis not present

## 2018-05-15 DIAGNOSIS — C50411 Malignant neoplasm of upper-outer quadrant of right female breast: Secondary | ICD-10-CM | POA: Insufficient documentation

## 2018-05-15 DIAGNOSIS — E7800 Pure hypercholesterolemia, unspecified: Secondary | ICD-10-CM | POA: Diagnosis not present

## 2018-05-15 DIAGNOSIS — Z8711 Personal history of peptic ulcer disease: Secondary | ICD-10-CM | POA: Diagnosis not present

## 2018-05-15 DIAGNOSIS — M8580 Other specified disorders of bone density and structure, unspecified site: Secondary | ICD-10-CM | POA: Diagnosis not present

## 2018-05-15 DIAGNOSIS — R197 Diarrhea, unspecified: Secondary | ICD-10-CM | POA: Insufficient documentation

## 2018-05-15 DIAGNOSIS — Z794 Long term (current) use of insulin: Secondary | ICD-10-CM | POA: Diagnosis not present

## 2018-05-15 DIAGNOSIS — Z7982 Long term (current) use of aspirin: Secondary | ICD-10-CM | POA: Insufficient documentation

## 2018-05-15 DIAGNOSIS — Z79811 Long term (current) use of aromatase inhibitors: Secondary | ICD-10-CM | POA: Diagnosis not present

## 2018-05-15 DIAGNOSIS — R5383 Other fatigue: Secondary | ICD-10-CM | POA: Diagnosis not present

## 2018-05-15 DIAGNOSIS — M1990 Unspecified osteoarthritis, unspecified site: Secondary | ICD-10-CM | POA: Insufficient documentation

## 2018-05-15 DIAGNOSIS — I10 Essential (primary) hypertension: Secondary | ICD-10-CM | POA: Diagnosis not present

## 2018-05-15 DIAGNOSIS — M25471 Effusion, right ankle: Secondary | ICD-10-CM | POA: Insufficient documentation

## 2018-05-15 DIAGNOSIS — Z90710 Acquired absence of both cervix and uterus: Secondary | ICD-10-CM | POA: Diagnosis not present

## 2018-05-15 DIAGNOSIS — Z170 Estrogen receptor positive status [ER+]: Secondary | ICD-10-CM | POA: Insufficient documentation

## 2018-05-15 DIAGNOSIS — Z8542 Personal history of malignant neoplasm of other parts of uterus: Secondary | ICD-10-CM | POA: Diagnosis not present

## 2018-05-15 DIAGNOSIS — D649 Anemia, unspecified: Secondary | ICD-10-CM | POA: Insufficient documentation

## 2018-05-15 DIAGNOSIS — E119 Type 2 diabetes mellitus without complications: Secondary | ICD-10-CM | POA: Insufficient documentation

## 2018-05-15 DIAGNOSIS — Z79899 Other long term (current) drug therapy: Secondary | ICD-10-CM | POA: Insufficient documentation

## 2018-05-15 LAB — CBC WITH DIFFERENTIAL/PLATELET
Basophils Absolute: 0 10*3/uL (ref 0.0–0.1)
Basophils Relative: 0 %
EOS PCT: 2 %
Eosinophils Absolute: 0.2 10*3/uL (ref 0.0–0.5)
HCT: 34.1 % — ABNORMAL LOW (ref 34.8–46.6)
Hemoglobin: 10.7 g/dL — ABNORMAL LOW (ref 11.6–15.9)
LYMPHS ABS: 2.8 10*3/uL (ref 0.9–3.3)
LYMPHS PCT: 39 %
MCH: 29.8 pg (ref 25.1–34.0)
MCHC: 31.4 g/dL — ABNORMAL LOW (ref 31.5–36.0)
MCV: 95 fL (ref 79.5–101.0)
MONO ABS: 0.5 10*3/uL (ref 0.1–0.9)
Monocytes Relative: 7 %
Neutro Abs: 3.7 10*3/uL (ref 1.5–6.5)
Neutrophils Relative %: 52 %
PLATELETS: 187 10*3/uL (ref 145–400)
RBC: 3.59 MIL/uL — ABNORMAL LOW (ref 3.70–5.45)
RDW: 13.7 % (ref 11.2–14.5)
WBC: 7.2 10*3/uL (ref 3.9–10.3)

## 2018-05-15 LAB — COMPREHENSIVE METABOLIC PANEL
ALBUMIN: 3.9 g/dL (ref 3.5–5.0)
ALT: 11 U/L (ref 0–55)
ANION GAP: 11 (ref 3–11)
AST: 16 U/L (ref 5–34)
Alkaline Phosphatase: 77 U/L (ref 40–150)
BILIRUBIN TOTAL: 0.4 mg/dL (ref 0.2–1.2)
BUN: 34 mg/dL — ABNORMAL HIGH (ref 7–26)
CHLORIDE: 109 mmol/L (ref 98–109)
CO2: 21 mmol/L — AB (ref 22–29)
Calcium: 9.8 mg/dL (ref 8.4–10.4)
Creatinine, Ser: 1.24 mg/dL — ABNORMAL HIGH (ref 0.60–1.10)
GFR calc Af Amer: 48 mL/min — ABNORMAL LOW (ref >60)
GFR calc non Af Amer: 41 mL/min — ABNORMAL LOW (ref >60)
GLUCOSE: 145 mg/dL — AB (ref 70–140)
POTASSIUM: 4.1 mmol/L (ref 3.5–5.1)
SODIUM: 141 mmol/L (ref 136–145)
TOTAL PROTEIN: 7.7 g/dL (ref 6.4–8.3)

## 2018-05-15 NOTE — Progress Notes (Signed)
[place]  Telephone:([phone]) [phone] Fax:(336) [phone]  Clinic Follow up Note   Patient Care Team: Nolene Ebbs, MD as PCP - General (Internal Medicine) Jovita Kussmaul, MD as Consulting Physician (General Surgery) Truitt Merle, MD as Consulting Physician (Hematology) Thea Silversmith, MD as Consulting Physician (Radiation Oncology) Holley Bouche, NP as Nurse Practitioner (Nurse Practitioner) 05/15/2018  SUMMARY OF ONCOLOGIC HISTORY:   Breast cancer of upper-outer quadrant of right female breast ([place])   10/26/2014 Imaging    screening mammogram showed a 62m mass in right breast at 10 o'clock position.       11/05/2014 Pathology Results    Biopsy showed grade 2 IDC, ER 90%+, PR 10%+, HER2-, with grade 1 DCIS       11/06/2014 Initial Diagnosis    Breast cancer of upper-outer quadrant of right female breast      12/17/2014 Definitive Surgery    Right lumpectomy with SLNB revealed grade 2, IDC spanning 1.1 cm; associated grade 2 DCIS. HER2 repeated and remains negative. Surgical margins clear.       12/17/2014 Pathologic Stage    pT1cpN0M0; Stage IA      01/07/2015 -  Anti-estrogen oral therapy    Patient to start Aromasin on 01/14/2015.  Planned duration of therapy is 5 years. (End date: 12/2019).      01/12/2015 Survivorship    Patient eligible for Survivorship after having completed all anti-cancer treatments (with exception of anti-estrogen) and currently NED.      11/06/2016 Mammogram    IMPRESSION: No mammographic evidence of malignancy.      11/12/2017 Mammogram    IMPRESSION: No mammographic evidence of malignancy.  RECOMMENDATION: Annual diagnostic mammography.     CURRENT THERAPY: Exemestane 25 mg once daily x5 years; started 01/07/15   INTERVAL HISTORY: Erica. MReganreturns for 6 month follow-up as scheduled.  Denies changes in her health in the interim.  She has occasional fatigue, normal appetite. She has tried to eat better to  lose weight from her mid-section. She has 2 loose BMs per day she attributes to metformin.  Has allergy symptoms with occasional cough with clear/thin phlegm; no fever or chills, chest pain, or dyspnea.  She uses a cane or walker to help with her balance.  No recent bone/joint pain. no neuropathy.  Has occasional right breast pain at the surgical site, no new lump, nipple inversion, or discharge.  REVIEW OF SYSTEMS:   Constitutional: Denies fevers, chills (+) occasional fatigue (+) good appetite (+) intentional weight loss, 11 lbs in 6 months  Eyes: Denies blurriness of vision Ears, nose, mouth, throat, and face: Denies mucositis or sore throat Respiratory: Denies dyspnea or wheezes (+) intermittent cough with clear/thin phlegm  Cardiovascular: Denies palpitation, chest discomfort or lower extremity swelling Gastrointestinal:  Denies nausea, vomiting, constipation, diarrhea, hematochezia, heartburn or change in bowel habits Skin: Denies abnormal skin rashes Lymphatics: Denies new lymphadenopathy or easy bruising Neurological:Denies numbness, tingling or new weaknesses Behavioral/Psych: Mood is stable, no new changes  MSK: Denies pain.  (+) uses cane occasionally  Breast: (+) intermittent right breast pain (+) denies new lump, nipple discharge, or inversion.  All other systems were reviewed with the patient and are negative.  MEDICAL HISTORY:  Past Medical History:  Diagnosis Date  . Arthritis   . Breast cancer of [place] quadrant of right female breast ([place] 11/06/2014  . Depression   . Diabetes mellitus   . Hypercholesteremia   . Hypertension   . Insomnia   .  Peptic ulcer   . Uterine cancer ([place])    dx in her 12s  . Wears glasses     SURGICAL HISTORY: Past Surgical History:  Procedure Laterality Date  . ABDOMINAL HYSTERECTOMY  1995  . BACK SURGERY  2000   lumb lam  . BREAST LUMPECTOMY Right 2015  . COLONOSCOPY    . ORIF METACARPAL FRACTURE  2012   left  . RADIOACTIVE  SEED GUIDED PARTIAL MASTECTOMY WITH AXILLARY SENTINEL LYMPH NODE BIOPSY Right 12/17/2014   Procedure: RIGHT BREAST RADIOACTIVE SEED LOCALIZED LUMPECTOMY AND SENTINEL NODE MAPPING;  Surgeon: Autumn Messing III, MD;  Location: [facility];  Service: General;  Laterality: Right;    I have reviewed the social history and family history with the patient and they are unchanged from previous note.  ALLERGIES:  has No Known Allergies.  MEDICATIONS:  Current Outpatient Medications  Medication Sig Dispense Refill  . ACCU-CHEK GUIDE test strip     . AIMSCO INSULIN SYR ULTRA THIN 31G X 5/16" 0.3 ML MISC     . aspirin EC 81 MG tablet Take 81 mg by mouth daily.      Marland Kitchen atorvastatin (LIPITOR) 40 MG tablet Take 40 mg by mouth daily.      . Blood Glucose Calibration (ACCU-CHEK GUIDE CONTROL) LIQD     . Calcium Carbonate-Vitamin D (CALCIUM 600+D) 600-400 MG-UNIT per tablet Take 1 tablet by mouth daily.      . cycloSPORINE (RESTASIS) 0.05 % ophthalmic emulsion Place 1 drop into both eyes 2 (two) times daily.      Marland Kitchen dextromethorphan-guaiFENesin (MUCINEX DM) 30-600 MG per 12 hr tablet Take 1 tablet by mouth 2 (two) times daily.    Marland Kitchen exemestane (AROMASIN) 25 MG tablet TAKE 1 TABLET BY MOUTH EVERY DAY AFTER BREAKFAST 30 tablet 4  . glimepiride (AMARYL) 4 MG tablet Take 4 mg by mouth 2 (two) times daily with a meal.      . lisinopril-hydrochlorothiazide (PRINZIDE,ZESTORETIC) 10-12.5 MG per tablet Take 1 tablet by mouth daily.      . metFORMIN (GLUCOPHAGE) 1000 MG tablet As directed    . NIFEdipine (PROCARDIA XL/ADALAT-CC) 60 MG 24 hr tablet Take 60 mg by mouth daily.      . Omega-3 Fatty Acids (FISH OIL) 1000 MG CAPS Take 1 capsule by mouth daily.    . pantoprazole (PROTONIX) 40 MG tablet Take 40 mg by mouth daily.    . sertraline (ZOLOFT) 100 MG tablet Take 100 mg by mouth every morning.      . triamcinolone cream (KENALOG) 0.5 %     . vitamin C (ASCORBIC ACID) 500 MG tablet Take 500 mg by mouth daily.        No current facility-administered medications for this visit.     PHYSICAL EXAMINATION: ECOG PERFORMANCE STATUS: 1 - Symptomatic but completely ambulatory  Vitals:   05/15/18 1403  BP: (!) 144/78  Pulse: 70  Resp: 17  Temp: 97.8 F (36.6 C)  SpO2: 98%   Filed Weights   05/15/18 1403  Weight: 182 lb 14.4 oz (83 kg)    GENERAL:alert, no distress and comfortable SKIN: no rashes or significant lesions EYES: normal, Conjunctiva are pink and non-injected, sclera clear OROPHARYNX: no thrush or ulcers LYMPH:  no palpable cervical, supraclavicular, or axillary lymphadenopathy LUNGS: clear to auscultation with normal breathing effort HEART: regular rate & rhythm and no murmurs , trace lower extremity edema ABDOMEN:abdomen soft, non-tender and normal bowel sounds.  No hepatomegaly Musculoskeletal:no cyanosis of  digits and no clubbing  NEURO: alert & oriented x 3 with fluent speech Breasts: Inspection shows them to be symmetrical without nipple discharge or inversion.  Left breast and axilla are benign. (+) Right breast status post lumpectomy with sentinel lymph node biopsy, surgical incisions to right breast and axilla are well-healed, round keloidal appearance to left border of right breast scar.  No palpable mass in right breast or axilla that I could appreciate.  LABORATORY DATA:  I have reviewed the data as listed CBC Latest Ref Rng & Units 05/15/2018 11/15/2017 05/15/2017  WBC 3.9 - 10.3 K/uL 7.2 6.9 6.7  Hemoglobin 11.6 - 15.9 g/dL 10.7(L) 10.8(L) 11.0(L)  Hematocrit 34.8 - 46.6 % 34.1(L) 33.1(L) 33.5(L)  Platelets 145 - 400 K/uL 187 196 190     CMP Latest Ref Rng & Units 05/15/2018 11/15/2017 05/15/2017  Glucose 70 - 140 mg/dL 145(H) 176(H) 185(H)  BUN 7 - 26 mg/dL 34(H) 29.8(H) 21.3  Creatinine 0.60 - 1.10 mg/dL 1.24(H) 1.1 1.1  Sodium 136 - 145 mmol/L 141 141 141  Potassium 3.5 - 5.1 mmol/L 4.1 4.4 3.9  Chloride 98 - 109 mmol/L 109 - -  CO2 22 - 29 mmol/L 21(L) 26 28    Calcium 8.4 - 10.4 mg/dL 9.8 10.6(H) 9.9  Total Protein 6.4 - 8.3 g/dL 7.7 7.8 7.5  Total Bilirubin 0.2 - 1.2 mg/dL 0.4 0.47 0.42  Alkaline Phos 40 - 150 U/L 77 98 80  AST 5 - 34 U/L 16 18 21   ALT 0 - 55 U/L 11 21 19       RADIOGRAPHIC STUDIES: I have personally reviewed the radiological images as listed and agreed with the findings in the report. No results found.   ASSESSMENT & PLAN: Erica Whitney is a56 y.o.female with multiple comorbidities, including hypertension, diabetes, arthritis with chronic pain, depression and insomnia, who was found to have 5-7 mm right breast mass at 10 o'clock position.   1. 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 -Erica Whitney is clinically doing well.  She is 3.5 years from her initial diagnosis.  She continues daily Aromasin, tolerating well.  Her physical exam is unremarkable, labs reviewed. No clinical concern for recurrence.  She will continue breast cancer surveillance.  Annual mammogram due 11/06/2018, I ordered today.  She will return for lab and follow-up with Dr. Burr Medico in 6 months, few days after mammogram.  2. Osteopenia -DEXA in 11/2017 revealed normal bone density, she continues calcium and vitamin D supplement.  Encouraged her to continue light physical exercise for overall and bone health.  3. HTN, DM, arthritis, elevated creatinine  -She continues management of chronic conditions per PCP, she will f/u next month. Creatinine is mildly elevated today, BUN is further increased. I recommend she hydrate adequately and adhere to medication regimen to control BP and BG as prescribed by PCP. She agrees.   4. Mild normocytic anemia -Likely due to chronic disease, anemia is stable, 10.7 today (previously 10.8 in 10/2017).  -B12, folate, and iron studies normal in 2016.  5. Ankle swelling -Trace, stable  6. Right leg pain -She reports her bone and joint pain is overall  improved, has no specific pain concerns today  7. Hypercalcemia -Resolved  PLAN: -Continue surveillance, annual mammogram due 10/2018; ordered today -Continue Aromasin daily  -Continue routine f/u with PCP, next appointment in 1 month  -Return for lab, f/u with Dr. Burr Medico in 6 months   Orders Placed This  Encounter  Procedures  . MM DIAG BREAST TOMO BILATERAL    Standing Status:   Future    Standing Expiration Date:   05/16/2019    Order Specific Question:   Reason for Exam (SYMPTOM  OR DIAGNOSIS REQUIRED)    Answer:   h/o right breast cancer s/p lumpectomy and SLNB, on AI    Order Specific Question:   Preferred imaging location?    Answer:   [facility]   All questions were answered. The patient knows to call the clinic with any problems, questions or concerns. No barriers to learning was detected.     Alla Feeling, NP 05/15/18

## 2018-05-15 NOTE — Telephone Encounter (Signed)
Scheduled appt per 5/29 los - Gave patient AVS and calender per los.  

## 2018-09-25 ENCOUNTER — Other Ambulatory Visit: Payer: Self-pay | Admitting: Hematology

## 2018-09-25 DIAGNOSIS — C50411 Malignant neoplasm of upper-outer quadrant of right female breast: Secondary | ICD-10-CM

## 2018-11-08 ENCOUNTER — Inpatient Hospital Stay: Payer: Medicare Other

## 2018-11-08 ENCOUNTER — Telehealth: Payer: Self-pay | Admitting: Hematology

## 2018-11-08 ENCOUNTER — Encounter: Payer: Self-pay | Admitting: Hematology

## 2018-11-08 ENCOUNTER — Inpatient Hospital Stay: Payer: Medicare Other | Attending: Hematology | Admitting: Hematology

## 2018-11-08 VITALS — BP 151/74 | HR 77 | Temp 98.7°F | Resp 18 | Ht 63.0 in | Wt 173.7 lb

## 2018-11-08 DIAGNOSIS — C50411 Malignant neoplasm of upper-outer quadrant of right female breast: Secondary | ICD-10-CM | POA: Diagnosis not present

## 2018-11-08 DIAGNOSIS — Z923 Personal history of irradiation: Secondary | ICD-10-CM | POA: Diagnosis not present

## 2018-11-08 DIAGNOSIS — Z794 Long term (current) use of insulin: Secondary | ICD-10-CM | POA: Insufficient documentation

## 2018-11-08 DIAGNOSIS — M8580 Other specified disorders of bone density and structure, unspecified site: Secondary | ICD-10-CM | POA: Insufficient documentation

## 2018-11-08 DIAGNOSIS — M25562 Pain in left knee: Secondary | ICD-10-CM | POA: Insufficient documentation

## 2018-11-08 DIAGNOSIS — Z170 Estrogen receptor positive status [ER+]: Secondary | ICD-10-CM

## 2018-11-08 DIAGNOSIS — Z79811 Long term (current) use of aromatase inhibitors: Secondary | ICD-10-CM | POA: Diagnosis not present

## 2018-11-08 DIAGNOSIS — E119 Type 2 diabetes mellitus without complications: Secondary | ICD-10-CM | POA: Insufficient documentation

## 2018-11-08 DIAGNOSIS — Z8542 Personal history of malignant neoplasm of other parts of uterus: Secondary | ICD-10-CM | POA: Insufficient documentation

## 2018-11-08 DIAGNOSIS — Z8711 Personal history of peptic ulcer disease: Secondary | ICD-10-CM | POA: Insufficient documentation

## 2018-11-08 DIAGNOSIS — M1990 Unspecified osteoarthritis, unspecified site: Secondary | ICD-10-CM

## 2018-11-08 DIAGNOSIS — R2689 Other abnormalities of gait and mobility: Secondary | ICD-10-CM | POA: Diagnosis not present

## 2018-11-08 DIAGNOSIS — D649 Anemia, unspecified: Secondary | ICD-10-CM | POA: Insufficient documentation

## 2018-11-08 DIAGNOSIS — E7800 Pure hypercholesterolemia, unspecified: Secondary | ICD-10-CM | POA: Diagnosis not present

## 2018-11-08 DIAGNOSIS — Z79899 Other long term (current) drug therapy: Secondary | ICD-10-CM | POA: Diagnosis not present

## 2018-11-08 DIAGNOSIS — I10 Essential (primary) hypertension: Secondary | ICD-10-CM | POA: Diagnosis not present

## 2018-11-08 DIAGNOSIS — R634 Abnormal weight loss: Secondary | ICD-10-CM | POA: Diagnosis not present

## 2018-11-08 DIAGNOSIS — Z7982 Long term (current) use of aspirin: Secondary | ICD-10-CM

## 2018-11-08 DIAGNOSIS — M8588 Other specified disorders of bone density and structure, other site: Secondary | ICD-10-CM

## 2018-11-08 LAB — COMPREHENSIVE METABOLIC PANEL
ALBUMIN: 3.7 g/dL (ref 3.5–5.0)
ALT: 10 U/L (ref 0–44)
ANION GAP: 11 (ref 5–15)
AST: 14 U/L — ABNORMAL LOW (ref 15–41)
Alkaline Phosphatase: 82 U/L (ref 38–126)
BUN: 20 mg/dL (ref 8–23)
CO2: 25 mmol/L (ref 22–32)
Calcium: 9.4 mg/dL (ref 8.9–10.3)
Chloride: 107 mmol/L (ref 98–111)
Creatinine, Ser: 1.17 mg/dL — ABNORMAL HIGH (ref 0.44–1.00)
GFR calc Af Amer: 51 mL/min — ABNORMAL LOW (ref >60)
GFR calc non Af Amer: 44 mL/min — ABNORMAL LOW (ref >60)
GLUCOSE: 189 mg/dL — AB (ref 70–99)
POTASSIUM: 4.1 mmol/L (ref 3.5–5.1)
SODIUM: 143 mmol/L (ref 135–145)
Total Bilirubin: 0.5 mg/dL (ref 0.3–1.2)
Total Protein: 7.3 g/dL (ref 6.5–8.1)

## 2018-11-08 LAB — CBC WITH DIFFERENTIAL/PLATELET
Abs Immature Granulocytes: 0.02 10*3/uL (ref 0.00–0.07)
Basophils Absolute: 0 10*3/uL (ref 0.0–0.1)
Basophils Relative: 0 %
EOS ABS: 0.1 10*3/uL (ref 0.0–0.5)
EOS PCT: 3 %
HCT: 34.5 % — ABNORMAL LOW (ref 36.0–46.0)
Hemoglobin: 10.7 g/dL — ABNORMAL LOW (ref 12.0–15.0)
Immature Granulocytes: 0 %
Lymphocytes Relative: 33 %
Lymphs Abs: 1.9 10*3/uL (ref 0.7–4.0)
MCH: 29.9 pg (ref 26.0–34.0)
MCHC: 31 g/dL (ref 30.0–36.0)
MCV: 96.4 fL (ref 80.0–100.0)
Monocytes Absolute: 0.5 10*3/uL (ref 0.1–1.0)
Monocytes Relative: 9 %
NEUTROS PCT: 55 %
Neutro Abs: 3.1 10*3/uL (ref 1.7–7.7)
Platelets: 187 10*3/uL (ref 150–400)
RBC: 3.58 MIL/uL — AB (ref 3.87–5.11)
RDW: 13.5 % (ref 11.5–15.5)
WBC: 5.7 10*3/uL (ref 4.0–10.5)
nRBC: 0 % (ref 0.0–0.2)

## 2018-11-08 NOTE — Progress Notes (Signed)
[place]   [phone] [phone]   Clinic Follow up Note   Patient Care Team: Nolene Ebbs, MD as PCP - General (Internal Medicine) Jovita Kussmaul, MD as Consulting Physician (General Surgery) Truitt Merle, MD as Consulting Physician (Hematology) Thea Silversmith, MD as Consulting Physician (Radiation Oncology) Holley Bouche, NP (Inactive) as Nurse Practitioner (Nurse Practitioner)  Date of Service:  11/08/2018  CHIEF COMPLAINT: Follow up of right breast cancer  SUMMARY OF ONCOLOGIC HISTORY: Oncology History   Cancer Staging Breast cancer of upper-outer quadrant of right female breast [facility]) Staging form: Breast, AJCC 7th Edition - Clinical: Stage IA (T1b, N0, M0) - Unsigned - Pathologic stage from 12/17/2014: Stage IA (T1c, N0, cM0) - Unsigned       Breast cancer of upper-outer quadrant of right female breast ([place])   10/26/2014 Imaging    screening mammogram showed a 83m mass in right breast at 10 o'clock position.     11/05/2014 Pathology Results    Biopsy showed grade 2 IDC, ER 90%+, PR 10%+, HER2-, with grade 1 DCIS     11/06/2014 Initial Diagnosis    Breast cancer of upper-outer quadrant of right female breast    12/17/2014 Definitive Surgery    Right lumpectomy with SLNB revealed grade 2, IDC spanning 1.1 cm; associated grade 2 DCIS. HER2 repeated and remains negative. Surgical margins clear.     12/17/2014 Pathologic Stage    pT1cpN0M0; Stage IA    01/07/2015 -  Anti-estrogen oral therapy    Patient to start Aromasin on 01/14/2015.  Planned duration of therapy is 5 years. (End date: 12/2019).    01/12/2015 Survivorship    Patient eligible for Survivorship after having completed all anti-cancer treatments (with exception of anti-estrogen) and currently NED.    11/06/2016 Mammogram    IMPRESSION: No mammographic evidence of malignancy.    11/12/2017 Mammogram    IMPRESSION: No mammographic evidence of  malignancy.  RECOMMENDATION: Annual diagnostic mammography.      CURRENT THERAPY:  Exemestane 236mdaily since 01/14/18  INTERVAL HISTORY:  Erica BLUCHERs here for a follow up. She presents to the clinic today accompanied by her family member.  She notes she is tolerating Exemestane well. She denies hot flashes or joint pain.  She notes intermittent left anterior knee pain that will radiate down her leg. This will hurt from numbness even when sitting still and when she stands to walk this impacts her ambulation. She has been ambulating with a walker now as she fell before. This has been going on for several months. She was told to elevate her leg and was given nerve pill from another physician, but this has not impacted pain. She has been taking OTC Tylenol. She notes lumber pain occasionally which as been stable since her prior back surgery. She would like to see Dr. VaMickeal Skinneror evaluation.  She notes her BP and BG has been doing well. She also notes she lost weight and is now down 20 pounds since last year. She notes this is not intentional, but notes to eating adequately. Plan to have next mammogram next week.    REVIEW OF SYSTEMS:    Constitutional: Denies fevers, chills (+) weight loss, with adequate eating Eyes: Denies blurriness of vision Ears, nose, mouth, throat, and face: Denies mucositis or sore throat Respiratory: Denies cough, dyspnea or wheezes Cardiovascular: Denies palpitation, chest discomfort or lower extremity swelling Gastrointestinal:  Denies nausea, heartburn or change in bowel habits Skin: Denies  abnormal skin rashes MSK: (+) intermittent Left knee pain that radiates down her left leg.  Lymphatics: Denies new lymphadenopathy or easy bruising Neurological:Denies numbness, tingling or new weaknesses Behavioral/Psych: Mood is stable, no new changes  All other systems were reviewed with the patient and are negative.  MEDICAL HISTORY:  Past Medical History:   Diagnosis Date  . Arthritis   . Breast cancer of upper-outer quadrant of right female breast ([place]) 11/06/2014  . Depression   . Diabetes mellitus   . Hypercholesteremia   . Hypertension   . Insomnia   . Peptic ulcer   . Uterine cancer ([place])    dx in her 40s  . Wears glasses     SURGICAL HISTORY: Past Surgical History:  Procedure Laterality Date  . ABDOMINAL HYSTERECTOMY  1995  . BACK SURGERY  2000   lumb lam  . BREAST LUMPECTOMY Right 2015  . COLONOSCOPY    . ORIF METACARPAL FRACTURE  2012   left  . RADIOACTIVE SEED GUIDED PARTIAL MASTECTOMY WITH AXILLARY SENTINEL LYMPH NODE BIOPSY Right 12/17/2014   Procedure: RIGHT BREAST RADIOACTIVE SEED LOCALIZED LUMPECTOMY AND SENTINEL NODE MAPPING;  Surgeon: Autumn Messing III, MD;  Location: [place];  Service: General;  Laterality: Right;    I have reviewed the social history and family history with the patient and they are unchanged from previous note.  ALLERGIES:  has No Known Allergies.  MEDICATIONS:  Current Outpatient Medications  Medication Sig Dispense Refill  . ACCU-CHEK GUIDE test strip     . AIMSCO INSULIN SYR ULTRA THIN 31G X 5/16" 0.3 ML MISC     . aspirin EC 81 MG tablet Take 81 mg by mouth daily.      Marland Kitchen atorvastatin (LIPITOR) 40 MG tablet Take 40 mg by mouth daily.      . Blood Glucose Calibration (ACCU-CHEK GUIDE CONTROL) LIQD     . Calcium Carbonate-Vitamin D (CALCIUM 600+D) 600-400 MG-UNIT per tablet Take 1 tablet by mouth daily.      . cycloSPORINE (RESTASIS) 0.05 % ophthalmic emulsion Place 1 drop into both eyes 2 (two) times daily.      Marland Kitchen dextromethorphan-guaiFENesin (MUCINEX DM) 30-600 MG per 12 hr tablet Take 1 tablet by mouth 2 (two) times daily.    Marland Kitchen exemestane (AROMASIN) 25 MG tablet TAKE 1 TABLET BY MOUTH EVERY DAY AFTER BREAKFAST 30 tablet 4  . glimepiride (AMARYL) 4 MG tablet Take 4 mg by mouth 2 (two) times daily with a meal.      . lisinopril-hydrochlorothiazide (PRINZIDE,ZESTORETIC)  10-12.5 MG per tablet Take 1 tablet by mouth daily.      . metFORMIN (GLUCOPHAGE) 1000 MG tablet As directed    . NIFEdipine (PROCARDIA XL/ADALAT-CC) 60 MG 24 hr tablet Take 60 mg by mouth daily.      . Omega-3 Fatty Acids (FISH OIL) 1000 MG CAPS Take 1 capsule by mouth daily.    . pantoprazole (PROTONIX) 40 MG tablet Take 40 mg by mouth daily.    . sertraline (ZOLOFT) 100 MG tablet Take 100 mg by mouth every morning.      . triamcinolone cream (KENALOG) 0.5 %     . vitamin C (ASCORBIC ACID) 500 MG tablet Take 500 mg by mouth daily.       No current facility-administered medications for this visit.     PHYSICAL EXAMINATION: ECOG PERFORMANCE STATUS: 2 - Symptomatic, <50% confined to bed  Vitals:   11/08/18 1032  BP: (!) 151/74  Pulse: 77  Resp: 18  Temp: 98.7 F (37.1 C)  SpO2: 97%   Filed Weights   11/08/18 1032  Weight: 173 lb 11.2 oz (78.8 kg)    GENERAL:alert, no distress and comfortable SKIN: skin color, texture, turgor are normal, no rashes or significant lesions EYES: normal, Conjunctiva are pink and non-injected, sclera clear OROPHARYNX:no exudate, no erythema and lips, buccal mucosa, and tongue normal  NECK: supple, thyroid normal size, non-tender, without nodularity LYMPH:  no palpable lymphadenopathy in the cervical, axillary or inguinal LUNGS: clear to auscultation and percussion with normal breathing effort HEART: regular rate & rhythm and no murmurs and no lower extremity edema ABDOMEN:abdomen soft, non-tender and normal bowel sounds Musculoskeletal:no cyanosis of digits and no clubbing.  No edema on lower extremities, no skin change, or tenderness of the left lower extremity below knee. NEURO: alert & oriented x 3 with fluent speech, no focal motor/sensory deficits BREAST: (+) S/p right lumpectomy: surgical incision healed well. (+) Skin hyperpigmentation of skin fold under b/l breast   LABORATORY DATA:  I have reviewed the data as listed CBC Latest Ref Rng &  Units 11/08/2018 05/15/2018 11/15/2017  WBC 4.0 - 10.5 K/uL 5.7 7.2 6.9  Hemoglobin 12.0 - 15.0 g/dL 10.7(L) 10.7(L) 10.8(L)  Hematocrit 36.0 - 46.0 % 34.5(L) 34.1(L) 33.1(L)  Platelets 150 - 400 K/uL 187 187 196     CMP Latest Ref Rng & Units 11/08/2018 05/15/2018 11/15/2017  Glucose 70 - 99 mg/dL 189(H) 145(H) 176(H)  BUN 8 - 23 mg/dL 20 34(H) 29.8(H)  Creatinine 0.44 - 1.00 mg/dL 1.17(H) 1.24(H) 1.1  Sodium 135 - 145 mmol/L 143 141 141  Potassium 3.5 - 5.1 mmol/L 4.1 4.1 4.4  Chloride 98 - 111 mmol/L 107 109 -  CO2 22 - 32 mmol/L 25 21(L) 26  Calcium 8.9 - 10.3 mg/dL 9.4 9.8 10.6(H)  Total Protein 6.5 - 8.1 g/dL 7.3 7.7 7.8  Total Bilirubin 0.3 - 1.2 mg/dL 0.5 0.4 0.47  Alkaline Phos 38 - 126 U/L 82 77 98  AST 15 - 41 U/L 14(L) 16 18  ALT 0 - 44 U/L _0 RADIOGRAPHIC STUDIES: I have personally reviewed the radiological images as listed and agreed with the findings in the report. No results found.   ASSESSMENT & PLAN:  NATIYA SEELINGER is a 77 y.o. female with   1. 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 -She is s/p right lumpectomy and radiation -currently on adjuvant Exemestane since 01/07/15. Plan to complete in 12/2019. She is tolerating well overall   -She is clinically doing well except left leg pain.  Also I do not have high suspicion for bone metastasis, due to the prolonged persistent leg pain, I will get a bone scan in the next 2 to 3 weeks.  -Lab reviewed, her CBC and CMP are within normal limits. Her physical exam showed hyperpigmentation of skin fold under b/l breast indicating prior fungal infection. I recommend she use corn starch under her breast daily to prevent fungal infection.Marland Kitchen Next mammogram 11/13/18 is scheduled. -Continue Exemestane  -Follow-up in 4 months.  2. Left LE pain  -This has been intermittent and going on for several month. She ambulated with cane as the pain  inhibits her walking.  -She was given nerve pill by PCP, likely Gabapentin, but is not helping the pain.  -Will get bone scan to rule out bone metastasis. If negative, I will refer  to Dr. Mickeal Skinner for neuro consult and work up. She agreed.    3. Osteopenia -Her 11/2017 bone density scan reviewed osteopenia, T score at left femoral neck -1.3 -Continue calcium and vitamin D.  4. HTN, DM -On Glimepiride, Metformin,  -BP at151/74, Glucose at 189 today (11/08/18), overall stable    5. Mild normocytic anemia -She has had a mild anemia since 2015, hemoglobin around 11  -Iron study, folate, B12 were normal in 2016. Her mild anemia is probably related to her chronic disease  - stable, Hg at 10.7 today (11/08/18)   PLAN: -continue Exemestane  -Lab and f/u in 4 months -Bone scan in 2-3 weeks   No problem-specific Assessment & Plan notes found for this encounter.   Orders Placed This Encounter  Procedures  . NM Bone Scan Whole Body    Standing Status:   Future    Standing Expiration Date:   11/08/2019    Order Specific Question:   If indicated for the ordered procedure, I authorize the administration of a radiopharmaceutical per Radiology protocol    Answer:   Yes    Order Specific Question:   Preferred imaging location?    Answer:   [facility]    Order Specific Question:   Radiology Contrast Protocol - do NOT remove file path    Answer:   [email or web address]   All questions were answered. The patient knows to call the clinic with any problems, questions or concerns. No barriers to learning was detected. I spent 20 minutes counseling the patient face to face. The total time spent in the appointment was 30 minutes and more than 50% was on counseling and review of test results     Truitt Merle, MD 11/08/2018   I, Joslyn Devon, am acting as scribe for Truitt Merle, MD.   I have reviewed the above documentation for accuracy and completeness, and I agree  with the above.

## 2018-11-08 NOTE — Telephone Encounter (Signed)
Printed calendar and avs. °

## 2018-11-13 ENCOUNTER — Ambulatory Visit
Admission: RE | Admit: 2018-11-13 | Discharge: 2018-11-13 | Disposition: A | Discharge status: Home / Self Care | Payer: Medicare Other | Source: Ambulatory Visit | Attending: Nurse Practitioner | Admitting: Nurse Practitioner

## 2018-11-13 DIAGNOSIS — C50411 Malignant neoplasm of upper-outer quadrant of right female breast: Secondary | ICD-10-CM

## 2018-11-13 DIAGNOSIS — Z170 Estrogen receptor positive status [ER+]: Principal | ICD-10-CM

## 2018-11-22 ENCOUNTER — Encounter (HOSPITAL_COMMUNITY)
Admission: RE | Admit: 2018-11-22 | Discharge: 2018-11-22 | Disposition: A | Discharge status: Home / Self Care | Payer: Medicare Other | Source: Ambulatory Visit | Attending: Hematology | Admitting: Hematology

## 2018-11-22 DIAGNOSIS — C50411 Malignant neoplasm of upper-outer quadrant of right female breast: Secondary | ICD-10-CM | POA: Diagnosis not present

## 2018-11-22 DIAGNOSIS — Z170 Estrogen receptor positive status [ER+]: Secondary | ICD-10-CM | POA: Insufficient documentation

## 2018-11-22 MED ORDER — TECHNETIUM TC 99M MEDRONATE IV KIT
21.0000 | PACK | Freq: Once | INTRAVENOUS | Status: AC | PRN
  Administered 2018-11-22: 21 via INTRAVENOUS

## 2018-11-27 ENCOUNTER — Telehealth: Payer: Self-pay

## 2018-11-27 NOTE — Telephone Encounter (Signed)
-----   Message from Truitt Merle, MD sent at 11/27/2018  7:03 AM EST ----- Please let pt know her bone scan was negative, and refer her to Dr. Mickeal Skinner for her leg pain, thanks   Truitt Merle  11/27/2018

## 2018-11-27 NOTE — Telephone Encounter (Signed)
Left voice message for patient regarding bone scan results.  Per Dr. Burr Medico the scan was negative, we are referring her to Dr. Mickeal Skinner for leg pain.

## 2019-03-10 ENCOUNTER — Inpatient Hospital Stay: Payer: Medicare Other | Admitting: Hematology

## 2019-03-10 ENCOUNTER — Inpatient Hospital Stay: Payer: Medicare Other

## 2019-03-25 ENCOUNTER — Other Ambulatory Visit: Payer: Self-pay | Admitting: Hematology

## 2019-03-25 DIAGNOSIS — C50411 Malignant neoplasm of upper-outer quadrant of right female breast: Secondary | ICD-10-CM

## 2019-05-01 ENCOUNTER — Telehealth: Payer: Self-pay | Admitting: Hematology

## 2019-05-01 NOTE — Telephone Encounter (Signed)
Per sch msg, called patient about 5/18 appt to see if she would like to keep it as is, change to a phone visit, or postpone for 1-2 months. Patient wants to keep appt as is. No changes made

## 2019-05-02 NOTE — Progress Notes (Signed)
[place]   Telephone:([phone] [phone]   Clinic Follow up Note   Patient Care Team: Nolene Ebbs, MD as PCP - General (Internal Medicine) Jovita Kussmaul, MD as Consulting Physician (General Surgery) Truitt Merle, MD as Consulting Physician (Hematology) Thea Silversmith, MD as Consulting Physician (Radiation Oncology) Holley Bouche, NP (Inactive) as Nurse Practitioner (Nurse Practitioner)  Date of Service:  05/05/2019  CHIEF COMPLAINT: Follow up of right breast cancer  SUMMARY OF ONCOLOGIC HISTORY: Oncology History   Cancer Staging Breast cancer of upper-outer quadrant of right female breast [facility]) Staging form: Breast, AJCC 7th Edition - Clinical: Stage IA (T1b, N0, M0) - Unsigned - Pathologic stage from 12/17/2014: Stage IA (T1c, N0, cM0) - Unsigned       Breast cancer of upper-outer quadrant of right female breast ([place])   10/26/2014 Imaging    screening mammogram showed a 66m mass in right breast at 10 o'clock position.     11/05/2014 Pathology Results    Biopsy showed grade 2 IDC, ER 90%+, PR 10%+, HER2-, with grade 1 DCIS     11/06/2014 Initial Diagnosis    Breast cancer of upper-outer quadrant of right female breast    12/17/2014 Definitive Surgery    Right lumpectomy with SLNB revealed grade 2, IDC spanning 1.1 cm; associated grade 2 DCIS. HER2 repeated and remains negative. Surgical margins clear.     12/17/2014 Pathologic Stage    pT1cpN0M0; Stage IA    01/07/2015 -  Anti-estrogen oral therapy    Patient to start Aromasin on 01/14/2015.  Planned duration of therapy is 5 years. (End date: 12/2019).    01/12/2015 Survivorship    Patient eligible for Survivorship after having completed all anti-cancer treatments (with exception of anti-estrogen) and currently NED.    11/06/2016 Mammogram    IMPRESSION: No mammographic evidence of malignancy.    11/12/2017 Mammogram    IMPRESSION: No mammographic evidence of malignancy.   RECOMMENDATION: Annual diagnostic mammography.      CURRENT THERAPY:  Exemestane 253mdaily since 01/14/15  INTERVAL HISTORY:  [name] here for a follow up of right breast cancer. She presents to the clinic today by herself. She notes she is doing well. She notes she is staying at home most of the time. She notes pain in her right breast occasionally. This occurs when she reaches with her right arm. She denies any swelling.  I reviewed her medication list. She is now on allergy medication. She is still taking Zoloft, HTN and DM medication. She notes her DM at home is normal last she checked.  She notes her leg pain has improved but will return with leg swelling.     REVIEW OF SYSTEMS:   Constitutional: Denies fevers, chills or abnormal weight loss Eyes: Denies blurriness of vision Ears, nose, mouth, throat, and face: Denies mucositis or sore throat Respiratory: Denies cough, dyspnea or wheezes Cardiovascular: Denies palpitation, chest discomfort (+) intermittent lower extremity swelling Gastrointestinal:  Denies nausea, heartburn or change in bowel habits Skin: Denies abnormal skin rashes MSK: (+) Leg pain improved  Lymphatics: Denies new lymphadenopathy or easy bruising Neurological:Denies numbness, tingling or new weaknesses Behavioral/Psych: Mood is stable, no new changes  Breast: Right breast pain occasionally  All other systems were reviewed with the patient and are negative.  MEDICAL HISTORY:  Past Medical History:  Diagnosis Date  . Arthritis   . Breast cancer [place]  . Depression   .  Diabetes mellitus   . Hypercholesteremia   . Hypertension   . Insomnia   . Peptic ulcer   . Uterine cancer ([place])    dx in her 48s  . Wears glasses     SURGICAL HISTORY: Past Surgical History:  Procedure Laterality Date  . ABDOMINAL HYSTERECTOMY  1995  . BACK SURGERY  2000   lumb lam  . BREAST LUMPECTOMY  Right 2015  . COLONOSCOPY    . ORIF METACARPAL FRACTURE  2012   left  . RADIOACTIVE SEED GUIDED PARTIAL MASTECTOMY WITH AXILLARY SENTINEL LYMPH NODE BIOPSY Right 12/17/2014   Procedure: RIGHT BREAST RADIOACTIVE SEED LOCALIZED LUMPECTOMY AND SENTINEL NODE MAPPING;  Surgeon: Autumn Messing III, MD;  Location: [place];  Service: General;  Laterality: Right;    I have reviewed the social history and family history with the patient and they are unchanged from previous note.  ALLERGIES:  has No Known Allergies.  MEDICATIONS:  Current Outpatient Medications  Medication Sig Dispense Refill  . ACCU-CHEK GUIDE test strip     . AIMSCO INSULIN SYR ULTRA THIN 31G X 5/16" 0.3 ML MISC     . aspirin EC 81 MG tablet Take 81 mg by mouth daily.      Marland Kitchen atorvastatin (LIPITOR) 40 MG tablet Take 40 mg by mouth daily.      . Blood Glucose Calibration (ACCU-CHEK GUIDE CONTROL) LIQD     . Calcium Carbonate-Vitamin D (CALCIUM 600+D) 600-400 MG-UNIT per tablet Take 1 tablet by mouth daily.      . cycloSPORINE (RESTASIS) 0.05 % ophthalmic emulsion Place 1 drop into both eyes 2 (two) times daily.      Marland Kitchen dextromethorphan-guaiFENesin (MUCINEX DM) 30-600 MG per 12 hr tablet Take 1 tablet by mouth 2 (two) times daily.    Marland Kitchen exemestane (AROMASIN) 25 MG tablet TAKE 1 TABLET BY MOUTH EVERY DAY AFTER BREAKFAST 30 tablet 4  . glimepiride (AMARYL) 4 MG tablet Take 4 mg by mouth 2 (two) times daily with a meal.      . lisinopril-hydrochlorothiazide (PRINZIDE,ZESTORETIC) 10-12.5 MG per tablet Take 1 tablet by mouth daily.      . metFORMIN (GLUCOPHAGE) 1000 MG tablet As directed    . NIFEdipine (PROCARDIA XL/ADALAT-CC) 60 MG 24 hr tablet Take 60 mg by mouth daily.      . Omega-3 Fatty Acids (FISH OIL) 1000 MG CAPS Take 1 capsule by mouth daily.    . pantoprazole (PROTONIX) 40 MG tablet Take 40 mg by mouth daily.    . sertraline (ZOLOFT) 100 MG tablet Take 100 mg by mouth every morning.      . triamcinolone cream  (KENALOG) 0.5 %     . vitamin C (ASCORBIC ACID) 500 MG tablet Take 500 mg by mouth daily.       No current facility-administered medications for this visit.     PHYSICAL EXAMINATION: ECOG PERFORMANCE STATUS: 1 - Symptomatic but completely ambulatory  Vitals:   05/05/19 0831  BP: 139/65  Pulse: 84  Resp: 18  Temp: 99.1 F (37.3 C)  SpO2: 98%   Filed Weights   05/05/19 0831  Weight: 177 lb 9.6 oz (80.6 kg)    GENERAL:alert, no distress and comfortable SKIN: skin color, texture, turgor are normal, no rashes or significant lesions EYES: normal, Conjunctiva are pink and non-injected, sclera clear OROPHARYNX:no exudate, no erythema and lips, buccal mucosa, and tongue normal  NECK: supple, thyroid normal size, non-tender, without nodularity LYMPH:  no palpable lymphadenopathy in  the cervical, axillary or inguinal LUNGS: clear to auscultation and percussion with normal breathing effort HEART: regular rate & rhythm and no murmurs and no lower extremity edema ABDOMEN:abdomen soft, non-tender and normal bowel sounds Musculoskeletal:no cyanosis of digits and no clubbing  NEURO: alert & oriented x 3 with fluent speech, no focal motor/sensory deficits BREAST: S/p right breast lumpectomy: Surgical incision healed well (+) No palpable mass or adenopathy   LABORATORY DATA:  I have reviewed the data as listed CBC Latest Ref Rng & Units 05/05/2019 11/08/2018 05/15/2018  WBC 4.0 - 10.5 K/uL 7.1 5.7 7.2  Hemoglobin 12.0 - 15.0 g/dL 10.0(L) 10.7(L) 10.7(L)  Hematocrit 36.0 - 46.0 % 31.8(L) 34.5(L) 34.1(L)  Platelets 150 - 400 K/uL 177 187 187     CMP Latest Ref Rng & Units 05/05/2019 11/08/2018 05/15/2018  Glucose 70 - 99 mg/dL 229(H) 189(H) 145(H)  BUN 8 - 23 mg/dL 30(H) 20 34(H)  Creatinine 0.44 - 1.00 mg/dL 1.34(H) 1.17(H) 1.24(H)  Sodium 135 - 145 mmol/L 141 143 141  Potassium 3.5 - 5.1 mmol/L 4.4 4.1 4.1  Chloride 98 - 111 mmol/L 107 107 109  CO2 22 - 32 mmol/L 25 25 21(L)  Calcium  8.9 - 10.3 mg/dL 8.9 9.4 9.8  Total Protein 6.5 - 8.1 g/dL 7.1 7.3 7.7  Total Bilirubin 0.3 - 1.2 mg/dL 0.3 0.5 0.4  Alkaline Phos 38 - 126 U/L 84 82 77  AST 15 - 41 U/L 25 14(L) 16  ALT 0 - 44 U/L 32 10 11      RADIOGRAPHIC STUDIES: I have personally reviewed the radiological images as listed and agreed with the findings in the report. No results found.   ASSESSMENT & PLAN:  ARVETTA ARAQUE is a 78 y.o. female with   1. 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 -She was diagnosed in 10/2014. She is s/p right lumpectomy and radiation -Currently on adjuvant Exemestane since 01/07/15. Plan to complete in 12/2019. She is tolerating well overall. Will continue for at least 5 years.  -She is clinically doing well. She does occasionally have right breast pain which is likely nerve damage from her prior surgery. Her leg pain is much improved. Labs reviewed, CBC WNL except mild anemia, CMP Cr 1.34 and BG 229. Her 10/2018 mammogram and her physical exam today were unremarkable.  -Continue surveillance, next mammogram in 11/2019 -Continue Exemestane  -F/u in 6 months    2. Left LE pain, LE swelling intermittently   -This has been intermittent and going on for several month. She ambulated with cane as the pain inhibits her walking.  -She was given nerve pill by PCP, likely Gabapentin, but is not helping the pain.  -11/2018 bone scan was negative for metastasis. I previously referred to Dr. Mickeal Skinner for neuro consult and work up.  -Her leg pain has much improved lately, but is starting to return with mild swelling. She is taking fluid pill and topical ointment.  -f/u with PCP    3. Osteopenia -Her 11/2017 bone density scan reviewed osteopenia, T score at left femoral neck -1.3 -Continue calcium and vitamin D. -Next DEXA in 11/2019   4. HTN, DM -On Glimepiride, Metformin,  -BG 229 today, BP normal today(05/05/19).    5. Mild normocytic anemia, anemia of chronic disease  -She has had a mild anemia since 2015, hemoglobin around 11  -Iron study, folate, B12 were normal in 2016. Her mild anemia is probably related  to her chronic disease  -Hg at 10 today (05/05/19) -I encouraged her to take OTC prenatal vitamin.    PLAN: -She is clinically doing well  -continue Exemestane  -Lab and f/u in 6 months -Mammogram and DEXA scan in 11/2019    No problem-specific Assessment & Plan notes found for this encounter.   Orders Placed This Encounter  Procedures  . MM DIAG BREAST TOMO BILATERAL    Standing Status:   Future    Standing Expiration Date:   05/04/2020    Order Specific Question:   Reason for Exam (SYMPTOM  OR DIAGNOSIS REQUIRED)    Answer:   screening    Order Specific Question:   Preferred imaging location?    Answer:   [facility]  . DG Bone Density    Standing Status:   Future    Standing Expiration Date:   05/04/2020    Order Specific Question:   Reason for Exam (SYMPTOM  OR DIAGNOSIS REQUIRED)    Answer:   screening    Order Specific Question:   Preferred imaging location?    Answer:   [facility]   All questions were answered. The patient knows to call the clinic with any problems, questions or concerns. No barriers to learning was detected. I spent 20 minutes counseling the patient face to face. The total time spent in the appointment was 25 minutes and more than 50% was on counseling and review of test results     Truitt Merle, MD 05/05/2019   I, Joslyn Devon, am acting as scribe for Truitt Merle, MD.   I have reviewed the above documentation for accuracy and completeness, and I agree with the above.

## 2019-05-05 ENCOUNTER — Telehealth: Payer: Self-pay | Admitting: Hematology

## 2019-05-05 ENCOUNTER — Encounter: Payer: Self-pay | Admitting: Hematology

## 2019-05-05 ENCOUNTER — Inpatient Hospital Stay: Payer: Medicare Other | Attending: Hematology

## 2019-05-05 ENCOUNTER — Other Ambulatory Visit: Payer: Self-pay

## 2019-05-05 ENCOUNTER — Inpatient Hospital Stay (HOSPITAL_BASED_OUTPATIENT_CLINIC_OR_DEPARTMENT_OTHER): Payer: Medicare Other | Admitting: Hematology

## 2019-05-05 VITALS — BP 139/65 | HR 84 | Temp 99.1°F | Resp 18 | Ht 63.0 in | Wt 177.6 lb

## 2019-05-05 DIAGNOSIS — Z8542 Personal history of malignant neoplasm of other parts of uterus: Secondary | ICD-10-CM | POA: Diagnosis not present

## 2019-05-05 DIAGNOSIS — Z79811 Long term (current) use of aromatase inhibitors: Secondary | ICD-10-CM | POA: Diagnosis not present

## 2019-05-05 DIAGNOSIS — Z170 Estrogen receptor positive status [ER+]: Secondary | ICD-10-CM | POA: Diagnosis not present

## 2019-05-05 DIAGNOSIS — E7800 Pure hypercholesterolemia, unspecified: Secondary | ICD-10-CM | POA: Diagnosis not present

## 2019-05-05 DIAGNOSIS — I10 Essential (primary) hypertension: Secondary | ICD-10-CM | POA: Diagnosis not present

## 2019-05-05 DIAGNOSIS — E119 Type 2 diabetes mellitus without complications: Secondary | ICD-10-CM | POA: Insufficient documentation

## 2019-05-05 DIAGNOSIS — Z79899 Other long term (current) drug therapy: Secondary | ICD-10-CM

## 2019-05-05 DIAGNOSIS — M8588 Other specified disorders of bone density and structure, other site: Secondary | ICD-10-CM

## 2019-05-05 DIAGNOSIS — Z7982 Long term (current) use of aspirin: Secondary | ICD-10-CM | POA: Diagnosis not present

## 2019-05-05 DIAGNOSIS — Z794 Long term (current) use of insulin: Secondary | ICD-10-CM | POA: Insufficient documentation

## 2019-05-05 DIAGNOSIS — F329 Major depressive disorder, single episode, unspecified: Secondary | ICD-10-CM

## 2019-05-05 DIAGNOSIS — C50411 Malignant neoplasm of upper-outer quadrant of right female breast: Secondary | ICD-10-CM

## 2019-05-05 DIAGNOSIS — Z90710 Acquired absence of both cervix and uterus: Secondary | ICD-10-CM | POA: Insufficient documentation

## 2019-05-05 LAB — CMP (CANCER CENTER ONLY)
ALT: 32 U/L (ref 0–44)
AST: 25 U/L (ref 15–41)
Albumin: 3.4 g/dL — ABNORMAL LOW (ref 3.5–5.0)
Alkaline Phosphatase: 84 U/L (ref 38–126)
Anion gap: 9 (ref 5–15)
BUN: 30 mg/dL — ABNORMAL HIGH (ref 8–23)
CO2: 25 mmol/L (ref 22–32)
Calcium: 8.9 mg/dL (ref 8.9–10.3)
Chloride: 107 mmol/L (ref 98–111)
Creatinine: 1.34 mg/dL — ABNORMAL HIGH (ref 0.44–1.00)
GFR, Est AFR Am: 44 mL/min — ABNORMAL LOW (ref >60)
GFR, Estimated: 38 mL/min — ABNORMAL LOW (ref >60)
Glucose, Bld: 229 mg/dL — ABNORMAL HIGH (ref 70–99)
Potassium: 4.4 mmol/L (ref 3.5–5.1)
Sodium: 141 mmol/L (ref 135–145)
Total Bilirubin: 0.3 mg/dL (ref 0.3–1.2)
Total Protein: 7.1 g/dL (ref 6.5–8.1)

## 2019-05-05 LAB — CBC WITH DIFFERENTIAL/PLATELET
Abs Immature Granulocytes: 0.03 10*3/uL (ref 0.00–0.07)
Basophils Absolute: 0 10*3/uL (ref 0.0–0.1)
Basophils Relative: 0 %
Eosinophils Absolute: 0.2 10*3/uL (ref 0.0–0.5)
Eosinophils Relative: 3 %
HCT: 31.8 % — ABNORMAL LOW (ref 36.0–46.0)
Hemoglobin: 10 g/dL — ABNORMAL LOW (ref 12.0–15.0)
Immature Granulocytes: 0 %
Lymphocytes Relative: 35 %
Lymphs Abs: 2.5 10*3/uL (ref 0.7–4.0)
MCH: 30.1 pg (ref 26.0–34.0)
MCHC: 31.4 g/dL (ref 30.0–36.0)
MCV: 95.8 fL (ref 80.0–100.0)
Monocytes Absolute: 0.7 10*3/uL (ref 0.1–1.0)
Monocytes Relative: 10 %
Neutro Abs: 3.6 10*3/uL (ref 1.7–7.7)
Neutrophils Relative %: 52 %
Platelets: 177 10*3/uL (ref 150–400)
RBC: 3.32 MIL/uL — ABNORMAL LOW (ref 3.87–5.11)
RDW: 13.4 % (ref 11.5–15.5)
WBC: 7.1 10*3/uL (ref 4.0–10.5)
nRBC: 0 % (ref 0.0–0.2)

## 2019-05-05 NOTE — Telephone Encounter (Signed)
Scheduled appt per 5/18 los. ° °A calendar will be mailed out. °

## 2019-09-01 ENCOUNTER — Other Ambulatory Visit: Payer: Self-pay | Admitting: Hematology

## 2019-09-01 DIAGNOSIS — C50411 Malignant neoplasm of upper-outer quadrant of right female breast: Secondary | ICD-10-CM

## 2019-11-04 NOTE — Progress Notes (Signed)
[place]   Telephone:(336) [phone] Fax:(336) [phone]   Clinic Follow up Note   Patient Care Team: Erica Ebbs, MD as PCP - General (Internal Medicine) Erica Kussmaul, MD as Consulting Physician (General Surgery) Erica Merle, MD as Consulting Physician (Hematology) Erica Silversmith, MD as Consulting Physician (Radiation Oncology) Erica Bouche, NP (Inactive) as Nurse Practitioner (Nurse Practitioner) 11/05/2019  CHIEF COMPLAINT: F/u right breast cancer   SUMMARY OF ONCOLOGIC HISTORY: Oncology History Overview Note  Cancer Staging Breast cancer of upper-outer quadrant of right female breast [facility]) Staging form: Breast, AJCC 7th Edition - Clinical: Stage IA (T1b, N0, M0) - Unsigned - Pathologic stage from 12/17/2014: Stage IA (T1c, N0, cM0) - Unsigned     Breast cancer of upper-outer quadrant of right female breast ([place])  10/26/2014 Imaging   screening mammogram showed a 29m mass in right breast at 10 o'clock position.    11/05/2014 Pathology Results   Biopsy showed grade 2 IDC, ER 90%+, PR 10%+, HER2-, with grade 1 DCIS    11/06/2014 Initial Diagnosis   Breast cancer of upper-outer quadrant of right female breast   12/17/2014 Definitive Surgery   Right lumpectomy with SLNB revealed grade 2, IDC spanning 1.1 cm; associated grade 2 DCIS. HER2 repeated and remains negative. Surgical margins clear.    12/17/2014 Pathologic Stage   pT1cpN0M0; Stage IA   01/07/2015 -  Anti-estrogen oral therapy   Patient to start Aromasin on 01/14/2015.  Planned duration of therapy is 5 years. (End date: 12/2019).   01/12/2015 Survivorship   Patient eligible for Survivorship after having completed all anti-cancer treatments (with exception of anti-estrogen) and currently NED.   11/06/2016 Mammogram   IMPRESSION: No mammographic evidence of malignancy.   11/12/2017 Mammogram   IMPRESSION: No mammographic evidence of malignancy.  RECOMMENDATION: Annual diagnostic  mammography.   11/13/2018 Mammogram   IMPRESSION: No evidence of malignancy in either breast. RECOMMENDATION: Bilateral diagnostic mammogram in 1 year is recommended.   11/22/2018 Imaging   Bone scan:  FINDINGS: Scoliosis and mild degenerative uptake in the thoracic and lumbar spine. Increased uptake in the feet bilaterally, likely degenerative. No focal bone uptake suspicious for metastatic disease. IMPRESSION: No evidence for osseous metastatic disease. Degenerative uptake in the spine and feet.     CURRENT THERAPY: Exemestane 253mdaily since 01/14/15  INTERVAL HISTORY: Ms. McFramptoneturns for f/u as scheduled. She was last seen 05/05/19. She has been dealing with high BP issues. PCP recently started on new medication. When she checks at home BP ranges 140-160's/70-90's. She has had a few falls in the last several months going down stairs at her residence. Denies dizziness. Started using a walker which has helped. She has bilateral shoulder pain L>R, hand stiffness, and left leg pain. The left leg pain is improved. She takes tylenol, uses heat. Denies concerns in her breast such as new mass, nipple discharge or inversion. She takes multivitamin, calcium, vitamin D. She has good appetite, normal weight. No change in bowel habits. Denies n/v. Denies hot flashes. Denies GI/GYN bleeding. Denies fever, chills, cough, chest pain, or dyspnea.    MEDICAL HISTORY:  Past Medical History:  Diagnosis Date  . Arthritis   . Breast cancer of upper-outer quadrant of right female breast ([place]  . Depression   . Diabetes mellitus   . Hypercholesteremia   . Hypertension   . Insomnia   . Peptic ulcer   . [place]terine cancer ([facility]   dx in her 5018s.  Wears glasses     SURGICAL HISTORY: Past Surgical History:  Procedure Laterality Date  . ABDOMINAL HYSTERECTOMY  1995  . BACK SURGERY  2000   lumb lam  . BREAST LUMPECTOMY Right 2015  . COLONOSCOPY    . ORIF METACARPAL FRACTURE  2012    left  . RADIOACTIVE SEED GUIDED PARTIAL MASTECTOMY WITH AXILLARY SENTINEL LYMPH NODE BIOPSY Right 12/17/2014   Procedure: RIGHT BREAST RADIOACTIVE SEED LOCALIZED LUMPECTOMY AND SENTINEL NODE MAPPING;  Surgeon: Autumn Messing III, MD;  Location: [place];  Service: General;  Laterality: Right;    I have reviewed the social history and family history with the patient and they are unchanged from previous note.  ALLERGIES:  has No Known Allergies.  MEDICATIONS:  Current Outpatient Medications  Medication Sig Dispense Refill  . ACCU-CHEK GUIDE test strip     . AIMSCO INSULIN SYR ULTRA THIN 31G X 5/16" 0.3 ML MISC     . aspirin EC 81 MG tablet Take 81 mg by mouth daily.      Marland Kitchen atorvastatin (LIPITOR) 40 MG tablet Take 40 mg by mouth daily.      . Blood Glucose Calibration (ACCU-CHEK GUIDE CONTROL) LIQD     . Calcium Carbonate-Vitamin D (CALCIUM 600+D) 600-400 MG-UNIT per tablet Take 1 tablet by mouth daily.      . cycloSPORINE (RESTASIS) 0.05 % ophthalmic emulsion Place 1 drop into both eyes 2 (two) times daily.      Marland Kitchen dextromethorphan-guaiFENesin (MUCINEX DM) 30-600 MG per 12 hr tablet Take 1 tablet by mouth 2 (two) times daily.    Marland Kitchen exemestane (AROMASIN) 25 MG tablet TAKE 1 TABLET BY MOUTH EVERY DAY AFTER BREAKFAST 30 tablet 3  . glimepiride (AMARYL) 4 MG tablet Take 4 mg by mouth 2 (two) times daily with a meal.      . lisinopril-hydrochlorothiazide (PRINZIDE,ZESTORETIC) 10-12.5 MG per tablet Take 1 tablet by mouth daily.      . metFORMIN (GLUCOPHAGE) 1000 MG tablet As directed    . NIFEdipine (PROCARDIA XL/ADALAT-CC) 60 MG 24 hr tablet Take 60 mg by mouth daily.      . Omega-3 Fatty Acids (FISH OIL) 1000 MG CAPS Take 1 capsule by mouth daily.    . pantoprazole (PROTONIX) 40 MG tablet Take 40 mg by mouth daily.    . sertraline (ZOLOFT) 100 MG tablet Take 100 mg by mouth every morning.      . triamcinolone cream (KENALOG) 0.5 %     . vitamin C (ASCORBIC ACID) 500 MG tablet Take  500 mg by mouth daily.       No current facility-administered medications for this visit.     PHYSICAL EXAMINATION: ECOG PERFORMANCE STATUS: 0 - Asymptomatic  Vitals:   11/05/19 1054 11/05/19 1109  BP: (!) 195/87 (!) 191/94  Pulse:    Resp:    Temp:    SpO2:     Filed Weights   11/05/19 1053  Weight: 175 lb 9.6 oz (79.7 kg)    GENERAL:alert, no distress and comfortable SKIN: no rash  EYES:  sclera clear LYMPH:  no palpable cervical or supraclavicular lymphadenopathy  LUNGS: clear with normal breathing effort HEART: regular rate & rhythm, no lower extremity edema ABDOMEN:abdomen soft, non-tender and normal bowel sounds Musculoskeletal: limited LUE ROM NEURO: alert & oriented x 3 with fluent speech Breast exam: s/p right lumpectomy, incisions completely healed. No palpable mass in either breast or axilla that I could appreciate.   LABORATORY DATA:  I  have reviewed the data as listed CBC Latest Ref Rng & Units 11/05/2019 05/05/2019 11/08/2018  WBC 4.0 - 10.5 K/uL 6.1 7.1 5.7  Hemoglobin 12.0 - 15.0 g/dL 10.8(L) 10.0(L) 10.7(L)  Hematocrit 36.0 - 46.0 % 34.6(L) 31.8(L) 34.5(L)  Platelets 150 - 400 K/uL 180 177 187     CMP Latest Ref Rng & Units 11/05/2019 05/05/2019 11/08/2018  Glucose 70 - 99 mg/dL 245(H) 229(H) 189(H)  BUN 8 - 23 mg/dL 29(H) 30(H) 20  Creatinine 0.44 - 1.00 mg/dL 1.51(H) 1.34(H) 1.17(H)  Sodium 135 - 145 mmol/L 144 141 143  Potassium 3.5 - 5.1 mmol/L 4.0 4.4 4.1  Chloride 98 - 111 mmol/L 107 107 107  CO2 22 - 32 mmol/L 26 25 25   Calcium 8.9 - 10.3 mg/dL 8.9 8.9 9.4  Total Protein 6.5 - 8.1 g/dL 7.3 7.1 7.3  Total Bilirubin 0.3 - 1.2 mg/dL 0.4 0.3 0.5  Alkaline Phos 38 - 126 U/L 88 84 82  AST 15 - 41 U/L 25 25 14(L)  ALT 0 - 44 U/L 22 32 10      RADIOGRAPHIC STUDIES: I have personally reviewed the radiological images as listed and agreed with the findings in the report. No results found.   ASSESSMENT & PLAN: Erica Whitney is a 78 y.o.  female with   1. 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 -She was diagnosed in 10/2014. She is s/p right lumpectomy andradiation -She began adjuvantExemestane on 01/07/15. Tolerating well overall. Plan for 5 years -Ms. Zettler is clinically doing well. Breast exam is benign. CBC and CMP stable. No clinical concern for recurrence -she will complete 5 years of AI in 12/2019. She prefers to continue surveillance in our office.  -she will have annual diag mammo on 12/01/2019, then will proceed with screening mammograms after that  -she will return for lab and f/u with Dr. Burr Medico in 1 year.   2.Chronic left LE pain, arthritis -bone scan in 11/2018 was negative for osseous metastasis -her left leg pain improved over the last year  -she also has L>R shoulder pain. ROM slightly limited in LUE -controlled with tylenol and heat  -she uses walker to prevent fall  -continue f/u with PCP   3. Osteopenia -Her12/2018bone density scan reviewed osteopenia, T score at left femoral neck -1.3 -she takes prenatal vitamin and calcium, vitamin D -DEXA with mammogram on 12/01/19   4. HTN, DM -On Glimepiride, Metformin, lisinopril-HCTZ, nifedipine  -BG 245 today -BP in clinc repeatedly elevated above 190/90. She plans to go to PCP after today's visit to discuss BP and labs -Cr 1.51, worsening. She drinks enough. This is likely related to uncontrolled HTN and DM  5. Mild normocytic anemia, anemia of chronic disease  -She has had a mild anemia since 2015, hemoglobin around 11 -Iron study,folate, B12 were normal in 2016.  -likely anemia of chronic disease  -she takes prenatal vitamin, continue -HGB 10.8 today, stable    PLAN: -Labs reviewed -Continue breast cancer surveillance -Continue exemestane until 12/2019 then stop  -Mammogram and dexa in 11/2019 -Patient prefers to f/u in our office, next routine surveillance  visit in 1 year -F/u with PCP re: DM, HTN, arthritis. She plans to go today to review BP and recent labs. Results were given to her and her daughter today   All questions were answered. The patient knows to call the clinic with any problems, questions or concerns. No barriers to learning was  detected. I spent 20 minutes counseling the patient face to face. The total time spent in the appointment was 25 minutes and more than 50% was on counseling and review of test results     Alla Feeling, NP 11/05/19

## 2019-11-05 ENCOUNTER — Encounter: Payer: Self-pay | Admitting: Nurse Practitioner

## 2019-11-05 ENCOUNTER — Other Ambulatory Visit: Payer: Self-pay

## 2019-11-05 ENCOUNTER — Inpatient Hospital Stay: Payer: Medicare Other | Attending: Nurse Practitioner

## 2019-11-05 ENCOUNTER — Inpatient Hospital Stay (HOSPITAL_BASED_OUTPATIENT_CLINIC_OR_DEPARTMENT_OTHER): Payer: Medicare Other | Admitting: Nurse Practitioner

## 2019-11-05 VITALS — BP 191/94 | HR 79 | Temp 99.1°F | Resp 18 | Ht 63.0 in | Wt 175.6 lb

## 2019-11-05 DIAGNOSIS — E7800 Pure hypercholesterolemia, unspecified: Secondary | ICD-10-CM | POA: Insufficient documentation

## 2019-11-05 DIAGNOSIS — D649 Anemia, unspecified: Secondary | ICD-10-CM | POA: Diagnosis not present

## 2019-11-05 DIAGNOSIS — M25511 Pain in right shoulder: Secondary | ICD-10-CM | POA: Diagnosis not present

## 2019-11-05 DIAGNOSIS — I10 Essential (primary) hypertension: Secondary | ICD-10-CM | POA: Insufficient documentation

## 2019-11-05 DIAGNOSIS — Z79899 Other long term (current) drug therapy: Secondary | ICD-10-CM | POA: Insufficient documentation

## 2019-11-05 DIAGNOSIS — Z8711 Personal history of peptic ulcer disease: Secondary | ICD-10-CM | POA: Diagnosis not present

## 2019-11-05 DIAGNOSIS — Z79811 Long term (current) use of aromatase inhibitors: Secondary | ICD-10-CM | POA: Diagnosis not present

## 2019-11-05 DIAGNOSIS — C50411 Malignant neoplasm of upper-outer quadrant of right female breast: Secondary | ICD-10-CM

## 2019-11-05 DIAGNOSIS — M8588 Other specified disorders of bone density and structure, other site: Secondary | ICD-10-CM

## 2019-11-05 DIAGNOSIS — M1990 Unspecified osteoarthritis, unspecified site: Secondary | ICD-10-CM | POA: Insufficient documentation

## 2019-11-05 DIAGNOSIS — Z7982 Long term (current) use of aspirin: Secondary | ICD-10-CM | POA: Diagnosis not present

## 2019-11-05 DIAGNOSIS — C7951 Secondary malignant neoplasm of bone: Secondary | ICD-10-CM | POA: Insufficient documentation

## 2019-11-05 DIAGNOSIS — E119 Type 2 diabetes mellitus without complications: Secondary | ICD-10-CM | POA: Diagnosis not present

## 2019-11-05 DIAGNOSIS — M79605 Pain in left leg: Secondary | ICD-10-CM | POA: Insufficient documentation

## 2019-11-05 DIAGNOSIS — Z170 Estrogen receptor positive status [ER+]: Secondary | ICD-10-CM

## 2019-11-05 DIAGNOSIS — M25512 Pain in left shoulder: Secondary | ICD-10-CM | POA: Insufficient documentation

## 2019-11-05 DIAGNOSIS — M8580 Other specified disorders of bone density and structure, unspecified site: Secondary | ICD-10-CM | POA: Diagnosis not present

## 2019-11-05 DIAGNOSIS — Z794 Long term (current) use of insulin: Secondary | ICD-10-CM | POA: Insufficient documentation

## 2019-11-05 LAB — COMPREHENSIVE METABOLIC PANEL
ALT: 22 U/L (ref 0–44)
AST: 25 U/L (ref 15–41)
Albumin: 3.6 g/dL (ref 3.5–5.0)
Alkaline Phosphatase: 88 U/L (ref 38–126)
Anion gap: 11 (ref 5–15)
BUN: 29 mg/dL — ABNORMAL HIGH (ref 8–23)
CO2: 26 mmol/L (ref 22–32)
Calcium: 8.9 mg/dL (ref 8.9–10.3)
Chloride: 107 mmol/L (ref 98–111)
Creatinine, Ser: 1.51 mg/dL — ABNORMAL HIGH (ref 0.44–1.00)
GFR calc Af Amer: 39 mL/min — ABNORMAL LOW (ref >60)
GFR calc non Af Amer: 33 mL/min — ABNORMAL LOW (ref >60)
Glucose, Bld: 245 mg/dL — ABNORMAL HIGH (ref 70–99)
Potassium: 4 mmol/L (ref 3.5–5.1)
Sodium: 144 mmol/L (ref 135–145)
Total Bilirubin: 0.4 mg/dL (ref 0.3–1.2)
Total Protein: 7.3 g/dL (ref 6.5–8.1)

## 2019-11-05 LAB — CBC WITH DIFFERENTIAL/PLATELET
Abs Immature Granulocytes: 0.02 10*3/uL (ref 0.00–0.07)
Basophils Absolute: 0 10*3/uL (ref 0.0–0.1)
Basophils Relative: 1 %
Eosinophils Absolute: 0.2 10*3/uL (ref 0.0–0.5)
Eosinophils Relative: 3 %
HCT: 34.6 % — ABNORMAL LOW (ref 36.0–46.0)
Hemoglobin: 10.8 g/dL — ABNORMAL LOW (ref 12.0–15.0)
Immature Granulocytes: 0 %
Lymphocytes Relative: 28 %
Lymphs Abs: 1.7 10*3/uL (ref 0.7–4.0)
MCH: 30.5 pg (ref 26.0–34.0)
MCHC: 31.2 g/dL (ref 30.0–36.0)
MCV: 97.7 fL (ref 80.0–100.0)
Monocytes Absolute: 0.7 10*3/uL (ref 0.1–1.0)
Monocytes Relative: 12 %
Neutro Abs: 3.5 10*3/uL (ref 1.7–7.7)
Neutrophils Relative %: 56 %
Platelets: 180 10*3/uL (ref 150–400)
RBC: 3.54 MIL/uL — ABNORMAL LOW (ref 3.87–5.11)
RDW: 13.2 % (ref 11.5–15.5)
WBC: 6.1 10*3/uL (ref 4.0–10.5)
nRBC: 0 % (ref 0.0–0.2)

## 2019-11-06 ENCOUNTER — Telehealth: Payer: Self-pay | Admitting: Nurse Practitioner

## 2019-11-06 NOTE — Telephone Encounter (Signed)
Scheduled appt 11/18 los.  Spoke with pt and she is aware of her appt date and time.

## 2019-12-01 ENCOUNTER — Other Ambulatory Visit: Payer: Self-pay

## 2019-12-01 ENCOUNTER — Ambulatory Visit
Admission: RE | Admit: 2019-12-01 | Discharge: 2019-12-01 | Disposition: A | Discharge status: Home / Self Care | Payer: Medicare Other | Source: Ambulatory Visit | Attending: Hematology | Admitting: Hematology

## 2019-12-01 DIAGNOSIS — Z170 Estrogen receptor positive status [ER+]: Secondary | ICD-10-CM

## 2019-12-01 DIAGNOSIS — M8588 Other specified disorders of bone density and structure, other site: Secondary | ICD-10-CM

## 2019-12-01 DIAGNOSIS — C50411 Malignant neoplasm of upper-outer quadrant of right female breast: Secondary | ICD-10-CM

## 2019-12-02 ENCOUNTER — Telehealth: Payer: Self-pay

## 2019-12-02 NOTE — Telephone Encounter (Signed)
TC to patient per Dr Burr Medico to let her know her DEXA was normal from last week. Patient verbalized understanding. No further problems or concerns at this time.

## 2020-02-04 ENCOUNTER — Other Ambulatory Visit: Payer: Self-pay

## 2020-02-04 DIAGNOSIS — C50411 Malignant neoplasm of upper-outer quadrant of right female breast: Secondary | ICD-10-CM

## 2020-02-04 MED ORDER — EXEMESTANE 25 MG PO TABS: ORAL_TABLET | ORAL | 3 refills | Status: DC

## 2020-02-05 ENCOUNTER — Telehealth: Payer: Self-pay

## 2020-02-05 ENCOUNTER — Other Ambulatory Visit: Payer: Self-pay

## 2020-02-05 DIAGNOSIS — C50411 Malignant neoplasm of upper-outer quadrant of right female breast: Secondary | ICD-10-CM

## 2020-02-05 MED ORDER — EXEMESTANE 25 MG PO TABS: ORAL_TABLET | ORAL | 3 refills | Status: DC

## 2020-02-05 NOTE — Telephone Encounter (Signed)
Patient left VM stating she was no longer using the mail order pharmacy on file and needed her exemestane prescription sent to a [facility] in [place]. Called patient back to get clarification and see if she wanted mail order pharmacy removed completely. She stated she was now using [facility] on [address] [facility], and that she would no longer be utilizing the [facility] so remove it from her chart. Patient denied any other needs/concerns at this time, but knows to call our clinic should something arise.   Pharmacies updated in EMR and new script for exemestane sent to [place].

## 2020-10-25 ENCOUNTER — Other Ambulatory Visit: Payer: Self-pay | Admitting: Hematology

## 2020-10-25 DIAGNOSIS — Z1231 Encounter for screening mammogram for malignant neoplasm of breast: Secondary | ICD-10-CM

## 2020-11-04 ENCOUNTER — Inpatient Hospital Stay: Payer: Medicare Other | Attending: Internal Medicine

## 2020-11-04 ENCOUNTER — Inpatient Hospital Stay: Payer: Medicare Other | Admitting: Hematology

## 2020-11-08 ENCOUNTER — Telehealth: Payer: Self-pay

## 2020-11-08 NOTE — Telephone Encounter (Signed)
I left vm for Erica Whitney to call us to reschedule missed appt on 11/04/2020

## 2020-12-03 ENCOUNTER — Ambulatory Visit
Admission: RE | Admit: 2020-12-03 | Discharge: 2020-12-03 | Disposition: A | Discharge status: Home / Self Care | Payer: Medicare Other | Source: Ambulatory Visit | Attending: Hematology | Admitting: Hematology

## 2020-12-03 ENCOUNTER — Other Ambulatory Visit: Payer: Self-pay

## 2020-12-03 DIAGNOSIS — Z1231 Encounter for screening mammogram for malignant neoplasm of breast: Secondary | ICD-10-CM

## 2020-12-03 HISTORY — DX: Malignant neoplasm of unspecified site of unspecified female breast: C50.919

## 2020-12-09 ENCOUNTER — Other Ambulatory Visit: Payer: Self-pay | Admitting: Hematology

## 2020-12-09 DIAGNOSIS — N6489 Other specified disorders of breast: Secondary | ICD-10-CM

## 2021-01-05 ENCOUNTER — Other Ambulatory Visit: Payer: Self-pay | Admitting: Hematology

## 2021-01-05 DIAGNOSIS — R928 Other abnormal and inconclusive findings on diagnostic imaging of breast: Secondary | ICD-10-CM

## 2021-01-10 ENCOUNTER — Ambulatory Visit
Admission: RE | Admit: 2021-01-10 | Discharge: 2021-01-10 | Disposition: A | Discharge status: Home / Self Care | Payer: Medicare Other | Source: Ambulatory Visit | Attending: Hematology | Admitting: Hematology

## 2021-01-10 ENCOUNTER — Other Ambulatory Visit: Payer: Self-pay

## 2021-01-10 ENCOUNTER — Other Ambulatory Visit: Payer: Self-pay | Admitting: Hematology

## 2021-01-10 DIAGNOSIS — N63 Unspecified lump in unspecified breast: Secondary | ICD-10-CM

## 2021-01-10 DIAGNOSIS — R928 Other abnormal and inconclusive findings on diagnostic imaging of breast: Secondary | ICD-10-CM

## 2021-01-12 ENCOUNTER — Telehealth: Payer: Self-pay | Admitting: Hematology

## 2021-01-12 NOTE — Telephone Encounter (Signed)
Scheduled appt per 1/25 sch msg - pt is aware of appt date and time

## 2021-01-27 ENCOUNTER — Other Ambulatory Visit: Payer: Self-pay | Admitting: Internal Medicine

## 2021-01-28 LAB — COMPLETE METABOLIC PANEL WITH GFR
AG Ratio: 1.3 (calc) (ref 1.0–2.5)
ALT: 17 U/L (ref 6–29)
AST: 23 U/L (ref 10–35)
Albumin: 4.1 g/dL (ref 3.6–5.1)
Alkaline phosphatase (APISO): 71 U/L (ref 37–153)
BUN/Creatinine Ratio: 17 (calc) (ref 6–22)
BUN: 42 mg/dL — ABNORMAL HIGH (ref 7–25)
CO2: 26 mmol/L (ref 20–32)
Calcium: 9.5 mg/dL (ref 8.6–10.4)
Chloride: 106 mmol/L (ref 98–110)
Creat: 2.54 mg/dL — ABNORMAL HIGH (ref 0.60–0.93)
GFR, Est African American: 20 mL/min/{1.73_m2} — ABNORMAL LOW (ref >=60)
GFR, Est Non African American: 17 mL/min/{1.73_m2} — ABNORMAL LOW (ref >=60)
Globulin: 3.1 g/dL (calc) (ref 1.9–3.7)
Glucose, Bld: 125 mg/dL — ABNORMAL HIGH (ref 65–99)
Potassium: 4.5 mmol/L (ref 3.5–5.3)
Sodium: 143 mmol/L (ref 135–146)
Total Bilirubin: 0.4 mg/dL (ref 0.2–1.2)
Total Protein: 7.2 g/dL (ref 6.1–8.1)

## 2021-01-28 LAB — URIC ACID: Uric Acid, Serum: 8 mg/dL — ABNORMAL HIGH (ref 2.5–7.0)

## 2021-01-28 LAB — LIPID PANEL
Cholesterol: 128 mg/dL (ref <200)
HDL: 44 mg/dL — ABNORMAL LOW (ref >=50)
LDL Cholesterol (Calc): 66 mg/dL
Non-HDL Cholesterol (Calc): 84 mg/dL (ref <130)
Total CHOL/HDL Ratio: 2.9 (calc) (ref <5.0)
Triglycerides: 98 mg/dL (ref <150)

## 2021-01-28 LAB — CBC
HCT: 28.8 % — ABNORMAL LOW (ref 35.0–45.0)
Hemoglobin: 9.3 g/dL — ABNORMAL LOW (ref 11.7–15.5)
MCH: 30.2 pg (ref 27.0–33.0)
MCHC: 32.3 g/dL (ref 32.0–36.0)
MCV: 93.5 fL (ref 80.0–100.0)
MPV: 10.9 fL (ref 7.5–12.5)
Platelets: 185 10*3/uL (ref 140–400)
RBC: 3.08 10*6/uL — ABNORMAL LOW (ref 3.80–5.10)
RDW: 13.2 % (ref 11.0–15.0)
WBC: 7.1 10*3/uL (ref 3.8–10.8)

## 2021-01-28 LAB — TSH: TSH: 0.63 mIU/L (ref 0.40–4.50)

## 2021-01-31 NOTE — Progress Notes (Signed)
[place]   Telephone:(336) [phone] Fax:(336) [phone]   Clinic Follow up Note   Patient Care Team: Nolene Ebbs, MD as PCP - General (Internal Medicine) Jovita Kussmaul, MD as Consulting Physician (General Surgery) Truitt Merle, MD as Consulting Physician (Hematology) Thea Silversmith, MD as Consulting Physician (Radiation Oncology) Holley Bouche, NP (Inactive) as Nurse Practitioner (Nurse Practitioner)  Date of Service:  02/03/2021  CHIEF COMPLAINT: Follow up of right breast cancer  SUMMARY OF ONCOLOGIC HISTORY: Oncology History Overview Note  Cancer Staging Breast cancer of upper-outer quadrant of right female breast [facility]) Staging form: Breast, AJCC 7th Edition - Clinical: Stage IA (T1b, N0, M0) - Unsigned - Pathologic stage from 12/17/2014: Stage IA (T1c, N0, cM0) - Unsigned     Breast cancer of upper-outer quadrant of right female breast ([place])  10/26/2014 Imaging   screening mammogram showed a 59m mass in right breast at 10 o'clock position.    11/05/2014 Pathology Results   Biopsy showed grade 2 IDC, ER 90%+, PR 10%+, HER2-, with grade 1 DCIS    11/06/2014 Initial Diagnosis   Breast cancer of upper-outer quadrant of right female breast   12/17/2014 Definitive Surgery   Right lumpectomy with SLNB revealed grade 2, IDC spanning 1.1 cm; associated grade 2 DCIS. HER2 repeated and remains negative. Surgical margins clear.    12/17/2014 Pathologic Stage   pT1cpN0M0; Stage IA   01/07/2015 - 12/2019 Anti-estrogen oral therapy   Exemestane 245mdaily since 01/14/15-12/2019   01/12/2015 Survivorship   Patient eligible for Survivorship after having completed all anti-cancer treatments (with exception of anti-estrogen) and currently NED.   11/06/2016 Mammogram   IMPRESSION: No mammographic evidence of malignancy.   11/12/2017 Mammogram   IMPRESSION: No mammographic evidence of malignancy.  RECOMMENDATION: Annual diagnostic mammography.   11/13/2018  Mammogram   IMPRESSION: No evidence of malignancy in either breast. RECOMMENDATION: Bilateral diagnostic mammogram in 1 year is recommended.   11/22/2018 Imaging   Bone scan:  FINDINGS: Scoliosis and mild degenerative uptake in the thoracic and lumbar spine. Increased uptake in the feet bilaterally, likely degenerative. No focal bone uptake suspicious for metastatic disease. IMPRESSION: No evidence for osseous metastatic disease. Degenerative uptake in the spine and feet.      CURRENT THERAPY:  Exemestane 2561maily   INTERVAL HISTORY:  [name] here for a follow up of right breast cancer. She was last seen by me in 04/2019 and seen by NP Lacie in 10/2019. She presents to the clinic alone. She is overall doing well, main concern is multiple arthritis and pain, especially in b/l shoulder and back, she had back surgery before, she takes Tylenol as needed. She has limited range of motion of bilateral shoulders.  She still able to function at home.  She is uses a craFurniture conservator/restorerer pain is not debilitating, not getting worse lately.  She otherwise doing well, denies cough, dyspnea, abdominal discomfort or other new complaints.  All other systems were reviewed with the patient and are negative.  MEDICAL HISTORY:  Past Medical History:  Diagnosis Date  . Arthritis   . Breast cancer ([place] . Breast cancer of upper-outer quadrant of right female breast ([place]/20/2015  . Depression   . Diabetes mellitus   . Hypercholesteremia   . Hypertension   . Insomnia   . Peptic ulcer   . Uterine cancer ([place]  dx in her 50s55s Wears glasses     SURGICAL HISTORY: Past  Surgical History:  Procedure Laterality Date  . ABDOMINAL HYSTERECTOMY  1995  . BACK SURGERY  2000   lumb lam  . BREAST LUMPECTOMY Right 2015  . COLONOSCOPY    . ORIF METACARPAL FRACTURE  2012   left  . RADIOACTIVE SEED GUIDED PARTIAL MASTECTOMY WITH AXILLARY SENTINEL LYMPH NODE BIOPSY Right 12/17/2014    Procedure: RIGHT BREAST RADIOACTIVE SEED LOCALIZED LUMPECTOMY AND SENTINEL NODE MAPPING;  Surgeon: Autumn Messing III, MD;  Location: [place];  Service: General;  Laterality: Right;    I have reviewed the social history and family history with the patient and they are unchanged from previous note.  ALLERGIES:  has No Known Allergies.  MEDICATIONS:  Current Outpatient Medications  Medication Sig Dispense Refill  . ACCU-CHEK GUIDE test strip     . AIMSCO INSULIN SYR ULTRA THIN 31G X 5/16" 0.3 ML MISC     . aspirin EC 81 MG tablet Take 81 mg by mouth daily.      Marland Kitchen atorvastatin (LIPITOR) 40 MG tablet Take 40 mg by mouth daily.      . Blood Glucose Calibration (ACCU-CHEK GUIDE CONTROL) LIQD     . Calcium Carbonate-Vitamin D (CALCIUM 600+D) 600-400 MG-UNIT per tablet Take 1 tablet by mouth daily.      . cycloSPORINE (RESTASIS) 0.05 % ophthalmic emulsion Place 1 drop into both eyes 2 (two) times daily.      Marland Kitchen dextromethorphan-guaiFENesin (MUCINEX DM) 30-600 MG per 12 hr tablet Take 1 tablet by mouth 2 (two) times daily.    Marland Kitchen glimepiride (AMARYL) 4 MG tablet Take 4 mg by mouth 2 (two) times daily with a meal.      . lisinopril-hydrochlorothiazide (PRINZIDE,ZESTORETIC) 10-12.5 MG per tablet Take 1 tablet by mouth daily.      . metFORMIN (GLUCOPHAGE) 1000 MG tablet As directed    . NIFEdipine (PROCARDIA XL/ADALAT-CC) 60 MG 24 hr tablet Take 60 mg by mouth daily.      . Omega-3 Fatty Acids (FISH OIL) 1000 MG CAPS Take 1 capsule by mouth daily.    . pantoprazole (PROTONIX) 40 MG tablet Take 40 mg by mouth daily.    . sertraline (ZOLOFT) 100 MG tablet Take 100 mg by mouth every morning.      . triamcinolone cream (KENALOG) 0.5 %     . vitamin C (ASCORBIC ACID) 500 MG tablet Take 500 mg by mouth daily.       No current facility-administered medications for this visit.    PHYSICAL EXAMINATION: ECOG PERFORMANCE STATUS: 2 - Symptomatic, <50% confined to bed  Vitals:   02/03/21 1103   BP: (!) 177/75  Pulse: 89  Resp: 16  Temp: 98 F (36.7 C)  SpO2: 100%   Filed Weights   02/03/21 1103  Weight: 168 lb 8 oz (76.4 kg)    GENERAL:alert, no distress and comfortable SKIN: skin color, texture, turgor are normal, no rashes or significant lesions EYES: normal, Conjunctiva are pink and non-injected, sclera clear NECK: supple, thyroid normal size, non-tender, without nodularity LYMPH:  no palpable lymphadenopathy in the cervical, axillary  LUNGS: clear to auscultation and percussion with normal breathing effort HEART: regular rate & rhythm and no murmurs and no lower extremity edema ABDOMEN:abdomen soft, non-tender and normal bowel sounds Musculoskeletal:no cyanosis of digits and no clubbing, limited range of motion of bilateral shoulder NEURO: alert & oriented x 3 with fluent speech, no focal motor/sensory deficits Breasts: Breast inspection showed them to be symmetrical with no nipple discharge.  Palpation of the breasts and axilla revealed no obvious mass that I could appreciate.   LABORATORY DATA:  I have reviewed the data as listed CBC Latest Ref Rng & Units 02/03/2021 01/27/2021 11/05/2019  WBC 4.0 - 10.5 K/uL 7.1 7.1 6.1  Hemoglobin 12.0 - 15.0 g/dL 9.1(L) 9.3(L) 10.8(L)  Hematocrit 36.0 - 46.0 % 29.0(L) 28.8(L) 34.6(L)  Platelets 150 - 400 K/uL 218 185 180     CMP Latest Ref Rng & Units 02/03/2021 01/27/2021 11/05/2019  Glucose 70 - 99 mg/dL 134(H) 125(H) 245(H)  BUN 8 - 23 mg/dL 79(H) 42(H) 29(H)  Creatinine 0.44 - 1.00 mg/dL 3.08(HH) 2.54(H) 1.51(H)  Sodium 135 - 145 mmol/L 142 143 144  Potassium 3.5 - 5.1 mmol/L 3.9 4.5 4.0  Chloride 98 - 111 mmol/L 110 106 107  CO2 22 - 32 mmol/L 22 26 26   Calcium 8.9 - 10.3 mg/dL 9.6 9.5 8.9  Total Protein 6.5 - 8.1 g/dL 8.2(H) 7.2 7.3  Total Bilirubin 0.3 - 1.2 mg/dL 0.3 0.4 0.4  Alkaline Phos 38 - 126 U/L 78 - 88  AST 15 - 41 U/L 21 23 25   ALT 0 - 44 U/L 19 17 22       RADIOGRAPHIC STUDIES: I have personally  reviewed the radiological images as listed and agreed with the findings in the report. No results found.   ASSESSMENT & PLAN:  BRAELYNNE GARINGER is a 80 y.o. female with    1. 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 -She was diagnosed in 10/2014. She is s/p right lumpectomy, adjuvantradiation and of AI with Exemestane.  -She is clinically stable, exam was unremarkable, no clinical concern for recurrence -Lab reviewed, slight worsening CKD and anemia -She has completed 5 years adjuvant AI, will stop exemestane after she completes this bottle. -We discussed risks of late recurrence, will continue cancer surveillance -Recent mammogram and ultrasound showed probable necrosis at the right breast 10 o'clock position, she is scheduled for left mammogram in July 2022.  Breast exam was unremarkable today. -Follow-up in 1 year   2. Arthritis and arthralgia -Mainly in her shoulders and lumbar, lumbar surgery before -I recommend physical therapy, I made referral today   3. Osteopenia -Her12/2018DEXA showed osteopenia, T score at left femoral neck -1.3. Her 11/2019 DEXA showed improvement and now normal (T-score 0.9).  -Continuecalcium and vitamin D.   4. HTN, DM, CKD -On Glimepiride, Metformin. Continue to f/u with PCP for management.  -Her renal function has getting worse daily, I recommend her follow-up with PCP closely   5. Mild normocytic anemia, anemia of chronic disease  -She has had a mild anemia since 2015, hemoglobin around 11 -Iron study,folate, B12 were normal in 2016. Her mild anemia is probably related to her chronic disease  -I encouraged her to take OTC prenatal vitamin.    PLAN: -She is clinically doing well  -stop Exemestane when she completes this bottle, she has completed 5 years therapy  -lab and f/u with lacie in one year  -Left diagnostic mammogram in July 2022, bilateral  screening mammogram in January 2023 -PT referral    No problem-specific Assessment & Plan notes found for this encounter.   Orders Placed This Encounter  Procedures  . MM Digital Screening    Standing Status:   Future    Standing Expiration Date:   02/03/2022    Order Specific Question:   Reason for Exam (SYMPTOM  OR DIAGNOSIS REQUIRED)  Answer:   screening    Order Specific Question:   Preferred imaging location?    Answer:   [facility]  . Ambulatory referral to Physical Therapy    Referral Priority:   Routine    Referral Type:   Physical Medicine    Referral Reason:   Specialty Services Required    Requested Specialty:   Physical Therapy    Number of Visits Requested:   1   All questions were answered. The patient knows to call the clinic with any problems, questions or concerns. No barriers to learning was detected. The total time spent in the appointment was 30 minutes.     Truitt Merle, MD 02/03/2021   I, Joslyn Devon, am acting as scribe for Truitt Merle, MD.   I have reviewed the above documentation for accuracy and completeness, and I agree with the above.

## 2021-02-03 ENCOUNTER — Inpatient Hospital Stay: Payer: Medicare Other

## 2021-02-03 ENCOUNTER — Telehealth: Payer: Self-pay | Admitting: Hematology

## 2021-02-03 ENCOUNTER — Inpatient Hospital Stay: Payer: Medicare Other | Attending: Internal Medicine | Admitting: Hematology

## 2021-02-03 ENCOUNTER — Other Ambulatory Visit: Payer: Self-pay

## 2021-02-03 ENCOUNTER — Encounter: Payer: Self-pay | Admitting: Hematology

## 2021-02-03 VITALS — BP 177/75 | HR 89 | Temp 98.0°F | Resp 16 | Ht 63.0 in | Wt 168.5 lb

## 2021-02-03 DIAGNOSIS — Z923 Personal history of irradiation: Secondary | ICD-10-CM | POA: Diagnosis not present

## 2021-02-03 DIAGNOSIS — D631 Anemia in chronic kidney disease: Secondary | ICD-10-CM | POA: Insufficient documentation

## 2021-02-03 DIAGNOSIS — Z170 Estrogen receptor positive status [ER+]: Secondary | ICD-10-CM | POA: Diagnosis not present

## 2021-02-03 DIAGNOSIS — E1122 Type 2 diabetes mellitus with diabetic chronic kidney disease: Secondary | ICD-10-CM | POA: Insufficient documentation

## 2021-02-03 DIAGNOSIS — M85852 Other specified disorders of bone density and structure, left thigh: Secondary | ICD-10-CM | POA: Insufficient documentation

## 2021-02-03 DIAGNOSIS — N189 Chronic kidney disease, unspecified: Secondary | ICD-10-CM | POA: Insufficient documentation

## 2021-02-03 DIAGNOSIS — Z7984 Long term (current) use of oral hypoglycemic drugs: Secondary | ICD-10-CM | POA: Diagnosis not present

## 2021-02-03 DIAGNOSIS — M8588 Other specified disorders of bone density and structure, other site: Secondary | ICD-10-CM

## 2021-02-03 DIAGNOSIS — C50411 Malignant neoplasm of upper-outer quadrant of right female breast: Secondary | ICD-10-CM | POA: Diagnosis not present

## 2021-02-03 DIAGNOSIS — Z853 Personal history of malignant neoplasm of breast: Secondary | ICD-10-CM | POA: Insufficient documentation

## 2021-02-03 DIAGNOSIS — I129 Hypertensive chronic kidney disease with stage 1 through stage 4 chronic kidney disease, or unspecified chronic kidney disease: Secondary | ICD-10-CM | POA: Diagnosis not present

## 2021-02-03 DIAGNOSIS — M1909 Primary osteoarthritis, other specified site: Secondary | ICD-10-CM | POA: Insufficient documentation

## 2021-02-03 DIAGNOSIS — Z1231 Encounter for screening mammogram for malignant neoplasm of breast: Secondary | ICD-10-CM

## 2021-02-03 DIAGNOSIS — Z794 Long term (current) use of insulin: Secondary | ICD-10-CM | POA: Diagnosis not present

## 2021-02-03 LAB — CBC WITH DIFFERENTIAL/PLATELET
Abs Immature Granulocytes: 0.03 10*3/uL (ref 0.00–0.07)
Basophils Absolute: 0 10*3/uL (ref 0.0–0.1)
Basophils Relative: 0 %
Eosinophils Absolute: 0.3 10*3/uL (ref 0.0–0.5)
Eosinophils Relative: 4 %
HCT: 29 % — ABNORMAL LOW (ref 36.0–46.0)
Hemoglobin: 9.1 g/dL — ABNORMAL LOW (ref 12.0–15.0)
Immature Granulocytes: 0 %
Lymphocytes Relative: 25 %
Lymphs Abs: 1.8 10*3/uL (ref 0.7–4.0)
MCH: 30 pg (ref 26.0–34.0)
MCHC: 31.4 g/dL (ref 30.0–36.0)
MCV: 95.7 fL (ref 80.0–100.0)
Monocytes Absolute: 0.8 10*3/uL (ref 0.1–1.0)
Monocytes Relative: 12 %
Neutro Abs: 4.1 10*3/uL (ref 1.7–7.7)
Neutrophils Relative %: 59 %
Platelets: 218 10*3/uL (ref 150–400)
RBC: 3.03 MIL/uL — ABNORMAL LOW (ref 3.87–5.11)
RDW: 13.9 % (ref 11.5–15.5)
WBC: 7.1 10*3/uL (ref 4.0–10.5)
nRBC: 0 % (ref 0.0–0.2)

## 2021-02-03 LAB — COMPREHENSIVE METABOLIC PANEL
ALT: 19 U/L (ref 0–44)
AST: 21 U/L (ref 15–41)
Albumin: 3.9 g/dL (ref 3.5–5.0)
Alkaline Phosphatase: 78 U/L (ref 38–126)
Anion gap: 10 (ref 5–15)
BUN: 79 mg/dL — ABNORMAL HIGH (ref 8–23)
CO2: 22 mmol/L (ref 22–32)
Calcium: 9.6 mg/dL (ref 8.9–10.3)
Chloride: 110 mmol/L (ref 98–111)
Creatinine, Ser: 3.08 mg/dL (ref 0.44–1.00)
GFR, Estimated: 15 mL/min — ABNORMAL LOW (ref >60)
Glucose, Bld: 134 mg/dL — ABNORMAL HIGH (ref 70–99)
Potassium: 3.9 mmol/L (ref 3.5–5.1)
Sodium: 142 mmol/L (ref 135–145)
Total Bilirubin: 0.3 mg/dL (ref 0.3–1.2)
Total Protein: 8.2 g/dL — ABNORMAL HIGH (ref 6.5–8.1)

## 2021-02-03 NOTE — Telephone Encounter (Signed)
Left message with follow-up appointment per 2/17 los. Gave option to call back to reschedule if needed.

## 2021-02-17 ENCOUNTER — Encounter: Payer: Self-pay | Admitting: Rehabilitation

## 2021-02-17 ENCOUNTER — Other Ambulatory Visit: Payer: Self-pay

## 2021-02-17 ENCOUNTER — Ambulatory Visit: Payer: Medicare Other | Attending: Hematology | Admitting: Rehabilitation

## 2021-02-17 DIAGNOSIS — M542 Cervicalgia: Secondary | ICD-10-CM | POA: Insufficient documentation

## 2021-02-17 DIAGNOSIS — G8929 Other chronic pain: Secondary | ICD-10-CM | POA: Insufficient documentation

## 2021-02-17 DIAGNOSIS — M25512 Pain in left shoulder: Secondary | ICD-10-CM | POA: Diagnosis not present

## 2021-02-17 DIAGNOSIS — R2689 Other abnormalities of gait and mobility: Secondary | ICD-10-CM | POA: Insufficient documentation

## 2021-02-17 DIAGNOSIS — M25511 Pain in right shoulder: Secondary | ICD-10-CM | POA: Diagnosis present

## 2021-02-17 NOTE — Therapy (Signed)
[place], [place], [address] Phone: [phone]   Fax:  [phone]  Physical Therapy Evaluation  Patient Details  Name: Erica Whitney MRN: [id number] Date of Birth: 07-11-1941 Referring Provider (PT): Dr. Burr Medico   Encounter Date: 02/17/2021   PT End of Session - 02/17/21 1412    Visit Number 1    Number of Visits 8    Date for PT Re-Evaluation 03/17/21    Authorization Type MCR    Progress Note Due on Visit 10    PT Start Time 1300    PT Stop Time 1354    PT Time Calculation (min) 54 min    Activity Tolerance Patient tolerated treatment well    Behavior During Therapy [facility] for tasks assessed/performed           Past Medical History:  Diagnosis Date  . Arthritis   . Breast cancer ([place])   . Breast cancer of upper-outer quadrant of right female breast ([place]) 11/06/2014  . Depression   . Diabetes mellitus   . Hypercholesteremia   . Hypertension   . Insomnia   . Peptic ulcer   . Uterine cancer ([place])    dx in her 40s  . Wears glasses     Past Surgical History:  Procedure Laterality Date  . ABDOMINAL HYSTERECTOMY  1995  . BACK SURGERY  2000   lumb lam  . BREAST LUMPECTOMY Right 2015  . COLONOSCOPY    . ORIF METACARPAL FRACTURE  2012   left  . RADIOACTIVE SEED GUIDED PARTIAL MASTECTOMY WITH AXILLARY SENTINEL LYMPH NODE BIOPSY Right 12/17/2014   Procedure: RIGHT BREAST RADIOACTIVE SEED LOCALIZED LUMPECTOMY AND SENTINEL NODE MAPPING;  Surgeon: Autumn Messing III, MD;  Location: [place];  Service: General;  Laterality: Right;    There were no vitals filed for this visit.    Subjective Assessment - 02/17/21 1309    Subjective My arms and neck are just bothering me.  They have been hurting me x 10 years getting worse.  I can't reach the top of the head    Pertinent History Treatment for right breast cancer completed in 2015 with lumpectomy/SLNB, antiestrogen x 5 years with  NED.  Recent bone scan showing scoliosis, degenerative changes in the thoracic and lumbar spine and feet.  Other history includes DM, HTN, uterine cancer, lumbar laminectomy in 2000, CKD    Limitations Lifting    Diagnostic tests MRI of neck and shoulders 10 years ago showing OA and foraminal narrowing    Patient Stated Goals less pain, move my arms    Currently in Pain? Yes    Pain Score 10-Worst pain ever   bil shoulders and neck   Pain Location Shoulder   bil and neck   Pain Orientation Right;Left    Pain Descriptors / Indicators Aching;Sharp    Pain Type Chronic pain    Pain Onset More than a month ago    Pain Frequency Constant    Aggravating Factors  movement    Pain Relieving Factors rest, medicine like OA tylenol              [facility] PT Assessment - 02/17/21 0001      Assessment   Medical Diagnosis OA/ debility    Referring Provider (PT) Dr. Burr Medico    Onset Date/Surgical Date 12/18/98    Hand Dominance Right      Precautions   Precaution Comments cancer history  Restrictions   Weight Bearing Restrictions No      Balance Screen   Has the patient fallen in the past 6 months --   so many times   Has the patient had a decrease in activity level because of a fear of falling?  Yes    Is the patient reluctant to leave their home because of a fear of falling?  Yes      [place] Private residence    Living Arrangements Alone    Available Help at Discharge Family   daughter does shopping and bills   Type of Home Apartment   senior   Home Access Level entry    Home Layout One level    [place] - single point    Additional Comments uses grabber for toilet paper      Prior Function   Level of Independence Needs assistance with ADLs;Needs assistance with homemaking    Vocation Retired    Leisure walks to [employer]    Comments uses SPC just out of the house      Cognition   Overall Cognitive Status Within Functional Limits  for tasks assessed      Posture/Postural Control   Posture/Postural Control Postural limitations    Postural Limitations Rounded Shoulders;Forward head;Increased thoracic kyphosis;Flexed trunk      ROM / Strength   AROM / PROM / Strength Strength;AROM      AROM   Overall AROM Comments stands with forward trunk lean around 20-30deg    AROM Assessment Site Shoulder    Right/Left Shoulder Right;Left    Right Shoulder Flexion 130 Degrees    Right Shoulder ABduction 100 Degrees    Left Shoulder Flexion 90 Degrees    Left Shoulder ABduction 125 Degrees      Strength   Strength Assessment Site Shoulder;Elbow;Hand;Hip;Knee;Ankle    Right/Left Shoulder Right;Left    Right/Left Elbow Right;Left    Right/Left hand Right;Left    Right Hand Grip (lbs) 30   42#   Left Hand Grip (lbs) 20   norm 37#     Ambulation/Gait   Gait Comments ambulates with SPC with forward flexed trunk                      Objective measurements completed on examination: See above findings.       [place] Adult PT Treatment/Exercise - 02/17/21 0001      Exercises   Exercises Other Exercises    Other Exercises  supine HOB elevated bil shoulder punch and small circles x 5 each, seated postural education, scapular retractions, and easy UT stretch bil                  PT Education - 02/17/21 1411    Education Details initial POC and HEP    Person(s) Educated Patient    Methods Explanation;Verbal cues;Demonstration;Tactile cues;Handout    Comprehension Verbalized understanding;Returned demonstration;Verbal cues required;Tactile cues required;Need further instruction               PT Long Term Goals - 02/17/21 1418      PT LONG TERM GOAL #1   Title Pt will report improved ability to wash in the tub by at least 50%    Time 5    Period Weeks    Status New      PT LONG TERM GOAL #2   Title Pt improve shoulder flexion bil to at least 135  to demonstrate improved reach    Baseline Rt:  90,  Lt: 130    Time 5    Period Weeks    Status New      PT LONG TERM GOAL #3   Title Pt will be ind with HEP for continued strength and mobility    Time 4    Period Weeks    Status New      PT LONG TERM GOAL #4   Title Pt will have balance goal after assessment of mobility and balance    Time 4    Period Weeks                  Plan - 02/17/21 1412    Clinical Impression Statement Pt presents with long history of OA, DDD, and 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  Pt has overall high levels of pain with movement but seems open to trying PT.  She also falls "more than she can count" so pt is also agreeable to assessing balance and including this in the POC.    Personal Factors and Comorbidities Age;Fitness;Time since onset of injury/illness/exacerbation;Comorbidity 3+    Comorbidities cancer history Rt breast, CKD, DM, OA status    Examination-Activity Limitations Lift;Bathing;Toileting;Hygiene/Grooming;Carry    Examination-Participation Restrictions Cleaning;Meal Prep;Yard Work;Community Activity    Stability/Clinical Decision Making Stable/Uncomplicated    Clinical Decision Making Low    Rehab Potential Fair    PT Frequency 2x / week    PT Duration 4 weeks    PT Treatment/Interventions ADLs/Self Care Home Management;Therapeutic exercise;Patient/family education;Manual lymph drainage;Manual techniques;Gait training;Passive range of motion    PT Next Visit Plan Assess balance with goals and addition to POC, general exercise/mobility for UE/LE, sit to stand, limit by pain response    PT Home Exercise Plan Access Code: [email or web address]    Consulted and Agree with Plan of Care Patient           Patient will benefit from skilled therapeutic intervention in order to improve the following deficits and impairments:  Pain,Difficulty walking,Decreased balance,Decreased  safety awareness,Decreased activity tolerance,Decreased range of motion,Decreased strength,Impaired UE functional use  Visit Diagnosis: Chronic left shoulder pain  Chronic right shoulder pain  Neck pain  Other abnormalities of gait and mobility     Problem List Patient Active Problem List   Diagnosis Date Noted  . Osteopenia 01/13/2015  . Uterine cancer ([place])   . Breast cancer of upper-outer quadrant of right female breast ([place]) 11/06/2014    Stark Bray 02/17/2021, 2:21 PM  [place], [place], [address] Phone: [phone]   Fax:  [phone]  Name: Erica Whitney MRN: [id number] Date of Birth: 1941-04-11

## 2021-02-17 NOTE — Patient Instructions (Signed)
Access Code: [email or web address] URL: [email or web address]: 03/03/2022Prepared by: Marcene Brawn TevisExercises  Seated Correct Posture - 7 x weekly  Seated Scapular Retraction - 3 x daily - 7 x weekly - 10 reps  Seated Gentle Upper Trapezius Stretch - 2 x daily - 7 x weekly - 2 reps - 20-30 seconds hold  Supine Shoulder Press - 1 x daily - 7 x weekly - 1 sets - 10 reps - 6 second hold  Supine Shoulder Circles - 1 x daily - 7 x weekly - 1 sets - 10 reps

## 2021-02-22 ENCOUNTER — Other Ambulatory Visit: Payer: Self-pay

## 2021-02-22 ENCOUNTER — Ambulatory Visit: Payer: Medicare Other

## 2021-02-22 DIAGNOSIS — R2689 Other abnormalities of gait and mobility: Secondary | ICD-10-CM

## 2021-02-22 DIAGNOSIS — M542 Cervicalgia: Secondary | ICD-10-CM

## 2021-02-22 DIAGNOSIS — G8929 Other chronic pain: Secondary | ICD-10-CM

## 2021-02-22 DIAGNOSIS — M25512 Pain in left shoulder: Secondary | ICD-10-CM | POA: Diagnosis not present

## 2021-02-22 NOTE — Patient Instructions (Signed)
Cancer Rehab [phone] HIP: Flexion Standing    Holding onto counter lift leg straight in front keeping knee straight. Slow and controlled! _10__ reps per set, _2-3__ sets per day. Then repeat with other leg.   Hip Extension (Standing)    Stand with support at counter. Squeeze pelvic floor and hold so as not to twist hips and don't lean forward.  Move right leg backward with straight knee. Slow and controlled! Repeat _10__ times. Do _2-3__ times a day. Repeat with other leg.  Hip Abduction (Standing)    Stand with support. Squeeze pelvic floor and hold. Lift right leg out to side, keeping toe forward.  Repeat _10__ times. Do _2-3__ times a day. Repeat with other leg.    Heel Raises    Stand with support. Tighten pelvic floor and hold. With knees straight, raise heels off ground. Hold _3__ seconds. Repeat _10-20__ times. Do _2__ times a day.   KNEE: Extension, Long Arc Quads - Sitting    Raise leg until knee is straight. __10_ reps per set, __2_ sets per day.   Marching    Alternate lifting knees as high as is comfortable, as if marching. Remember to sit tall and not lean side to side.  Repeat _10__ times each leg. Do _2__ sessions per day.  Practice sit to stand from dining chair. Start by scooting to the edge of the chair, tuck your feet underneath knees and slightly lean forward to stand all the way up. Repeat 5-10 times. Do 2-3 times a day.   Balance Exercises:  SINGLE LIMB STANCE    Stand at counter holding on with both hands. Raise leg. Hold _30__ seconds. Repeat with other leg.  _3-5__ reps per set, _2-3__ sets per day.   Tandem Stance    Stand at counter holding with both hands. Right foot in front of left, heel touching toe both feet "straight ahead". Stand on Foot Triangle of Support with both feet. Balance in this position _30__ seconds. Then do with left foot in front of right.

## 2021-02-22 NOTE — Therapy (Signed)
[place], [place], [address] Phone: [phone]   Fax:  [phone]  Physical Therapy Treatment  Patient Details  Name: Erica Whitney MRN: [id number] Date of Birth: 03-29-1941 Referring Provider (PT): Dr. Burr Medico   Encounter Date: 02/22/2021   PT End of Session - 02/22/21 1225    Visit Number 2    Number of Visits 8    Date for PT Re-Evaluation 03/17/21    Authorization Type MCR    PT Start Time 1106    PT Stop Time 1202    PT Time Calculation (min) 56 min    Activity Tolerance Patient tolerated treatment well    Behavior During Therapy [facility] for tasks assessed/performed           Past Medical History:  Diagnosis Date  . Arthritis   . Breast cancer ([place])   . Breast cancer of upper-outer quadrant of right female breast ([place]) 11/06/2014  . Depression   . Diabetes mellitus   . Hypercholesteremia   . Hypertension   . Insomnia   . Peptic ulcer   . Uterine cancer ([place])    dx in her 50s  . Wears glasses     Past Surgical History:  Procedure Laterality Date  . ABDOMINAL HYSTERECTOMY  1995  . BACK SURGERY  2000   lumb lam  . BREAST LUMPECTOMY Right 2015  . COLONOSCOPY    . ORIF METACARPAL FRACTURE  2012   left  . RADIOACTIVE SEED GUIDED PARTIAL MASTECTOMY WITH AXILLARY SENTINEL LYMPH NODE BIOPSY Right 12/17/2014   Procedure: RIGHT BREAST RADIOACTIVE SEED LOCALIZED LUMPECTOMY AND SENTINEL NODE MAPPING;  Surgeon: Autumn Messing III, MD;  Location: [place];  Service: General;  Laterality: Right;    There were no vitals filed for this visit.   Subjective Assessment - 02/22/21 1109    Subjective I've been doing the exercises she gave me last time and my arms are moving better. I still feel stiff but I don't have any pain right now. I've really been trying to sit up taller too and that's helping.    Pertinent History Treatment for right breast cancer completed in 2015 with  lumpectomy/SLNB, antiestrogen x 5 years with NED.  Recent bone scan showing scoliosis, degenerative changes in the thoracic and lumbar spine and feet.  Other history includes DM, HTN, uterine cancer, lumbar laminectomy in 2000, CKD    Patient Stated Goals less pain, move my arms    Currently in Pain? No/denies                             [facility] Adult PT Treatment/Exercise - 02/22/21 0001      Neuro Re-ed    Neuro Re-ed Details  Multiple trials of bil tandem stances and bil SLS stance at back of bike for +1-2 HHA returning therapist demo      Exercises   Other Exercises  Seated edge of bed reviewed UT stretch and changed it to bil rotation and bil side bending      Knee/Hip Exercises: Standing   Heel Raises Both;10 reps   at back of bike for +2 HHA   Hip Flexion AROM;Right;Left;10 reps   at back of bike for +2 HHA   Hip Flexion Limitations pt returned therapist demo    Hip Abduction AROM;Right;Left;10 reps   at back of bike for +2 HHA   Abduction Limitations pt returned therapist demo  Hip Extension AROM;Right;Left;10 reps   at back of bike for +2 HHA   Extension Limitations Pt returning therapist demo and tactile cues to decrease forward trunk lean      Knee/Hip Exercises: Seated   Long Arc Quad AROM;Right;Left;10 reps   hold 5 sec   Marching AROM;Right;Left;10 reps    Sit to Sand 10 reps;without UE support      Shoulder Exercises: Supine   Other Supine Exercises Reviewed supine chest press with arms at side then slightly abducted, then bil shoulder circles reviewing correct UE positioning                       PT Long Term Goals - 02/17/21 1418      PT LONG TERM GOAL #1   Title Pt will report improved ability to wash in the tub by at least 50%    Time 5    Period Weeks    Status New      PT LONG TERM GOAL #2   Title Pt improve shoulder flexion bil to at least 135 to demonstrate improved reach    Baseline Rt: 90,  Lt: 130    Time 5     Period Weeks    Status New      PT LONG TERM GOAL #3   Title Pt will be ind with HEP for continued strength and mobility    Time 4    Period Weeks    Status New      PT LONG TERM GOAL #4   Title Pt will have balance goal after assessment of mobility and balance    Time 4    Period Weeks                 Plan - 02/22/21 1225    Clinical Impression Statement Reviewed HEP issued at last session and progressed HEP today as well.  Included standing and seated bil LE exercises, along with static balance activities. Pt verbalized being grateful for all HEP issued at this time and reports has been feeling so much better. Her seated posture is much improved and she demonstrates being able to self correct posture each time she sat back down.    Personal Factors and Comorbidities Age;Fitness;Time since onset of injury/illness/exacerbation;Comorbidity 3+    Comorbidities cancer history Rt breast, CKD, DM, OA status    Examination-Activity Limitations Lift;Bathing;Toileting;Hygiene/Grooming;Carry    Examination-Participation Restrictions Cleaning;Meal Prep;Yard Work;Community Activity    Stability/Clinical Decision Making Stable/Uncomplicated    Rehab Potential Fair    PT Frequency 2x / week    PT Duration 4 weeks    PT Treatment/Interventions ADLs/Self Care Home Management;Therapeutic exercise;Patient/family education;Manual lymph drainage;Manual techniques;Gait training;Passive range of motion    PT Next Visit Plan Assess balance with goals and addition to POC, general exercise/mobility for UE/LE, sit to stand, limit by pain response    PT Home Exercise Plan Access Code: [email or web address]; standing and seated bil LE strength and balance static activities    Consulted and Agree with Plan of Care Patient           Patient will benefit from skilled therapeutic intervention in order to improve the following deficits and impairments:  Pain,Difficulty walking,Decreased balance,Decreased safety  awareness,Decreased activity tolerance,Decreased range of motion,Decreased strength,Impaired UE functional use  Visit Diagnosis: Chronic left shoulder pain  Chronic right shoulder pain  Neck pain  Other abnormalities of gait and mobility     Problem List Patient Active Problem List  Diagnosis Date Noted  . Osteopenia 01/13/2015  . Uterine cancer ([place])   . Breast cancer of upper-outer quadrant of right female breast ([place]) 11/06/2014    Otelia Limes, PTA 02/22/2021, 12:31 PM  [place], [place], [address] Phone: [phone]   Fax:  [phone]  Name: Erica Whitney MRN: [id number] Date of Birth: 1941/01/06

## 2021-02-25 ENCOUNTER — Ambulatory Visit: Payer: Medicare Other

## 2021-03-01 ENCOUNTER — Ambulatory Visit: Payer: Medicare Other

## 2021-03-01 ENCOUNTER — Other Ambulatory Visit: Payer: Self-pay

## 2021-03-01 DIAGNOSIS — M25512 Pain in left shoulder: Secondary | ICD-10-CM | POA: Diagnosis not present

## 2021-03-01 DIAGNOSIS — G8929 Other chronic pain: Secondary | ICD-10-CM

## 2021-03-01 DIAGNOSIS — M542 Cervicalgia: Secondary | ICD-10-CM

## 2021-03-01 DIAGNOSIS — R2689 Other abnormalities of gait and mobility: Secondary | ICD-10-CM

## 2021-03-01 NOTE — Therapy (Signed)
[place], [place], [address] Phone: [phone]   Fax:  [phone]  Physical Therapy Treatment  Patient Details  Name: Erica Whitney MRN: [id number] Date of Birth: May 12, 1941 Referring Provider (PT): Dr. Burr Medico   Encounter Date: 03/01/2021   PT End of Session - 03/01/21 1105    Visit Number 3    Number of Visits 8    Date for PT Re-Evaluation 03/17/21    Authorization Type MCR    Progress Note Due on Visit 10    PT Start Time 1006    PT Stop Time 1100    PT Time Calculation (min) 54 min    Activity Tolerance Patient tolerated treatment well    Behavior During Therapy [facility] for tasks assessed/performed           Past Medical History:  Diagnosis Date  . Arthritis   . Breast cancer ([place])   . Breast cancer of upper-outer quadrant of right female breast ([place]) 11/06/2014  . Depression   . Diabetes mellitus   . Hypercholesteremia   . Hypertension   . Insomnia   . Peptic ulcer   . Uterine cancer ([place])    dx in her 83s  . Wears glasses     Past Surgical History:  Procedure Laterality Date  . ABDOMINAL HYSTERECTOMY  1995  . BACK SURGERY  2000   lumb lam  . BREAST LUMPECTOMY Right 2015  . COLONOSCOPY    . ORIF METACARPAL FRACTURE  2012   left  . RADIOACTIVE SEED GUIDED PARTIAL MASTECTOMY WITH AXILLARY SENTINEL LYMPH NODE BIOPSY Right 12/17/2014   Procedure: RIGHT BREAST RADIOACTIVE SEED LOCALIZED LUMPECTOMY AND SENTINEL NODE MAPPING;  Surgeon: Autumn Messing III, MD;  Location: [facility];  Service: General;  Laterality: Right;    There were no vitals filed for this visit.   Subjective Assessment - 03/01/21 1011    Subjective Pt reports doing her exercises.  She is feeling better overall and she feels like her balance is better too. No present pain    Pertinent History Treatment for right breast cancer completed in 2015 with lumpectomy/SLNB, antiestrogen x 5 years with NED.   Recent bone scan showing scoliosis, degenerative changes in the thoracic and lumbar spine and feet.  Other history includes DM, HTN, uterine cancer, lumbar laminectomy in 2000, CKD    Diagnostic tests MRI of neck and shoulders 10 years ago showing OA and foraminal narrowing    Patient Stated Goals less pain, move my arms    Currently in Pain? No/denies    Pain Score 0-No pain                             [facility] Adult PT Treatment/Exercise - 03/01/21 0001      Neuro Re-ed    Neuro Re-ed Details  vector reaches with each leg for/side and back, SLS   hand hold at back of bike     Exercises   Other Exercises  seated in chair 2x5 reps rotation and SB bilaterally      Knee/Hip Exercises: Standing   Heel Raises Both;15 reps   at back of bike for +2 HHA   Knee Flexion Strengthening;Right;Left;10 reps    Knee Flexion Limitations needs tactile cues to do properly    Hip Flexion AROM;Right;Left;Knee straight   12 reps   Hip Flexion Limitations pt returned therapist demo    Hip Abduction  AROM;Right;Left   at back of bike for +2 HHA 12 reps   Abduction Limitations pt returned therapist demo    Hip Extension AROM;Right;Left;10 reps   at back of bike for +2 HHA   Extension Limitations Pt returning therapist demo and tactile cues to decrease forward trunk lean      Knee/Hip Exercises: Seated   Long Arc Quad AROM;Right;Left;10 reps   hold 5 sec   Long Arc Quad Weight 2 lbs.    Marching Strengthening;Right;Left;1 set;10 reps    Marching Weights 2 lbs.    Sit to Sand 10 reps;without UE support      Shoulder Exercises: Seated   Extension Strengthening;Right;Left;10 reps    Theraband Level (Shoulder Extension) Level 1 (Yellow)    Retraction Strengthening;Both;10 reps    Theraband Level (Shoulder Retraction) Level 1 (Yellow)    Retraction Weight (lbs) pt multiple VC's demos to do correctly                       PT Long Term Goals - 03/01/21 1218      PT LONG TERM  GOAL #1   Title Pt will report improved ability to wash in the tub by at least 50%    Time 5    Period Weeks    Status New      PT LONG TERM GOAL #2   Baseline Rt: 90,  Lt: 130    Time 5    Period Weeks    Status New      PT LONG TERM GOAL #3   Title Pt will be ind with HEP for continued strength and mobility    Time 4    Period Weeks    Status New      PT LONG TERM GOAL #4   Title pt will be able to maintain tandem stance for 10 seconds without hand hold and without LOB    Time 4    Period Weeks    Status New                 Plan - 03/01/21 1213    Clinical Impression Statement Pt. reports she is feeling so much better since starting her exercise program and she is very compliant with HEP.  She does require extensive Verbal cueing to maintain erect posture with standing exercise, and needed occasional Tactile cues for proper positioning to isolate muscles correctly, for example with HS curls in standing.  We added a light wt for several sitting LE exs and she did very well.  We also did 2 sitting theraband exs for the scapular region/shoulders which were not added to HEP because we need to review again to be sure she is doing it correctly before giving for HEP    Personal Factors and Comorbidities Age;Fitness;Time since onset of injury/illness/exacerbation;Comorbidity 3+    Comorbidities cancer history Rt breast, CKD, DM, OA status    Examination-Activity Limitations Lift;Bathing;Toileting;Hygiene/Grooming;Carry    Examination-Participation Restrictions Cleaning;Meal Prep;Yard Work;Community Activity    Stability/Clinical Decision Making Stable/Uncomplicated    Rehab Potential Fair    PT Frequency 2x / week    PT Duration 4 weeks    PT Treatment/Interventions ADLs/Self Care Home Management;Therapeutic exercise;Patient/family education;Manual lymph drainage;Manual techniques;Gait training;Passive range of motion    PT Home Exercise Plan Access Code: [email or web address]; standing and  seated bil LE strength and balance static activities    Consulted and Agree with Plan of Care Patient  Patient will benefit from skilled therapeutic intervention in order to improve the following deficits and impairments:  Pain,Difficulty walking,Decreased balance,Decreased safety awareness,Decreased activity tolerance,Decreased range of motion,Decreased strength,Impaired UE functional use  Visit Diagnosis: Chronic left shoulder pain  Chronic right shoulder pain  Neck pain  Other abnormalities of gait and mobility     Problem List Patient Active Problem List   Diagnosis Date Noted  . Osteopenia 01/13/2015  . Uterine cancer ([place])   . Breast cancer of upper-outer quadrant of right female breast ([place]) 11/06/2014    Claris Pong 03/01/2021, 12:20 PM  [place], [place], [address] Phone: [phone]   Fax:  [phone]  Name: Erica Whitney MRN: [id number] Date of Birth: 1941-08-12  Cheral Almas, PT 03/01/21 12:21 PM

## 2021-03-04 ENCOUNTER — Ambulatory Visit: Payer: Medicare Other

## 2021-03-08 ENCOUNTER — Ambulatory Visit: Payer: Medicare Other | Admitting: Rehabilitation

## 2021-03-10 ENCOUNTER — Ambulatory Visit: Payer: Medicare Other | Admitting: Physical Therapy

## 2021-03-15 ENCOUNTER — Other Ambulatory Visit: Payer: Self-pay

## 2021-03-15 ENCOUNTER — Ambulatory Visit: Payer: Medicare Other | Admitting: Rehabilitation

## 2021-03-15 DIAGNOSIS — R2689 Other abnormalities of gait and mobility: Secondary | ICD-10-CM

## 2021-03-15 DIAGNOSIS — M25512 Pain in left shoulder: Secondary | ICD-10-CM

## 2021-03-15 DIAGNOSIS — G8929 Other chronic pain: Secondary | ICD-10-CM

## 2021-03-15 DIAGNOSIS — M542 Cervicalgia: Secondary | ICD-10-CM

## 2021-03-15 NOTE — Therapy (Signed)
[place], [place], [address] Phone: [phone]   Fax:  [phone]  Physical Therapy Treatment  Patient Details  Name: Erica Whitney MRN: [id number] Date of Birth: Oct 22, 1941 Referring Provider (PT): Dr. Burr Medico   Encounter Date: 03/15/2021   PT End of Session - 03/15/21 1151    Visit Number 4    Number of Visits 8    Date for PT Re-Evaluation 03/17/21    PT Start Time 1100    PT Stop Time 1150    PT Time Calculation (min) 50 min    Activity Tolerance Patient tolerated treatment well    Behavior During Therapy [facility] for tasks assessed/performed           Past Medical History:  Diagnosis Date  . Arthritis   . Breast cancer ([place])   . Breast cancer of upper-outer quadrant of right female breast ([place]) 11/06/2014  . Depression   . Diabetes mellitus   . Hypercholesteremia   . Hypertension   . Insomnia   . Peptic ulcer   . Uterine cancer ([place])    dx in her 28s  . Wears glasses     Past Surgical History:  Procedure Laterality Date  . ABDOMINAL HYSTERECTOMY  1995  . BACK SURGERY  2000   lumb lam  . BREAST LUMPECTOMY Right 2015  . COLONOSCOPY    . ORIF METACARPAL FRACTURE  2012   left  . RADIOACTIVE SEED GUIDED PARTIAL MASTECTOMY WITH AXILLARY SENTINEL LYMPH NODE BIOPSY Right 12/17/2014   Procedure: RIGHT BREAST RADIOACTIVE SEED LOCALIZED LUMPECTOMY AND SENTINEL NODE MAPPING;  Surgeon: Autumn Messing III, MD;  Location: [facility];  Service: General;  Laterality: Right;    There were no vitals filed for this visit.   Subjective Assessment - 03/15/21 1058    Pertinent History Treatment for right breast cancer completed in 2015 with lumpectomy/SLNB, antiestrogen x 5 years with NED.  Recent bone scan showing scoliosis, degenerative changes in the thoracic and lumbar spine and feet.  Other history includes DM, HTN, uterine cancer, lumbar laminectomy in 2000, CKD    Diagnostic  tests MRI of neck and shoulders 10 years ago showing OA and foraminal narrowing    Currently in Pain? No/denies                             [facility] Adult PT Treatment/Exercise - 03/15/21 0001      Ambulation/Gait   Gait Comments amb around 650ft without AD with vcs to stand upright as much as possible.      High Level Balance   High Level Balance Comments in parallel bars: side steps without hands x 1 length R and Lt, and heel to toe steps x 2 lengths with hand hold      Exercises   Exercises Knee/Hip      Knee/Hip Exercises: Stretches   Active Hamstring Stretch 1 rep;20 seconds    Active Hamstring Stretch Limitations bil seated edge of bed    Gastroc Stretch Both;10 seconds;2 reps    Gastroc Stretch Limitations pt reported some right calf tightness post heel raises      Knee/Hip Exercises: Standing   Heel Raises Both;10 reps    Heel Raises Limitations at counter    Knee Flexion Both;1 set;5 reps    Knee Flexion Limitations at counter 2# with vcs to stand upright    Hip Abduction Both;10 reps  Abduction Limitations at counter with vcs to stand upright 2#      Knee/Hip Exercises: Seated   Long Arc Quad Both;1 set;10 reps    Long Arc Quad Weight 2 lbs.    Ball Squeeze 10    Marching Both;10 reps    Marching Limitations added opposite arm raise    Marching Weights 2 lbs.    Sit to Sand 10 reps;without UE support      Shoulder Exercises: Seated   Elevation Both;5 reps;AAROM    Elevation Limitations with dowel    Extension Right;10 reps    Theraband Level (Shoulder Extension) Level 1 (Yellow)    Other Seated Exercises chest press with dowel x 10                       PT Long Term Goals - 03/01/21 1218      PT LONG TERM GOAL #1   Title Pt will report improved ability to wash in the tub by at least 50%    Time 5    Period Weeks    Status New      PT LONG TERM GOAL #2   Baseline Rt: 90,  Lt: 130    Time 5    Period Weeks    Status New       PT LONG TERM GOAL #3   Title Pt will be ind with HEP for continued strength and mobility    Time 4    Period Weeks    Status New      PT LONG TERM GOAL #4   Title pt will be able to maintain tandem stance for 10 seconds without hand hold and without LOB    Time 4    Period Weeks    Status New                 Plan - 03/15/21 1151    Clinical Impression Statement Pt is still very happy with progress and is very excited to progress exercises as able.  Pt reported she felt able to try LE exercises with weight and did well but cut back number if form was decreasing.  Added gait around the gym as pt reported she would like to walk more if she had someone with her (too cold outside).  Pt tolerated all well and updated HEP.  Pt will have goal check next visit but seems to want to continue PT sessions.    PT Frequency 2x / week    PT Duration 4 weeks    PT Treatment/Interventions ADLs/Self Care Home Management;Therapeutic exercise;Patient/family education;Manual lymph drainage;Manual techniques;Gait training;Passive range of motion    PT Next Visit Plan Assess balance with goals and addition to POC, general exercise/mobility for UE/LE, sit to stand, limit by pain response    Consulted and Agree with Plan of Care Patient           Patient will benefit from skilled therapeutic intervention in order to improve the following deficits and impairments:     Visit Diagnosis: Chronic left shoulder pain  Chronic right shoulder pain  Neck pain  Other abnormalities of gait and mobility     Problem List Patient Active Problem List   Diagnosis Date Noted  . Osteopenia 01/13/2015  . Uterine cancer ([place])   . Breast cancer of upper-outer quadrant of right female breast ([place]) 11/06/2014    Stark Bray 03/15/2021, 11:55 AM  [place]  [place], [place], [address] Phone: [phone]   Fax:   [phone]  Name: Erica Whitney MRN: [id number] Date of Birth: 1941/04/28

## 2021-03-17 ENCOUNTER — Other Ambulatory Visit: Payer: Self-pay

## 2021-03-17 ENCOUNTER — Ambulatory Visit: Payer: Medicare Other | Admitting: Rehabilitation

## 2021-03-17 ENCOUNTER — Encounter: Payer: Self-pay | Admitting: Rehabilitation

## 2021-03-17 DIAGNOSIS — G8929 Other chronic pain: Secondary | ICD-10-CM

## 2021-03-17 DIAGNOSIS — M542 Cervicalgia: Secondary | ICD-10-CM

## 2021-03-17 DIAGNOSIS — M25512 Pain in left shoulder: Secondary | ICD-10-CM | POA: Diagnosis not present

## 2021-03-17 DIAGNOSIS — R2689 Other abnormalities of gait and mobility: Secondary | ICD-10-CM

## 2021-03-17 NOTE — Therapy (Signed)
[place], [place], [address] Phone: [phone]   Fax:  [phone]  Physical Therapy Treatment  Patient Details  Name: Erica Whitney MRN: [id number] Date of Birth: 27-Mar-1941 Referring Provider (PT): Dr. Burr Medico   Encounter Date: 03/17/2021   PT End of Session - 03/17/21 1202    Visit Number 5    Number of Visits 8    Date for PT Re-Evaluation 04/28/21    Progress Note Due on Visit 10    PT Start Time 1100    PT Stop Time 1158    PT Time Calculation (min) 58 min    Activity Tolerance Patient tolerated treatment well    Behavior During Therapy [facility] for tasks assessed/performed           Past Medical History:  Diagnosis Date  . Arthritis   . Breast cancer ([place])   . Breast cancer of upper-outer quadrant of right female breast ([place]) 11/06/2014  . Depression   . Diabetes mellitus   . Hypercholesteremia   . Hypertension   . Insomnia   . Peptic ulcer   . Uterine cancer ([place])    dx in her 72s  . Wears glasses     Past Surgical History:  Procedure Laterality Date  . ABDOMINAL HYSTERECTOMY  1995  . BACK SURGERY  2000   lumb lam  . BREAST LUMPECTOMY Right 2015  . COLONOSCOPY    . ORIF METACARPAL FRACTURE  2012   left  . RADIOACTIVE SEED GUIDED PARTIAL MASTECTOMY WITH AXILLARY SENTINEL LYMPH NODE BIOPSY Right 12/17/2014   Procedure: RIGHT BREAST RADIOACTIVE SEED LOCALIZED LUMPECTOMY AND SENTINEL NODE MAPPING;  Surgeon: Autumn Messing III, MD;  Location: [facility];  Service: General;  Laterality: Right;    There were no vitals filed for this visit.   Subjective Assessment - 03/17/21 1101    Subjective I think the therapy is helping.  I would like to improve my arms so I can use them better.  It is hard to brush the hair, hard to take shirts over the head, I don't have to cook, clean much    Pertinent History Treatment for right breast cancer completed in 2015 with  lumpectomy/SLNB, antiestrogen x 5 years with NED.  Recent bone scan showing scoliosis, degenerative changes in the thoracic and lumbar spine and feet.  Other history includes DM, HTN, uterine cancer, lumbar laminectomy in 2000, CKD    Diagnostic tests MRI of neck and shoulders 10 years ago showing OA and foraminal narrowing    Patient Stated Goals less pain, move my arms    Currently in Pain? No/denies              [facility] PT Assessment - 03/17/21 1110      AROM   Right Shoulder Flexion 130 Degrees    Left Shoulder Flexion 130 Degrees   some left upper arm pain     Standardized Balance Assessment   Standardized Balance Assessment Five Times Sit to Stand;Berg Balance Test    Five times sit to stand comments  27.13 without hands: no pain or no reported difficulty   predicts recurrent falls     Berg Balance Test   Sit to Stand Able to stand without using hands and stabilize independently    Standing Unsupported Able to stand safely 2 minutes    Sitting with Back Unsupported but Feet Supported on Floor or Stool Able to sit safely and securely 2 minutes  Stand to Sit Sits safely with minimal use of hands    Transfers Able to transfer safely, minor use of hands    Standing Unsupported with Eyes Closed Able to stand 10 seconds safely    Standing Unsupported with Feet Together Able to place feet together independently and stand 1 minute safely    From Standing, Reach Forward with Outstretched Arm Can reach confidently >25 cm (10")    From Standing Position, Pick up Object from Floor Able to pick up shoe, needs supervision    From Standing Position, Turn to Look Behind Over each Shoulder Turn sideways only but maintains balance    Turn 360 Degrees Able to turn 360 degrees safely in 4 seconds or less    Standing Unsupported, Alternately Place Feet on Step/Stool Able to stand independently and complete 8 steps >20 seconds    Standing Unsupported, One Foot in Front Needs help to step but can  hold 15 seconds    Standing on One Leg Unable to try or needs assist to prevent fall    Total Score 45    Berg comment: high risk of falls                         [facility] Adult PT Treatment/Exercise - 03/17/21 0001      Knee/Hip Exercises: Standing   Hip Abduction Both;10 reps    Abduction Limitations at counter with vcs to stand upright 2#      Knee/Hip Exercises: Seated   Long Arc Quad Both;1 set;10 reps    Long Arc Quad Weight 2 lbs.      Shoulder Exercises: Standing   Flexion Both;5 reps    Flexion Limitations standing with dowel rod    Other Standing Exercises bicep curls 2# x 10 bil      Shoulder Exercises: Pulleys   Flexion 2 minutes    Flexion Limitations with cueing for intial performance.  Pt enjoying these and reporting increased stretching.  Given instruction section.                       PT Long Term Goals - 03/17/21 1114      PT LONG TERM GOAL #1   Title Pt will report improved ability to wash in the tub by at least 50%    Baseline pt reports improved ability to wash the back    Status Achieved      PT LONG TERM GOAL #2   Title Pt improve shoulder flexion bil to at least 135 to demonstrate improved reach    Baseline Rt: 90,  Lt: 130, bil 130 on 03/17/21    Status Partially Met      PT LONG TERM GOAL #3   Title Pt will be ind with HEP for continued strength and mobility    Status On-going      PT LONG TERM GOAL #4   Title pt will be able to maintain tandem stance for 10 seconds without hand hold and without LOB    Baseline 03/17/21: Rt forward 10" with no step assist    Lt: 10" with assist for foot placement    Status Achieved      PT LONG TERM GOAL #5   Title Pt will improve single leg stance time to 5 seconds on each leg without hand support to lessen fall risk    Time 6    Period Weeks    Status New  Target Date 04/28/21      Additional Long Term Goals   Additional Long Term Goals Yes      PT LONG TERM GOAL #6    Title Pt will perform 5x sit to stand in less then 17 seconds    Time 6    Period Weeks    Status New                 Plan - 03/17/21 1202    Clinical Impression Statement Pt would love to continue therapy to focus on UE functional Mobility and gait/balance.  Pt now has bil shoulder AROM at 130degrees improved from 90degrees and reported improved ability to wash in the tub.  Pt demonstrated recurrent high fall risk with BERG and 5x sit to stand today with most trouble being inability to stand on one leg, most other aspects were good requiring only SBA.  Extended POC 2x per week for up to 6 weeks.    Personal Factors and Comorbidities Age;Fitness;Time since onset of injury/illness/exacerbation;Comorbidity 3+    Comorbidities cancer history Rt breast, CKD, DM, OA status    PT Frequency 2x / week    PT Duration 4 weeks    PT Treatment/Interventions ADLs/Self Care Home Management;Therapeutic exercise;Patient/family education;Manual lymph drainage;Manual techniques;Gait training;Passive range of motion    PT Next Visit Plan add more single leg work, general exercise/mobility for UE/LE, sit to stand, limit by pain response    Consulted and Agree with Plan of Care Patient           Patient will benefit from skilled therapeutic intervention in order to improve the following deficits and impairments:  Pain,Difficulty walking,Decreased balance,Decreased safety awareness,Decreased activity tolerance,Decreased range of motion,Decreased strength,Impaired UE functional use  Visit Diagnosis: Chronic left shoulder pain  Chronic right shoulder pain  Neck pain  Other abnormalities of gait and mobility     Problem List Patient Active Problem List   Diagnosis Date Noted  . Osteopenia 01/13/2015  . Uterine cancer ([place])   . Breast cancer of upper-outer quadrant of right female breast ([place]) 11/06/2014    Stark Bray 03/17/2021, 12:09 PM  [place], [place], [address] Phone: [phone]   Fax:  [phone]  Name: Erica Whitney MRN: [id number] Date of Birth: 31-Jan-1941

## 2021-03-22 ENCOUNTER — Ambulatory Visit: Payer: Medicare Other | Attending: Hematology

## 2021-03-22 ENCOUNTER — Other Ambulatory Visit: Payer: Self-pay

## 2021-03-22 DIAGNOSIS — M25511 Pain in right shoulder: Secondary | ICD-10-CM | POA: Diagnosis present

## 2021-03-22 DIAGNOSIS — R2689 Other abnormalities of gait and mobility: Secondary | ICD-10-CM | POA: Insufficient documentation

## 2021-03-22 DIAGNOSIS — G8929 Other chronic pain: Secondary | ICD-10-CM | POA: Insufficient documentation

## 2021-03-22 DIAGNOSIS — M542 Cervicalgia: Secondary | ICD-10-CM | POA: Insufficient documentation

## 2021-03-22 DIAGNOSIS — M25512 Pain in left shoulder: Secondary | ICD-10-CM | POA: Insufficient documentation

## 2021-03-22 NOTE — Therapy (Signed)
[place], [place], [address] Phone: [phone]   Fax:  [phone]  Physical Therapy Treatment  Patient Details  Name: Erica Whitney MRN: [id number] Date of Birth: 1941-03-06 Referring Provider (PT): Dr. Burr Medico   Encounter Date: 03/22/2021   PT End of Session - 03/22/21 1146    Visit Number 6    Number of Visits 8    Date for PT Re-Evaluation 04/28/21    Authorization Type MCR    Progress Note Due on Visit 10    PT Start Time 1111    PT Stop Time 1204    PT Time Calculation (min) 53 min    Activity Tolerance Patient tolerated treatment well    Behavior During Therapy [facility] for tasks assessed/performed           Past Medical History:  Diagnosis Date  . Arthritis   . Breast cancer ([place])   . Breast cancer of upper-outer quadrant of right female breast ([place]) 11/06/2014  . Depression   . Diabetes mellitus   . Hypercholesteremia   . Hypertension   . Insomnia   . Peptic ulcer   . Uterine cancer ([place])    dx in her 55s  . Wears glasses     Past Surgical History:  Procedure Laterality Date  . ABDOMINAL HYSTERECTOMY  1995  . BACK SURGERY  2000   lumb lam  . BREAST LUMPECTOMY Right 2015  . COLONOSCOPY    . ORIF METACARPAL FRACTURE  2012   left  . RADIOACTIVE SEED GUIDED PARTIAL MASTECTOMY WITH AXILLARY SENTINEL LYMPH NODE BIOPSY Right 12/17/2014   Procedure: RIGHT BREAST RADIOACTIVE SEED LOCALIZED LUMPECTOMY AND SENTINEL NODE MAPPING;  Surgeon: Autumn Messing III, MD;  Location: [facility];  Service: General;  Laterality: Right;    There were no vitals filed for this visit.   Subjective Assessment - 03/22/21 1114    Subjective This therapy has worked out for me good. I am moving better and I feel better every time I leave here. My Lt shoulder just always feels so tight but it helps when I move it around.    Pertinent History Treatment for right breast cancer completed in 2015  with lumpectomy/SLNB, antiestrogen x 5 years with NED.  Recent bone scan showing scoliosis, degenerative changes in the thoracic and lumbar spine and feet.  Other history includes DM, HTN, uterine cancer, lumbar laminectomy in 2000, CKD    Patient Stated Goals less pain, move my arms    Currently in Pain? No/denies                             [facility] Adult PT Treatment/Exercise - 03/22/21 0001      Neuro Re-ed    Neuro Re-ed Details  In // bars: Front and retro heel-toe walking 4x encouraging pt to look forward, not down at feet and work on erect posture; bil crossover Rt and Lt 2x each way able to do with only +1 HHA; slow and controlled high knee marching able to do with +1 HHA      Knee/Hip Exercises: Standing   Hip Flexion Stengthening;Right;Left;2 sets;15 reps;Knee straight   2# added to each ankle   Hip Flexion Limitations +2 HHA at back of bike, seated rest between sets for seated exs    Hip Abduction Stengthening;Right;Left;2 sets;15 reps   2# added each ankle   Abduction Limitations On airex at back  of bike for +2 HHA    Hip Extension Stengthening;Right;Left;2 sets;15 reps   2# added to each ankle   Extension Limitations On Airex at back of bike for +2 HHA      Knee/Hip Exercises: Seated   Long Arc Quad Strengthening;Both;15 reps;Weights    Long Arc Quad Weight 2 lbs.    Marching Strengthening;Both;15 reps   2# added to each ankle   Sit to Sand 10 reps;without UE support      Shoulder Exercises: Standing   Other Standing Exercises Bil UE 3 way raises holding 2# into flexion, scaption and abduction x10 each encouraging erect posture as much as able due to postural changes    Other Standing Exercises Bicep curls 2# x20 standing against wall as much as able      Shoulder Exercises: Pulleys   Flexion 2 minutes    Flexion Limitations VCs to relax shoulders    ABduction 2 minutes    ABduction Limitations VCs for slower pace, pt repors this feels very good at her  tight Lt shoulder                       PT Long Term Goals - 03/17/21 1114      PT LONG TERM GOAL #1   Title Pt will report improved ability to wash in the tub by at least 50%    Baseline pt reports improved ability to wash the back    Status Achieved      PT LONG TERM GOAL #2   Title Pt improve shoulder flexion bil to at least 135 to demonstrate improved reach    Baseline Rt: 90,  Lt: 130, bil 130 on 03/17/21    Status Partially Met      PT LONG TERM GOAL #3   Title Pt will be ind with HEP for continued strength and mobility    Status On-going      PT LONG TERM GOAL #4   Title pt will be able to maintain tandem stance for 10 seconds without hand hold and without LOB    Baseline 03/17/21: Rt forward 10" with no step assist    Lt: 10" with assist for foot placement    Status Achieved      PT LONG TERM GOAL #5   Title Pt will improve single leg stance time to 5 seconds on each leg without hand support to lessen fall risk    Time 6    Period Weeks    Status New    Target Date 04/28/21      Additional Long Term Goals   Additional Long Term Goals Yes      PT LONG TERM GOAL #6   Title Pt will perform 5x sit to stand in less then 17 seconds    Time 6    Period Weeks    Status New                 Plan - 03/22/21 1146    Clinical Impression Statement Progressed pt to include ankle weights or dumbells for most of strengthening activities and done in circuit style today. Then continued balance activities in // bars which she was challenged by but did well with. Pt feels encouraged after each session and reports feeling stronger.    Personal Factors and Comorbidities Age;Fitness;Time since onset of injury/illness/exacerbation;Comorbidity 3+    Comorbidities cancer history Rt breast, CKD, DM, OA status    Examination-Activity Limitations Lift;Bathing;Toileting;Hygiene/Grooming;Carry  Examination-Participation Restrictions Cleaning;Meal Prep;Yard Work;Community  Activity    Stability/Clinical Decision Making Stable/Uncomplicated    Rehab Potential Fair    PT Frequency 2x / week    PT Duration 4 weeks    PT Treatment/Interventions ADLs/Self Care Home Management;Therapeutic exercise;Patient/family education;Manual lymph drainage;Manual techniques;Gait training;Passive range of motion    PT Next Visit Plan add more single leg work, general exercise/mobility for UE/LE, sit to stand, limit by pain response    PT Home Exercise Plan Access Code: [email or web address]; standing and seated bil LE strength and balance static activities    Consulted and Agree with Plan of Care Patient           Patient will benefit from skilled therapeutic intervention in order to improve the following deficits and impairments:  Pain,Difficulty walking,Decreased balance,Decreased safety awareness,Decreased activity tolerance,Decreased range of motion,Decreased strength,Impaired UE functional use  Visit Diagnosis: Chronic left shoulder pain  Chronic right shoulder pain  Neck pain  Other abnormalities of gait and mobility     Problem List Patient Active Problem List   Diagnosis Date Noted  . Osteopenia 01/13/2015  . Uterine cancer ([place])   . Breast cancer of upper-outer quadrant of right female breast ([place]) 11/06/2014    Otelia Limes, PTA 03/22/2021, 12:12 PM  [place], [place], [address] Phone: [phone]   Fax:  [phone]  Name: Erica Whitney MRN: [id number] Date of Birth: 07/14/1941

## 2021-03-24 ENCOUNTER — Other Ambulatory Visit: Payer: Self-pay

## 2021-03-24 ENCOUNTER — Ambulatory Visit: Payer: Medicare Other

## 2021-03-24 DIAGNOSIS — R2689 Other abnormalities of gait and mobility: Secondary | ICD-10-CM

## 2021-03-24 DIAGNOSIS — M25512 Pain in left shoulder: Secondary | ICD-10-CM

## 2021-03-24 DIAGNOSIS — G8929 Other chronic pain: Secondary | ICD-10-CM

## 2021-03-24 DIAGNOSIS — M542 Cervicalgia: Secondary | ICD-10-CM

## 2021-03-24 NOTE — Therapy (Signed)
[place], [place], [address] Phone: [phone]   Fax:  [phone]  Physical Therapy Treatment  Patient Details  Name: Erica Whitney MRN: [id number] Date of Birth: [birth year] Referring Provider (PT): Dr. Burr Medico   Encounter Date: 03/24/2021   PT End of Session - 03/24/21 1216    Visit Number 7    Number of Visits 8    Date for PT Re-Evaluation 04/28/21    Authorization Type MCR    Progress Note Due on Visit 10    PT Start Time 1105    PT Stop Time 1205    PT Time Calculation (min) 60 min    Activity Tolerance Patient tolerated treatment well    Behavior During Therapy [facility] for tasks assessed/performed           Past Medical History:  Diagnosis Date  . Arthritis   . Breast cancer ([place])   . Breast cancer of upper-outer quadrant of right female breast ([place]) 11/06/2014  . Depression   . Diabetes mellitus   . Hypercholesteremia   . Hypertension   . Insomnia   . Peptic ulcer   . Uterine cancer ([place])    dx in her 15s  . Wears glasses     Past Surgical History:  Procedure Laterality Date  . ABDOMINAL HYSTERECTOMY  1995  . BACK SURGERY  2000   lumb lam  . BREAST LUMPECTOMY Right 2015  . COLONOSCOPY    . ORIF METACARPAL FRACTURE  2012   left  . RADIOACTIVE SEED GUIDED PARTIAL MASTECTOMY WITH AXILLARY SENTINEL LYMPH NODE BIOPSY Right 12/17/2014   Procedure: RIGHT BREAST RADIOACTIVE SEED LOCALIZED LUMPECTOMY AND SENTINEL NODE MAPPING;  Surgeon: Autumn Messing III, MD;  Location: [place];  Service: General;  Laterality: Right;    There were no vitals filed for this visit.   Subjective Assessment - 03/24/21 1108    Subjective I felt so good after my last session. I always feel looser too in my body. My shoulders are normally so tight.    Pertinent History Treatment for right breast cancer completed in 2015 with lumpectomy/SLNB, antiestrogen x 5 years with NED.  Recent bone  scan showing scoliosis, degenerative changes in the thoracic and lumbar spine and feet.  Other history includes DM, HTN, uterine cancer, lumbar laminectomy in 2000, CKD    Patient Stated Goals less pain, move my arms    Currently in Pain? No/denies                             [facility] Adult PT Treatment/Exercise - 03/24/21 0001      Self-Care   Self-Care Other Self-Care Comments    Other Self-Care Comments  Spent time educating pt about ways to get off floor if she has another fall by demonstration and explanation. Then had pt return demo of getting down to floor into tall kneel with use of chair and to stand back up from there independently. Also educated her how to scoot on her butt to a chair or other solid object that she could use to stand up from depending on where she was in the house in the case of a fall. Pt able to verbalize good understanding and felt very encouraged that she was able to stand up from tall kneel position.      Neuro Re-ed    Neuro Re-ed Details  In hallway practiced front and retro  heel-toe gait with +1 HHA on wall for front, wall and +2 HHA for retro with wall and therapist hand hold. Pt slow with retro and nervous about falling but did well with this      Knee/Hip Exercises: Standing   Hip Flexion Stengthening;Right;Left;Knee straight;20 reps   2# each ankle on Airex atback of bike for +2 HHA   Hip Abduction Stengthening;Right;Left;20 reps;Knee straight   2# each ankle on Airex at back of bike for +2 HHA   Hip Extension Stengthening;Right;Left;20 reps;Knee straight   2# each ankle on Airex at back of bike for +2 HHA     Shoulder Exercises: Seated   Other Seated Exercises Bil bicep curls with 3# x25 reps; then [facility] presses with 2# x15 each      Shoulder Exercises: Pulleys   Flexion 2 minutes    ABduction 2 minutes    ABduction Limitations VCs and demo to remind pt of correct technique                       PT Long Term Goals -  03/17/21 1114      PT LONG TERM GOAL #1   Title Pt will report improved ability to wash in the tub by at least 50%    Baseline pt reports improved ability to wash the back    Status Achieved      PT LONG TERM GOAL #2   Title Pt improve shoulder flexion bil to at least 135 to demonstrate improved reach    Baseline Rt: 90,  Lt: 130, bil 130 on 03/17/21    Status Partially Met      PT LONG TERM GOAL #3   Title Pt will be ind with HEP for continued strength and mobility    Status On-going      PT LONG TERM GOAL #4   Title pt will be able to maintain tandem stance for 10 seconds without hand hold and without LOB    Baseline 03/17/21: Rt forward 10" with no step assist    Lt: 10" with assist for foot placement    Status Achieved      PT LONG TERM GOAL #5   Title Pt will improve single leg stance time to 5 seconds on each leg without hand support to lessen fall risk    Time 6    Period Weeks    Status New    Target Date 04/28/21      Additional Long Term Goals   Additional Long Term Goals Yes      PT LONG TERM GOAL #6   Title Pt will perform 5x sit to stand in less then 17 seconds    Time 6    Period Weeks    Status New                 Plan - 03/24/21 1218    Clinical Impression Statement Continued with use of ankle weights and dumbells as pt really likes challenge of these. Also spent time today discussing ways to get off floor if she falls again. Educated and demonstrated ways she could get to a steady object, like dining room chair or coffee table, and that she could butt scoot or maybe even crawl to these surfaces. Then had pt get down to tall kneel and back up from there with use of chair to simultate this at home with her dining room chair. Pt was able to do this with very little  difficulty and she reported feeling very encouraged by this. Pt is progressing well with reports of feeling more confident with community ambulation and she is ambulating with a more erect posture  now as well.    Personal Factors and Comorbidities Age;Fitness;Time since onset of injury/illness/exacerbation;Comorbidity 3+    Comorbidities cancer history Rt breast, CKD, DM, OA status    Examination-Activity Limitations Lift;Bathing;Toileting;Hygiene/Grooming;Carry    Examination-Participation Restrictions Cleaning;Meal Prep;Yard Work;Community Activity    Stability/Clinical Decision Making Stable/Uncomplicated    Rehab Potential Fair    PT Frequency 2x / week    PT Duration 4 weeks    PT Treatment/Interventions ADLs/Self Care Home Management;Therapeutic exercise;Patient/family education;Manual lymph drainage;Manual techniques;Gait training;Passive range of motion    PT Next Visit Plan add more single leg work, general exercise/mobility for UE/LE, sit to stand, limit by pain response; see if pt needs/wants to problem solve any further getting up from floor    PT Home Exercise Plan Access Code: [email or web address]; standing and seated bil LE strength and balance static activities    Consulted and Agree with Plan of Care Patient           Patient will benefit from skilled therapeutic intervention in order to improve the following deficits and impairments:  Pain,Difficulty walking,Decreased balance,Decreased safety awareness,Decreased activity tolerance,Decreased range of motion,Decreased strength,Impaired UE functional use  Visit Diagnosis: Chronic left shoulder pain  Chronic right shoulder pain  Neck pain  Other abnormalities of gait and mobility     Problem List Patient Active Problem List   Diagnosis Date Noted  . Osteopenia 01/13/2015  . Uterine cancer ([place])   . Breast cancer of upper-outer quadrant of right female breast ([place]) 11/06/2014    Otelia Limes, PTA 03/24/2021, 12:31 PM  [place], [place], [address] Phone: [phone]   Fax:  [phone]  Name: Erica Whitney MRN:  [id number] Date of Birth: Feb 02, 1941

## 2021-03-29 ENCOUNTER — Encounter: Payer: Self-pay | Admitting: Rehabilitation

## 2021-03-29 ENCOUNTER — Ambulatory Visit: Payer: Medicare Other | Admitting: Rehabilitation

## 2021-03-29 ENCOUNTER — Other Ambulatory Visit: Payer: Self-pay

## 2021-03-29 DIAGNOSIS — M25511 Pain in right shoulder: Secondary | ICD-10-CM

## 2021-03-29 DIAGNOSIS — G8929 Other chronic pain: Secondary | ICD-10-CM

## 2021-03-29 DIAGNOSIS — M542 Cervicalgia: Secondary | ICD-10-CM

## 2021-03-29 DIAGNOSIS — R2689 Other abnormalities of gait and mobility: Secondary | ICD-10-CM

## 2021-03-29 DIAGNOSIS — M25512 Pain in left shoulder: Secondary | ICD-10-CM | POA: Diagnosis not present

## 2021-03-29 NOTE — Therapy (Signed)
[place], [place], [address] Phone: [phone]   Fax:  [phone]  Physical Therapy Treatment  Patient Details  Name: Erica Whitney MRN: [id number] Date of Birth: [birth year] Referring Provider (PT): Dr. Burr Medico   Encounter Date: 03/29/2021   PT End of Session - 03/29/21 1353    Visit Number 8    Number of Visits 12    Progress Note Due on Visit 10    PT Start Time 1304    PT Stop Time 1352    PT Time Calculation (min) 48 min    Activity Tolerance Patient tolerated treatment well    Behavior During Therapy [facility] for tasks assessed/performed           Past Medical History:  Diagnosis Date  . Arthritis   . Breast cancer ([place])   . Breast cancer of upper-outer quadrant of right female breast ([place]) 11/06/2014  . Depression   . Diabetes mellitus   . Hypercholesteremia   . Hypertension   . Insomnia   . Peptic ulcer   . Uterine cancer ([place])    dx in her 11s  . Wears glasses     Past Surgical History:  Procedure Laterality Date  . ABDOMINAL HYSTERECTOMY  1995  . BACK SURGERY  2000   lumb lam  . BREAST LUMPECTOMY Right 2015  . COLONOSCOPY    . ORIF METACARPAL FRACTURE  2012   left  . RADIOACTIVE SEED GUIDED PARTIAL MASTECTOMY WITH AXILLARY SENTINEL LYMPH NODE BIOPSY Right 12/17/2014   Procedure: RIGHT BREAST RADIOACTIVE SEED LOCALIZED LUMPECTOMY AND SENTINEL NODE MAPPING;  Surgeon: Autumn Messing III, MD;  Location: [place];  Service: General;  Laterality: Right;    There were no vitals filed for this visit.   Subjective Assessment - 03/29/21 1304    Subjective I kind of turned my right ankle walking yesterday but its fine    Pertinent History Treatment for right breast cancer completed in 2015 with lumpectomy/SLNB, antiestrogen x 5 years with NED.  Recent bone scan showing scoliosis, degenerative changes in the thoracic and lumbar spine and feet.  Other history includes  DM, HTN, uterine cancer, lumbar laminectomy in 2000, CKD    Currently in Pain? No/denies                             [facility] Adult PT Treatment/Exercise - 03/29/21 0001      Ambulation/Gait   Gait Comments amb around 656f without AD with vcs to stand upright as much as possible.      Neuro Re-ed    Neuro Re-ed Details  in hallway one foot steps across ladder x 4 CGA and side steps 2 HHA x 2 lengths      Exercises   Exercises Other Exercises    Other Exercises  discussed home exercise options including the gym or online videos which pt was interested in but has no access to a smart device.  Discussed talking to daughter about some way she could access online      Knee/Hip Exercises: Standing   Heel Raises Both;10 reps    Other Standing Knee Exercises standing on airex weight shift Rt/Lt x 10 with CGA, minA for step up and back      Knee/Hip Exercises: Seated   Long Arc Quad Both;15 reps    Long Arc Quad Weight 2 lbs.    Ball Squeeze 10  Clamshell with TheraBand Red   x 10   Marching Strengthening;Both;15 reps    Marching Limitations added opposite arm raise    Marching Weights 2 lbs.    Sit to Sand 10 reps;without UE support      Shoulder Exercises: Standing   Other Standing Exercises shoulder press 2# 2x3 standing on airex    Other Standing Exercises Bicep curls 2# x20 standing on airex CGA      Shoulder Exercises: Pulleys   Flexion 2 minutes    ABduction 2 minutes                       PT Long Term Goals - 03/17/21 1114      PT LONG TERM GOAL #1   Title Pt will report improved ability to wash in the tub by at least 50%    Baseline pt reports improved ability to wash the back    Status Achieved      PT LONG TERM GOAL #2   Title Pt improve shoulder flexion bil to at least 135 to demonstrate improved reach    Baseline Rt: 90,  Lt: 130, bil 130 on 03/17/21    Status Partially Met      PT LONG TERM GOAL #3   Title Pt will be ind with  HEP for continued strength and mobility    Status On-going      PT LONG TERM GOAL #4   Title pt will be able to maintain tandem stance for 10 seconds without hand hold and without LOB    Baseline 03/17/21: Rt forward 10" with no step assist    Lt: 10" with assist for foot placement    Status Achieved      PT LONG TERM GOAL #5   Title Pt will improve single leg stance time to 5 seconds on each leg without hand support to lessen fall risk    Time 6    Period Weeks    Status New    Target Date 04/28/21      Additional Long Term Goals   Additional Long Term Goals Yes      PT LONG TERM GOAL #6   Title Pt will perform 5x sit to stand in less then 17 seconds    Time 6    Period Weeks    Status New                 Plan - 03/29/21 1354    Clinical Impression Statement Pt continues to like to exercise and use resistance at PT.  Added more balance work today on foam which was difficult as well as ladder steps with most LOB to the Rt.    PT Frequency 2x / week    PT Duration 6 weeks    PT Treatment/Interventions ADLs/Self Care Home Management;Therapeutic exercise;Patient/family education;Manual lymph drainage;Manual techniques;Gait training;Passive range of motion    PT Next Visit Plan add more single leg work, general exercise/mobility for UE/LE, sit to stand, limit by pain response; see if pt needs/wants to problem solve any further getting up from floor    Consulted and Agree with Plan of Care Patient           Patient will benefit from skilled therapeutic intervention in order to improve the following deficits and impairments:     Visit Diagnosis: Chronic left shoulder pain  Chronic right shoulder pain  Neck pain  Other abnormalities of gait and mobility  Problem List Patient Active Problem List   Diagnosis Date Noted  . Osteopenia 01/13/2015  . Uterine cancer ([place])   . Breast cancer of upper-outer quadrant of right female breast ([place]) 11/06/2014    Stark Bray 03/29/2021, 1:56 PM  [place], [place], [address] Phone: [phone]   Fax:  [phone]  Name: Erica Whitney MRN: [id number] Date of Birth: [birth year]

## 2021-03-31 ENCOUNTER — Ambulatory Visit: Payer: Medicare Other

## 2021-03-31 ENCOUNTER — Other Ambulatory Visit: Payer: Self-pay

## 2021-03-31 DIAGNOSIS — G8929 Other chronic pain: Secondary | ICD-10-CM

## 2021-03-31 DIAGNOSIS — R2689 Other abnormalities of gait and mobility: Secondary | ICD-10-CM

## 2021-03-31 DIAGNOSIS — M25512 Pain in left shoulder: Secondary | ICD-10-CM

## 2021-03-31 DIAGNOSIS — M542 Cervicalgia: Secondary | ICD-10-CM

## 2021-03-31 NOTE — Therapy (Signed)
[place], [place], [address] Phone: [phone]   Fax:  [phone]  Physical Therapy Treatment  Patient Details  Name: Erica Whitney MRN: [id number] Date of Birth: [birth year] Referring Provider (PT): Dr. Burr Medico   Encounter Date: 03/31/2021   PT End of Session - 03/31/21 1006    Visit Number 9    Number of Visits 12    Date for PT Re-Evaluation 04/28/21    Authorization Type MCR    PT Start Time 0906    PT Stop Time 1004    PT Time Calculation (min) 58 min    Activity Tolerance Patient tolerated treatment well    Behavior During Therapy [facility] for tasks assessed/performed           Past Medical History:  Diagnosis Date  . Arthritis   . Breast cancer ([place])   . Breast cancer of upper-outer quadrant of right female breast ([place]) 11/06/2014  . Depression   . Diabetes mellitus   . Hypercholesteremia   . Hypertension   . Insomnia   . Peptic ulcer   . Uterine cancer ([place])    dx in her 1s  . Wears glasses     Past Surgical History:  Procedure Laterality Date  . ABDOMINAL HYSTERECTOMY  1995  . BACK SURGERY  2000   lumb lam  . BREAST LUMPECTOMY Right 2015  . COLONOSCOPY    . ORIF METACARPAL FRACTURE  2012   left  . RADIOACTIVE SEED GUIDED PARTIAL MASTECTOMY WITH AXILLARY SENTINEL LYMPH NODE BIOPSY Right 12/17/2014   Procedure: RIGHT BREAST RADIOACTIVE SEED LOCALIZED LUMPECTOMY AND SENTINEL NODE MAPPING;  Surgeon: Autumn Messing III, MD;  Location: [place];  Service: General;  Laterality: Right;    There were no vitals filed for this visit.   Subjective Assessment - 03/31/21 0911    Subjective I'm doing good. I like coming here, I've been feeling stronger.    Pertinent History Treatment for right breast cancer completed in 2015 with lumpectomy/SLNB, antiestrogen x 5 years with NED.  Recent bone scan showing scoliosis, degenerative changes in the thoracic and lumbar spine and  feet.  Other history includes DM, HTN, uterine cancer, lumbar laminectomy in 2000, CKD    Patient Stated Goals less pain, move my arms    Currently in Pain? No/denies                             [facility] Adult PT Treatment/Exercise - 03/31/21 0001      Neuro Re-ed    Neuro Re-ed Details  In hallway with fingertips on wall: Slow and controlled high knee marching 2x, front and retro and heel-toe walking also holding therapist hand as well, then crossover in front (tried [place] but pt confused about posterior foot placement so only did crossover in front) 1x each side      Knee/Hip Exercises: Aerobic   Nustep Level 6, x 10 minutes with therapist monitoring pt throughout      Knee/Hip Exercises: Standing   Hip Flexion Stengthening;Right;Left;15 reps   2# each ankle, on airex at back of bike for +2 HHA   Hip Abduction Stengthening;Right;Left;15 reps   2# each ankle, on airex at back of bike for +2 HHA   Hip Extension Stengthening;Right;Left;15 reps   2# each ankle, on airex at back of bike for +2 HHA   Forward Step Up Right;Left;2 sets;10 reps;Hand Hold: 2;Step Height: 4"  airex on 4" step at back of bike   Forward Step Up Limitations pt returned therapist demo      Shoulder Exercises: Seated   Other Seated Exercises bicep curls 3# x 20, overhead alternating press 2# x 10      Shoulder Exercises: Standing   External Rotation Strengthening;Both;15 reps;Theraband   standing on airex   Theraband Level (Shoulder External Rotation) Level 1 (Yellow)    External Rotation Limitations Tactile and demo for correct technique    Extension Strengthening;Both;15 reps;Theraband   standing on airex at back of bike   Theraband Level (Shoulder Extension) Level 3 (Green)    Extension Limitations Pt returned therapist    Row Strengthening;Both;15 reps;Theraband   standing on airex at back of bike   Theraband Level (Shoulder Row) Level 3 (Green)    Row Limitations VCs to keep core engaged  for stability      Shoulder Exercises: Pulleys   Flexion 2 minutes    Flexion Limitations VCs to relax shoulders    ABduction 2 minutes    ABduction Limitations Tactile cues to decrease bil scapular compensations                       PT Long Term Goals - 03/17/21 1114      PT LONG TERM GOAL #1   Title Pt will report improved ability to wash in the tub by at least 50%    Baseline pt reports improved ability to wash the back    Status Achieved      PT LONG TERM GOAL #2   Title Pt improve shoulder flexion bil to at least 135 to demonstrate improved reach    Baseline Rt: 90,  Lt: 130, bil 130 on 03/17/21    Status Partially Met      PT LONG TERM GOAL #3   Title Pt will be ind with HEP for continued strength and mobility    Status On-going      PT LONG TERM GOAL #4   Title pt will be able to maintain tandem stance for 10 seconds without hand hold and without LOB    Baseline 03/17/21: Rt forward 10" with no step assist    Lt: 10" with assist for foot placement    Status Achieved      PT LONG TERM GOAL #5   Title Pt will improve single leg stance time to 5 seconds on each leg without hand support to lessen fall risk    Time 6    Period Weeks    Status New    Target Date 04/28/21      Additional Long Term Goals   Additional Long Term Goals Yes      PT LONG TERM GOAL #6   Title Pt will perform 5x sit to stand in less then 17 seconds    Time 6    Period Weeks    Status New                 Plan - 03/31/21 1006    Clinical Impression Statement Continued with progression of bil LE strength and balance exercises today. Progressed to with NuStep which she enjoyed and was able to do for 10 mins at resistance level 6. Also continued with added resistance to ankles for bil LE exs and practiced balance with fingertip support to SBA in hall which pt was challenged by.    Personal Factors and Comorbidities Age;Fitness;Time since onset of  injury/illness/exacerbation;Comorbidity 3+    Comorbidities cancer history Rt breast, CKD, DM, OA status    Examination-Activity Limitations Lift;Bathing;Toileting;Hygiene/Grooming;Carry    Examination-Participation Restrictions Cleaning;Meal Prep;Yard Work;Community Activity    Stability/Clinical Decision Making Stable/Uncomplicated    Rehab Potential Fair    PT Frequency 2x / week    PT Duration 6 weeks    PT Treatment/Interventions ADLs/Self Care Home Management;Therapeutic exercise;Patient/family education;Manual lymph drainage;Manual techniques;Gait training;Passive range of motion    PT Next Visit Plan add more single leg work, general exercise/mobility for UE/LE, sit to stand, limit by pain response    PT Home Exercise Plan Access Code: [email or web address]; standing and seated bil LE strength and balance static activities    Consulted and Agree with Plan of Care Patient           Patient will benefit from skilled therapeutic intervention in order to improve the following deficits and impairments:  Pain,Difficulty walking,Decreased balance,Decreased safety awareness,Decreased activity tolerance,Decreased range of motion,Decreased strength,Impaired UE functional use  Visit Diagnosis: Chronic left shoulder pain  Chronic right shoulder pain  Neck pain  Other abnormalities of gait and mobility     Problem List Patient Active Problem List   Diagnosis Date Noted  . Osteopenia 01/13/2015  . Uterine cancer ([place])   . Breast cancer of upper-outer quadrant of right female breast ([place]) 11/06/2014    Otelia Limes, PTA 03/31/2021, 12:18 PM  [place], [place], [address] Phone: [phone]   Fax:  [phone]  Name: Erica Whitney MRN: [id number] Date of Birth: September 08, 1941

## 2021-04-04 ENCOUNTER — Ambulatory Visit: Payer: Medicare Other

## 2021-04-04 ENCOUNTER — Other Ambulatory Visit: Payer: Self-pay

## 2021-04-04 DIAGNOSIS — R2689 Other abnormalities of gait and mobility: Secondary | ICD-10-CM

## 2021-04-04 DIAGNOSIS — M25512 Pain in left shoulder: Secondary | ICD-10-CM

## 2021-04-04 DIAGNOSIS — G8929 Other chronic pain: Secondary | ICD-10-CM

## 2021-04-04 DIAGNOSIS — M542 Cervicalgia: Secondary | ICD-10-CM

## 2021-04-04 DIAGNOSIS — M25511 Pain in right shoulder: Secondary | ICD-10-CM

## 2021-04-04 NOTE — Therapy (Addendum)
Progress Note Reporting Period 02/18/32 to 04/04/21  See note below for Objective Data and Assessment of Progress/Goals.   Allyson Sabal [place], [place] 04/04/21 2:11 PM    [place], [place], [address] Phone: [phone]   Fax:  [phone]  Physical Therapy Treatment  Patient Details  Name: KEYUNDRA FANT MRN: [id number] Date of Birth: 1941/09/26 Referring Provider (PT): Dr. Burr Medico   Encounter Date: 04/04/2021   PT End of Session - 04/04/21 1050    Visit Number 10    Number of Visits 12    Date for PT Re-Evaluation 04/28/21    Authorization Type MCR    Progress Note Due on Visit 10    PT Start Time 1011    PT Stop Time 1105    PT Time Calculation (min) 54 min           Past Medical History:  Diagnosis Date  . Arthritis   . Breast cancer ([facility])   . Breast cancer of upper-outer quadrant of right female breast ([facility]) 11/06/2014  . Depression   . Diabetes mellitus   . Hypercholesteremia   . Hypertension   . Insomnia   . Peptic ulcer   . Uterine cancer ([facility])    dx in her 74s  . Wears glasses     Past Surgical History:  Procedure Laterality Date  . ABDOMINAL HYSTERECTOMY  1995  . BACK SURGERY  2000   lumb lam  . BREAST LUMPECTOMY Right 2015  . COLONOSCOPY    . ORIF METACARPAL FRACTURE  2012   left  . RADIOACTIVE SEED GUIDED PARTIAL MASTECTOMY WITH AXILLARY SENTINEL LYMPH NODE BIOPSY Right 12/17/2014   Procedure: RIGHT BREAST RADIOACTIVE SEED LOCALIZED LUMPECTOMY AND SENTINEL NODE MAPPING;  Surgeon: Autumn Messing III, MD;  Location: [facility];  Service: General;  Laterality: Right;    There were no vitals filed for this visit.   Subjective Assessment - 04/04/21 1015    Subjective I feel fine. I can tell I'm getting stronger.    Pertinent History Treatment for right breast cancer completed in 2015 with lumpectomy/SLNB, antiestrogen x 5 years with NED.  Recent bone scan  showing scoliosis, degenerative changes in the thoracic and lumbar spine and feet.  Other history includes DM, HTN, uterine cancer, lumbar laminectomy in 2000, CKD    Patient Stated Goals less pain, move my arms    Currently in Pain? No/denies              [facility] PT Assessment - 04/04/21 0001      AROM   Right Shoulder Flexion 142 Degrees    Right Shoulder ABduction 114 Degrees    Left Shoulder Flexion 148 Degrees   no pulling in arm now   Left Shoulder ABduction 126 Degrees                         [facility] Adult PT Treatment/Exercise - 04/04/21 0001      Knee/Hip Exercises: Aerobic   Nustep Level 6, x 10 minutes with therapist monitoring pt throughout      Knee/Hip Exercises: Standing   Forward Step Up Right;Left;2 sets;10 reps;Hand Hold: 2;Step Height: 6"   2# added to each ankle; 2nd set done circuit   Forward Step Up Limitations pt returned therapist demo    [place] 4 sets;5 reps   then 4 x 5 reps of bicep curls with 3# between each set  Wall Squat Limitations Did above 2 sets of each so total of 8 x 5 but done in circuit style; Therapist demo initally then pt able to return demo      Shoulder Exercises: Standing   Extension Strengthening;Both;20 reps;Theraband   2x20 reps done in circuit style   Theraband Level (Shoulder Extension) Level 2 (Red)    Extension Limitations VCs for scapular engagement at end of motion    Row Right;Both;20 reps;Theraband   2x20 reps done in circuit style   Theraband Level (Shoulder Row) Level 2 (Red)    Row Limitations VCs for scapular engagement at end of motion      Shoulder Exercises: Pulleys   Flexion 2 minutes   added 1 lb to wrists   ABduction 2 minutes   addedd 1 lb to wrists   ABduction Limitations VCs to decrease side trunk lean                       PT Long Term Goals - 04/04/21 1050      PT LONG TERM GOAL #1   Title Pt will report improved ability to wash in the tub by at least 50%    Baseline pt  reports improved ability to wash the back    Status Achieved      PT LONG TERM GOAL #2   Title Pt improve shoulder flexion bil to at least 135 to demonstrate improved reach    Baseline Rt: 90,  Lt: 130, bil 130 on 03/17/21; Rt 142 and Lt 148 degrees - 04/04/21    Status Achieved      PT LONG TERM GOAL #3   Title Pt will be ind with HEP for continued strength and mobility    Baseline Pt is indepedent with current HEP and reports feeling more stable ambulating around her house and using her cane less-04/04/21    Status On-going      PT LONG TERM GOAL #4   Title pt will be able to maintain tandem stance for 10 seconds without hand hold and without LOB    Baseline 03/17/21: Rt forward 10" with no step assist    Lt: 10" with assist for foot placement    Status Achieved      PT LONG TERM GOAL #5   Title Pt will improve single leg stance time to 5 seconds on each leg without hand support to lessen fall risk    Baseline Pt able to attain ~2 sec each leg - 04/04/21    Status On-going      PT LONG TERM GOAL #6   Title Pt will perform 5x sit to stand in less then 17 seconds    Baseline Able to do 5 reps in 15 sec with no UE support and pt repotrs able to stand up without UE support at home now as well - 04/04/21    Status Achieved                 Plan - 04/04/21 1104    Clinical Impression Statement 10 th visit note sent to MD today. Pt is making excellent progress towards goals and has met most of them (see goals). She still struggles with SLS so will continue to incorporate this into her bil LE strength and balance activities during sessions. Today did circuit style with all standing exercises and pt was challenged by these but only required 1 seated rest break during. She reports is now able to ambulate  more frequently without her cane at home.    Personal Factors and Comorbidities Age;Fitness;Time since onset of injury/illness/exacerbation;Comorbidity 3+    Comorbidities cancer history Rt  breast, CKD, DM, OA status    Examination-Activity Limitations Lift;Bathing;Toileting;Hygiene/Grooming;Carry    Examination-Participation Restrictions Cleaning;Meal Prep;Yard Work;Community Activity    Rehab Potential Fair    PT Frequency 2x / week    PT Duration 6 weeks    PT Treatment/Interventions ADLs/Self Care Home Management;Therapeutic exercise;Patient/family education;Manual lymph drainage;Manual techniques;Gait training;Passive range of motion    PT Next Visit Plan add more single leg work, general exercise/mobility for UE/LE and cont circuit style, sit to stand, limit by pain response    PT Home Exercise Plan Access Code: [email or web address]; standing and seated bil LE strength and balance static activities    Consulted and Agree with Plan of Care Patient           Patient will benefit from skilled therapeutic intervention in order to improve the following deficits and impairments:  Pain,Difficulty walking,Decreased balance,Decreased safety awareness,Decreased activity tolerance,Decreased range of motion,Decreased strength,Impaired UE functional use  Visit Diagnosis: Chronic left shoulder pain  Chronic right shoulder pain  Neck pain  Other abnormalities of gait and mobility     Problem List Patient Active Problem List   Diagnosis Date Noted  . Osteopenia 01/13/2015  . Uterine cancer ([facility])   . Breast cancer of upper-outer quadrant of right female breast ([facility]) 11/06/2014    Otelia Limes, PTA 04/04/2021, 12:25 PM  [place], [place], [address] Phone: [phone]   Fax:  [phone]  Name: GRAZIA TAFFE MRN: [id number] Date of Birth: 1941/09/03

## 2021-04-08 ENCOUNTER — Other Ambulatory Visit: Payer: Self-pay | Admitting: Nephrology

## 2021-04-08 DIAGNOSIS — N179 Acute kidney failure, unspecified: Secondary | ICD-10-CM

## 2021-04-12 ENCOUNTER — Other Ambulatory Visit: Payer: Self-pay

## 2021-04-12 ENCOUNTER — Ambulatory Visit: Payer: Medicare Other | Admitting: Rehabilitation

## 2021-04-12 ENCOUNTER — Encounter: Payer: Self-pay | Admitting: Rehabilitation

## 2021-04-12 DIAGNOSIS — M25512 Pain in left shoulder: Secondary | ICD-10-CM

## 2021-04-12 DIAGNOSIS — R2689 Other abnormalities of gait and mobility: Secondary | ICD-10-CM

## 2021-04-12 DIAGNOSIS — M542 Cervicalgia: Secondary | ICD-10-CM

## 2021-04-12 DIAGNOSIS — G8929 Other chronic pain: Secondary | ICD-10-CM

## 2021-04-12 NOTE — Therapy (Signed)
[facility] [facility] [address] [place], [place], [address] Phone: [phone]   Fax:  [phone]  Physical Therapy Treatment  Patient Details  Name: Erica Whitney MRN: [id number] Date of Birth: 02/12/1941 Referring Provider (PT): Dr. Mosetta Putt   Encounter Date: 04/12/2021   PT End of Session - 04/12/21 1159    Visit Number 11    Date for PT Re-Evaluation 04/28/21    Progress Note Due on Visit 10    PT Start Time 1100    PT Stop Time 1155    PT Time Calculation (min) 55 min    Activity Tolerance Patient tolerated treatment well    Behavior During Therapy [facility] for tasks assessed/performed           Past Medical History:  Diagnosis Date  . Arthritis   . Breast cancer ([facility])   . 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  . Depression   . Diabetes mellitus   . Hypercholesteremia   . Hypertension   . Insomnia   . Peptic ulcer   . Uterine cancer ([facility])    dx in her 35s  . Wears glasses     Past Surgical History:  Procedure Laterality Date  . ABDOMINAL HYSTERECTOMY  1995  . BACK SURGERY  2000   lumb lam  . BREAST LUMPECTOMY Right 2015  . COLONOSCOPY    . ORIF METACARPAL FRACTURE  2012   left  . RADIOACTIVE SEED GUIDED PARTIAL MASTECTOMY WITH AXILLARY SENTINEL LYMPH NODE BIOPSY Right 12/17/2014   Procedure: RIGHT BREAST RADIOACTIVE SEED LOCALIZED LUMPECTOMY AND SENTINEL NODE MAPPING;  Surgeon: Chevis Pretty III, MD;  Location: [facility];  Service: General;  Laterality: Right;    There were no vitals filed for this visit.   Subjective Assessment - 04/12/21 1057    Subjective Im still good! I felt great after last time    Pertinent History Treatment for right breast cancer completed in 2015 with lumpectomy/SLNB, antiestrogen x 5 years with NED.  Recent bone scan showing scoliosis, degenerative changes in the thoracic and lumbar spine and feet.  Other history includes DM,  HTN, uterine cancer, lumbar laminectomy in 2000, CKD    Currently in Pain? No/denies                             [facility] Adult PT Treatment/Exercise - 04/12/21 0001      Ambulation/Gait   Gait Comments amb around 644ft in clinic      Neuro Re-ed    Neuro Re-ed Details  side steps in hallway CGA 2x10 steps, SL stance work at the side of the TM      Knee/Hip Exercises: Aerobic   Nustep Level 6, x 10 minutes with therapist monitoring pt throughout      Knee/Hip Exercises: Standing   Hip Abduction Stengthening;Right;Left;15 reps    Abduction Limitations 2#    Wall Squat 4 sets;5 reps   in circuit   Rocker Board Limitations on mini trampoline: heel raise x 10 with bil HHA, march with CGA x 10      Knee/Hip Exercises: Seated   Ball Squeeze 10    Sit to Sand 15 reps;without UE support      Shoulder Exercises: Standing   Extension Strengthening;Both;20 reps;Theraband    Theraband Level (Shoulder Extension) Level 2 (Red)    Row Right;Both;20 reps;Theraband    Theraband Level (Shoulder Row) Level 2 (  Red)                       PT Long Term Goals - 04/04/21 1050      PT LONG TERM GOAL #1   Title Pt will report improved ability to wash in the tub by at least 50%    Baseline pt reports improved ability to wash the back    Status Achieved      PT LONG TERM GOAL #2   Title Pt improve shoulder flexion bil to at least 135 to demonstrate improved reach    Baseline Rt: 90,  Lt: 130, bil 130 on 03/17/21; Rt 142 and Lt 148 degrees - 04/04/21    Status Achieved      PT LONG TERM GOAL #3   Title Pt will be ind with HEP for continued strength and mobility    Baseline Pt is indepedent with current HEP and reports feeling more stable ambulating around her house and using her cane less-04/04/21    Status On-going      PT LONG TERM GOAL #4   Title pt will be able to maintain tandem stance for 10 seconds without hand hold and without LOB    Baseline 03/17/21: Rt  forward 10" with no step assist    Lt: 10" with assist for foot placement    Status Achieved      PT LONG TERM GOAL #5   Title Pt will improve single leg stance time to 5 seconds on each leg without hand support to lessen fall risk    Baseline Pt able to attain ~2 sec each leg - 04/04/21    Status On-going      PT LONG TERM GOAL #6   Title Pt will perform 5x sit to stand in less then 17 seconds    Baseline Able to do 5 reps in 15 sec with no UE support and pt repotrs able to stand up without UE support at home now as well - 04/04/21    Status Achieved                 Plan - 04/12/21 1159    Clinical Impression Statement Pt continues with tolerance to high levels of activity and exercise and always feels great after leaving.  Pt will add more visits until 04/28/21 mark of 6 weeks and then we will discuss POC extension vs DC.  Pt was able to perform SL stance improved today with Rt: 3 seconds and Lt: 5seconds with SBA/CGA for pt wanting to stand longer than safe on leg.    PT Frequency 2x / week    PT Duration 6 weeks    PT Treatment/Interventions ADLs/Self Care Home Management;Therapeutic exercise;Patient/family education;Manual lymph drainage;Manual techniques;Gait training;Passive range of motion    PT Next Visit Plan add more single leg work, general exercise/mobility for UE/LE and cont circuit style, sit to stand, limit by pain response           Patient will benefit from skilled therapeutic intervention in order to improve the following deficits and impairments:     Visit Diagnosis: Chronic left shoulder pain  Chronic right shoulder pain  Neck pain  Other abnormalities of gait and mobility     Problem List Patient Active Problem List   Diagnosis Date Noted  . Osteopenia 01/13/2015  . Uterine cancer ([facility])   . 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    Gricelda Foland, Julieanne Manson  04/12/2021, 12:09 PM  [facility] [facility] [address] [place], [place], [address] Phone: [phone]   Fax:  [phone]  Name: Erica Whitney MRN: [id number] Date of Birth: Nov 14, 1941

## 2021-04-19 ENCOUNTER — Ambulatory Visit
Admission: RE | Admit: 2021-04-19 | Discharge: 2021-04-19 | Disposition: A | Discharge status: Home / Self Care | Payer: Medicare Other | Source: Ambulatory Visit | Attending: Nephrology | Admitting: Nephrology

## 2021-04-19 DIAGNOSIS — N179 Acute kidney failure, unspecified: Secondary | ICD-10-CM

## 2021-04-22 ENCOUNTER — Other Ambulatory Visit: Payer: Self-pay

## 2021-04-22 ENCOUNTER — Encounter: Payer: Self-pay | Admitting: Rehabilitation

## 2021-04-22 ENCOUNTER — Ambulatory Visit: Payer: Medicare Other | Attending: Hematology | Admitting: Rehabilitation

## 2021-04-22 DIAGNOSIS — R2689 Other abnormalities of gait and mobility: Secondary | ICD-10-CM | POA: Diagnosis present

## 2021-04-22 DIAGNOSIS — G8929 Other chronic pain: Secondary | ICD-10-CM

## 2021-04-22 DIAGNOSIS — M542 Cervicalgia: Secondary | ICD-10-CM

## 2021-04-22 DIAGNOSIS — M25511 Pain in right shoulder: Secondary | ICD-10-CM | POA: Diagnosis present

## 2021-04-22 DIAGNOSIS — M25512 Pain in left shoulder: Secondary | ICD-10-CM | POA: Insufficient documentation

## 2021-04-22 NOTE — Therapy (Signed)
[place], [place], [address] Phone: [phone]   Fax:  [phone]  Physical Therapy Treatment  Patient Details  Name: Erica Whitney MRN: [id number] Date of Birth: 29-Dec-1940 Referring Provider (PT): Dr. Burr Medico   Encounter Date: 04/22/2021   PT End of Session - 04/22/21 0956    Visit Number 12    Date for PT Re-Evaluation 04/28/21    PT Start Time 0900    PT Stop Time 0945    PT Time Calculation (min) 45 min    Activity Tolerance Patient tolerated treatment well    Behavior During Therapy [facility] for tasks assessed/performed           Past Medical History:  Diagnosis Date  . Arthritis   . Breast cancer ([place])   . Breast cancer of upper-outer quadrant of right female breast ([facility]) 11/06/2014  . Depression   . Diabetes mellitus   . Hypercholesteremia   . Hypertension   . Insomnia   . Peptic ulcer   . Uterine cancer ([place])    dx in her 29s  . Wears glasses     Past Surgical History:  Procedure Laterality Date  . ABDOMINAL HYSTERECTOMY  1995  . BACK SURGERY  2000   lumb lam  . BREAST LUMPECTOMY Right 2015  . COLONOSCOPY    . ORIF METACARPAL FRACTURE  2012   left  . RADIOACTIVE SEED GUIDED PARTIAL MASTECTOMY WITH AXILLARY SENTINEL LYMPH NODE BIOPSY Right 12/17/2014   Procedure: RIGHT BREAST RADIOACTIVE SEED LOCALIZED LUMPECTOMY AND SENTINEL NODE MAPPING;  Surgeon: Autumn Messing III, MD;  Location: [facility];  Service: General;  Laterality: Right;    There were no vitals filed for this visit.   Subjective Assessment - 04/22/21 0856    Subjective I am ok today    Pertinent History Treatment for right breast cancer completed in 2015 with lumpectomy/SLNB, antiestrogen x 5 years with NED.  Recent bone scan showing scoliosis, degenerative changes in the thoracic and lumbar spine and feet.  Other history includes DM, HTN, uterine cancer, lumbar laminectomy in 2000, CKD    Patient  Stated Goals less pain, move my arms    Currently in Pain? No/denies                             [facility] Adult PT Treatment/Exercise - 04/22/21 0001      Ambulation/Gait   Gait Comments amb around 694ft in clinic without AD      Neuro Re-ed    Neuro Re-ed Details  in parallel bars; tandem stance work 3x max CGA, and SL stance 3xmax CGA vcs to put leg down before losing balance,      Knee/Hip Exercises: Aerobic   Nustep Level 6, x 10 minutes with therapist monitoring pt throughout      Knee/Hip Exercises: Standing   Heel Raises Both;10 reps    Heel Raises Limitations at parallel bars    Knee Flexion Both;1 set;10 reps    Knee Flexion Limitations at counter 2# with vcs to stand upright    Hip Flexion Both;10 reps    Hip Flexion Limitations 2# at parallel bars    Hip Abduction Both;10 reps    Abduction Limitations 2#    Hip Extension Both;10 reps    Extension Limitations 2#    Forward Step Up 10 reps;Both;Step Height: 6"      Shoulder Exercises: Standing  Extension Both;20 reps    Theraband Level (Shoulder Extension) Level 1 (Yellow)    Row Right;Both;20 reps;Theraband    Theraband Level (Shoulder Row) Level 1 (Yellow)                       PT Long Term Goals - 04/04/21 1050      PT LONG TERM GOAL #1   Title Pt will report improved ability to wash in the tub by at least 50%    Baseline pt reports improved ability to wash the back    Status Achieved      PT LONG TERM GOAL #2   Title Pt improve shoulder flexion bil to at least 135 to demonstrate improved reach    Baseline Rt: 90,  Lt: 130, bil 130 on 03/17/21; Rt 142 and Lt 148 degrees - 04/04/21    Status Achieved      PT LONG TERM GOAL #3   Title Pt will be ind with HEP for continued strength and mobility    Baseline Pt is indepedent with current HEP and reports feeling more stable ambulating around her house and using her cane less-04/04/21    Status On-going      PT LONG TERM GOAL #4    Title pt will be able to maintain tandem stance for 10 seconds without hand hold and without LOB    Baseline 03/17/21: Rt forward 10" with no step assist    Lt: 10" with assist for foot placement    Status Achieved      PT LONG TERM GOAL #5   Title Pt will improve single leg stance time to 5 seconds on each leg without hand support to lessen fall risk    Baseline Pt able to attain ~2 sec each leg - 04/04/21    Status On-going      PT LONG TERM GOAL #6   Title Pt will perform 5x sit to stand in less then 17 seconds    Baseline Able to do 5 reps in 15 sec with no UE support and pt repotrs able to stand up without UE support at home now as well - 04/04/21    Status Achieved                 Plan - 04/22/21 0957    Clinical Impression Statement Pt continues to tolerate treatment well and has one more week approved with DC most likely.  Pt is able to perform tandem and single leg stance better today in the parallel bars and overall reports more mobility at home.    PT Frequency 2x / week    PT Duration 6 weeks    PT Treatment/Interventions ADLs/Self Care Home Management;Therapeutic exercise;Patient/family education;Manual lymph drainage;Manual techniques;Gait training;Passive range of motion    PT Next Visit Plan add more single leg work, general exercise/mobility for UE/LE and cont circuit style, sit to stand, limit by pain response    Consulted and Agree with Plan of Care Patient           Patient will benefit from skilled therapeutic intervention in order to improve the following deficits and impairments:     Visit Diagnosis: Chronic left shoulder pain  Chronic right shoulder pain  Neck pain  Other abnormalities of gait and mobility     Problem List Patient Active Problem List   Diagnosis Date Noted  . Osteopenia 01/13/2015  . Uterine cancer ([place])   . Breast cancer of upper-outer quadrant  of right female breast ([employer]) 11/06/2014    Erica Whitney 04/22/2021, 10:01  AM  [place], [place], [address] Phone: [phone]   Fax:  [phone]  Name: Erica Whitney MRN: [id number] Date of Birth: 05/28/1941

## 2021-04-26 ENCOUNTER — Ambulatory Visit: Payer: Medicare Other | Admitting: Rehabilitation

## 2021-04-26 ENCOUNTER — Encounter: Payer: Self-pay | Admitting: Rehabilitation

## 2021-04-26 ENCOUNTER — Other Ambulatory Visit: Payer: Self-pay

## 2021-04-26 DIAGNOSIS — M542 Cervicalgia: Secondary | ICD-10-CM

## 2021-04-26 DIAGNOSIS — G8929 Other chronic pain: Secondary | ICD-10-CM

## 2021-04-26 DIAGNOSIS — R2689 Other abnormalities of gait and mobility: Secondary | ICD-10-CM

## 2021-04-26 DIAGNOSIS — M25512 Pain in left shoulder: Secondary | ICD-10-CM | POA: Diagnosis not present

## 2021-04-26 NOTE — Therapy (Signed)
[place], [place], [address] Phone: [phone]   Fax:  [phone]  Physical Therapy Treatment  Patient Details  Name: Erica Whitney MRN: [id number] Date of Birth: [birth year] Referring Provider (PT): Dr. Burr Medico   Encounter Date: 04/26/2021   PT End of Session - 04/26/21 1149    Visit Number 13    Date for PT Re-Evaluation 04/28/21    PT Start Time 1053    PT Stop Time 1150    PT Time Calculation (min) 57 min    Activity Tolerance Patient tolerated treatment well    Behavior During Therapy [facility] for tasks assessed/performed           Past Medical History:  Diagnosis Date  . Arthritis   . Breast cancer ([place])   . Breast cancer of upper-outer quadrant of right female breast ([place]) 11/06/2014  . Depression   . Diabetes mellitus   . Hypercholesteremia   . Hypertension   . Insomnia   . Peptic ulcer   . Uterine cancer ([place])    dx in her 86s  . Wears glasses     Past Surgical History:  Procedure Laterality Date  . ABDOMINAL HYSTERECTOMY  1995  . BACK SURGERY  2000   lumb lam  . BREAST LUMPECTOMY Right 2015  . COLONOSCOPY    . ORIF METACARPAL FRACTURE  2012   left  . RADIOACTIVE SEED GUIDED PARTIAL MASTECTOMY WITH AXILLARY SENTINEL LYMPH NODE BIOPSY Right 12/17/2014   Procedure: RIGHT BREAST RADIOACTIVE SEED LOCALIZED LUMPECTOMY AND SENTINEL NODE MAPPING;  Surgeon: Autumn Messing III, MD;  Location: [place];  Service: General;  Laterality: Right;    There were no vitals filed for this visit.   Subjective Assessment - 04/26/21 1050    Subjective nothing new    Pertinent History Treatment for right breast cancer completed in 2015 with lumpectomy/SLNB, antiestrogen x 5 years with NED.  Recent bone scan showing scoliosis, degenerative changes in the thoracic and lumbar spine and feet.  Other history includes DM, HTN, uterine cancer, lumbar laminectomy in 2000, CKD    Patient  Stated Goals less pain, move my arms    Currently in Pain? No/denies                             [facility] Adult PT Treatment/Exercise - 04/26/21 0001      Neuro Re-ed    Neuro Re-ed Details  in parallel bars; tandem stance work 3x max CGA, and SL stance 3xmax CGA vcs to put leg down before losing balance, walking on slanted peds floor x 2 lengths      Knee/Hip Exercises: Aerobic   Nustep Level 4, x 12 minutes with therapist monitoring pt throughout      Knee/Hip Exercises: Standing   Heel Raises Both;10 reps    Heel Raises Limitations at parallel bars    Knee Flexion Both;1 set;10 reps    Knee Flexion Limitations at counter 2# with vcs to stand upright    Hip Flexion Both;10 reps    Hip Flexion Limitations 2# at parallel bars    Hip Abduction Both;10 reps    Abduction Limitations 2#    Forward Step Up 10 reps;Both;Step Height: 6"      Knee/Hip Exercises: Seated   Long Arc Quad Both;15 reps    Long Arc Quad Weight 2 lbs.    [employer] Squeeze 10    Marching  Strengthening;Both;15 reps    Marching Limitations opposite arm raise    Marching Weights 2 lbs.      Shoulder Exercises: Standing   External Rotation Strengthening;Both;15 reps;Theraband    Theraband Level (Shoulder External Rotation) Level 2 (Red)                       PT Long Term Goals - 04/26/21 1112      PT LONG TERM GOAL #1   Title Pt will report improved ability to wash in the tub by at least 50%    Baseline I can wash my back now    Status Achieved      PT LONG TERM GOAL #2   Title Pt improve shoulder flexion bil to at least 135 to demonstrate improved reach    Baseline Rt: 90,  Lt: 130, bil 130 on 03/17/21; Rt 142 and Lt 148 degrees - 04/04/21    Status Achieved      PT LONG TERM GOAL #3   Title Pt will be ind with HEP for continued strength and mobility    Baseline Pt is indepedent with current HEP and reports feeling more stable ambulating around her house and using her cane  less-04/04/21    Status Achieved      PT LONG TERM GOAL #4   Title pt will be able to maintain tandem stance for 10 seconds without hand hold and without LOB    Status Achieved      PT LONG TERM GOAL #5   Title Pt will improve single leg stance time to 5 seconds on each leg without hand support to lessen fall risk    Baseline Pt able to attain ~2 sec each leg - 04/04/21, 04/26/21: R/L: 2"    Status Not Met      PT LONG TERM GOAL #6   Title Pt will perform 5x sit to stand in less then 17 seconds    Baseline Able to do 5 reps in 15 sec with no UE support and pt repotrs able to stand up without UE support at home now as well - 04/04/21    Status Achieved                 Plan - 04/26/21 1150    Clinical Impression Statement Pt has met all goals except single leg stance time which remains at 2 seconds.  Pt is now able to get her own mail and is not feeling as scared as she used to be.  One more session and then DC    PT Frequency 2x / week    PT Duration 6 weeks    PT Treatment/Interventions ADLs/Self Care Home Management;Therapeutic exercise;Patient/family education;Manual lymph drainage;Manual techniques;Gait training;Passive range of motion    PT Next Visit Plan add more single leg work, general exercise/mobility for UE/LE and cont circuit style, sit to stand, limit by pain response    Consulted and Agree with Plan of Care Patient           Patient will benefit from skilled therapeutic intervention in order to improve the following deficits and impairments:     Visit Diagnosis: Chronic left shoulder pain  Chronic right shoulder pain  Neck pain  Other abnormalities of gait and mobility     Problem List Patient Active Problem List   Diagnosis Date Noted  . Osteopenia 01/13/2015  . Uterine cancer ([place])   . Breast cancer of upper-outer quadrant of right female breast (  [place]) 11/06/2014    Stark Bray 04/26/2021, 11:55 AM  [place], [place], [address] Phone: [phone]   Fax:  [phone]  Name: Erica Whitney MRN: [id number] Date of Birth: 03/07/1941

## 2021-04-28 ENCOUNTER — Ambulatory Visit: Payer: Medicare Other | Admitting: Rehabilitation

## 2021-05-11 ENCOUNTER — Other Ambulatory Visit: Payer: Self-pay

## 2021-05-11 ENCOUNTER — Ambulatory Visit: Payer: Medicare Other

## 2021-05-11 DIAGNOSIS — K264 Chronic or unspecified duodenal ulcer with hemorrhage: Secondary | ICD-10-CM | POA: Diagnosis not present

## 2021-05-11 DIAGNOSIS — K625 Hemorrhage of anus and rectum: Secondary | ICD-10-CM | POA: Diagnosis not present

## 2021-05-11 DIAGNOSIS — G8929 Other chronic pain: Secondary | ICD-10-CM

## 2021-05-11 DIAGNOSIS — R2689 Other abnormalities of gait and mobility: Secondary | ICD-10-CM

## 2021-05-11 DIAGNOSIS — M542 Cervicalgia: Secondary | ICD-10-CM

## 2021-05-11 NOTE — Therapy (Signed)
[place], [place], [address] Phone: [phone]   Fax:  [phone]  Physical Therapy Treatment  Patient Details  Name: Erica Whitney MRN: [id number] Date of Birth: 1941-10-23 Referring Provider (PT): Dr. Burr Medico   Encounter Date: 05/11/2021    Past Medical History:  Diagnosis Date  . Arthritis   . Breast cancer ([place])   . Breast cancer of upper-outer quadrant of right female breast ([place]) 11/06/2014  . Depression   . Diabetes mellitus   . Hypercholesteremia   . Hypertension   . Insomnia   . Peptic ulcer   . Uterine cancer ([place])    dx in her 64s  . Wears glasses     Past Surgical History:  Procedure Laterality Date  . ABDOMINAL HYSTERECTOMY  1995  . BACK SURGERY  2000   lumb lam  . BREAST LUMPECTOMY Right 2015  . COLONOSCOPY    . ORIF METACARPAL FRACTURE  2012   left  . RADIOACTIVE SEED GUIDED PARTIAL MASTECTOMY WITH AXILLARY SENTINEL LYMPH NODE BIOPSY Right 12/17/2014   Procedure: RIGHT BREAST RADIOACTIVE SEED LOCALIZED LUMPECTOMY AND SENTINEL NODE MAPPING;  Surgeon: Autumn Messing III, MD;  Location: [place];  Service: General;  Laterality: Right;    There were no vitals filed for this visit.   Subjective Assessment - 05/11/21 1113    Subjective Doing well, noting new since I was here last.    Pertinent History Treatment for right breast cancer completed in 2015 with lumpectomy/SLNB, antiestrogen x 5 years with NED.  Recent bone scan showing scoliosis, degenerative changes in the thoracic and lumbar spine and feet.  Other history includes DM, HTN, uterine cancer, lumbar laminectomy in 2000, CKD    Patient Stated Goals less pain, move my arms    Currently in Pain? No/denies                             [facility] Adult PT Treatment/Exercise - 05/11/21 0001      Neuro Re-ed    Neuro Re-ed Details  in parallel bars: front and retrao heel-toe walking with  VCs for fingertips only along bar with +1 UE; slow, controlled high knee marching 3x; tandem stance work multiple trials with 1 finger on bar, and then multiple trials of SL stance 3xmax CGA vcs to put leg down before losing balance on airex, then on level surface multiple trials of each, walking on slanted peds floor x 2 lengths      Knee/Hip Exercises: Aerobic   Nustep Level 5, x 12 minutes with therapist monitoring pt throughout      Knee/Hip Exercises: Standing   Heel Raises Both;15 reps    Heel Raises Limitations at back of bike for +1 UE support, tried SL heel raise but pt unable    Hip Flexion Stengthening;Right;Left;15 reps   2# on each ankle standing at back of bike   Hip Flexion Limitations encouraged 1 finger for UE support for all 3 hip raises and VCs for slower pace    Hip Abduction Stengthening;Right;Left;15 reps   2# each ankle standing at back of bike   Hip Extension Stengthening;Right;Left;15 reps   2# on each ankle standing at back of bike   Wall Squat 2 sets;10 reps    Wall Squat Limitations VCs to remind pt to have feet further away from wall so knees don't go over toes      Shoulder Exercises:  Pulleys   Flexion 2 minutes   during seated rest   ABduction 2 minutes                       PT Long Term Goals - 05/11/21 1216      PT LONG TERM GOAL #1   Title Pt will report improved ability to wash in the tub by at least 50%    Baseline I can wash my back now    Status Achieved      PT LONG TERM GOAL #2   Title Pt improve shoulder flexion bil to at least 135 to demonstrate improved reach    Baseline Rt: 90,  Lt: 130, bil 130 on 03/17/21; Rt 142 and Lt 148 degrees - 04/04/21    Status Achieved      PT LONG TERM GOAL #3   Title Pt will be ind with HEP for continued strength and mobility    Baseline Pt is indepedent with current HEP and reports feeling more stable ambulating around her house and using her cane less-04/04/21    Status Achieved      PT LONG  TERM GOAL #4   Title pt will be able to maintain tandem stance for 10 seconds without hand hold and without LOB    Baseline 03/17/21: Rt forward 10" with no step assist    Lt: 10" with assist for foot placement    Status Achieved      PT LONG TERM GOAL #5   Title Pt will improve single leg stance time to 5 seconds on each leg without hand support to lessen fall risk    Baseline Pt able to attain ~2 sec each leg - 04/04/21, 04/26/21: R/L: 2"; pt still only able to maintain ~2 sec each leg but feels less unstable when doing this and only requires 1 finger for assist-05/11/21    Status Partially Met      PT LONG TERM GOAL #6   Title Pt will perform 5x sit to stand in less then 17 seconds    Baseline Able to do 5 reps in 15 sec with no UE support and pt repotrs able to stand up without UE support at home now as well - 04/04/21    Status Achieved                 Plan - 05/11/21 1222    Clinical Impression Statement Pt has met all goals except SLS for 5 sec. She has showed good progress with this though as she is less unstable with this and can hold with only 1 finger support. Pt also reported at end of session that she isn't fearful to leave her house anymore as her fear of falling has drastically reduced. She also reports independence with HEP and verbalizes importance of continuing these. Pt is ready for D/C at this time.    Personal Factors and Comorbidities Age;Fitness;Time since onset of injury/illness/exacerbation;Comorbidity 3+    Comorbidities cancer history Rt breast, CKD, DM, OA status    Examination-Activity Limitations Lift;Bathing;Toileting;Hygiene/Grooming;Carry    Examination-Participation Restrictions Cleaning;Meal Prep;Yard Work;Community Activity    Stability/Clinical Decision Making Stable/Uncomplicated    Rehab Potential Fair    PT Frequency 2x / week    PT Duration 6 weeks    PT Treatment/Interventions ADLs/Self Care Home Management;Therapeutic exercise;Patient/family  education;Manual lymph drainage;Manual techniques;Gait training;Passive range of motion    PT Next Visit Plan D/C this session    PT Home Exercise Plan  Access Code: [email or web address]; standing and seated bil LE strength and balance static activities    Consulted and Agree with Plan of Care Patient           Patient will benefit from skilled therapeutic intervention in order to improve the following deficits and impairments:  Pain,Difficulty walking,Decreased balance,Decreased safety awareness,Decreased activity tolerance,Decreased range of motion,Decreased strength,Impaired UE functional use  Visit Diagnosis: Chronic left shoulder pain  Chronic right shoulder pain  Neck pain  Other abnormalities of gait and mobility     Problem List Patient Active Problem List   Diagnosis Date Noted  . Osteopenia 01/13/2015  . Uterine cancer ([place])   . Breast cancer of upper-outer quadrant of right female breast ([place]) 11/06/2014    Otelia Limes, PTA 05/11/2021, 12:32 PM  [place], [place], [address] Phone: [phone]   Fax:  [phone]  Name: JAELY SILMAN MRN: [id number] Date of Birth: 03-29-1941

## 2021-05-13 ENCOUNTER — Inpatient Hospital Stay (HOSPITAL_COMMUNITY)
Admission: EM | Admit: 2021-05-13 | Discharge: 2021-05-16 | DRG: 378 | Disposition: A | Discharge status: Home / Self Care | Payer: Medicare Other | Attending: Student | Admitting: Student

## 2021-05-13 ENCOUNTER — Encounter (HOSPITAL_COMMUNITY): Payer: Self-pay

## 2021-05-13 ENCOUNTER — Other Ambulatory Visit: Payer: Self-pay

## 2021-05-13 DIAGNOSIS — Z8542 Personal history of malignant neoplasm of other parts of uterus: Secondary | ICD-10-CM

## 2021-05-13 DIAGNOSIS — K922 Gastrointestinal hemorrhage, unspecified: Secondary | ICD-10-CM

## 2021-05-13 DIAGNOSIS — Z800 Family history of malignant neoplasm of digestive organs: Secondary | ICD-10-CM

## 2021-05-13 DIAGNOSIS — E872 Acidosis: Secondary | ICD-10-CM | POA: Diagnosis not present

## 2021-05-13 DIAGNOSIS — D509 Iron deficiency anemia, unspecified: Secondary | ICD-10-CM | POA: Diagnosis present

## 2021-05-13 DIAGNOSIS — F32A Depression, unspecified: Secondary | ICD-10-CM | POA: Diagnosis present

## 2021-05-13 DIAGNOSIS — Z79899 Other long term (current) drug therapy: Secondary | ICD-10-CM

## 2021-05-13 DIAGNOSIS — K317 Polyp of stomach and duodenum: Secondary | ICD-10-CM | POA: Diagnosis present

## 2021-05-13 DIAGNOSIS — Z7984 Long term (current) use of oral hypoglycemic drugs: Secondary | ICD-10-CM

## 2021-05-13 DIAGNOSIS — K5731 Diverticulosis of large intestine without perforation or abscess with bleeding: Secondary | ICD-10-CM | POA: Diagnosis present

## 2021-05-13 DIAGNOSIS — I159 Secondary hypertension, unspecified: Secondary | ICD-10-CM

## 2021-05-13 DIAGNOSIS — K625 Hemorrhage of anus and rectum: Secondary | ICD-10-CM | POA: Diagnosis not present

## 2021-05-13 DIAGNOSIS — Z7982 Long term (current) use of aspirin: Secondary | ICD-10-CM

## 2021-05-13 DIAGNOSIS — Z794 Long term (current) use of insulin: Secondary | ICD-10-CM

## 2021-05-13 DIAGNOSIS — Z9011 Acquired absence of right breast and nipple: Secondary | ICD-10-CM

## 2021-05-13 DIAGNOSIS — E7800 Pure hypercholesterolemia, unspecified: Secondary | ICD-10-CM | POA: Diagnosis present

## 2021-05-13 DIAGNOSIS — Z20822 Contact with and (suspected) exposure to covid-19: Secondary | ICD-10-CM | POA: Diagnosis present

## 2021-05-13 DIAGNOSIS — E1122 Type 2 diabetes mellitus with diabetic chronic kidney disease: Secondary | ICD-10-CM | POA: Diagnosis not present

## 2021-05-13 DIAGNOSIS — I129 Hypertensive chronic kidney disease with stage 1 through stage 4 chronic kidney disease, or unspecified chronic kidney disease: Secondary | ICD-10-CM | POA: Diagnosis present

## 2021-05-13 DIAGNOSIS — K264 Chronic or unspecified duodenal ulcer with hemorrhage: Principal | ICD-10-CM | POA: Diagnosis present

## 2021-05-13 DIAGNOSIS — Z801 Family history of malignant neoplasm of trachea, bronchus and lung: Secondary | ICD-10-CM

## 2021-05-13 DIAGNOSIS — Z6830 Body mass index (BMI) 30.0-30.9, adult: Secondary | ICD-10-CM

## 2021-05-13 DIAGNOSIS — C50411 Malignant neoplasm of upper-outer quadrant of right female breast: Secondary | ICD-10-CM

## 2021-05-13 DIAGNOSIS — I10 Essential (primary) hypertension: Secondary | ICD-10-CM | POA: Diagnosis present

## 2021-05-13 DIAGNOSIS — D696 Thrombocytopenia, unspecified: Secondary | ICD-10-CM | POA: Diagnosis not present

## 2021-05-13 DIAGNOSIS — D631 Anemia in chronic kidney disease: Secondary | ICD-10-CM | POA: Diagnosis present

## 2021-05-13 DIAGNOSIS — Z87891 Personal history of nicotine dependence: Secondary | ICD-10-CM

## 2021-05-13 DIAGNOSIS — M8580 Other specified disorders of bone density and structure, unspecified site: Secondary | ICD-10-CM | POA: Diagnosis present

## 2021-05-13 DIAGNOSIS — Z803 Family history of malignant neoplasm of breast: Secondary | ICD-10-CM

## 2021-05-13 DIAGNOSIS — K279 Peptic ulcer, site unspecified, unspecified as acute or chronic, without hemorrhage or perforation: Secondary | ICD-10-CM | POA: Diagnosis present

## 2021-05-13 DIAGNOSIS — E785 Hyperlipidemia, unspecified: Secondary | ICD-10-CM | POA: Diagnosis present

## 2021-05-13 DIAGNOSIS — E119 Type 2 diabetes mellitus without complications: Secondary | ICD-10-CM

## 2021-05-13 DIAGNOSIS — K315 Obstruction of duodenum: Secondary | ICD-10-CM | POA: Diagnosis present

## 2021-05-13 DIAGNOSIS — N1832 Chronic kidney disease, stage 3b: Secondary | ICD-10-CM | POA: Diagnosis present

## 2021-05-13 DIAGNOSIS — D62 Acute posthemorrhagic anemia: Secondary | ICD-10-CM | POA: Diagnosis present

## 2021-05-13 DIAGNOSIS — Z853 Personal history of malignant neoplasm of breast: Secondary | ICD-10-CM

## 2021-05-13 DIAGNOSIS — E1140 Type 2 diabetes mellitus with diabetic neuropathy, unspecified: Secondary | ICD-10-CM | POA: Diagnosis present

## 2021-05-13 DIAGNOSIS — Z170 Estrogen receptor positive status [ER+]: Secondary | ICD-10-CM

## 2021-05-13 DIAGNOSIS — N182 Chronic kidney disease, stage 2 (mild): Secondary | ICD-10-CM | POA: Diagnosis not present

## 2021-05-13 DIAGNOSIS — E1165 Type 2 diabetes mellitus with hyperglycemia: Secondary | ICD-10-CM | POA: Diagnosis not present

## 2021-05-13 DIAGNOSIS — N179 Acute kidney failure, unspecified: Secondary | ICD-10-CM | POA: Diagnosis not present

## 2021-05-13 LAB — COMPREHENSIVE METABOLIC PANEL
ALT: 72 U/L — ABNORMAL HIGH (ref 0–44)
AST: 47 U/L — ABNORMAL HIGH (ref 15–41)
Albumin: 3.4 g/dL — ABNORMAL LOW (ref 3.5–5.0)
Alkaline Phosphatase: 79 U/L (ref 38–126)
Anion gap: 11 (ref 5–15)
BUN: 43 mg/dL — ABNORMAL HIGH (ref 8–23)
CO2: 20 mmol/L — ABNORMAL LOW (ref 22–32)
Calcium: 8.9 mg/dL (ref 8.9–10.3)
Chloride: 109 mmol/L (ref 98–111)
Creatinine, Ser: 2.22 mg/dL — ABNORMAL HIGH (ref 0.44–1.00)
GFR, Estimated: 22 mL/min — ABNORMAL LOW (ref >60)
Glucose, Bld: 191 mg/dL — ABNORMAL HIGH (ref 70–99)
Potassium: 4.4 mmol/L (ref 3.5–5.1)
Sodium: 140 mmol/L (ref 135–145)
Total Bilirubin: 0.6 mg/dL (ref 0.3–1.2)
Total Protein: 6.6 g/dL (ref 6.5–8.1)

## 2021-05-13 LAB — CBC
HCT: 26 % — ABNORMAL LOW (ref 36.0–46.0)
Hemoglobin: 8 g/dL — ABNORMAL LOW (ref 12.0–15.0)
MCH: 30.3 pg (ref 26.0–34.0)
MCHC: 30.8 g/dL (ref 30.0–36.0)
MCV: 98.5 fL (ref 80.0–100.0)
Platelets: 235 10*3/uL (ref 150–400)
RBC: 2.64 MIL/uL — ABNORMAL LOW (ref 3.87–5.11)
RDW: 14 % (ref 11.5–15.5)
WBC: 10.5 10*3/uL (ref 4.0–10.5)
nRBC: 0 % (ref 0.0–0.2)

## 2021-05-13 LAB — RESP PANEL BY RT-PCR (FLU A&B, COVID) ARPGX2
Influenza A by PCR: NEGATIVE
Influenza B by PCR: NEGATIVE
SARS Coronavirus 2 by RT PCR: NEGATIVE

## 2021-05-13 LAB — HEMOGLOBIN AND HEMATOCRIT, BLOOD
HCT: 19.7 % — ABNORMAL LOW (ref 36.0–46.0)
Hemoglobin: 6 g/dL — CL (ref 12.0–15.0)

## 2021-05-13 LAB — GLUCOSE, CAPILLARY
Glucose-Capillary: 97 mg/dL (ref 70–99)
Glucose-Capillary: 100 mg/dL — ABNORMAL HIGH (ref 70–99)

## 2021-05-13 LAB — PROTIME-INR
INR: 1.2 (ref 0.8–1.2)
Prothrombin Time: 14.9 seconds (ref 11.4–15.2)

## 2021-05-13 LAB — POC OCCULT BLOOD, ED: Fecal Occult Bld: POSITIVE — AB

## 2021-05-13 LAB — LIPASE, BLOOD: Lipase: 30 U/L (ref 11–51)

## 2021-05-13 LAB — ABO/RH: ABO/RH(D): O POS

## 2021-05-13 LAB — PREPARE RBC (CROSSMATCH)

## 2021-05-13 MED ORDER — INSULIN ASPART 100 UNIT/ML IJ SOLN
0.0000 [IU] | Freq: Three times a day (TID) | INTRAMUSCULAR | Status: DC
  Administered 2021-05-14 – 2021-05-15 (×5): 2 [IU] via SUBCUTANEOUS

## 2021-05-13 MED ORDER — HYDRALAZINE HCL 10 MG PO TABS
10.0000 mg | ORAL_TABLET | Freq: Four times a day (QID) | ORAL | Status: DC | PRN
  Administered 2021-05-13: 10 mg via ORAL
  Filled 2021-05-13: qty 1

## 2021-05-13 MED ORDER — GLIMEPIRIDE 4 MG PO TABS: 4.0000 mg | ORAL_TABLET | Freq: Two times a day (BID) | ORAL | Status: DC

## 2021-05-13 MED ORDER — SODIUM CHLORIDE 0.9% IV SOLUTION: Freq: Once | INTRAVENOUS | Status: AC

## 2021-05-13 MED ORDER — ATORVASTATIN CALCIUM 40 MG PO TABS
40.0000 mg | ORAL_TABLET | Freq: Every day | ORAL | Status: DC
  Administered 2021-05-13 – 2021-05-15 (×3): 40 mg via ORAL
  Filled 2021-05-13 (×3): qty 1

## 2021-05-13 MED ORDER — PANTOPRAZOLE SODIUM 40 MG IV SOLR
40.0000 mg | Freq: Once | INTRAVENOUS | Status: DC
  Filled 2021-05-13: qty 40

## 2021-05-13 MED ORDER — ACETAMINOPHEN 650 MG RE SUPP: 650.0000 mg | Freq: Four times a day (QID) | RECTAL | Status: DC | PRN

## 2021-05-13 MED ORDER — SODIUM CHLORIDE 0.9 % IV BOLUS
500.0000 mL | Freq: Once | INTRAVENOUS | Status: AC
  Administered 2021-05-13: 500 mL via INTRAVENOUS

## 2021-05-13 MED ORDER — SODIUM CHLORIDE 0.9 % IV SOLN
80.0000 mg | Freq: Once | INTRAVENOUS | Status: AC
  Administered 2021-05-13: 80 mg via INTRAVENOUS
  Filled 2021-05-13: qty 80

## 2021-05-13 MED ORDER — PANTOPRAZOLE SODIUM 40 MG PO TBEC: 40.0000 mg | DELAYED_RELEASE_TABLET | Freq: Every day | ORAL | Status: DC

## 2021-05-13 MED ORDER — SODIUM CHLORIDE 0.9 % IV SOLN: INTRAVENOUS | Status: DC

## 2021-05-13 MED ORDER — SERTRALINE HCL 100 MG PO TABS
100.0000 mg | ORAL_TABLET | Freq: Every day | ORAL | Status: DC
  Administered 2021-05-13 – 2021-05-16 (×4): 100 mg via ORAL
  Filled 2021-05-13 (×4): qty 1

## 2021-05-13 MED ORDER — LISINOPRIL-HYDROCHLOROTHIAZIDE 10-12.5 MG PO TABS: 1.0000 | ORAL_TABLET | Freq: Every day | ORAL | Status: DC

## 2021-05-13 MED ORDER — ACETAMINOPHEN 325 MG PO TABS
650.0000 mg | ORAL_TABLET | Freq: Four times a day (QID) | ORAL | Status: DC | PRN
  Administered 2021-05-15: 650 mg via ORAL
  Filled 2021-05-13: qty 2

## 2021-05-13 MED ORDER — NIFEDIPINE ER OSMOTIC RELEASE 60 MG PO TB24: 60.0000 mg | ORAL_TABLET | Freq: Every day | ORAL | Status: DC

## 2021-05-13 MED ORDER — HYDRALAZINE HCL 25 MG PO TABS
25.0000 mg | ORAL_TABLET | Freq: Four times a day (QID) | ORAL | Status: DC | PRN
  Administered 2021-05-13 – 2021-05-14 (×2): 25 mg via ORAL
  Filled 2021-05-13 (×3): qty 1

## 2021-05-13 NOTE — Progress Notes (Signed)
Pt had one episode of dark red soft stool medium sized.

## 2021-05-13 NOTE — H&P (Signed)
History and Physical    Erica Whitney:[id number] DOB: May 21, 1941 DOA: 05/13/2021  PCP: Nolene Ebbs, MD (Confirm with patient/family/NH records and if not entered, this has to be entered at [facility] point of entry) Patient coming from: home  I have personally briefly reviewed patient's old medical records in [place]  Chief Complaint: BRBPR  HPI: Erica Whitney is a 80 y.o. female with medical history significant of DM, HTN, PUD, HLD, chronic renal insufficiency - CKD III who reports an episode 05/12/21 of bright red blood per rectum with BM. She had a second episode today. She denies seeing any blood clots. She has not had any lower abdominal pain. She has not had any significant upper abdominal pain. She has h/o PUD and was on PPI but has not been taking recently. She reports having a colonoscopy 2015 or so with no abnormalities that she recalls.  She denies near syncope, SOB, Chest pain. Due to BRBPR she presents to [facility] for evaluation.   ED Course: T 995  169/71  HR 72  RR 16. ED-PA exam notable for II/VI mm, diffuse abdominal pain. Lab with Glucose 191, Cr 2.22 ( 3.08 02/03/21, 2.54 01/27/21) eGFR 22; Hgb 8.0 ( 9.1 02/03/21). EKG with NSR. Patient was given Protonix 80 mg IV bolus in ED. Dr. Verdia Kuba was called and will see patient. TRH called to admit for LGI bleed.   Review of Systems: As per HPI otherwise 10 point review of systems negative.    Past Medical History:  Diagnosis Date  . Arthritis   . Breast cancer ([place])   . Breast cancer of upper-outer quadrant of right female breast ([place]) 11/06/2014  . Depression   . Diabetes mellitus   . Hypercholesteremia   . Hypertension   . Insomnia   . Peptic ulcer   . Uterine cancer ([place])    dx in her 33s  . Wears glasses     Past Surgical History:  Procedure Laterality Date  . ABDOMINAL HYSTERECTOMY  1995  . BACK SURGERY  2000   lumb lam  . BREAST LUMPECTOMY Right 2015  . COLONOSCOPY    . ORIF METACARPAL FRACTURE   2012   left  . RADIOACTIVE SEED GUIDED PARTIAL MASTECTOMY WITH AXILLARY SENTINEL LYMPH NODE BIOPSY Right 12/17/2014   Procedure: RIGHT BREAST RADIOACTIVE SEED LOCALIZED LUMPECTOMY AND SENTINEL NODE MAPPING;  Surgeon: Autumn Messing III, MD;  Location: [facility];  Service: General;  Laterality: Right;    Soc Hx - never married. She had 2 daughters, one of whom died 1 year ago 2/2 complications of DM, 2 sons, 4 grandchildren (1 boy, 3 girls). 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 She lives alone but her children are attentive.    reports that she quit smoking about 9 years ago. She smoked 0.25 packs per day. She has quit using smokeless tobacco.  Her smokeless tobacco use included snuff. She reports that she does not drink alcohol and does not use drugs.  No Known Allergies  Family History  Problem Relation Age of Onset  . Cancer Mother   . Stomach cancer Mother 70  . Heart attack Father   . Cancer Brother   . Lung cancer Brother 65  . Breast cancer Sister 72     Prior to Admission medications   Medication Sig Start Date End Date Taking? Authorizing Provider  ACCU-CHEK GUIDE test strip  03/12/17   [provider]  [facility] INSULIN  SYR ULTRA THIN 31G X 5/16" 0.3 ML MISC  03/12/17   [provider]  aspirin EC 81 MG tablet Take 81 mg by mouth daily.    [provider]  atorvastatin (LIPITOR) 40 MG tablet Take 40 mg by mouth daily.    [provider]  Blood Glucose Calibration (ACCU-CHEK GUIDE CONTROL) LIQD  03/12/17   [provider]  Calcium Carbonate-Vitamin D 600-400 MG-UNIT tablet Take 1 tablet by mouth daily.    [provider]  cycloSPORINE (RESTASIS) 0.05 % ophthalmic emulsion Place 1 drop into both eyes 2 (two) times daily.    [provider]  dextromethorphan-guaiFENesin (MUCINEX DM) 30-600 MG per 12 hr tablet Take 1 tablet by mouth 2 (two) times daily.    [provider]  glimepiride  (AMARYL) 4 MG tablet Take 4 mg by mouth 2 (two) times daily with a meal.    [provider]  lisinopril-hydrochlorothiazide (PRINZIDE,ZESTORETIC) 10-12.5 MG per tablet Take 1 tablet by mouth daily.    [provider]  metFORMIN (GLUCOPHAGE) 1000 MG tablet As directed 11/03/15   [provider]  NIFEdipine (PROCARDIA XL/ADALAT-CC) 60 MG 24 hr tablet Take 60 mg by mouth daily.    [provider]  Omega-3 Fatty Acids (FISH OIL) 1000 MG CAPS Take 1 capsule by mouth daily.    [provider]  pantoprazole (PROTONIX) 40 MG tablet Take 40 mg by mouth daily. 12/10/14   [provider]  sertraline (ZOLOFT) 100 MG tablet Take 100 mg by mouth every morning.    [provider]  triamcinolone cream (KENALOG) 0.5 %  11/17/14   [provider]  vitamin C (ASCORBIC ACID) 500 MG tablet Take 500 mg by mouth daily.    [provider]    Physical Exam: Vitals:   05/13/21 1245 05/13/21 1345 05/13/21 1400 05/13/21 1445  BP: (!) 193/84 (!) 178/73 (!) 169/71 (!) 181/91  Pulse: 70 72 72 77  Resp: 15 12 16 16   Temp:      SpO2: 96% 97% 96% 99%     Vitals:   05/13/21 1245 05/13/21 1345 05/13/21 1400 05/13/21 1445  BP: (!) 193/84 (!) 178/73 (!) 169/71 (!) 181/91  Pulse: 70 72 72 77  Resp: 15 12 16 16   Temp:      SpO2: 96% 97% 96% 99%   General: WNWD woman in no distress. Eyes: PERRL, lids and conjunctivae normal ENMT: Mucous membranes are moist.   Neck: normal, supple, no masses, no thyromegaly Respiratory: clear to auscultation bilaterally, no wheezing, no crackles. Normal respiratory effort. No accessory muscle use.  Cardiovascular: Regular tachycardia, II/VI systolic murmur best at LSB /no  rubs / no gallops. No extremity edema. 2+ pedal pulses. No carotid bruits.  Abdomen: no tenderness, no masses palpated. No hepatosplenomegaly. Bowel sounds positive.  Musculoskeletal: no clubbing / cyanosis. No joint deformity upper and  lower extremities. Good ROM, no contractures. Normal muscle tone.  Skin: no rashes, lesions, ulcers. No induration Neurologic: CN 2-12 grossly intact. Sensation intact.  Strength 5/5 in all 4.  Psychiatric: Normal judgment and insight. Alert and oriented x 3. Normal mood.     Labs on Admission: I have personally reviewed following labs and imaging studies  CBC: Recent Labs  Lab 05/13/21 0903  WBC 10.5  HGB 8.0*  HCT 26.0*  MCV 98.5  PLT 149   Basic Metabolic Panel: Recent Labs  Lab 05/13/21 0903  NA 140  K 4.4  CL 109  CO2  20*  GLUCOSE 191*  BUN 43*  CREATININE 2.22*  CALCIUM 8.9   GFR: CrCl cannot be calculated (Unknown ideal weight.). Liver Function Tests: Recent Labs  Lab 05/13/21 0903  AST 47*  ALT 72*  ALKPHOS 79  BILITOT 0.6  PROT 6.6  ALBUMIN 3.4*   Recent Labs  Lab 05/13/21 0958  LIPASE 30   No results for input(s): AMMONIA in the last 168 hours. Coagulation Profile: Recent Labs  Lab 05/13/21 0958  INR 1.2   Cardiac Enzymes: No results for input(s): CKTOTAL, CKMB, CKMBINDEX, TROPONINI in the last 168 hours. BNP (last 3 results) No results for input(s): PROBNP in the last 8760 hours. HbA1C: No results for input(s): HGBA1C in the last 72 hours. CBG: No results for input(s): GLUCAP in the last 168 hours. Lipid Profile: No results for input(s): CHOL, HDL, LDLCALC, TRIG, CHOLHDL, LDLDIRECT in the last 72 hours. Thyroid Function Tests: No results for input(s): TSH, T4TOTAL, FREET4, T3FREE, THYROIDAB in the last 72 hours. Anemia Panel: No results for input(s): VITAMINB12, FOLATE, FERRITIN, TIBC, IRON, RETICCTPCT in the last 72 hours. Urine analysis:    Component Value Date/Time   COLORURINE YELLOW 01/30/2012 [place] 01/30/2012 1215   LABSPEC 1.019 01/30/2012 1215   PHURINE 6.5 01/30/2012 1215   GLUCOSEU >1000 (A) 01/30/2012 1215   HGBUR NEGATIVE 01/30/2012 1215   BILIRUBINUR NEGATIVE 01/30/2012 1215   KETONESUR  NEGATIVE 01/30/2012 1215   PROTEINUR NEGATIVE 01/30/2012 1215   UROBILINOGEN 0.2 01/30/2012 1215   NITRITE NEGATIVE 01/30/2012 1215   LEUKOCYTESUR NEGATIVE 01/30/2012 1215    Radiological Exams on Admission: No results found.  EKG: Independently reviewed. Sinus tachycardia  Assessment/Plan Active Problems:   Bright red blood per rectum   HTN (hypertension)   DM2 (diabetes mellitus, type 2) (HCC)   PUD (peptic ulcer disease)     1. BRBPR - patient with two episodes BRBPR w/o pain. Exam not revealing. Stool heme positive. No UGI symptoms. Suspect diverticular bleed. Plan Med-surg observation  H/H q 6  Type and hold  Clear liquid diet  GI consult pending  2. HTN - patient has had meds changed recently by nephrology - no record accessible. She has not had her medication today. BP markedly elevated Plan Hydralazine 10 mg PO for SBP > 170  Continue home meds per med list - will adjust once meds confirmed (daughter will bring meds from home.)  3. DM2 - Patient recently taken off metformin (elevated Cr) Plan Continue amaryl  A1C  Sliding scale coverage.  4. PUD - h/o PUD, was on PPI but this was stopped recently. Presentation not c/w UGI bleed. Did get IV Prononix 80 mg bolus in ED. Plan PPI daily  DVT prophylaxis: SCDs  Code Status: full code  Family Communication: daughter present during exam. Understands Dx and Tx plan.  Disposition Plan: home when stable - expect 24-48 hrs  Consults called: GI- Dr. Verdia Kuba notified by ED-PA  Admission status: obs   Adella Hare MD [facility] Pager [phone]  If 7PM-7AM, please contact night-coverage [email or web address] Password [email or web address]  05/13/2021, 3:12 PM

## 2021-05-13 NOTE — Progress Notes (Signed)
Date and time results received: 05/13/21 5:50 PM Test: Hgb  Critical Value: 6.0  Name of Provider Notified: Dr. Linda Hedges  Orders Received? Or Actions Taken?: MD chatted via secure chat, awaiting orders.

## 2021-05-13 NOTE — Consult Note (Signed)
Reason for Consult: Rectal bleeding and anemia, Referring Physician: ER MD PCP: Dr. Nolene Ebbs.  Erica Whitney is an 80 y.o. female.  HPI: Erica Whitney is a 80 year old black female, with multiple medical problems listed below presented to emergency room at [facility] today with a history of rectal bleeding that started last night.  She had 1 bout of BRBPR yesterday after some loose stools and then again this morning. She had some epigastric pain but denies using any nonsteroidals. She has previous history of "bleeding ulcers" and has been on a PPI for several years but recently her PCP stopped her PPIs for reasons not clear to me.  She denies having any syncope or near syncopal events. She was last seen by Dr. Carol Ada in the office in 2015 when she had a colonoscopy done and was noted to have scattered diverticulosis of the entire colon and tubular adenoma and a sessile serrated adenoma removed from the ascending colon. I do not see any previous EGD report in the office notes. She denies having any problems with constipation and has 1-2 BMs per day. There is no history of dysphagia or odynophagia. There is no known family history of colon cancer.  Her sister had" stomach cancer"  Past Medical History:  Diagnosis Date  . Arthritis   . Breast cancer ([place])   . Breast cancer of upper-outer quadrant of right female breast ([place]) 11/06/2014  . Depression   . Diabetes mellitus   . Hypercholesteremia   . Hypertension   . Insomnia   . Peptic ulcer   . Uterine cancer ([place])    dx in her 36s  . Wears glasses    Past Surgical History:  Procedure Laterality Date  . ABDOMINAL HYSTERECTOMY  1995  . BACK SURGERY  2000   lumb lam  . BREAST LUMPECTOMY Right 2015  . COLONOSCOPY    . ORIF METACARPAL FRACTURE  2012   left  . RADIOACTIVE SEED GUIDED PARTIAL MASTECTOMY WITH AXILLARY SENTINEL LYMPH NODE BIOPSY Right 12/17/2014   Procedure: RIGHT BREAST RADIOACTIVE SEED LOCALIZED LUMPECTOMY AND  SENTINEL NODE MAPPING;  Surgeon: Autumn Messing III, MD;  Location: [facility];  Service: General;  Laterality: Right;   Family History  Problem Relation Age of Onset  . Cancer Mother   . Stomach cancer Mother 107  . Heart attack Father   . Cancer Brother   . Lung cancer Brother 45  . Breast cancer Sister 28   Social History:  reports that she quit smoking about 9 years ago. She smoked 0.25 packs per day. She has quit using smokeless tobacco.  Her smokeless tobacco use included snuff. She reports that she does not drink alcohol and does not use drugs.  Allergies: No Known Allergies  Medications: I have reviewed the patient's current medications.  Results for orders placed or performed during the hospital encounter of 05/13/21 (from the past 48 hour(s))  Comprehensive metabolic panel     Status: Abnormal   Collection Time: 05/13/21  9:03 AM  Result Value Ref Range   Sodium 140 135 - 145 mmol/L   Potassium 4.4 3.5 - 5.1 mmol/L   Chloride 109 98 - 111 mmol/L   CO2 20 (L) 22 - 32 mmol/L   Glucose, Bld 191 (H) 70 - 99 mg/dL    Comment: Glucose reference range applies only to samples taken after fasting for at least 8 hours.   BUN 43 (H) 8 - 23 mg/dL   Creatinine,  Ser 2.22 (H) 0.44 - 1.00 mg/dL   Calcium 8.9 8.9 - 10.3 mg/dL   Total Protein 6.6 6.5 - 8.1 g/dL   Albumin 3.4 (L) 3.5 - 5.0 g/dL   AST 47 (H) 15 - 41 U/L   ALT 72 (H) 0 - 44 U/L   Alkaline Phosphatase 79 38 - 126 U/L   Total Bilirubin 0.6 0.3 - 1.2 mg/dL   GFR, Estimated 22 (L) >60 mL/min    Comment: (NOTE) Calculated using the CKD-EPI Creatinine Equation (2021)    Anion gap 11 5 - 15    Comment: Performed at [facility] [address] [place], [place] [address]  CBC     Status: Abnormal   Collection Time: 05/13/21  9:03 AM  Result Value Ref Range   WBC 10.5 4.0 - 10.5 K/uL   RBC 2.64 (L) 3.87 - 5.11 MIL/uL   Hemoglobin 8.0 (L) 12.0 - 15.0 g/dL   HCT 26.0 (L) 36.0 - 46.0 %   MCV 98.5 80.0 -  100.0 fL   MCH 30.3 26.0 - 34.0 pg   MCHC 30.8 30.0 - 36.0 g/dL   RDW 14.0 11.5 - 15.5 %   Platelets 235 150 - 400 K/uL   nRBC 0.0 0.0 - 0.2 %    Comment: Performed at [facility], [place] [address] [place], [place] [address]  Type and screen [place]     Status: None   Collection Time: 05/13/21  9:06 AM  Result Value Ref Range   ABO/RH(D) O POS    Antibody Screen NEG    Sample Expiration      05/16/2021,2359 Performed at [facility] [address], [place], [place] [address]   POC occult blood, ED     Status: Abnormal   Collection Time: 05/13/21  9:50 AM  Result Value Ref Range   Fecal Occult Bld POSITIVE (A) NEGATIVE  Protime-INR     Status: None   Collection Time: 05/13/21  9:58 AM  Result Value Ref Range   Prothrombin Time 14.9 11.4 - 15.2 seconds   INR 1.2 0.8 - 1.2    Comment: (NOTE) INR goal varies based on device and disease states. Performed at [facility], [place] [address] [place], [place] [address]   Lipase, blood     Status: None   Collection Time: 05/13/21  9:58 AM  Result Value Ref Range   Lipase 30 11 - 51 U/L    Comment: Performed at [facility] [address] [place], [place] [address]  Resp Panel by RT-PCR (Flu A&B, Covid) Nasopharyngeal Swab     Status: None   Collection Time: 05/13/21 10:02 AM   Specimen: Nasopharyngeal Swab; Nasopharyngeal(NP) swabs in vial transport medium  Result Value Ref Range   SARS Coronavirus 2 by RT PCR NEGATIVE NEGATIVE    Comment: (NOTE) SARS-CoV-2 target nucleic acids are NOT DETECTED.  The SARS-CoV-2 RNA is generally detectable in upper respiratory specimens during the acute phase of infection. The lowest concentration of SARS-CoV-2 viral copies this assay can detect is 138 copies/mL. A negative result does not preclude SARS-Cov-2 infection and should not be used as the sole basis for treatment or other patient management decisions. A negative result may occur with   improper specimen collection/handling, submission of specimen other than nasopharyngeal swab, presence of viral mutation(s) within the areas targeted by this assay, and inadequate number of viral copies(<138 copies/mL). A negative result must  be combined with clinical observations, patient history, and epidemiological information. The expected result is Negative.  Fact Sheet for Patients:  EntrepreneurPulse.com.au  Fact Sheet for Healthcare Providers:  IncredibleEmployment.be  This test is no t yet approved or cleared by the [place] FDA and  has been authorized for detection and/or diagnosis of SARS-CoV-2 by FDA under an Emergency Use Authorization (EUA). This EUA will remain  in effect (meaning this test can be used) for the duration of the COVID-19 declaration under Section 564(b)(1) of the Act, 21 U.S.C.section 360bbb-3(b)(1), unless the authorization is terminated  or revoked sooner.       Influenza A by PCR NEGATIVE NEGATIVE   Influenza B by PCR NEGATIVE NEGATIVE    Comment: (NOTE) The Xpert Xpress SARS-CoV-2/FLU/RSV plus assay is intended as an aid in the diagnosis of influenza from Nasopharyngeal swab specimens and should not be used as a sole basis for treatment. Nasal washings and aspirates are unacceptable for Xpert Xpress SARS-CoV-2/FLU/RSV testing.  Fact Sheet for Patients: EntrepreneurPulse.com.au  Fact Sheet for Healthcare Providers: IncredibleEmployment.be  This test is not yet approved or cleared by the [place] FDA and has been authorized for detection and/or diagnosis of SARS-CoV-2 by FDA under an Emergency Use Authorization (EUA). This EUA will remain in effect (meaning this test can be used) for the duration of the COVID-19 declaration under Section 564(b)(1) of the Act, 21 U.S.C. section 360bbb-3(b)(1), unless the authorization is terminated or revoked.  Performed at  [facility], [place] [address] [place], [place] [address]     No results found.  Review of Systems  Constitutional: Negative for activity change, appetite change, chills, diaphoresis, fatigue and unexpected weight change.  HENT: Negative.   Eyes: Negative.   Respiratory: Negative.   Cardiovascular: Negative.   Gastrointestinal: Positive for abdominal pain, anal bleeding, blood in stool, diarrhea and nausea. Negative for constipation, rectal pain and vomiting.  Endocrine: Negative.   Genitourinary: Negative.   Musculoskeletal: Positive for arthralgias. Negative for back pain, gait problem, joint swelling, myalgias, neck pain and neck stiffness.  Skin: Negative.   Neurological: Negative.   Hematological: Negative.   Psychiatric/Behavioral: Negative.    Blood pressure (!) 181/91, pulse 77, temperature 99.5 F (37.5 C), resp. rate 16, SpO2 99 %. Physical Exam Constitutional:      General: She is not in acute distress.    Appearance: She is well-developed. She is obese. She is not ill-appearing or toxic-appearing.  HENT:     Head: Normocephalic and atraumatic.     Mouth/Throat:     Pharynx: Oropharynx is clear.  Eyes:     Extraocular Movements: Extraocular movements intact.     Pupils: Pupils are equal, round, and reactive to light.  Cardiovascular:     Rate and Rhythm: Normal rate and regular rhythm.     Heart sounds: Normal heart sounds.  Pulmonary:     Effort: Pulmonary effort is normal.     Breath sounds: Normal breath sounds.  Abdominal:     General: Bowel sounds are normal.     Palpations: Abdomen is soft.     Tenderness: There is abdominal tenderness in the epigastric area.     Comments: There is a midline scar below the umbilicus from previous hysterectomy  Skin:    General: Skin is warm and dry.  Neurological:     Mental Status: She is oriented to person, place, and time.  Psychiatric:        Mood and Affect: Mood normal.  Behavior: Behavior normal.    Assessment/Plan: 1) Rectal bleeding with chronic iron deficiency anemia-she had a previous history of peptic ulcer disease and therefore an EGD is planned for tomorrow to rule out recurrent ulcers.  She is a poor historian and is I am not sure whether she is taking any nonsteroidals recently or not. 2) Diverticulosis diagnosed on a colonoscopy in 2015. 3) History of GERD and peptic ulcer disease recently taken off of PPIs for reasons not clear to me/family history of stomach cancer.  4) Stage III CKD. 5) HTN/Hyperlipidemia/AODM/Obesity.  Juanita Craver 05/13/2021, 3:20 PM

## 2021-05-13 NOTE — ED Triage Notes (Signed)
Pt is here today due to abd pain & rectal bleeding. Pt has h/o rectal bleeding and low hemoglobin count . Pt reports the bleeding started this morning in large amounts. Pt is crying in triage.

## 2021-05-13 NOTE — ED Provider Notes (Signed)
[facility] EMERGENCY DEPARTMENT Provider Note   CSN: [id number] Arrival date & time: 05/13/21  0857     History Chief Complaint  Patient presents with  . Abdominal Pain  . Rectal Bleeding    Erica Whitney is a 80 y.o. female.  HPI   Patient presents with blood in the stool. This started last night - she noticed "a lot" of bright red blood in the toilet. She had crampy abdominal pain at the time. States this morning she had an another episode of blood in the stool. This has happened a few years ago and she was diagnosed with PUD. She went off her ulcer medicine a few months per the daughter. She is unsure why. She is not having nausea, vomiting, or constipation. She thinks she had diarrhea with the episode this morning but is not 100% sure.   Patient is in remission from breast cancer. Her HTN medicine was changed one month ago. She has not had medicine this morning.   Past Medical History:  Diagnosis Date  . Arthritis   . Breast cancer ([place])   . Breast cancer of upper-outer quadrant of right female breast ([place]) 11/06/2014  . Depression   . Diabetes mellitus   . Hypercholesteremia   . Hypertension   . Insomnia   . Peptic ulcer   . Uterine cancer ([place])    dx in her 31s  . Wears glasses     Patient Active Problem List   Diagnosis Date Noted  . Osteopenia 01/13/2015  . Uterine cancer ([place])   . Breast cancer of upper-outer quadrant of right female breast ([place]) 11/06/2014    Past Surgical History:  Procedure Laterality Date  . ABDOMINAL HYSTERECTOMY  1995  . BACK SURGERY  2000   lumb lam  . BREAST LUMPECTOMY Right 2015  . COLONOSCOPY    . ORIF METACARPAL FRACTURE  2012   left  . RADIOACTIVE SEED GUIDED PARTIAL MASTECTOMY WITH AXILLARY SENTINEL LYMPH NODE BIOPSY Right 12/17/2014   Procedure: RIGHT BREAST RADIOACTIVE SEED LOCALIZED LUMPECTOMY AND SENTINEL NODE MAPPING;  Surgeon: Autumn Messing III, MD;  Location: [facility];  Service:  General;  Laterality: Right;     OB History   No obstetric history on file.     Family History  Problem Relation Age of Onset  . Cancer Mother   . Stomach cancer Mother 75  . Heart attack Father   . Cancer Brother   . Lung cancer Brother 42  . Breast cancer Sister 33    Social History   Tobacco Use  . Smoking status: Former Smoker    Packs/day: 0.25    Quit date: 11/12/2011    Years since quitting: 9.5  . Smokeless tobacco: Former Systems developer    Types: Snuff  Substance Use Topics  . Alcohol use: No  . Drug use: No    Home Medications Prior to Admission medications   Medication Sig Start Date End Date Taking? Authorizing Provider  ACCU-CHEK GUIDE test strip  03/12/17   [provider]  AIMSCO INSULIN SYR ULTRA THIN 31G X 5/16" 0.3 ML MISC  03/12/17   [provider]  aspirin EC 81 MG tablet Take 81 mg by mouth daily.    [provider]  atorvastatin (LIPITOR) 40 MG tablet Take 40 mg by mouth daily.    [provider]  Blood Glucose Calibration (ACCU-CHEK GUIDE CONTROL) LIQD  03/12/17   [provider]  Calcium Carbonate-Vitamin D 600-400  MG-UNIT tablet Take 1 tablet by mouth daily.    [provider]  cycloSPORINE (RESTASIS) 0.05 % ophthalmic emulsion Place 1 drop into both eyes 2 (two) times daily.    [provider]  dextromethorphan-guaiFENesin (MUCINEX DM) 30-600 MG per 12 hr tablet Take 1 tablet by mouth 2 (two) times daily.    [provider]  glimepiride (AMARYL) 4 MG tablet Take 4 mg by mouth 2 (two) times daily with a meal.    [provider]  lisinopril-hydrochlorothiazide (PRINZIDE,ZESTORETIC) 10-12.5 MG per tablet Take 1 tablet by mouth daily.    [provider]  metFORMIN (GLUCOPHAGE) 1000 MG tablet As directed 11/03/15   [provider]  NIFEdipine (PROCARDIA XL/ADALAT-CC) 60 MG 24 hr tablet Take 60 mg by mouth daily.    [provider]  Omega-3 Fatty Acids  (FISH OIL) 1000 MG CAPS Take 1 capsule by mouth daily.    [provider]  pantoprazole (PROTONIX) 40 MG tablet Take 40 mg by mouth daily. 12/10/14   [provider]  sertraline (ZOLOFT) 100 MG tablet Take 100 mg by mouth every morning.    [provider]  triamcinolone cream (KENALOG) 0.5 %  11/17/14   [provider]  vitamin C (ASCORBIC ACID) 500 MG tablet Take 500 mg by mouth daily.    [provider]    Allergies    Patient has no known allergies.  Review of Systems   Review of Systems  Physical Exam Updated Vital Signs BP (!) 200/91 (BP Location: Right Arm)   Pulse (!) 105   Temp 99.5 F (37.5 C)   Resp 15   SpO2 97%   Physical Exam Vitals and nursing note reviewed. Exam conducted with a chaperone present.  Constitutional:      Appearance: Normal appearance.     Comments: Patient is tearful.   HENT:     Head: Normocephalic and atraumatic.  Eyes:     General: No scleral icterus.       Right eye: No discharge.        Left eye: No discharge.     Extraocular Movements: Extraocular movements intact.     Pupils: Pupils are equal, round, and reactive to light.  Cardiovascular:     Rate and Rhythm: Regular rhythm. Tachycardia present.     Pulses: Normal pulses.     Heart sounds: Murmur heard.  No friction rub. No gallop.      Comments: II/VI murmur at the 2nd right intercostal space.  Pulmonary:     Effort: Pulmonary effort is normal. No respiratory distress.     Breath sounds: Normal breath sounds.     Comments: Speaks in complete sentences. Lungs CTA bilaterally Abdominal:     General: Abdomen is flat. Bowel sounds are normal. There is no distension.     Palpations: Abdomen is soft.     Tenderness: There is generalized abdominal tenderness. There is no guarding or rebound.  Musculoskeletal:        General: Normal range of motion.     Comments: Surgical scar noted to lumbar vertebrae. No decubitus ulcer  Skin:    General:  Skin is warm and dry.     Coloration: Skin is not jaundiced.  Neurological:     Mental Status: She is alert. Mental status is at baseline.     Coordination: Coordination normal.  Psychiatric:        Mood and Affect: Mood normal.     ED Results /  Procedures / Treatments   Labs (all labs ordered are listed, but only abnormal results are displayed) Labs Reviewed  COMPREHENSIVE METABOLIC PANEL - Abnormal; Notable for the following components:      Result Value   CO2 20 (*)    Glucose, Bld 191 (*)    BUN 43 (*)    Creatinine, Ser 2.22 (*)    Albumin 3.4 (*)    AST 47 (*)    ALT 72 (*)    GFR, Estimated 22 (*)    All other components within normal limits  CBC - Abnormal; Notable for the following components:   RBC 2.64 (*)    Hemoglobin 8.0 (*)    HCT 26.0 (*)    All other components within normal limits  POC OCCULT BLOOD, ED - Abnormal; Notable for the following components:   Fecal Occult Bld POSITIVE (*)    All other components within normal limits  RESP PANEL BY RT-PCR (FLU A&B, COVID) ARPGX2  PROTIME-INR  LIPASE, BLOOD  TYPE AND SCREEN  ABO/RH    EKG None  Radiology No results found.  Procedures Procedures   Medications Ordered in ED Medications  sodium chloride 0.9 % bolus 500 mL (500 mLs Intravenous New Bag/Given 05/13/21 1000)  pantoprazole (PROTONIX) 80 mg in sodium chloride 0.9 % 100 mL IVPB (80 mg Intravenous New Bag/Given 05/13/21 1024)    ED Course  I have reviewed the triage vital signs and the nursing notes.  Pertinent labs & imaging results that were available during my care of the patient were reviewed by me and considered in my medical decision making (see chart for details).  Clinical Course as of 05/13/21 1127  Fri May 13, 2021  0947 RBC(!): 2.64 Decreased from baseline 3 months ago but not by much. Last RBC 3.03 [HS]  0948 Hemoglobin(!): 8.0 [HS]  0948 Decreased from baseline. Last RBC was 9.1 3 months ago.  [HS]  1093 Patient reports  feeling better with the fluids and pantoprazole.  [HS]  1123 BP(!): 206/85 [HS]  1123 Pulse Rate: 92 [HS]  1123 Her BP has been elevated during this visit. Suspect this is because she did not take her home BP medicines. Will hold off on treating BP for now given concern for GIB [HS]    Clinical Course User Index [HS] Sherrill Raring, PA-C   MDM Rules/Calculators/A&P                          Patient is tachycardic with elevated systolic and diastolic BP. She is tearful, and tender diffusely to the abdomen on exam without guarding. Hemoccult positive. High suspicion for GIB. Ddx includes gastric ulcers/PUD, AVM, diverticulosis, polyps, colon CA, hemorrhoids.   Basic labs ordered. EKG ordered due to tachycardia. Suspect this is secondary to dehydration and the blood loss. I've also ordered Protonix and a 500 ns fluid bolus.   She has a history of anemia but is decreased from baseline. She is followed by [facility] Dr. Collene Mares. He will consult on her.   Spoke with Dr. Virgina Organ and he will admit the patient.   Hemoglobin  Date Value Ref Range Status  05/13/2021 8.0 (L) 12.0 - 15.0 g/dL Final  02/03/2021 9.1 (L) 12.0 - 15.0 g/dL Final  01/27/2021 9.3 (L) 11.7 - 15.5 g/dL Final  11/05/2019 10.8 (L) 12.0 - 15.0 g/dL Final   HGB  Date Value Ref Range Status  11/15/2017 10.8 (L) 11.6 - 15.9 g/dL  Final  05/15/2017 11.0 (L) 11.6 - 15.9 g/dL Final  11/13/2016 10.9 (L) 11.6 - 15.9 g/dL Final  06/23/2016 10.8 (L) 11.6 - 15.9 g/dL Final   Final Clinical Impression(s) / ED Diagnoses Final diagnoses:  None    Rx / DC Orders ED Discharge Orders    None       Sherrill Raring, PA-C 05/13/21 [place], MD 05/16/21 1557

## 2021-05-14 ENCOUNTER — Observation Stay (HOSPITAL_COMMUNITY): Payer: Medicare Other | Admitting: Anesthesiology

## 2021-05-14 ENCOUNTER — Encounter (HOSPITAL_COMMUNITY): Payer: Self-pay | Admitting: Internal Medicine

## 2021-05-14 ENCOUNTER — Encounter (HOSPITAL_COMMUNITY)
Admission: EM | Disposition: A | Discharge status: Home / Self Care | Payer: Self-pay | Source: Home / Self Care | Attending: Student

## 2021-05-14 ENCOUNTER — Observation Stay: Payer: Self-pay

## 2021-05-14 DIAGNOSIS — M8580 Other specified disorders of bone density and structure, unspecified site: Secondary | ICD-10-CM | POA: Diagnosis present

## 2021-05-14 DIAGNOSIS — K264 Chronic or unspecified duodenal ulcer with hemorrhage: Secondary | ICD-10-CM | POA: Diagnosis present

## 2021-05-14 DIAGNOSIS — D509 Iron deficiency anemia, unspecified: Secondary | ICD-10-CM | POA: Diagnosis present

## 2021-05-14 DIAGNOSIS — Z20822 Contact with and (suspected) exposure to covid-19: Secondary | ICD-10-CM | POA: Diagnosis present

## 2021-05-14 DIAGNOSIS — E1165 Type 2 diabetes mellitus with hyperglycemia: Secondary | ICD-10-CM | POA: Diagnosis not present

## 2021-05-14 DIAGNOSIS — I10 Essential (primary) hypertension: Secondary | ICD-10-CM

## 2021-05-14 DIAGNOSIS — E1122 Type 2 diabetes mellitus with diabetic chronic kidney disease: Secondary | ICD-10-CM

## 2021-05-14 DIAGNOSIS — K625 Hemorrhage of anus and rectum: Secondary | ICD-10-CM | POA: Diagnosis present

## 2021-05-14 DIAGNOSIS — E1140 Type 2 diabetes mellitus with diabetic neuropathy, unspecified: Secondary | ICD-10-CM | POA: Diagnosis present

## 2021-05-14 DIAGNOSIS — Z9011 Acquired absence of right breast and nipple: Secondary | ICD-10-CM | POA: Diagnosis not present

## 2021-05-14 DIAGNOSIS — K922 Gastrointestinal hemorrhage, unspecified: Secondary | ICD-10-CM | POA: Diagnosis not present

## 2021-05-14 DIAGNOSIS — I129 Hypertensive chronic kidney disease with stage 1 through stage 4 chronic kidney disease, or unspecified chronic kidney disease: Secondary | ICD-10-CM | POA: Diagnosis present

## 2021-05-14 DIAGNOSIS — K317 Polyp of stomach and duodenum: Secondary | ICD-10-CM | POA: Diagnosis present

## 2021-05-14 DIAGNOSIS — F32A Depression, unspecified: Secondary | ICD-10-CM | POA: Diagnosis present

## 2021-05-14 DIAGNOSIS — N182 Chronic kidney disease, stage 2 (mild): Secondary | ICD-10-CM

## 2021-05-14 DIAGNOSIS — N1832 Chronic kidney disease, stage 3b: Secondary | ICD-10-CM | POA: Diagnosis present

## 2021-05-14 DIAGNOSIS — E7800 Pure hypercholesterolemia, unspecified: Secondary | ICD-10-CM | POA: Diagnosis present

## 2021-05-14 DIAGNOSIS — E785 Hyperlipidemia, unspecified: Secondary | ICD-10-CM | POA: Diagnosis present

## 2021-05-14 DIAGNOSIS — E872 Acidosis: Secondary | ICD-10-CM | POA: Diagnosis not present

## 2021-05-14 DIAGNOSIS — D631 Anemia in chronic kidney disease: Secondary | ICD-10-CM | POA: Diagnosis present

## 2021-05-14 DIAGNOSIS — K269 Duodenal ulcer, unspecified as acute or chronic, without hemorrhage or perforation: Secondary | ICD-10-CM | POA: Diagnosis not present

## 2021-05-14 DIAGNOSIS — D696 Thrombocytopenia, unspecified: Secondary | ICD-10-CM | POA: Diagnosis not present

## 2021-05-14 DIAGNOSIS — K315 Obstruction of duodenum: Secondary | ICD-10-CM | POA: Diagnosis present

## 2021-05-14 DIAGNOSIS — D62 Acute posthemorrhagic anemia: Secondary | ICD-10-CM | POA: Diagnosis present

## 2021-05-14 DIAGNOSIS — K5731 Diverticulosis of large intestine without perforation or abscess with bleeding: Secondary | ICD-10-CM | POA: Diagnosis present

## 2021-05-14 DIAGNOSIS — N179 Acute kidney failure, unspecified: Secondary | ICD-10-CM | POA: Diagnosis not present

## 2021-05-14 DIAGNOSIS — Z6830 Body mass index (BMI) 30.0-30.9, adult: Secondary | ICD-10-CM | POA: Diagnosis not present

## 2021-05-14 HISTORY — PX: BIOPSY: SHX5522

## 2021-05-14 HISTORY — PX: ESOPHAGOGASTRODUODENOSCOPY: SHX5428

## 2021-05-14 HISTORY — PX: HOT HEMOSTASIS: SHX5433

## 2021-05-14 HISTORY — PX: HEMOSTASIS CONTROL: SHX6838

## 2021-05-14 LAB — FERRITIN: Ferritin: 22 ng/mL (ref 11–307)

## 2021-05-14 LAB — RETICULOCYTES
Immature Retic Fract: 21.5 % — ABNORMAL HIGH (ref 2.3–15.9)
RBC.: 2.54 MIL/uL — ABNORMAL LOW (ref 3.87–5.11)
Retic Count, Absolute: 54.9 10*3/uL (ref 19.0–186.0)
Retic Ct Pct: 2.2 % (ref 0.4–3.1)

## 2021-05-14 LAB — VITAMIN B12: Vitamin B-12: 288 pg/mL (ref 180–914)

## 2021-05-14 LAB — GLUCOSE, CAPILLARY
Glucose-Capillary: 132 mg/dL — ABNORMAL HIGH (ref 70–99)
Glucose-Capillary: 129 mg/dL — ABNORMAL HIGH (ref 70–99)
Glucose-Capillary: 125 mg/dL — ABNORMAL HIGH (ref 70–99)
Glucose-Capillary: 97 mg/dL (ref 70–99)

## 2021-05-14 LAB — IRON AND TIBC
Iron: 140 ug/dL (ref 28–170)
Saturation Ratios: 60 % — ABNORMAL HIGH (ref 10.4–31.8)
TIBC: 235 ug/dL — ABNORMAL LOW (ref 250–450)
UIBC: 95 ug/dL

## 2021-05-14 LAB — HEMOGLOBIN AND HEMATOCRIT, BLOOD
HCT: 23.3 % — ABNORMAL LOW (ref 36.0–46.0)
HCT: 26.1 % — ABNORMAL LOW (ref 36.0–46.0)
Hemoglobin: 7.8 g/dL — ABNORMAL LOW (ref 12.0–15.0)
Hemoglobin: 8.4 g/dL — ABNORMAL LOW (ref 12.0–15.0)

## 2021-05-14 LAB — FOLATE: Folate: 59 ng/mL (ref >5.9)

## 2021-05-14 SURGERY — EGD (ESOPHAGOGASTRODUODENOSCOPY)
Anesthesia: Monitor Anesthesia Care

## 2021-05-14 SURGERY — COLONOSCOPY WITH PROPOFOL
Anesthesia: Monitor Anesthesia Care

## 2021-05-14 MED ORDER — SODIUM CHLORIDE 0.9 % IV SOLN
8.0000 mg/h | INTRAVENOUS | Status: DC
  Administered 2021-05-14 – 2021-05-15 (×4): 8 mg/h via INTRAVENOUS
  Filled 2021-05-14 (×4): qty 80

## 2021-05-14 MED ORDER — LISINOPRIL 20 MG PO TABS
20.0000 mg | ORAL_TABLET | Freq: Every day | ORAL | Status: DC
  Administered 2021-05-14: 20 mg via ORAL
  Filled 2021-05-14 (×2): qty 1

## 2021-05-14 MED ORDER — GABAPENTIN 100 MG PO CAPS
100.0000 mg | ORAL_CAPSULE | Freq: Two times a day (BID) | ORAL | Status: DC
  Administered 2021-05-14 – 2021-05-16 (×5): 100 mg via ORAL
  Filled 2021-05-14 (×5): qty 1

## 2021-05-14 MED ORDER — MIRTAZAPINE 15 MG PO TABS
30.0000 mg | ORAL_TABLET | Freq: Every day | ORAL | Status: DC
  Administered 2021-05-14 – 2021-05-15 (×2): 30 mg via ORAL
  Filled 2021-05-14 (×2): qty 2

## 2021-05-14 MED ORDER — PROPOFOL 500 MG/50ML IV EMUL
INTRAVENOUS | Status: DC | PRN
  Administered 2021-05-14: 100 ug/kg/min via INTRAVENOUS

## 2021-05-14 MED ORDER — HYDRALAZINE HCL 25 MG PO TABS
25.0000 mg | ORAL_TABLET | Freq: Three times a day (TID) | ORAL | Status: DC
  Administered 2021-05-14 – 2021-05-15 (×4): 25 mg via ORAL
  Filled 2021-05-14 (×6): qty 1

## 2021-05-14 MED ORDER — PROPOFOL 10 MG/ML IV BOLUS
INTRAVENOUS | Status: DC | PRN
  Administered 2021-05-14: 50 mg via INTRAVENOUS
  Administered 2021-05-14: 20 mg via INTRAVENOUS

## 2021-05-14 MED ORDER — LIDOCAINE 2% (20 MG/ML) 5 ML SYRINGE
INTRAMUSCULAR | Status: DC | PRN
  Administered 2021-05-14: 60 mg via INTRAVENOUS

## 2021-05-14 MED ORDER — EPINEPHRINE 1 MG/10ML IJ SOSY
PREFILLED_SYRINGE | INTRAMUSCULAR | Status: AC
  Filled 2021-05-14: qty 10

## 2021-05-14 MED ORDER — LISINOPRIL-HYDROCHLOROTHIAZIDE 20-12.5 MG PO TABS: 1.0000 | ORAL_TABLET | Freq: Every day | ORAL | Status: DC

## 2021-05-14 MED ORDER — PANTOPRAZOLE SODIUM 40 MG IV SOLR: 40.0000 mg | Freq: Two times a day (BID) | INTRAVENOUS | Status: DC

## 2021-05-14 MED ORDER — HYDROCHLOROTHIAZIDE 12.5 MG PO CAPS
12.5000 mg | ORAL_CAPSULE | Freq: Every day | ORAL | Status: DC
  Administered 2021-05-14: 12.5 mg via ORAL
  Filled 2021-05-14: qty 1

## 2021-05-14 MED ORDER — PEG-KCL-NACL-NASULF-NA ASC-C 100 G PO SOLR
1.0000 | ORAL | Status: AC
  Administered 2021-05-14: 200 g via ORAL
  Filled 2021-05-14: qty 1

## 2021-05-14 MED ORDER — SODIUM CHLORIDE 0.9 % IV SOLN: INTRAVENOUS | Status: DC

## 2021-05-14 MED ORDER — SUCCINYLCHOLINE CHLORIDE 200 MG/10ML IV SOSY
PREFILLED_SYRINGE | INTRAVENOUS | Status: DC | PRN
  Administered 2021-05-14: 100 mg via INTRAVENOUS

## 2021-05-14 MED ORDER — SODIUM CHLORIDE 0.9 % IV SOLN: INTRAVENOUS | Status: DC | PRN

## 2021-05-14 MED ORDER — FUROSEMIDE 40 MG PO TABS: 40.0000 mg | ORAL_TABLET | Freq: Every day | ORAL | Status: DC | PRN

## 2021-05-14 MED ORDER — LISINOPRIL-HYDROCHLOROTHIAZIDE 20-12.5 MG PO TABS: 1.0000 | ORAL_TABLET | Freq: Two times a day (BID) | ORAL | Status: DC

## 2021-05-14 MED ORDER — HYDRALAZINE HCL 20 MG/ML IJ SOLN
10.0000 mg | Freq: Once | INTRAMUSCULAR | Status: AC
  Administered 2021-05-14: 10 mg via INTRAVENOUS

## 2021-05-14 MED ORDER — METOPROLOL SUCCINATE ER 25 MG PO TB24
25.0000 mg | ORAL_TABLET | Freq: Every day | ORAL | Status: DC
  Administered 2021-05-14 – 2021-05-16 (×3): 25 mg via ORAL
  Filled 2021-05-14 (×3): qty 1

## 2021-05-14 MED ORDER — LABETALOL HCL 5 MG/ML IV SOLN
INTRAVENOUS | Status: DC | PRN
  Administered 2021-05-14: 5 mg via INTRAVENOUS

## 2021-05-14 MED ORDER — HYDRALAZINE HCL 20 MG/ML IJ SOLN
INTRAMUSCULAR | Status: AC
  Filled 2021-05-14: qty 1

## 2021-05-14 MED ORDER — SODIUM CHLORIDE 0.9 % IV SOLN
80.0000 mg | Freq: Once | INTRAVENOUS | Status: AC
  Administered 2021-05-14: 80 mg via INTRAVENOUS
  Filled 2021-05-14: qty 80

## 2021-05-14 MED ORDER — ONDANSETRON HCL 4 MG/2ML IJ SOLN
INTRAMUSCULAR | Status: DC | PRN
  Administered 2021-05-14: 4 mg via INTRAVENOUS

## 2021-05-14 MED ORDER — SODIUM CHLORIDE (PF) 0.9 % IJ SOLN
PREFILLED_SYRINGE | INTRAMUSCULAR | Status: DC | PRN
  Administered 2021-05-14: 2 mL

## 2021-05-14 SURGICAL SUPPLY — 22 items

## 2021-05-14 NOTE — Anesthesia Preprocedure Evaluation (Signed)
Anesthesia Evaluation  Patient identified by MRN, date of birth, ID band Patient awake    Reviewed: Allergy & Precautions, NPO status , Patient's Chart, lab work & pertinent test results, reviewed documented beta blocker date and time   Airway Mallampati: II  TM Distance: >3 FB Neck ROM: Full    Dental  (+) Dental Advisory Given, Missing   Pulmonary former smoker,    Pulmonary exam normal breath sounds clear to auscultation       Cardiovascular hypertension, Pt. on home beta blockers and Pt. on medications Normal cardiovascular exam Rhythm:Regular Rate:Normal     Neuro/Psych PSYCHIATRIC DISORDERS Depression negative neurological ROS     GI/Hepatic Neg liver ROS, PUD,   Endo/Other  diabetes, Type 2  Renal/GU negative Renal ROS     Musculoskeletal  (+) Arthritis ,   Abdominal   Peds  Hematology  (+) Blood dyscrasia, anemia ,   Anesthesia Other Findings Day of surgery medications reviewed with the patient.  Reproductive/Obstetrics                             Anesthesia Physical Anesthesia Plan  ASA: III  Anesthesia Plan: MAC   Post-op Pain Management:    Induction: Intravenous  PONV Risk Score and Plan: 2 and Propofol infusion and Treatment may vary due to age or medical condition  Airway Management Planned: Nasal Cannula and Natural Airway  Additional Equipment:   Intra-op Plan:   Post-operative Plan:   Informed Consent: I have reviewed the patients History and Physical, chart, labs and discussed the procedure including the risks, benefits and alternatives for the proposed anesthesia with the patient or authorized representative who has indicated his/her understanding and acceptance.     Dental advisory given  Plan Discussed with: CRNA and Anesthesiologist  Anesthesia Plan Comments:         Anesthesia Quick Evaluation

## 2021-05-14 NOTE — Progress Notes (Signed)
Patient had a total of dark red loose stools during this shift.

## 2021-05-14 NOTE — Anesthesia Postprocedure Evaluation (Signed)
Anesthesia Post Note  Patient: Erica Whitney  Procedure(s) Performed: ESOPHAGOGASTRODUODENOSCOPY (EGD) (N/A ) COLONOSCOPY WITH PROPOFOL (N/A ) HOT HEMOSTASIS (ARGON PLASMA COAGULATION/BICAP) (N/A ) HEMOSTASIS CONTROL BIOPSY     Patient location during evaluation: Endoscopy Anesthesia Type: General Level of consciousness: awake and alert Pain management: pain level controlled Vital Signs Assessment: post-procedure vital signs reviewed and stable Respiratory status: spontaneous breathing, nonlabored ventilation, respiratory function stable and patient connected to nasal cannula oxygen Cardiovascular status: blood pressure returned to baseline and stable Postop Assessment: no apparent nausea or vomiting Anesthetic complications: no   No complications documented.  Last Vitals:  Vitals:   05/14/21 1533 05/14/21 1554  BP: (!) 196/65 (!) 153/65  Pulse:    Resp: 18 14  Temp:  (!) 36.4 C  SpO2: 97% 94%    Last Pain:  Vitals:   05/14/21 1554  TempSrc: Oral  PainSc: 0-No pain                 Catalina Gravel

## 2021-05-14 NOTE — Progress Notes (Signed)
Order for PICC placed at 1256, per Dr Loletha Carrow notes, pt in procedure at 1345 and anesthesia to obtain PIV or CVC.  Secure chat sent to Dr Cyndia Skeeters re d/c PICC order.  Reply states he will cancel.

## 2021-05-14 NOTE — Op Note (Signed)
[facility] Patient Name: Erica Whitney Procedure Date : 05/14/2021 MRN: [id number] Attending MD: Starr Lake. Myrtie Neither , MD Date of Birth: [birth year] CSN: [id number] Age: 80 Admit Type: Inpatient Procedure:                Upper GI endoscopy Indications:              Acute post hemorrhagic anemia, Hematochezia Providers:                Sherilyn Cooter L. Myrtie Neither, MD, Margaree Mackintosh, RN,                            Leanne Lovely, Technician, Gwenyth Allegra CRNA, Referring MD:             [facility] Medicines:                Monitored Anesthesia Care initially, then converted                            to GETA for airway protection Complications:            No immediate complications. Estimated Blood Loss:     Estimated blood loss was minimal. Procedure:                Pre-Anesthesia Assessment:                           - Prior to the procedure, a History and Physical                            was performed, and patient medications and                            allergies were reviewed. The patient's tolerance of                            previous anesthesia was also reviewed. The risks                            and benefits of the procedure and the sedation                            options and risks were discussed with the patient.                            All questions were answered, and informed consent                            was obtained. Prior Anticoagulants: The patient has                            taken no previous anticoagulant or antiplatelet                            agents. ASA Grade Assessment: IV - A patient with  severe systemic disease that is a constant threat                            to life. After reviewing the risks and benefits,                            the patient was deemed in satisfactory condition to                            undergo the procedure.                           After obtaining informed  consent, the endoscope was                            passed under direct vision. Throughout the                            procedure, the patient's blood pressure, pulse, and                            oxygen saturations were monitored continuously. The                            GIF-H190 ([id number]) Olympus gastroscope was                            introduced through the mouth, and advanced to the                            second part of duodenum. The upper GI endoscopy was                            accomplished without difficulty. The patient                            tolerated the procedure well. Scope In: Scope Out: Findings:      The esophagus was normal.      Red blood and clots were found in the entire examined stomach. Cleared       with suction - no gastric bleeding source.      Several biopsies were obtained on the greater curvature of the gastric       body, on the lesser curvature of the gastric body, on the greater       curvature of the gastric antrum and on the lesser curvature of the       gastric antrum with cold forceps for histology (to r/o H pylori b/c DU).      After blood discovered in stomach and initial EGD performed, the scope       was removed and the patient intubated for airway protection in order to       make intervention on bleeding lesion.      Multiple diminutive sessile fundic gland polyps were found in the       gastric fundus and in the gastric body.  The cardia and gastric fundus were normal on retroflexion.      An acquired benign-appearing, intrinsic mild stenosis was found in the       duodenal bulb and was traversed.      On the stricture. one non-bleeding superficial duodenal ulcer with no       stigmata of bleeding was found in the duodenal bulb. The lesion was 5 mm       in largest dimension.      On the distal aspect of the stricture (postero-lateral, difficult spot       to see) there was one oozing superficial duodenal ulcer was  found in the       duodenal bulb. The lesion was 6 mm in largest dimension. Area was       successfully injected with 2 mL of a 1:10,000 solution of epinephrine       for hemostasis. Coagulation for hemostasis using bipolar probe was       successful.      The exam of the duodenum was otherwise normal. Impression:               - Normal esophagus.                           - Red blood in the entire stomach.                           - Multiple fundic gland polyps.                           - Acquired duodenal stenosis.                           - Non-bleeding duodenal ulcer with no stigmata of                            bleeding.                           - Oozing duodenal ulcer. Injected. Treated with                            bipolar cautery.                           - Several biopsies were obtained on the greater                            curvature of the gastric body, on the lesser                            curvature of the gastric body, on the greater                            curvature of the gastric antrum and on the lesser                            curvature of the gastric antrum.  Planned colonoscopy not performed since bleeding                            source identified. Recommendation:           - Return patient to hospital ward for ongoing care.                           - Clear liquid diet.                           - Continue present medications, including protonix                            drip.                           - Discontinue aspirin.                           Hgb/Hct this evening and CBC in AM                           - Await pathology results. Procedure Code(s):        --- Professional ---                           [phone], 59, Esophagogastroduodenoscopy, flexible,                            transoral; with control of bleeding, any method                           43239, Esophagogastroduodenoscopy, flexible,                             transoral; with biopsy, single or multiple Diagnosis Code(s):        --- Professional ---                           K92.2, Gastrointestinal hemorrhage, unspecified                           K31.7, Polyp of stomach and duodenum                           K31.5, Obstruction of duodenum                           K26.9, Duodenal ulcer, unspecified as acute or                            chronic, without hemorrhage or perforation                           K26.4, Chronic or unspecified duodenal ulcer with  hemorrhage                           D62, Acute posthemorrhagic anemia                           K92.1, Melena (includes Hematochezia) CPT copyright 2019 American Medical Association. All rights reserved. The codes documented in this report are preliminary and upon coder review may  be revised to meet current compliance requirements. Greysin Medlen L. Myrtie Neither, MD 05/14/2021 3:13:43 PM This report has been signed electronically. Number of Addenda: 0

## 2021-05-14 NOTE — H&P (View-Only) (Signed)
I am covering this weekend for Drs. Mann/Hung, and document with this patient on for an upper endoscopy with me today. I reviewed her consult note, and I spoke with the patient and examined her.  Patient has had a couple of days of passing dark blood prior to hospital admission, somewhat difficult to characterize.  She has a known history of diverticulosis based on the consult note, outpatient colonoscopy report not presently available.  Was reportedly done in 2015.  Patient has CKD and was admitted with a hemoglobin of 8.0, down from her last known baseline of 9.1, reportedly history of chronic anemia. However, she had 5 or 6 episodes of passing maroon blood with clots overnight, confirmed by speaking with the staff over here.  Last episode of bleeding was about 2 hours ago.  Patient has remained hypertensive overnight.  Hemoglobin dropped down to 6, and she received 2 units of PRBCs finishing about 2:30 AM.  Repeat hemoglobin check at 5:30 AM was 7.8.  Admission INR normal at 1.2.  I did a rectal exam in the presence of the patient's nurse, and there was loose black stool.  Patient is alert and conversational and in no acute distress.  Vital signs stable right now.  I suspect this patient is having a lower GI bleed, possibly right-sided colonic diverticular source.  Upper GI bleed seems less likely given her hemodynamic stability.  She cannot have a CT angiogram because of her CKD.  Change of plan as follows:  Protonix bolus and drip Nursing to work on getting a second peripheral IV Recheck hemoglobin hematocrit at noon Patient will have 1 bottle of MoviPrep this morning as a rapid prep for colonoscopy along with her upper endoscopy later today.   Wilfrid Lund, MD    [facility]

## 2021-05-14 NOTE — Progress Notes (Addendum)
I am covering this weekend for Drs. Mann/Hung, and document with this patient on for an upper endoscopy with me today. I reviewed her consult note, and I spoke with the patient and examined her.  Patient has had a couple of days of passing dark blood prior to hospital admission, somewhat difficult to characterize.  She has a known history of diverticulosis based on the consult note, outpatient colonoscopy report not presently available.  Was reportedly done in 2015.  Patient has CKD and was admitted with a hemoglobin of 8.0, down from her last known baseline of 9.1, reportedly history of chronic anemia. However, she had 5 or 6 episodes of passing maroon blood with clots overnight, confirmed by speaking with the staff over here.  Last episode of bleeding was about 2 hours ago.  Patient has remained hypertensive overnight.  Hemoglobin dropped down to 6, and she received 2 units of PRBCs finishing about 2:30 AM.  Repeat hemoglobin check at 5:30 AM was 7.8.  Admission INR normal at 1.2.  I did a rectal exam in the presence of the patient's nurse, and there was loose black stool.  Patient is alert and conversational and in no acute distress.  Vital signs stable right now.  I suspect this patient is having a lower GI bleed, possibly right-sided colonic diverticular source.  Upper GI bleed seems less likely given her hemodynamic stability.  She cannot have a CT angiogram because of her CKD.  Change of plan as follows:  Protonix bolus and drip Nursing to work on getting a second peripheral IV Recheck hemoglobin hematocrit at noon Patient will have 1 bottle of MoviPrep this morning as a rapid prep for colonoscopy along with her upper endoscopy later today.   Wilfrid Lund, MD    [facility]

## 2021-05-14 NOTE — Interval H&P Note (Signed)
History and Physical Interval Note:  05/14/2021 1:58 PM  Erica Whitney  has presented today for surgery, with the diagnosis of Rectal bleeding with epigastric pain and anemia.  The various methods of treatment have been discussed with the patient and family. After consideration of risks, benefits and other options for treatment, the patient has consented to  Procedure(s): ESOPHAGOGASTRODUODENOSCOPY (EGD) (N/A) COLONOSCOPY WITH PROPOFOL (N/A) as a surgical intervention.  The patient's history has been reviewed, patient examined, no change in status, stable for surgery.  I have reviewed the patient's chart and labs.  Questions were answered to the patient's satisfaction.    Patient remains hypertensive.  Took one bottle moviprep Midday hemoglobin stable at 8.4  Nursing and IV team unable to place 2nd IV - anesthesia will attempt peripheral IV or place central line if needed. [name]

## 2021-05-14 NOTE — Progress Notes (Signed)
2nd unit of blood transfusion completed without any rxn. Patient tolerated it well, will continue to monitor.

## 2021-05-14 NOTE — Anesthesia Procedure Notes (Signed)
Procedure Name: Intubation Date/Time: 05/14/2021 2:36 PM Performed by: Eligha Bridegroom, CRNA Pre-anesthesia Checklist: Patient identified Patient Re-evaluated:Patient Re-evaluated prior to induction Oxygen Delivery Method: Circle system utilized Preoxygenation: Pre-oxygenation with 100% oxygen Induction Type: IV induction Ventilation: Mask ventilation without difficulty Grade View: Grade I Tube type: Oral Tube size: 7.0 mm Number of attempts: 1 Airway Equipment and Method: Stylet Placement Confirmation: ETT inserted through vocal cords under direct vision,  positive ETCO2 and breath sounds checked- equal and bilateral Secured at: 21 cm Tube secured with: Tape Dental Injury: Teeth and Oropharynx as per pre-operative assessment

## 2021-05-14 NOTE — Progress Notes (Signed)
PROGRESS NOTE  Erica Whitney MWN:[id number] DOB: [birth year]   PCP: Fleet Contras, MD  Patient is from: Home  DOA: 05/13/2021 LOS: 0  Chief complaints: Rectal bleed  Brief Narrative / Interim history: 80 year old F with PMH of DM-2, HTN, PUD, diverticulosis, iron deficiency anemia, CKD-3 and HLD presenting with 2 episodes of bright red blood per rectum on 5/26 and 5/27, and admitted for acute blood loss anemia due to rectal bleed.  Hgb 8.0 from baseline of 9.1.  Hemoccult positive.  Started on IV Protonix.  GI consulted, and patient was admitted.  Patient was on low-dose aspirin prior to presentation.  Subjective: Seen and examined earlier this morning.  Per nursing report, patient had 2 episodes of dark red soft stool yesterday evening and earlier this morning.  Hemoglobin dropped further to 6.0.  She was transfused 2 units with appropriate response.  Otherwise, no complaints.  She denies chest pain, dyspnea, dizziness or fatigue.  She says she had some pain across lower abdomen when she came to the ED that has resolved.  She reports taking baby aspirin but no other NSAID or blood thinners.  Patient's daughter at bedside, contributed to story.  Objective: Vitals:   05/14/21 0215 05/14/21 0455 05/14/21 0600 05/14/21 0836  BP: (!) 180/81 (!) 185/85  (!) 184/86  Pulse: 75 88    Resp: 20 17  18   Temp: 98.7 F (37.1 C) 98.5 F (36.9 C)  98 F (36.7 C)  TempSrc: Oral Oral  Oral  SpO2: 99% 100%  96%  Weight:   78.7 kg   Height:   5\' 3"  (1.6 m)     Intake/Output Summary (Last 24 hours) at 05/14/2021 1156 Last data filed at 05/14/2021 1030 Gross per 24 hour  Intake 630 ml  Output 1 ml  Net 629 ml   Filed Weights   05/14/21 0600  Weight: 78.7 kg    Examination:  GENERAL: No apparent distress.  Nontoxic. HEENT: MMM.  Vision and hearing grossly intact.  NECK: Supple.  No apparent JVD.  RESP:  No IWOB.  Fair aeration bilaterally. CVS:  RRR. Heart sounds normal.  ABD/GI/GU:  BS+. Abd soft, NTND.  MSK/EXT:  Moves extremities. No apparent deformity. No edema.  SKIN: no apparent skin lesion or wound NEURO: Awake, alert and oriented appropriately.  No apparent focal neuro deficit. PSYCH: Calm. Normal affect.   Procedures:  None  Microbiology summarized: COVID-19 and influenza PCR nonreactive.  Assessment & Plan: ABLA inpatient with IDA likely due to diverticular bleed-she has history of PUD which also makes UGI possible. She had 2 episodes of dark bloody stool since admission Recent Labs    01/27/21 0950 02/03/21 1029 05/13/21 0903 05/13/21 1628 05/14/21 0535  HGB 9.3* 9.1* 8.0* 6.0* 7.8*  -Transfused 2 units with appropriate response. -GI following-plan for EGD and colonoscopy today -Discontinue aspirin indefinitely.  I do not see the indication -Continue PPI -Secure to PIV -Monitor H&H -Optimize BP  Uncontrolled HTN: BP in 180s/80s.  Home meds were on hold due to GI bleed -Resume home medications  NIDDM-2?  Does not seem to be on diabetic medication.  She has mild hyperglycemia. Recent Labs  Lab 05/13/21 1703 05/13/21 2149 05/14/21 0559 05/14/21 1129  GLUCAP 97 100* 132* 129*  -Check hemoglobin A1c -Continue SSI-renal  History of depression: Stable -Continue home Zoloft  Neuropathy -Continue home gabapentin  History of stage I breast cancer s/p lumpectomy and localized radioactive seed in 2015.  In remission  Class I obesity  Body mass index is 30.73 kg/m.         DVT prophylaxis:  SCDs Start: 05/13/21 1444  Code Status: Full code Family Communication: Updated patient's daughter at bedside Level of care: Med-Surg Status is: Observation  The patient will require care spanning > 2 midnights and should be moved to inpatient because: Hemodynamically unstable, Ongoing diagnostic testing needed not appropriate for outpatient work up, IV treatments appropriate due to intensity of illness or inability to take PO, Inpatient level  of care appropriate due to severity of illness and Requires close monitoring of H&H for bleeding  Dispo: The patient is from: Home              Anticipated d/c is to: Home              Patient currently is not medically stable to d/c.   Difficult to place patient No       Consultants:  Gastroenterology   Sch Meds:  Scheduled Meds: . atorvastatin  40 mg Oral q1800  . gabapentin  100 mg Oral BID  . hydrALAZINE  25 mg Oral TID  . lisinopril  20 mg Oral Daily   And  . hydrochlorothiazide  12.5 mg Oral Daily  . insulin aspart  0-15 Units Subcutaneous TID WC  . metoprolol succinate  25 mg Oral Daily  . mirtazapine  30 mg Oral QHS  . [START ON 05/17/2021] pantoprazole  40 mg Intravenous Q12H  . sertraline  100 mg Oral Daily   Continuous Infusions: . pantoprozole (PROTONIX) infusion 8 mg/hr (05/14/21 1110)   PRN Meds:.acetaminophen **OR** acetaminophen, furosemide, hydrALAZINE  Antimicrobials: Anti-infectives (From admission, onward)   None       I have personally reviewed the following labs and images: CBC: Recent Labs  Lab 05/13/21 0903 05/13/21 1628 05/14/21 0535  WBC 10.5  --   --   HGB 8.0* 6.0* 7.8*  HCT 26.0* 19.7* 23.3*  MCV 98.5  --   --   PLT 235  --   --    BMP &GFR Recent Labs  Lab 05/13/21 0903  NA 140  K 4.4  CL 109  CO2 20*  GLUCOSE 191*  BUN 43*  CREATININE 2.22*  CALCIUM 8.9   Estimated Creatinine Clearance: 20.7 mL/min (A) (by C-G formula based on SCr of 2.22 mg/dL (H)). Liver & Pancreas: Recent Labs  Lab 05/13/21 0903  AST 47*  ALT 72*  ALKPHOS 79  BILITOT 0.6  PROT 6.6  ALBUMIN 3.4*   Recent Labs  Lab 05/13/21 0958  LIPASE 30   No results for input(s): AMMONIA in the last 168 hours. Diabetic: No results for input(s): HGBA1C in the last 72 hours. Recent Labs  Lab 05/13/21 1703 05/13/21 2149 05/14/21 0559 05/14/21 1129  GLUCAP 97 100* 132* 129*   Cardiac Enzymes: No results for input(s): CKTOTAL, CKMB,  CKMBINDEX, TROPONINI in the last 168 hours. No results for input(s): PROBNP in the last 8760 hours. Coagulation Profile: Recent Labs  Lab 05/13/21 0958  INR 1.2   Thyroid Function Tests: No results for input(s): TSH, T4TOTAL, FREET4, T3FREE, THYROIDAB in the last 72 hours. Lipid Profile: No results for input(s): CHOL, HDL, LDLCALC, TRIG, CHOLHDL, LDLDIRECT in the last 72 hours. Anemia Panel: Recent Labs    05/14/21 0535  VITAMINB12 288  FERRITIN 22  TIBC 235*  IRON 140  RETICCTPCT 2.2   Urine analysis:    Component Value Date/Time   COLORURINE YELLOW 01/30/2012 1215   APPEARANCEUR CLEAR  01/30/2012 1215   LABSPEC 1.019 01/30/2012 1215   PHURINE 6.5 01/30/2012 1215   GLUCOSEU >1000 (A) 01/30/2012 1215   HGBUR NEGATIVE 01/30/2012 1215   BILIRUBINUR NEGATIVE 01/30/2012 1215   KETONESUR NEGATIVE 01/30/2012 1215   PROTEINUR NEGATIVE 01/30/2012 1215   UROBILINOGEN 0.2 01/30/2012 1215   NITRITE NEGATIVE 01/30/2012 1215   LEUKOCYTESUR NEGATIVE 01/30/2012 1215   Sepsis Labs: Invalid input(s): PROCALCITONIN, LACTICIDVEN  Microbiology: Recent Results (from the past 240 hour(s))  Resp Panel by RT-PCR (Flu A&B, Covid) Nasopharyngeal Swab     Status: None   Collection Time: 05/13/21 10:02 AM   Specimen: Nasopharyngeal Swab; Nasopharyngeal(NP) swabs in vial transport medium  Result Value Ref Range Status   SARS Coronavirus 2 by RT PCR NEGATIVE NEGATIVE Final    Comment: (NOTE) SARS-CoV-2 target nucleic acids are NOT DETECTED.  The SARS-CoV-2 RNA is generally detectable in upper respiratory specimens during the acute phase of infection. The lowest concentration of SARS-CoV-2 viral copies this assay can detect is 138 copies/mL. A negative result does not preclude SARS-Cov-2 infection and should not be used as the sole basis for treatment or other patient management decisions. A negative result may occur with  improper specimen collection/handling, submission of specimen  other than nasopharyngeal swab, presence of viral mutation(s) within the areas targeted by this assay, and inadequate number of viral copies(<138 copies/mL). A negative result must be combined with clinical observations, patient history, and epidemiological information. The expected result is Negative.  Fact Sheet for Patients:  BloggerCourse.com  Fact Sheet for Healthcare Providers:  SeriousBroker.it  This test is no t yet approved or cleared by the [place] FDA and  has been authorized for detection and/or diagnosis of SARS-CoV-2 by FDA under an Emergency Use Authorization (EUA). This EUA will remain  in effect (meaning this test can be used) for the duration of the COVID-19 declaration under Section 564(b)(1) of the Act, 21 U.S.C.section 360bbb-3(b)(1), unless the authorization is terminated  or revoked sooner.       Influenza A by PCR NEGATIVE NEGATIVE Final   Influenza B by PCR NEGATIVE NEGATIVE Final    Comment: (NOTE) The Xpert Xpress SARS-CoV-2/FLU/RSV plus assay is intended as an aid in the diagnosis of influenza from Nasopharyngeal swab specimens and should not be used as a sole basis for treatment. Nasal washings and aspirates are unacceptable for Xpert Xpress SARS-CoV-2/FLU/RSV testing.  Fact Sheet for Patients: BloggerCourse.com  Fact Sheet for Healthcare Providers: SeriousBroker.it  This test is not yet approved or cleared by the [place] FDA and has been authorized for detection and/or diagnosis of SARS-CoV-2 by FDA under an Emergency Use Authorization (EUA). This EUA will remain in effect (meaning this test can be used) for the duration of the COVID-19 declaration under Section 564(b)(1) of the Act, 21 U.S.C. section 360bbb-3(b)(1), unless the authorization is terminated or revoked.  Performed at [facility], [address], [place],  [place] [address]     Radiology Studies: No results found.   Chamika Cunanan T. Rande Roylance [facility]  If 7PM-7AM, please contact night-coverage [email or web address] 05/14/2021, 11:56 AM

## 2021-05-14 NOTE — Transfer of Care (Signed)
Immediate Anesthesia Transfer of Care Note  Patient: Erica Whitney  Procedure(s) Performed: ESOPHAGOGASTRODUODENOSCOPY (EGD) (N/A ) COLONOSCOPY WITH PROPOFOL (N/A ) HOT HEMOSTASIS (ARGON PLASMA COAGULATION/BICAP) (N/A ) HEMOSTASIS CONTROL BIOPSY  Patient Location: Endoscopy Unit  Anesthesia Type:General  Level of Consciousness: awake and alert   Airway & Oxygen Therapy: Patient Spontanous Breathing and Patient connected to nasal cannula oxygen  Post-op Assessment: Report given to RN, Post -op Vital signs reviewed and stable and Patient moving all extremities X 4  Post vital signs: Reviewed and stable  Last Vitals:  Vitals Value Taken Time  BP 162/52 05/14/21 1513  Temp    Pulse 66 05/14/21 1515  Resp 20 05/14/21 1515  SpO2 98 % 05/14/21 1515  Vitals shown include unvalidated device data.  Last Pain:  Vitals:   05/14/21 1513  TempSrc:   PainSc: 0-No pain         Complications: No complications documented.

## 2021-05-15 ENCOUNTER — Other Ambulatory Visit: Payer: Self-pay

## 2021-05-15 DIAGNOSIS — K317 Polyp of stomach and duodenum: Secondary | ICD-10-CM

## 2021-05-15 DIAGNOSIS — E1122 Type 2 diabetes mellitus with diabetic chronic kidney disease: Secondary | ICD-10-CM | POA: Diagnosis not present

## 2021-05-15 DIAGNOSIS — N179 Acute kidney failure, unspecified: Secondary | ICD-10-CM

## 2021-05-15 DIAGNOSIS — K922 Gastrointestinal hemorrhage, unspecified: Secondary | ICD-10-CM | POA: Diagnosis not present

## 2021-05-15 DIAGNOSIS — K264 Chronic or unspecified duodenal ulcer with hemorrhage: Principal | ICD-10-CM

## 2021-05-15 DIAGNOSIS — K315 Obstruction of duodenum: Secondary | ICD-10-CM

## 2021-05-15 DIAGNOSIS — D62 Acute posthemorrhagic anemia: Secondary | ICD-10-CM | POA: Diagnosis not present

## 2021-05-15 DIAGNOSIS — N189 Chronic kidney disease, unspecified: Secondary | ICD-10-CM

## 2021-05-15 DIAGNOSIS — K625 Hemorrhage of anus and rectum: Secondary | ICD-10-CM | POA: Diagnosis not present

## 2021-05-15 LAB — RENAL FUNCTION PANEL
Albumin: 2.8 g/dL — ABNORMAL LOW (ref 3.5–5.0)
Anion gap: 7 (ref 5–15)
BUN: 48 mg/dL — ABNORMAL HIGH (ref 8–23)
CO2: 19 mmol/L — ABNORMAL LOW (ref 22–32)
Calcium: 8.3 mg/dL — ABNORMAL LOW (ref 8.9–10.3)
Chloride: 114 mmol/L — ABNORMAL HIGH (ref 98–111)
Creatinine, Ser: 2.56 mg/dL — ABNORMAL HIGH (ref 0.44–1.00)
GFR, Estimated: 19 mL/min — ABNORMAL LOW (ref >60)
Glucose, Bld: 116 mg/dL — ABNORMAL HIGH (ref 70–99)
Phosphorus: 5.4 mg/dL — ABNORMAL HIGH (ref 2.5–4.6)
Potassium: 4.5 mmol/L (ref 3.5–5.1)
Sodium: 140 mmol/L (ref 135–145)

## 2021-05-15 LAB — GLUCOSE, CAPILLARY
Glucose-Capillary: 111 mg/dL — ABNORMAL HIGH (ref 70–99)
Glucose-Capillary: 137 mg/dL — ABNORMAL HIGH (ref 70–99)
Glucose-Capillary: 124 mg/dL — ABNORMAL HIGH (ref 70–99)
Glucose-Capillary: 101 mg/dL — ABNORMAL HIGH (ref 70–99)

## 2021-05-15 LAB — BPAM RBC
Blood Product Expiration Date: 202206022359
Blood Product Expiration Date: 202206222359
ISSUE DATE / TIME: 202205271835
ISSUE DATE / TIME: 202205272307
Unit Type and Rh: 5100
Unit Type and Rh: 5100

## 2021-05-15 LAB — TYPE AND SCREEN
ABO/RH(D): O POS
Antibody Screen: NEGATIVE
Unit division: 0
Unit division: 0

## 2021-05-15 LAB — CBC
HCT: 22.4 % — ABNORMAL LOW (ref 36.0–46.0)
HCT: 22.7 % — ABNORMAL LOW (ref 36.0–46.0)
Hemoglobin: 7 g/dL — ABNORMAL LOW (ref 12.0–15.0)
Hemoglobin: 7.1 g/dL — ABNORMAL LOW (ref 12.0–15.0)
MCH: 29.7 pg (ref 26.0–34.0)
MCH: 30.2 pg (ref 26.0–34.0)
MCHC: 31.3 g/dL (ref 30.0–36.0)
MCHC: 31.3 g/dL (ref 30.0–36.0)
MCV: 94.9 fL (ref 80.0–100.0)
MCV: 96.6 fL (ref 80.0–100.0)
Platelets: 145 10*3/uL — ABNORMAL LOW (ref 150–400)
Platelets: 152 10*3/uL (ref 150–400)
RBC: 2.36 MIL/uL — ABNORMAL LOW (ref 3.87–5.11)
RBC: 2.35 MIL/uL — ABNORMAL LOW (ref 3.87–5.11)
RDW: 16.6 % — ABNORMAL HIGH (ref 11.5–15.5)
RDW: 16.4 % — ABNORMAL HIGH (ref 11.5–15.5)
WBC: 9.1 10*3/uL (ref 4.0–10.5)
WBC: 9.2 10*3/uL (ref 4.0–10.5)
nRBC: 0.2 % (ref 0.0–0.2)
nRBC: 0.2 % (ref 0.0–0.2)

## 2021-05-15 LAB — MAGNESIUM: Magnesium: 1.6 mg/dL — ABNORMAL LOW (ref 1.7–2.4)

## 2021-05-15 LAB — PREPARE RBC (CROSSMATCH)

## 2021-05-15 MED ORDER — MAGNESIUM SULFATE 2 GM/50ML IV SOLN
2.0000 g | Freq: Once | INTRAVENOUS | Status: AC
  Administered 2021-05-15: 2 g via INTRAVENOUS
  Filled 2021-05-15: qty 50

## 2021-05-15 MED ORDER — SODIUM CHLORIDE 0.9% IV SOLUTION: Freq: Once | INTRAVENOUS | Status: AC

## 2021-05-15 MED ORDER — PHENOL 1.4 % MT LIQD: 1.0000 | OROMUCOSAL | Status: DC | PRN

## 2021-05-15 MED ORDER — PANTOPRAZOLE SODIUM 40 MG PO TBEC
40.0000 mg | DELAYED_RELEASE_TABLET | Freq: Two times a day (BID) | ORAL | Status: DC
  Administered 2021-05-15 – 2021-05-16 (×2): 40 mg via ORAL
  Filled 2021-05-15 (×2): qty 1

## 2021-05-15 MED ORDER — SODIUM CHLORIDE 0.9 % IV SOLN: INTRAVENOUS | Status: DC

## 2021-05-15 NOTE — Progress Notes (Signed)
Blood transfusion started. Blood trnx reaction reviewed with patient/family with teach back. Will continue to monitor.

## 2021-05-15 NOTE — Progress Notes (Addendum)
Daily Rounding Note  05/15/2021, 9:21 AM  LOS: 1 day   SUBJECTIVE:   Chief complaint:  GI bleed.  Blood loss anemia    Stools still loose, dark.  Some wooziness w standing.  No SOB, Ab pain, nausea, CP. Hungry on clears.  Overall feels well   OBJECTIVE:         Vital signs in last 24 hours:    Temp:  [97.5 F (36.4 C)-99.8 F (37.7 C)] 99.8 F (37.7 C) (05/29 0351) Pulse Rate:  [63-85] 85 (05/29 0351) Resp:  [12-18] 18 (05/29 0351) BP: (118-245)/(46-77) 128/60 (05/29 0351) SpO2:  [94 %-99 %] 95 % (05/29 0351) Last BM Date: (P) 05/14/21 Filed Weights   05/14/21 0600  Weight: 78.7 kg   General: up in bedside chair.  Looks well and comfortable   Heart: RRR Chest: clear Abdomen: soft,NT, ND.  Active BS   Extremities: no CCE Neuro/Psych:  Pleasant, oriented x 3.  Calm.  Fluid speech.  No gross deficits.    Intake/Output from previous day: 05/28 0701 - 05/29 0700 In: 545 [I.V.:545] Out: 11 [Urine:1; Blood:10]  Intake/Output this shift: Total I/O In: -  Out: 600 [Urine:600]  Lab Results: Recent Labs    05/13/21 0903 05/13/21 1628 05/14/21 0535 05/14/21 1242 05/15/21 0650  WBC 10.5  --   --   --  9.1  HGB 8.0*   < > 7.8* 8.4* 7.0*  HCT 26.0*   < > 23.3* 26.1* 22.4*  PLT 235  --   --   --  145*   < > = values in this interval not displayed.   BMET Recent Labs    05/13/21 0903 05/15/21 0650  NA 140 140  K 4.4 4.5  CL 109 114*  CO2 20* 19*  GLUCOSE 191* 116*  BUN 43* 48*  CREATININE 2.22* 2.56*  CALCIUM 8.9 8.3*   LFT Recent Labs    05/13/21 0903 05/15/21 0650  PROT 6.6  --   ALBUMIN 3.4* 2.8*  AST 47*  --   ALT 72*  --   ALKPHOS 79  --   BILITOT 0.6  --    PT/INR Recent Labs    05/13/21 0958  LABPROT 14.9  INR 1.2   Hepatitis Panel No results for input(s): HEPBSAG, HCVAB, HEPAIGM, HEPBIGM in the last 72 hours.  Studies/Results: [place] EKG SITE RITE  Result Date:  05/14/2021 If Site Rite image not attached, placement could not be confirmed due to current cardiac rhythm.   ASSESMENT:   *  GI bleed 5/28 EGD: blood in stomach.  Fundic gland polyps.  Duodenal stenosis.  Non-bleeding DU, another oozing DU treated w epi, cautery.  Biopsies of stomach.   PPI drip in place.  72 hours finishes 5/31 at 0915.     *   Blood loss anemia.  PRBCs x 2 (5/27 and 5/28). Hgb 6 >> 8.4 >> 7.    *   Thrombocytopenia.  Platelets 145 K.    *   AKI.  Improved.    *  Minor elevation transaminases.   PLAN   *   Await path report.  Continue PPI drip.   Diet advance decisions per Dr Loletha Carrow.   Check Hgb 4 pm.  ? Transfuse 3rd PRBC: defer to attending.      Erica Whitney  05/15/2021, 9:21 AM Phone [phone]  I have discussed the case with the APP, and that is the  plan I formulated. I personally interviewed and examined the patient.  Erica Whitney was present at the bedside as well.  Erica Whitney looks better today, sitting up in a chair, comfortable, enjoying a clear liquid diet but hungry for more. Erica Whitney has had dark loose stool overnight.  Repeat hemoglobin check last evening was not performed, this morning it is 7.0 down from 8.4 (last value having been done prior to yesterday's endoscopic procedures).  Erica Whitney remains on a Protonix drip and no other clinical events overnight.  Erica Whitney has been hemodynamically stable.  I believe Erica Whitney bleeding has stopped, and the drop in hemoglobin is because Erica Whitney was still bleeding through much of the afternoon yesterday until endoscopic control.  Recommendations:  Discontinue Protonix drip and change to Protonix 40 mg by mouth twice daily (ordered)  Transfuse this patient 1 unit of PRBCs today  I have advanced Erica Whitney to a regular diet.  Monitor in hospital until tomorrow.  If stable then, can be discharged home for outpatient follow-up with Drs. Mann/Hung.  I will sign the patient out to them to arrange outpatient follow-up and to check up on Erica Whitney  gastric biopsy results to rule out H. Pylori.  Remain off aspirin, something to be revisited after GI follow-up.  Signing off, please call if needed.  Our practice is covering Drs. Collene Mares and [place] until the morning of 05/17/2021.   27 minutes were spent on this encounter (including chart review, history/exam, counseling/coordination of care, and documentation) > 50% of that time was spent on counseling and coordination of care.    [name] Office: [phone]

## 2021-05-15 NOTE — Progress Notes (Signed)
PROGRESS NOTE  Erica Whitney XBJ:[id number] DOB: 30-May-1941   PCP: Fleet Contras, MD  Patient is from: Home  DOA: 05/13/2021 LOS: 1  Chief complaints: Rectal bleed  Brief Narrative / Interim history: 80 year old F with PMH of DM-2, HTN, PUD, diverticulosis, iron deficiency anemia, CKD-3 and HLD presenting with 2 episodes of bright red blood per rectum on 5/26 and 5/27, and admitted for acute blood loss anemia due to rectal bleed.  Hgb 8.0 from baseline of 9.1.  Hemoccult positive.  Started on IV Protonix.  GI consulted, and patient was admitted.  Patient was on low-dose aspirin prior to presentation.   Hgb dropped further to 6.0.transfused 2 units with appropriate response.  EGD on 5/28 with normal esophagus, red blood in the entire stomach, multiple fundic gland polyps, acquired duodenal stenosis, nonbleeding duodenal ulcer as well as oozing duodenal ulcer that was injected and treated with bipolar cautery.  Multiple gastric biopsies were obtained. Hgb seems to be reaching equilibrium at 7.0.  Subjective: Seen and examined earlier this morning.  No major events overnight of this morning.  She reports dark tarry stool earlier this morning but no hematochezia.  Denies chest pain, dyspnea, dizziness, nausea, vomiting or abdominal pain.  Denies UTI symptoms.  Objective: Vitals:   05/14/21 1945 05/14/21 2332 05/15/21 0351 05/15/21 1152  BP: 130/67 (!) 118/46 128/60 132/67  Pulse: 70 74 85 64  Resp: 17 15 18 18   Temp: 98.7 F (37.1 C) 99.2 F (37.3 C) 99.8 F (37.7 C) 98.5 F (36.9 C)  TempSrc: Oral Oral Oral Oral  SpO2: 97% 96% 95% 96%  Weight:      Height:        Intake/Output Summary (Last 24 hours) at 05/15/2021 1343 Last data filed at 05/15/2021 1325 Gross per 24 hour  Intake 1267.39 ml  Output 610 ml  Net 657.39 ml   Filed Weights   05/14/21 0600  Weight: 78.7 kg    Examination:  GENERAL: No apparent distress.  Nontoxic. HEENT: MMM.  Vision and hearing grossly  intact.  NECK: Supple.  No apparent JVD.  RESP: On RA.  No IWOB.  Fair aeration bilaterally. CVS:  RRR. Heart sounds normal.  ABD/GI/GU: BS+. Abd soft, NTND.  MSK/EXT:  Moves extremities. No apparent deformity. No edema.  SKIN: no apparent skin lesion or wound NEURO: Awake and alert. Oriented appropriately.  No apparent focal neuro deficit. PSYCH: Calm. Normal affect.   Procedures:  EGD on 5/28 with normal esophagus, red blood in the entire stomach, multiple fundic gland polyps, acquired duodenal stenosis, nonbleeding duodenal ulcer as well as oozing duodenal ulcer that was injected and treated with bipolar cautery.  Multiple gastric biopsies were obtained.  Microbiology summarized: COVID-19 and influenza PCR nonreactive.  Assessment & Plan: ABLA likely due to oozing duodenal ulcer as noted on EGD Duodenal ulcer Multiple gastric polyps Acquired duodenal stenosis -EGD as above. Recent Labs    01/27/21 0950 02/03/21 1029 05/13/21 0903 05/13/21 1628 05/14/21 0535 05/14/21 1242 05/15/21 0650 05/15/21 1200  HGB 9.3* 9.1* 8.0* 6.0* 7.8* 8.4* 7.0* 7.1*  -Transfuse 1 more unit -P.o. Protonix 40 mg twice daily -Discontinue aspirin indefinitely.  I do not see the indication -Advance diet  AKI on CKD-3B: Cr slightly up.  Could be hemodynamically mediated after normalizing BP Recent Labs    01/27/21 0950 02/03/21 1029 05/13/21 0903 05/15/21 0650  BUN 42* 79* 43* 48*  CREATININE 2.54* 3.08* 2.22* 2.56*  -Hold home lisinopril and HCTZ -Recheck in the morning  Uncontrolled HTN: Normotensive for most part. -Continue home metoprolol and hydralazine -Holding home lisinopril and HCTZ  NIDDM-2?  Does not seem to be on diabetic medication.  She has mild hyperglycemia. Recent Labs  Lab 05/14/21 1129 05/14/21 1558 05/14/21 2051 05/15/21 0558 05/15/21 1104  GLUCAP 129* 125* 97 111* 137*  -Continue SSI-renal -Hemoglobin A1is not reliable at this point  Hypomagnesemia: Mg  1.6. -IV magnesium sulfate 2 g x 1  Metabolic acidosis: Likely from IV fluid. -Recheck in the morning  History of depression: Stable -Continue home Zoloft  Neuropathy -Continue home gabapentin  History of stage I breast cancer s/p lumpectomy and localized radioactive seed in 2015.  In remission  Class I obesity Body mass index is 30.73 kg/m.         DVT prophylaxis:  SCDs Start: 05/13/21 1444  Code Status: Full code Family Communication: Updated patient's daughter at bedside Level of care: Telemetry Medical  Status is: Inpatient  Remains inpatient appropriate because:Persistent severe electrolyte disturbances, IV treatments appropriate due to intensity of illness or inability to take PO and Inpatient level of care appropriate due to severity of illness   Dispo: The patient is from: Home              Anticipated d/c is to: Home              Patient currently is not medically stable to d/c.   Difficult to place patient No          Consultants:  Gastroenterology   Sch Meds:  Scheduled Meds: . sodium chloride   Intravenous Once  . atorvastatin  40 mg Oral q1800  . gabapentin  100 mg Oral BID  . hydrALAZINE  25 mg Oral TID  . insulin aspart  0-15 Units Subcutaneous TID WC  . metoprolol succinate  25 mg Oral Daily  . mirtazapine  30 mg Oral QHS  . pantoprazole  40 mg Oral BID AC  . sertraline  100 mg Oral Daily   Continuous Infusions: . sodium chloride Stopped (05/15/21 0710)   PRN Meds:.acetaminophen **OR** acetaminophen, hydrALAZINE, phenol  Antimicrobials: Anti-infectives (From admission, onward)   None       I have personally reviewed the following labs and images: CBC: Recent Labs  Lab 05/13/21 0903 05/13/21 1628 05/14/21 0535 05/14/21 1242 05/15/21 0650 05/15/21 1200  WBC 10.5  --   --   --  9.1 9.2  HGB 8.0* 6.0* 7.8* 8.4* 7.0* 7.1*  HCT 26.0* 19.7* 23.3* 26.1* 22.4* 22.7*  MCV 98.5  --   --   --  94.9 96.6  PLT 235  --   --    --  145* 152   BMP &GFR Recent Labs  Lab 05/13/21 0903 05/15/21 0650  NA 140 140  K 4.4 4.5  CL 109 114*  CO2 20* 19*  GLUCOSE 191* 116*  BUN 43* 48*  CREATININE 2.22* 2.56*  CALCIUM 8.9 8.3*  MG  --  1.6*  PHOS  --  5.4*   Estimated Creatinine Clearance: 18 mL/min (A) (by C-G formula based on SCr of 2.56 mg/dL (H)). Liver & Pancreas: Recent Labs  Lab 05/13/21 0903 05/15/21 0650  AST 47*  --   ALT 72*  --   ALKPHOS 79  --   BILITOT 0.6  --   PROT 6.6  --   ALBUMIN 3.4* 2.8*   Recent Labs  Lab 05/13/21 0958  LIPASE 30   No results for input(s): AMMONIA in the  last 168 hours. Diabetic: No results for input(s): HGBA1C in the last 72 hours. Recent Labs  Lab 05/14/21 1129 05/14/21 1558 05/14/21 2051 05/15/21 0558 05/15/21 1104  GLUCAP 129* 125* 97 111* 137*   Cardiac Enzymes: No results for input(s): CKTOTAL, CKMB, CKMBINDEX, TROPONINI in the last 168 hours. No results for input(s): PROBNP in the last 8760 hours. Coagulation Profile: Recent Labs  Lab 05/13/21 0958  INR 1.2   Thyroid Function Tests: No results for input(s): TSH, T4TOTAL, FREET4, T3FREE, THYROIDAB in the last 72 hours. Lipid Profile: No results for input(s): CHOL, HDL, LDLCALC, TRIG, CHOLHDL, LDLDIRECT in the last 72 hours. Anemia Panel: Recent Labs    05/14/21 0535  VITAMINB12 288  FOLATE 59.0  FERRITIN 22  TIBC 235*  IRON 140  RETICCTPCT 2.2   Urine analysis:    Component Value Date/Time   COLORURINE YELLOW 01/30/2012 1215   APPEARANCEUR CLEAR 01/30/2012 1215   LABSPEC 1.019 01/30/2012 1215   PHURINE 6.5 01/30/2012 1215   GLUCOSEU >1000 (A) 01/30/2012 1215   HGBUR NEGATIVE 01/30/2012 1215   BILIRUBINUR NEGATIVE 01/30/2012 1215   KETONESUR NEGATIVE 01/30/2012 1215   PROTEINUR NEGATIVE 01/30/2012 1215   UROBILINOGEN 0.2 01/30/2012 1215   NITRITE NEGATIVE 01/30/2012 1215   LEUKOCYTESUR NEGATIVE 01/30/2012 1215   Sepsis Labs: Invalid input(s): PROCALCITONIN,  LACTICIDVEN  Microbiology: Recent Results (from the past 240 hour(s))  Resp Panel by RT-PCR (Flu A&B, Covid) Nasopharyngeal Swab     Status: None   Collection Time: 05/13/21 10:02 AM   Specimen: Nasopharyngeal Swab; Nasopharyngeal(NP) swabs in vial transport medium  Result Value Ref Range Status   SARS Coronavirus 2 by RT PCR NEGATIVE NEGATIVE Final    Comment: (NOTE) SARS-CoV-2 target nucleic acids are NOT DETECTED.  The SARS-CoV-2 RNA is generally detectable in upper respiratory specimens during the acute phase of infection. The lowest concentration of SARS-CoV-2 viral copies this assay can detect is 138 copies/mL. A negative result does not preclude SARS-Cov-2 infection and should not be used as the sole basis for treatment or other patient management decisions. A negative result may occur with  improper specimen collection/handling, submission of specimen other than nasopharyngeal swab, presence of viral mutation(s) within the areas targeted by this assay, and inadequate number of viral copies(<138 copies/mL). A negative result must be combined with clinical observations, patient history, and epidemiological information. The expected result is Negative.  Fact Sheet for Patients:  BloggerCourse.com  Fact Sheet for Healthcare Providers:  SeriousBroker.it  This test is no t yet approved or cleared by the [place] FDA and  has been authorized for detection and/or diagnosis of SARS-CoV-2 by FDA under an Emergency Use Authorization (EUA). This EUA will remain  in effect (meaning this test can be used) for the duration of the COVID-19 declaration under Section 564(b)(1) of the Act, 21 U.S.C.section 360bbb-3(b)(1), unless the authorization is terminated  or revoked sooner.       Influenza A by PCR NEGATIVE NEGATIVE Final   Influenza B by PCR NEGATIVE NEGATIVE Final    Comment: (NOTE) The Xpert Xpress SARS-CoV-2/FLU/RSV plus  assay is intended as an aid in the diagnosis of influenza from Nasopharyngeal swab specimens and should not be used as a sole basis for treatment. Nasal washings and aspirates are unacceptable for Xpert Xpress SARS-CoV-2/FLU/RSV testing.  Fact Sheet for Patients: BloggerCourse.com  Fact Sheet for Healthcare Providers: SeriousBroker.it  This test is not yet approved or cleared by the [place] FDA and has been authorized for detection and/or  diagnosis of SARS-CoV-2 by FDA under an Emergency Use Authorization (EUA). This EUA will remain in effect (meaning this test can be used) for the duration of the COVID-19 declaration under Section 564(b)(1) of the Act, 21 U.S.C. section 360bbb-3(b)(1), unless the authorization is terminated or revoked.  Performed at [facility], [address], [place], [place] [address]     Radiology Studies: No results found.   Philippe Gang T. Amun Stemm [facility]  If 7PM-7AM, please contact night-coverage [email or web address] 05/15/2021, 1:43 PM

## 2021-05-15 NOTE — Evaluation (Signed)
Physical Therapy Evaluation Patient Details Name: DELLE Whitney MRN: [id number] DOB: [birth year] Today's Date: 05/15/2021   History of Present Illness  This 80 y.o. female admitted 5/27 with rectal bleeding.  She underwent EGD and colonscopy 5/28 with biopsies taken of her stomach.  Remainder of results pending and work up still underway.  PMH includes: uterine CA, peptic ulcer, insomnia, HTN, DM, depression, [place] CA, s/p back surgery    Clinical Impression  Pt presents with condition above and deficits mentioned below, see PT Problem List. PTA, she was living alone in a handicap apartment on the ground floor. She was mod I using her SPC in the community but no AD when inside her home. Currently, pt displays generalized weakness (L leg weaker than R), balance deficits, delayed bil lower extremity dynamic proprioception, and decreased activity tolerance that impacts her safety and independence with all functional mobility. Pt only needing min guard for transfers and minA x1HHA to ambulate on even surfaces. Pt likely would be able to ambulate with her SPC with min guard. Pt is at risk for falls and would benefit from continued follow-up with Outpatient PT at the clinic she was going to PTA. Will continue to follow acutely.    Follow Up Recommendations Outpatient PT;Supervision - Intermittent (was going to one [address])    Equipment Recommendations  None recommended by PT    Recommendations for Other Services       Precautions / Restrictions Precautions Precautions: Fall Precaution Comments: Pt unsteady Restrictions Weight Bearing Restrictions: No      Mobility  Bed Mobility               General bed mobility comments: pt sitting up in recliner    Transfers Overall transfer level: Needs assistance Equipment used: None Transfers: Sit to/from [facility] Sit to Stand: Min guard Stand pivot transfers: Min assist       General transfer comment:  Min guard to transfer sit <> stand at recliner, no LOB.  Ambulation/Gait Ambulation/Gait assistance: Min assist Gait Distance (Feet): 150 Feet Assistive device: 1 person hand held assist Gait Pattern/deviations: Step-through pattern;Decreased stride length;Shuffle;Decreased dorsiflexion - right;Decreased dorsiflexion - left;Trunk flexed Gait velocity: reduced Gait velocity interpretation: <1.31 ft/sec, indicative of household ambulator General Gait Details: Pt with kyphotic posture using R HHA with min pressure through hand for stability to simulate use of a SPC. Pt with no LOB, but did display decreased bil step length and feet clearance, shuffling feet at times. Pt reports she is currently ambulating at a slower pace than usual.  Stairs            Wheelchair Mobility    Modified Rankin (Stroke Patients Only)       Balance Overall balance assessment: Needs assistance Sitting-balance support: Feet supported Sitting balance-Leahy Scale: Good     Standing balance support: No upper extremity supported;During functional activity Standing balance-Leahy Scale: Fair Standing balance comment: ablet to maintain static standing with min guard assist                             Pertinent Vitals/Pain Pain Assessment: Faces Faces Pain Scale: No hurt Pain Intervention(s): Monitored during session    Home Living Family/patient expects to be discharged to:: Private residence Living Arrangements: Alone Available Help at Discharge: Family;Available PRN/intermittently Type of Home: Apartment Home Access: Level entry     Home Layout: One level Home Equipment: Walker - 2 wheels;Tub bench;Cane -  single point      Prior Function Level of Independence: Independent with assistive device(s)         Comments: Pt ambulates with SPC when outside or in the community.  She does not use AD in her apartment.  She is mod I with all ADLs and IADLs, but does not drive.  She was  going to [place]: Right    Extremity/Trunk Assessment   Upper Extremity Assessment Upper Extremity Assessment: Defer to OT evaluation    Lower Extremity Assessment Lower Extremity Assessment: Generalized weakness;LLE deficits/detail (delayed proprioception in bil legs) RLE Deficits / Details: MMT scores of 4 to 5 grossly on R RLE Sensation: decreased proprioception RLE Coordination: decreased gross motor LLE Deficits / Details: Gross weakness compared to R side, MMT scores grossly 4- to 4 compared to 4 to 5 grossly on R LLE Sensation: decreased proprioception LLE Coordination: decreased gross motor    Cervical / Trunk Assessment Cervical / Trunk Assessment: Kyphotic  Communication   Communication: Expressive difficulties (low volume - difficult to understand at times)  Cognition Arousal/Alertness: Awake/alert Behavior During Therapy: WFL for tasks assessed/performed Overall Cognitive Status: Within Functional Limits for tasks assessed                                        General Comments General comments (skin integrity, edema, etc.): daugther present during session    Exercises     Assessment/Plan    PT Assessment Patient needs continued PT services  PT Problem List Decreased strength;Decreased range of motion;Decreased balance;Decreased activity tolerance;Decreased mobility;Decreased coordination;Decreased safety awareness;Impaired sensation       PT Treatment Interventions DME instruction;Gait training;Functional mobility training;Therapeutic activities;Therapeutic exercise;Balance training;Neuromuscular re-education;Patient/family education    PT Goals (Current goals can be found in the Care Plan section)  Acute Rehab PT Goals Patient Stated Goal: to get stronger and back to normal PT Goal Formulation: With patient/family Time For Goal Achievement: 05/28/21 Potential to Achieve Goals: Good    Frequency Min  3X/week   Barriers to discharge        Co-evaluation               AM-PAC PT "6 Clicks" Mobility  Outcome Measure Help needed turning from your back to your side while in a flat bed without using bedrails?: A Little Help needed moving from lying on your back to sitting on the side of a flat bed without using bedrails?: A Little Help needed moving to and from a bed to a chair (including a wheelchair)?: A Little Help needed standing up from a chair using your arms (e.g., wheelchair or bedside chair)?: A Little Help needed to walk in hospital room?: A Little Help needed climbing 3-5 steps with a railing? : A Little 6 Click Score: 18    End of Session Equipment Utilized During Treatment: Gait belt Activity Tolerance: Patient tolerated treatment well Patient left: in chair;with call bell/phone within reach;with family/visitor present Nurse Communication: Mobility status (no chair alarm, asking if daughter can assist pt in room as needed) PT Visit Diagnosis: Unsteadiness on feet (R26.81);Other abnormalities of gait and mobility (R26.89);Muscle weakness (generalized) (M62.81);Difficulty in walking, not elsewhere classified (R26.2)    Time: 1013-1030 PT Time Calculation (min) (ACUTE ONLY): 17 min   Charges:   PT Evaluation $PT Eval Moderate Complexity: 1 Mod  Moishe Spice, PT, DPT Acute Rehabilitation Services  Pager: [phone] Office: [place] 05/15/2021, 11:41 AM

## 2021-05-15 NOTE — Evaluation (Signed)
Occupational Therapy Evaluation Patient Details Name: Erica Whitney MRN: [id number] DOB: [birth year] Today's Date: 05/15/2021    History of Present Illness This 80 y.o. female admitted with rectal bleeding.  She underwent EGD and colonscopy 5/28 with biopsies taken of her stomach.  Remainder of results pending and work up still underway.  PMH includes: uterine CA, peptic ulcer, insomnia, HTN, DM, depression, [place] CA, s/p back surgery   Clinical Impression   Pt admitted with above. She demonstrates the below listed deficits and will benefit from continued OT to maximize safety and independence with BADLs.  Pt presents to OT with generalized weakness, impaired balance, decreased activity tolerance.  She currently requires set up - min A for ADLs and min A - min guard assist for functional mobility.  She lives alone with good support from family.  She was mod I for all ADLs and IADLs PTA.   Anticipate she will be able to return home with intermittent supervision from family.  She has all needed DME from OT standpoint.  Will follow acutely.       Follow Up Recommendations  No OT follow up;Supervision - Intermittent    Equipment Recommendations  None recommended by OT    Recommendations for Other Services       Precautions / Restrictions Precautions Precautions: Fall Precaution Comments: Pt unsteady      Mobility Bed Mobility               General bed mobility comments: pt sitting up in recliner    Transfers Overall transfer level: Needs assistance Equipment used: 1 person hand held assist Transfers: Sit to/from Stand;Stand Pivot Transfers Sit to Stand: Min guard Stand pivot transfers: Min assist       General transfer comment: Pt requires min guard assist for sit to stand, and min A (HHA) initially for transfers and ambulating to BR, but progressed to close min guard assist without AD as session progressed.  Instructed pt and daughter that pt should likely use cane  while in her apartment until she has returned to baseline    Balance Overall balance assessment: Needs assistance Sitting-balance support: Feet supported Sitting balance-Leahy Scale: Good     Standing balance support: No upper extremity supported;During functional activity Standing balance-Leahy Scale: Fair Standing balance comment: ablet o maintain static standing with min guard assist                           ADL either performed or assessed with clinical judgement   ADL Overall ADL's : Needs assistance/impaired Eating/Feeding: Independent   Grooming: Wash/dry hands;Wash/dry face;Oral care;Min guard;Standing   Upper Body Bathing: Set up;Sitting Upper Body Bathing Details (indicate cue type and reason): Pt is able to perform with set up, but preferred for her daughter to assist her this date Lower Body Bathing: Min guard;Sit to/from stand   Upper Body Dressing : Set up;Sitting   Lower Body Dressing: Min guard;Sit to/from stand   Toilet Transfer: Minimal assistance;Ambulation;Comfort height toilet;Grab bars   Toileting- Clothing Manipulation and Hygiene: Min guard;Sit to/from stand       Functional mobility during ADLs: Minimal assistance       Vision Patient Visual Report: No change from baseline       Perception     Praxis      Pertinent Vitals/Pain Pain Assessment: No/denies pain     Hand Dominance Right   Extremity/Trunk Assessment Upper Extremity Assessment Upper Extremity Assessment: Generalized weakness  Lower Extremity Assessment Lower Extremity Assessment: Defer to PT evaluation   Cervical / Trunk Assessment Cervical / Trunk Assessment: Kyphotic   Communication Communication Communication: Expressive difficulties (low volume - difficult to understand at times)   Cognition Arousal/Alertness: Awake/alert Behavior During Therapy: WFL for tasks assessed/performed Overall Cognitive Status: Within Functional Limits for tasks  assessed                                     General Comments  daugther present during session    Exercises     Shoulder Instructions      Home Living Family/patient expects to be discharged to:: Private residence Living Arrangements: Alone Available Help at Discharge: Family;Available PRN/intermittently Type of Home: Apartment Home Access: Level entry     Home Layout: One level     Bathroom Shower/Tub: Tub/shower unit;Curtain   Bathroom Toilet: Handicapped height     Home Equipment: Environmental consultant - 2 wheels;Tub bench;Cane - single point          Prior Functioning/Environment Level of Independence: Independent with assistive device(s)        Comments: Pt ambulates with SPC when outside or in the community.  She does not use AD in her apartment.  She is mod I with all ADLs and IADLs, but does not drive.  She was going to OPPT        OT Problem List: Decreased strength;Impaired balance (sitting and/or standing);Decreased activity tolerance      OT Treatment/Interventions: Self-care/ADL training;Therapeutic exercise;Therapeutic activities;Patient/family education;Balance training;DME and/or AE instruction    OT Goals(Current goals can be found in the care plan section) Acute Rehab OT Goals Patient Stated Goal: to get stronger and back to normal OT Goal Formulation: With patient Time For Goal Achievement: 05/30/21 Potential to Achieve Goals: Good ADL Goals Pt Will Perform Grooming: with modified independence;standing Pt Will Perform Upper Body Bathing: with modified independence;sitting Pt Will Perform Lower Body Bathing: with modified independence;sit to/from stand Pt Will Perform Upper Body Dressing: with modified independence;sitting Pt Will Perform Lower Body Dressing: with modified independence;sit to/from stand Pt Will Transfer to Toilet: with modified independence;ambulating;regular height toilet;grab bars;bedside commode Pt Will Perform Toileting  - Clothing Manipulation and hygiene: with modified independence;sit to/from stand  OT Frequency: Min 2X/week   Barriers to D/C:            Co-evaluation              AM-PAC OT "6 Clicks" Daily Activity     Outcome Measure Help from another person eating meals?: None Help from another person taking care of personal grooming?: A Little Help from another person toileting, which includes using toliet, bedpan, or urinal?: A Little Help from another person bathing (including washing, rinsing, drying)?: A Little Help from another person to put on and taking off regular upper body clothing?: A Little Help from another person to put on and taking off regular lower body clothing?: A Little 6 Click Score: 19   End of Session Nurse Communication: Mobility status  Activity Tolerance: Patient tolerated treatment well Patient left: in chair;with call bell/phone within reach;with family/visitor present  OT Visit Diagnosis: Unsteadiness on feet (R26.81)                Time: 0822-0900 OT Time Calculation (min): 38 min Charges:  OT General Charges $OT Visit: 1 Visit OT Evaluation $OT Eval Moderate Complexity: 1 Mod OT Treatments $Self  Care/Home Management : 8-22 mins  Nilsa Nutting., OTR/L Acute Rehabilitation Services Pager [phone] Office [place], [place] 05/15/2021, 9:22 AM

## 2021-05-15 NOTE — Progress Notes (Signed)
Patient with blood transfusion order, waiting for availability of blood product. Pt/family updated of POC.

## 2021-05-16 ENCOUNTER — Encounter (HOSPITAL_COMMUNITY): Payer: Self-pay | Admitting: Gastroenterology

## 2021-05-16 DIAGNOSIS — I10 Essential (primary) hypertension: Secondary | ICD-10-CM | POA: Diagnosis not present

## 2021-05-16 DIAGNOSIS — K269 Duodenal ulcer, unspecified as acute or chronic, without hemorrhage or perforation: Secondary | ICD-10-CM

## 2021-05-16 DIAGNOSIS — E1122 Type 2 diabetes mellitus with diabetic chronic kidney disease: Secondary | ICD-10-CM | POA: Diagnosis not present

## 2021-05-16 DIAGNOSIS — K625 Hemorrhage of anus and rectum: Secondary | ICD-10-CM | POA: Diagnosis not present

## 2021-05-16 DIAGNOSIS — D696 Thrombocytopenia, unspecified: Secondary | ICD-10-CM

## 2021-05-16 LAB — CBC
HCT: 23.4 % — ABNORMAL LOW (ref 36.0–46.0)
HCT: 26.9 % — ABNORMAL LOW (ref 36.0–46.0)
Hemoglobin: 7.5 g/dL — ABNORMAL LOW (ref 12.0–15.0)
Hemoglobin: 8.6 g/dL — ABNORMAL LOW (ref 12.0–15.0)
MCH: 29.8 pg (ref 26.0–34.0)
MCH: 30.2 pg (ref 26.0–34.0)
MCHC: 32.1 g/dL (ref 30.0–36.0)
MCHC: 32 g/dL (ref 30.0–36.0)
MCV: 92.9 fL (ref 80.0–100.0)
MCV: 94.4 fL (ref 80.0–100.0)
Platelets: 121 10*3/uL — ABNORMAL LOW (ref 150–400)
Platelets: 149 10*3/uL — ABNORMAL LOW (ref 150–400)
RBC: 2.52 MIL/uL — ABNORMAL LOW (ref 3.87–5.11)
RBC: 2.85 MIL/uL — ABNORMAL LOW (ref 3.87–5.11)
RDW: 15.7 % — ABNORMAL HIGH (ref 11.5–15.5)
RDW: 15.9 % — ABNORMAL HIGH (ref 11.5–15.5)
WBC: 7.4 10*3/uL (ref 4.0–10.5)
WBC: 7.8 10*3/uL (ref 4.0–10.5)
nRBC: 0 % (ref 0.0–0.2)
nRBC: 0.3 % — ABNORMAL HIGH (ref 0.0–0.2)

## 2021-05-16 LAB — TYPE AND SCREEN
ABO/RH(D): O POS
Antibody Screen: NEGATIVE
Unit division: 0

## 2021-05-16 LAB — BPAM RBC
Blood Product Expiration Date: 202206282359
ISSUE DATE / TIME: 202205291655
Unit Type and Rh: 5100

## 2021-05-16 LAB — RENAL FUNCTION PANEL
Albumin: 2.7 g/dL — ABNORMAL LOW (ref 3.5–5.0)
Anion gap: 7 (ref 5–15)
BUN: 53 mg/dL — ABNORMAL HIGH (ref 8–23)
CO2: 20 mmol/L — ABNORMAL LOW (ref 22–32)
Calcium: 7.9 mg/dL — ABNORMAL LOW (ref 8.9–10.3)
Chloride: 108 mmol/L (ref 98–111)
Creatinine, Ser: 2.68 mg/dL — ABNORMAL HIGH (ref 0.44–1.00)
GFR, Estimated: 18 mL/min — ABNORMAL LOW (ref >60)
Glucose, Bld: 109 mg/dL — ABNORMAL HIGH (ref 70–99)
Phosphorus: 5.2 mg/dL — ABNORMAL HIGH (ref 2.5–4.6)
Potassium: 4 mmol/L (ref 3.5–5.1)
Sodium: 135 mmol/L (ref 135–145)

## 2021-05-16 LAB — GLUCOSE, CAPILLARY
Glucose-Capillary: 119 mg/dL — ABNORMAL HIGH (ref 70–99)
Glucose-Capillary: 85 mg/dL (ref 70–99)

## 2021-05-16 LAB — MAGNESIUM: Magnesium: 2 mg/dL (ref 1.7–2.4)

## 2021-05-16 MED ORDER — FUROSEMIDE 40 MG PO TABS: 40.0000 mg | ORAL_TABLET | Freq: Every day | ORAL | Status: DC | PRN

## 2021-05-16 MED ORDER — HYDRALAZINE HCL 50 MG PO TABS
50.0000 mg | ORAL_TABLET | Freq: Three times a day (TID) | ORAL | Status: DC
  Administered 2021-05-16: 50 mg via ORAL
  Filled 2021-05-16: qty 1

## 2021-05-16 MED ORDER — AMLODIPINE BESYLATE 10 MG PO TABS
10.0000 mg | ORAL_TABLET | Freq: Every day | ORAL | Status: DC
  Administered 2021-05-16: 10 mg via ORAL
  Filled 2021-05-16: qty 1

## 2021-05-16 MED ORDER — AMLODIPINE BESYLATE 10 MG PO TABS: 10.0000 mg | ORAL_TABLET | Freq: Every day | ORAL | 1 refills | Status: AC

## 2021-05-16 MED ORDER — HYDRALAZINE HCL 50 MG PO TABS: 50.0000 mg | ORAL_TABLET | Freq: Three times a day (TID) | ORAL | 1 refills | Status: DC

## 2021-05-16 MED ORDER — PANTOPRAZOLE SODIUM 40 MG PO TBEC: DELAYED_RELEASE_TABLET | ORAL | 0 refills | Status: DC

## 2021-05-16 NOTE — Progress Notes (Signed)
AVS reviewed with patient/family. All questions answered at this time. Transportation provided by daughter.

## 2021-05-16 NOTE — Plan of Care (Signed)

## 2021-05-16 NOTE — Discharge Summary (Signed)
Physician Discharge Summary  Erica Whitney:[id number] DOB: Aug 28, 1941 DOA: 05/13/2021  PCP: Nolene Ebbs, MD  Admit date: 05/13/2021 Discharge date: 05/16/2021  Admitted From: Home Disposition:  Home  Recommendations for Outpatient Follow-up:  1. Follow ups as below. 2. Please obtain CBC/BMP/Mag at follow up 3. Please follow up on the following pending results: pathology from EGD  Home Health: Outpatient therapy Equipment/Devices: None  Discharge Condition: Stable CODE STATUS: Full code   Follow-up Information    Nolene Ebbs, MD. Schedule an appointment as soon as possible for a visit in 1 week(s).   Specialty: Internal Medicine Contact information: [address] [place] [address] [phone]        [facility] Follow up.   Specialty: Rehabilitation Contact information: [address] [id number] mc [place] [address] [place] [facility] Course: 80 year old F with PMH of DM-2, HTN, PUD, diverticulosis, iron deficiency anemia, CKD-3 and HLD presenting with 2 episodes of bright red blood per rectum on 5/26 and 5/27, and admitted for acute blood loss anemia due to rectal bleed.  Hgb 8.0 from baseline of 9.1.  Hemoccult positive.  Started on IV Protonix.  GI consulted, and patient was admitted.  Patient was on low-dose aspirin prior to presentation.   Hgb dropped further to 6.0.transfused 2 units with appropriate response but dropped to 7.0 later.  She was transfused an additional 1 unit with appropriate response.  H&H remained stable after that.   EGD on 5/28 with normal esophagus, red blood in the entire stomach, multiple fundic gland polyps, acquired duodenal stenosis, nonbleeding duodenal ulcer as well as oozing duodenal ulcer that was injected and treated with bipolar cautery.  Multiple gastric biopsies were obtained.   On the day of discharge, patient felt well  and ready to go home.  She tolerated regular diet.  H&H remained stable.  See individual problem list below for more on hospital course.  Discharge Diagnoses:  ABLA likely due to oozing duodenal ulcer as noted on EGD Duodenal ulcers Multiple gastric polyps Acquired duodenal stenosis Recent Labs    01/27/21 0950 02/03/21 1029 05/13/21 0903 05/13/21 1628 05/14/21 0535 05/14/21 1242 05/15/21 0650 05/15/21 1200 05/16/21 0126 05/16/21 1151  HGB 9.3* 9.1* 8.0* 6.0* 7.8* 8.4* 7.0* 7.1* 7.5* 8.6*  -H&H stable after 3 units of pRBC and endoscopic intervention -P.o. Protonix 40 mg twice daily for 1 month followed by 40 mg daily -Discontinued aspirin indefinitely.  I do not see the indication -Recheck CBC in 1 to 2 weeks -GI to follow-up on gastric polyp biopsies  AKI on CKD-3B: Cr slightly up.  Could be hemodynamically mediated after normalizing BP Recent Labs    01/27/21 0950 02/03/21 1029 05/13/21 0903 05/15/21 0650 05/16/21 0126  BUN 42* 79* 43* 48* 53*  CREATININE 2.54* 3.08* 2.22* 2.56* 2.68*  -Discontinue lisinopril and HCTZ with hope of getting some renal function back -Hold p.o. Lasix for about 1 week -Recheck renal function in 1 to 2 weeks  Uncontrolled HTN:  Improved -Discontinued metoprolol-not indicated solely for HTN -Start amlodipine 10 mg daily -Increase hydralazine to 50 mg 3 times daily -Further adjustment as appropriate  NIDDM-2?  Does not seem to be on diabetic medication.  She has mild hyperglycemia. Recent Labs  Lab 05/15/21 1104 05/15/21 1556 05/15/21 2048 05/16/21 0618 05/16/21 1058  GLUCAP 137* 124* 101* 119* 85  -Check hemoglobin A1c in 3 months.  Unreliable after  blood transfusion  Hypomagnesemia: Resolved  Metabolic acidosis: Likely from IV fluid. -Recheck in the morning  History of depression: Stable -Continue home Zoloft  Neuropathy -Continue home gabapentin  History of stage I breast cancer s/p lumpectomy and localized  radioactive seed in 2015.  In remission   Thrombocytopenia: Improved. Recent Labs  Lab 05/13/21 0903 05/15/21 0650 05/15/21 1200 05/16/21 0126 05/16/21 1151  PLT 235 145* 152 121* 149*  -Recheck at follow-up  Morbid obesity Body mass index is 30.73 kg/m.            Discharge Exam: Vitals:   05/16/21 0557 05/16/21 1121  BP: (!) 177/65 138/72  Pulse:  65  Resp:  18  Temp:  98.6 F (37 C)  SpO2:  91%    GENERAL: No apparent distress.  Nontoxic. HEENT: MMM.  Vision and hearing grossly intact.  NECK: Supple.  No apparent JVD.  RESP: On RA.  No IWOB.  Fair aeration bilaterally. CVS:  RRR. Heart sounds normal.  ABD/GI/GU: Bowel sounds present. Soft. Non tender.  MSK/EXT:  Moves extremities. No apparent deformity. No edema.  SKIN: no apparent skin lesion or wound NEURO: Awake, alert and oriented appropriately.  No apparent focal neuro deficit. PSYCH: Calm. Normal affect.  Discharge Instructions  Discharge Instructions    Ambulatory referral to Physical Therapy   Complete by: As directed    Iontophoresis - 4 mg/ml of dexamethasone: No   T.E.N.S. Unit Evaluation and Dispense as Indicated: No   Call MD for:  difficulty breathing, headache or visual disturbances   Complete by: As directed    Call MD for:  extreme fatigue   Complete by: As directed    Call MD for:  persistant dizziness or light-headedness   Complete by: As directed    Diet - low sodium heart healthy   Complete by: As directed    Diet Carb Modified   Complete by: As directed    Discharge instructions   Complete by: As directed    It has been a pleasure taking care of you!  You were hospitalized due to rectal bleed likely from duodenal ulcer.  The bleeding ulcer was treated endoscopically and your bleeding subsided.  We stopped your aspirin.  We also recommend you avoid any over-the-counter pain medication other than plain Tylenol.  Please review your new medication list and the directions on your  medications before you take them.  Follow-up with your primary care doctor in 1 to 2 weeks or sooner if needed. Your gastroenterologist will arrange outpatient follow-up in the next 2 to 3 weeks.   Take care,   Increase activity slowly   Complete by: As directed      Allergies as of 05/16/2021   No Known Allergies     Medication List    STOP taking these medications   aspirin EC 81 MG tablet   lisinopril-hydrochlorothiazide 20-12.5 MG tablet Commonly known as: ZESTORETIC   metoprolol succinate 25 MG 24 hr tablet Commonly known as: TOPROL-XL     TAKE these medications   Accu-Chek Guide Control Liqd   Accu-Chek Guide test strip Generic drug: glucose blood   AIMSCO INSULIN SYR ULTRA THIN 31G X 5/16" 0.3 ML Misc Generic drug: Insulin Syringe-Needle U-100   amLODipine 10 MG tablet Commonly known as: NORVASC Take 1 tablet (10 mg total) by mouth daily. Start taking on: May 17, 2021   atorvastatin 40 MG tablet Commonly known as: LIPITOR Take 40 mg by mouth daily.   Calcium Carbonate-Vitamin  D 600-400 MG-UNIT tablet Take 1 tablet by mouth daily.   cetirizine 10 MG tablet Commonly known as: ZYRTEC Take 10 mg by mouth daily as needed for allergies.   cholecalciferol 25 MCG (1000 UNIT) tablet Commonly known as: VITAMIN D3 Take 1,000 Units by mouth daily.   cycloSPORINE 0.05 % ophthalmic emulsion Commonly known as: RESTASIS Place 1 drop into both eyes 2 (two) times daily.   diclofenac Sodium 1 % Gel Commonly known as: VOLTAREN Apply 4 g topically 4 (four) times daily as needed (pain).   furosemide 40 MG tablet Commonly known as: LASIX Take 1 tablet (40 mg total) by mouth daily as needed for fluid (ankle swelling). Start taking on: May 23, 2021 What changed: These instructions start on May 23, 2021. If you are unsure what to do until then, ask your doctor or other care provider.   gabapentin 100 MG capsule Commonly known as: NEURONTIN Take 100 mg by mouth 2  (two) times daily.   hydrALAZINE 50 MG tablet Commonly known as: APRESOLINE Take 1 tablet (50 mg total) by mouth 3 (three) times daily. What changed:   medication strength  how much to take   mirtazapine 30 MG tablet Commonly known as: REMERON Take 30 mg by mouth at bedtime.   pantoprazole 40 MG tablet Commonly known as: PROTONIX Take 1 tablet (40 mg total) by mouth 2 (two) times daily before a meal for 30 days, THEN 1 tablet (40 mg total) daily. Start taking on: May 16, 2021   PRENATAL PO Take 1 tablet by mouth daily.   sertraline 100 MG tablet Commonly known as: ZOLOFT Take 100 mg by mouth every morning.   triamcinolone cream 0.1 % Commonly known as: KENALOG Apply 1 application topically 2 (two) times daily as needed (rash).   vitamin C 500 MG tablet Commonly known as: ASCORBIC ACID Take 500 mg by mouth daily.       Consultations:  Gastroenterology  Procedures/Studies:  EGD on 5/28 with normal esophagus, red blood in the entire stomach, multiple fundic gland polyps, acquired duodenal stenosis, nonbleeding duodenal ulcer as well as oozing duodenal ulcer that was injected and treated with bipolar cautery.  Multiple gastric biopsies were obtained.    US RENAL  Result Date: 04/21/2021 CLINICAL DATA:  Acute kidney injury EXAM: RENAL / URINARY TRACT ULTRASOUND COMPLETE COMPARISON:  None. FINDINGS: Right Kidney: Renal measurements: 6.0 x 3.3 x 3.7 cm. = volume: 38 mL. Echogenicity within normal limits. No mass or hydronephrosis visualized. Left Kidney: Renal measurements: 9.7 x 4.5 x 3.1 cm = volume: 71 mL. Echogenicity within normal limits. No mass or hydronephrosis visualized. Anechoic cyst measuring 3.3 cm. Bladder: Appears normal for degree of bladder distention. Other: None. IMPRESSION: 1. No hydronephrosis. 2. Benign appearing LEFT renal cyst. Electronically Signed   By: Suzy Bouchard M.D.   On: 04/21/2021 10:59   [place] EKG SITE RITE  Result Date: 05/14/2021 If  Site Rite image not attached, placement could not be confirmed due to current cardiac rhythm.      The results of significant diagnostics from this hospitalization (including imaging, microbiology, ancillary and laboratory) are listed below for reference.     Microbiology: Recent Results (from the past 240 hour(s))  Resp Panel by RT-PCR (Flu A&B, Covid) Nasopharyngeal Swab     Status: None   Collection Time: 05/13/21 10:02 AM   Specimen: Nasopharyngeal Swab; Nasopharyngeal(NP) swabs in vial transport medium  Result Value Ref Range Status   SARS Coronavirus 2 by RT  PCR NEGATIVE NEGATIVE Final    Comment: (NOTE) SARS-CoV-2 target nucleic acids are NOT DETECTED.  The SARS-CoV-2 RNA is generally detectable in upper respiratory specimens during the acute phase of infection. The lowest concentration of SARS-CoV-2 viral copies this assay can detect is 138 copies/mL. A negative result does not preclude SARS-Cov-2 infection and should not be used as the sole basis for treatment or other patient management decisions. A negative result may occur with  improper specimen collection/handling, submission of specimen other than nasopharyngeal swab, presence of viral mutation(s) within the areas targeted by this assay, and inadequate number of viral copies(<138 copies/mL). A negative result must be combined with clinical observations, patient history, and epidemiological information. The expected result is Negative.  Fact Sheet for Patients:  EntrepreneurPulse.com.au  Fact Sheet for Healthcare Providers:  IncredibleEmployment.be  This test is no t yet approved or cleared by the [place] FDA and  has been authorized for detection and/or diagnosis of SARS-CoV-2 by FDA under an Emergency Use Authorization (EUA). This EUA will remain  in effect (meaning this test can be used) for the duration of the COVID-19 declaration under Section 564(b)(1) of the Act,  21 U.S.C.section 360bbb-3(b)(1), unless the authorization is terminated  or revoked sooner.       Influenza A by PCR NEGATIVE NEGATIVE Final   Influenza B by PCR NEGATIVE NEGATIVE Final    Comment: (NOTE) The Xpert Xpress SARS-CoV-2/FLU/RSV plus assay is intended as an aid in the diagnosis of influenza from Nasopharyngeal swab specimens and should not be used as a sole basis for treatment. Nasal washings and aspirates are unacceptable for Xpert Xpress SARS-CoV-2/FLU/RSV testing.  Fact Sheet for Patients: EntrepreneurPulse.com.au  Fact Sheet for Healthcare Providers: IncredibleEmployment.be  This test is not yet approved or cleared by the [place] FDA and has been authorized for detection and/or diagnosis of SARS-CoV-2 by FDA under an Emergency Use Authorization (EUA). This EUA will remain in effect (meaning this test can be used) for the duration of the COVID-19 declaration under Section 564(b)(1) of the Act, 21 U.S.C. section 360bbb-3(b)(1), unless the authorization is terminated or revoked.  Performed at [facility], [place] [address], [place], [place] [address]      Labs:  CBC: Recent Labs  Lab 05/13/21 [phone] 05/13/21 1628 05/14/21 1242 05/15/21 0650 05/15/21 1200 05/16/21 0126 05/16/21 1151  WBC 10.5  --   --  9.1 9.2 7.4 7.8  HGB 8.0*   < > 8.4* 7.0* 7.1* 7.5* 8.6*  HCT 26.0*   < > 26.1* 22.4* 22.7* 23.4* 26.9*  MCV 98.5  --   --  94.9 96.6 92.9 94.4  PLT 235  --   --  145* 152 121* 149*   < > = values in this interval not displayed.   BMP &GFR Recent Labs  Lab 05/13/21 0903 05/15/21 0650 05/16/21 0126  NA 140 140 135  K 4.4 4.5 4.0  CL 109 114* 108  CO2 20* 19* 20*  GLUCOSE 191* 116* 109*  BUN 43* 48* 53*  CREATININE 2.22* 2.56* 2.68*  CALCIUM 8.9 8.3* 7.9*  MG  --  1.6* 2.0  PHOS  --  5.4* 5.2*   Estimated Creatinine Clearance: 17.2 mL/min (A) (by C-G formula based on SCr of 2.68 mg/dL (H)). Liver  & Pancreas: Recent Labs  Lab 05/13/21 0903 05/15/21 0650 05/16/21 0126  AST 47*  --   --   ALT 72*  --   --   ALKPHOS 79  --   --  BILITOT 0.6  --   --   PROT 6.6  --   --   ALBUMIN 3.4* 2.8* 2.7*   Recent Labs  Lab 05/13/21 0958  LIPASE 30   No results for input(s): AMMONIA in the last 168 hours. Diabetic: No results for input(s): HGBA1C in the last 72 hours. Recent Labs  Lab 05/15/21 1104 05/15/21 1556 05/15/21 2048 05/16/21 0618 05/16/21 1058  GLUCAP 137* 124* 101* 119* 85   Cardiac Enzymes: No results for input(s): CKTOTAL, CKMB, CKMBINDEX, TROPONINI in the last 168 hours. No results for input(s): PROBNP in the last 8760 hours. Coagulation Profile: Recent Labs  Lab 05/13/21 0958  INR 1.2   Thyroid Function Tests: No results for input(s): TSH, T4TOTAL, FREET4, T3FREE, THYROIDAB in the last 72 hours. Lipid Profile: No results for input(s): CHOL, HDL, LDLCALC, TRIG, CHOLHDL, LDLDIRECT in the last 72 hours. Anemia Panel: Recent Labs    05/14/21 0535  VITAMINB12 288  FOLATE 59.0  FERRITIN 22  TIBC 235*  IRON 140  RETICCTPCT 2.2   Urine analysis:    Component Value Date/Time   COLORURINE YELLOW 01/30/2012 1215   APPEARANCEUR CLEAR 01/30/2012 1215   LABSPEC 1.019 01/30/2012 1215   PHURINE 6.5 01/30/2012 1215   GLUCOSEU >1000 (A) 01/30/2012 1215   HGBUR NEGATIVE 01/30/2012 1215   BILIRUBINUR NEGATIVE 01/30/2012 1215   KETONESUR NEGATIVE 01/30/2012 1215   PROTEINUR NEGATIVE 01/30/2012 1215   UROBILINOGEN 0.2 01/30/2012 1215   NITRITE NEGATIVE 01/30/2012 1215   LEUKOCYTESUR NEGATIVE 01/30/2012 1215   Sepsis Labs: Invalid input(s): PROCALCITONIN, LACTICIDVEN   Time coordinating discharge: 40 minutes  SIGNED:  Mercy Riding, MD  [facility] 05/16/2021, 1:59 PM  If 7PM-7AM, please contact night-coverage [email or web address]

## 2021-05-16 NOTE — Progress Notes (Signed)
Physical Therapy Treatment Patient Details Name: Erica Whitney MRN: [id number] DOB: 1941-12-11 Today's Date: 05/16/2021    History of Present Illness This 80 y.o. female admitted 5/27 with rectal bleeding.  She underwent EGD and colonscopy 5/28 with biopsies taken of her stomach.  Remainder of results pending and work up still underway.  PMH includes: uterine CA, peptic ulcer, insomnia, HTN, DM, depression, [place] CA, s/p back surgery    PT Comments    Pt progressing towards goals. Improved steadiness noted with use of cane this session and was able to increase ambulation distance. Pt asymptomatic throughout gait. Current recommendations appropriate. Will continue to follow acutely.     Follow Up Recommendations  Outpatient PT;Supervision - Intermittent (was going to [address])     Equipment Recommendations  None recommended by PT    Recommendations for Other Services       Precautions / Restrictions Precautions Precautions: Fall Restrictions Weight Bearing Restrictions: No    Mobility  Bed Mobility               General bed mobility comments: In bathroom upon entry    Transfers Overall transfer level: Needs assistance Equipment used: None Transfers: Sit to/from Stand Sit to Stand: Min guard         General transfer comment: Min guard for safety.  Ambulation/Gait Ambulation/Gait assistance: Min guard Gait Distance (Feet): 250 Feet Assistive device: Straight cane Gait Pattern/deviations: Step-through pattern;Decreased stride length Gait velocity: Decreased   General Gait Details: Shuffle type steps, and flexed posture. Min guard A for safety with use of cane. No overt LOB.   Stairs             Wheelchair Mobility    Modified Rankin (Stroke Patients Only)       Balance Overall balance assessment: Needs assistance Sitting-balance support: Feet supported Sitting balance-Leahy Scale: Good     Standing balance support: No upper  extremity supported;Single extremity supported Standing balance-Leahy Scale: Fair Standing balance comment: ablet to maintain static standing balance without UE support                            Cognition Arousal/Alertness: Awake/alert Behavior During Therapy: WFL for tasks assessed/performed Overall Cognitive Status: Within Functional Limits for tasks assessed                                        Exercises      General Comments        Pertinent Vitals/Pain Pain Assessment: No/denies pain    Home Living                      Prior Function            PT Goals (current goals can now be found in the care plan section) Acute Rehab PT Goals Patient Stated Goal: to get stronger and back to normal PT Goal Formulation: With patient/family Time For Goal Achievement: 05/28/21 Potential to Achieve Goals: Good Progress towards PT goals: Progressing toward goals    Frequency    Min 3X/week      PT Plan Current plan remains appropriate    Co-evaluation              AM-PAC PT "6 Clicks" Mobility   Outcome Measure  Help needed turning from your back to your side  while in a flat bed without using bedrails?: None Help needed moving from lying on your back to sitting on the side of a flat bed without using bedrails?: A Little Help needed moving to and from a bed to a chair (including a wheelchair)?: A Little Help needed standing up from a chair using your arms (e.g., wheelchair or bedside chair)?: A Little Help needed to walk in hospital room?: A Little Help needed climbing 3-5 steps with a railing? : A Little 6 Click Score: 19    End of Session Equipment Utilized During Treatment: Gait belt Activity Tolerance: Patient tolerated treatment well Patient left: in chair;with call bell/phone within reach;with family/visitor present Nurse Communication: Mobility status;Other (comment) (pt with dark stool) PT Visit Diagnosis:  Unsteadiness on feet (R26.81);Other abnormalities of gait and mobility (R26.89);Muscle weakness (generalized) (M62.81);Difficulty in walking, not elsewhere classified (R26.2)     Time: 2091-9802 PT Time Calculation (min) (ACUTE ONLY): 23 min  Charges:  $Gait Training: 8-22 mins $Therapeutic Activity: 8-22 mins                     Lou Miner, DPT  Acute Rehabilitation Services  Pager: [phone] Office: [phone]    Rudean Hitt 05/16/2021, 9:22 AM

## 2021-05-17 LAB — GLUCOSE, CAPILLARY
Glucose-Capillary: 93 mg/dL (ref 70–99)
Glucose-Capillary: 122 mg/dL — ABNORMAL HIGH (ref 70–99)

## 2021-05-17 LAB — SURGICAL PATHOLOGY

## 2021-05-25 ENCOUNTER — Inpatient Hospital Stay (HOSPITAL_COMMUNITY)
Admission: EM | Admit: 2021-05-25 | Discharge: 2021-05-30 | DRG: 378 | Disposition: A | Discharge status: Home Health | Payer: Medicare Other | Attending: Internal Medicine | Admitting: Internal Medicine

## 2021-05-25 ENCOUNTER — Encounter (HOSPITAL_COMMUNITY): Payer: Self-pay

## 2021-05-25 ENCOUNTER — Other Ambulatory Visit: Payer: Self-pay

## 2021-05-25 DIAGNOSIS — Z6830 Body mass index (BMI) 30.0-30.9, adult: Secondary | ICD-10-CM

## 2021-05-25 DIAGNOSIS — Z8542 Personal history of malignant neoplasm of other parts of uterus: Secondary | ICD-10-CM | POA: Diagnosis not present

## 2021-05-25 DIAGNOSIS — E1165 Type 2 diabetes mellitus with hyperglycemia: Secondary | ICD-10-CM | POA: Diagnosis not present

## 2021-05-25 DIAGNOSIS — N179 Acute kidney failure, unspecified: Secondary | ICD-10-CM | POA: Diagnosis present

## 2021-05-25 DIAGNOSIS — E7800 Pure hypercholesterolemia, unspecified: Secondary | ICD-10-CM | POA: Diagnosis present

## 2021-05-25 DIAGNOSIS — Z803 Family history of malignant neoplasm of breast: Secondary | ICD-10-CM | POA: Diagnosis not present

## 2021-05-25 DIAGNOSIS — Z853 Personal history of malignant neoplasm of breast: Secondary | ICD-10-CM | POA: Diagnosis not present

## 2021-05-25 DIAGNOSIS — R002 Palpitations: Secondary | ICD-10-CM | POA: Diagnosis not present

## 2021-05-25 DIAGNOSIS — K264 Chronic or unspecified duodenal ulcer with hemorrhage: Principal | ICD-10-CM

## 2021-05-25 DIAGNOSIS — Z90710 Acquired absence of both cervix and uterus: Secondary | ICD-10-CM

## 2021-05-25 DIAGNOSIS — Z801 Family history of malignant neoplasm of trachea, bronchus and lung: Secondary | ICD-10-CM

## 2021-05-25 DIAGNOSIS — K279 Peptic ulcer, site unspecified, unspecified as acute or chronic, without hemorrhage or perforation: Secondary | ICD-10-CM | POA: Diagnosis present

## 2021-05-25 DIAGNOSIS — E1122 Type 2 diabetes mellitus with diabetic chronic kidney disease: Secondary | ICD-10-CM | POA: Diagnosis present

## 2021-05-25 DIAGNOSIS — I129 Hypertensive chronic kidney disease with stage 1 through stage 4 chronic kidney disease, or unspecified chronic kidney disease: Secondary | ICD-10-CM | POA: Diagnosis present

## 2021-05-25 DIAGNOSIS — Z20822 Contact with and (suspected) exposure to covid-19: Secondary | ICD-10-CM | POA: Diagnosis not present

## 2021-05-25 DIAGNOSIS — D62 Acute posthemorrhagic anemia: Secondary | ICD-10-CM | POA: Diagnosis present

## 2021-05-25 DIAGNOSIS — M1990 Unspecified osteoarthritis, unspecified site: Secondary | ICD-10-CM | POA: Diagnosis present

## 2021-05-25 DIAGNOSIS — G4700 Insomnia, unspecified: Secondary | ICD-10-CM | POA: Diagnosis present

## 2021-05-25 DIAGNOSIS — I10 Essential (primary) hypertension: Secondary | ICD-10-CM | POA: Diagnosis present

## 2021-05-25 DIAGNOSIS — Z8249 Family history of ischemic heart disease and other diseases of the circulatory system: Secondary | ICD-10-CM | POA: Diagnosis not present

## 2021-05-25 DIAGNOSIS — D649 Anemia, unspecified: Secondary | ICD-10-CM

## 2021-05-25 DIAGNOSIS — N1832 Chronic kidney disease, stage 3b: Secondary | ICD-10-CM | POA: Diagnosis present

## 2021-05-25 DIAGNOSIS — F32A Depression, unspecified: Secondary | ICD-10-CM | POA: Diagnosis present

## 2021-05-25 DIAGNOSIS — Z800 Family history of malignant neoplasm of digestive organs: Secondary | ICD-10-CM

## 2021-05-25 DIAGNOSIS — E119 Type 2 diabetes mellitus without complications: Secondary | ICD-10-CM

## 2021-05-25 DIAGNOSIS — Z87891 Personal history of nicotine dependence: Secondary | ICD-10-CM

## 2021-05-25 DIAGNOSIS — Z79899 Other long term (current) drug therapy: Secondary | ICD-10-CM | POA: Diagnosis not present

## 2021-05-25 DIAGNOSIS — E1142 Type 2 diabetes mellitus with diabetic polyneuropathy: Secondary | ICD-10-CM | POA: Diagnosis present

## 2021-05-25 DIAGNOSIS — K625 Hemorrhage of anus and rectum: Secondary | ICD-10-CM

## 2021-05-25 LAB — URINALYSIS, ROUTINE W REFLEX MICROSCOPIC
Bilirubin Urine: NEGATIVE
Glucose, UA: NEGATIVE mg/dL
Hgb urine dipstick: NEGATIVE
Ketones, ur: NEGATIVE mg/dL
Leukocytes,Ua: NEGATIVE
Nitrite: NEGATIVE
Protein, ur: 30 mg/dL — AB
Specific Gravity, Urine: 1.005 (ref 1.005–1.030)
pH: 5 (ref 5.0–8.0)

## 2021-05-25 LAB — COMPREHENSIVE METABOLIC PANEL
ALT: 30 U/L (ref 0–44)
AST: 30 U/L (ref 15–41)
Albumin: 3.1 g/dL — ABNORMAL LOW (ref 3.5–5.0)
Alkaline Phosphatase: 57 U/L (ref 38–126)
Anion gap: 7 (ref 5–15)
BUN: 80 mg/dL — ABNORMAL HIGH (ref 8–23)
CO2: 21 mmol/L — ABNORMAL LOW (ref 22–32)
Calcium: 8.7 mg/dL — ABNORMAL LOW (ref 8.9–10.3)
Chloride: 109 mmol/L (ref 98–111)
Creatinine, Ser: 2.89 mg/dL — ABNORMAL HIGH (ref 0.44–1.00)
GFR, Estimated: 16 mL/min — ABNORMAL LOW (ref >60)
Glucose, Bld: 262 mg/dL — ABNORMAL HIGH (ref 70–99)
Potassium: 4.3 mmol/L (ref 3.5–5.1)
Sodium: 137 mmol/L (ref 135–145)
Total Bilirubin: 0.4 mg/dL (ref 0.3–1.2)
Total Protein: 5.8 g/dL — ABNORMAL LOW (ref 6.5–8.1)

## 2021-05-25 LAB — CBC WITH DIFFERENTIAL/PLATELET
Abs Immature Granulocytes: 0.05 10*3/uL (ref 0.00–0.07)
Basophils Absolute: 0 10*3/uL (ref 0.0–0.1)
Basophils Relative: 0 %
Eosinophils Absolute: 0 10*3/uL (ref 0.0–0.5)
Eosinophils Relative: 0 %
HCT: 14 % — ABNORMAL LOW (ref 36.0–46.0)
Hemoglobin: 4.2 g/dL — CL (ref 12.0–15.0)
Immature Granulocytes: 1 %
Lymphocytes Relative: 14 %
Lymphs Abs: 0.9 10*3/uL (ref 0.7–4.0)
MCH: 30.2 pg (ref 26.0–34.0)
MCHC: 30 g/dL (ref 30.0–36.0)
MCV: 100.7 fL — ABNORMAL HIGH (ref 80.0–100.0)
Monocytes Absolute: 0.7 10*3/uL (ref 0.1–1.0)
Monocytes Relative: 10 %
Neutro Abs: 5.1 10*3/uL (ref 1.7–7.7)
Neutrophils Relative %: 75 %
Platelets: 217 10*3/uL (ref 150–400)
RBC: 1.39 MIL/uL — ABNORMAL LOW (ref 3.87–5.11)
RDW: 16.4 % — ABNORMAL HIGH (ref 11.5–15.5)
WBC: 6.8 10*3/uL (ref 4.0–10.5)
nRBC: 1.5 % — ABNORMAL HIGH (ref 0.0–0.2)

## 2021-05-25 LAB — CBG MONITORING, ED
Glucose-Capillary: 243 mg/dL — ABNORMAL HIGH (ref 70–99)
Glucose-Capillary: 126 mg/dL — ABNORMAL HIGH (ref 70–99)

## 2021-05-25 LAB — POC OCCULT BLOOD, ED: Fecal Occult Bld: NEGATIVE

## 2021-05-25 LAB — PREPARE RBC (CROSSMATCH)

## 2021-05-25 LAB — SARS CORONAVIRUS 2 (TAT 6-24 HRS): SARS Coronavirus 2: NEGATIVE

## 2021-05-25 LAB — GLUCOSE, CAPILLARY: Glucose-Capillary: 131 mg/dL — ABNORMAL HIGH (ref 70–99)

## 2021-05-25 MED ORDER — SODIUM CHLORIDE 0.9 % IV SOLN: 250.0000 mL | INTRAVENOUS | Status: DC | PRN

## 2021-05-25 MED ORDER — SODIUM CHLORIDE 0.9% FLUSH
3.0000 mL | Freq: Two times a day (BID) | INTRAVENOUS | Status: DC
  Administered 2021-05-25 – 2021-05-30 (×10): 3 mL via INTRAVENOUS

## 2021-05-25 MED ORDER — SODIUM CHLORIDE 0.9 % IV SOLN: 10.0000 mL/h | Freq: Once | INTRAVENOUS | Status: DC

## 2021-05-25 MED ORDER — INSULIN ASPART 100 UNIT/ML IJ SOLN: 5.0000 [IU] | Freq: Once | INTRAMUSCULAR | Status: DC

## 2021-05-25 MED ORDER — INSULIN ASPART 100 UNIT/ML IJ SOLN
0.0000 [IU] | Freq: Three times a day (TID) | INTRAMUSCULAR | Status: DC
  Administered 2021-05-26: 1 [IU] via SUBCUTANEOUS

## 2021-05-25 MED ORDER — ACETAMINOPHEN 650 MG RE SUPP: 650.0000 mg | Freq: Four times a day (QID) | RECTAL | Status: DC | PRN

## 2021-05-25 MED ORDER — SODIUM CHLORIDE 0.9% FLUSH: 3.0000 mL | INTRAVENOUS | Status: DC | PRN

## 2021-05-25 MED ORDER — SODIUM CHLORIDE 0.9 % IV BOLUS: 1000.0000 mL | Freq: Once | INTRAVENOUS | Status: DC

## 2021-05-25 MED ORDER — ACETAMINOPHEN 325 MG PO TABS
650.0000 mg | ORAL_TABLET | Freq: Four times a day (QID) | ORAL | Status: DC | PRN
  Administered 2021-05-29: 650 mg via ORAL
  Filled 2021-05-25: qty 2

## 2021-05-25 MED ORDER — METOPROLOL TARTRATE 5 MG/5ML IV SOLN: 5.0000 mg | Freq: Four times a day (QID) | INTRAVENOUS | Status: DC | PRN

## 2021-05-25 MED ORDER — PANTOPRAZOLE SODIUM 40 MG IV SOLR
40.0000 mg | INTRAVENOUS | Status: DC
  Administered 2021-05-25: 40 mg via INTRAVENOUS
  Filled 2021-05-25: qty 40

## 2021-05-25 NOTE — ED Provider Notes (Signed)
[facility] EMERGENCY DEPARTMENT Provider Note   CSN: [id number] Arrival date & time: 05/25/21  1240     History No chief complaint on file.   Erica Whitney is a 80 y.o. female with PMHx HTN, hypercholesterolemia, diabetes who presents to the ED today with complaint of generalized weakness and fatigue since being discharged from the hospital on 05/30 for acute blood loss anemia with hgb 6.0 from bleeding duodenal ulcer on EGD. Pt received a total of 3 units PRBCs and was placed on protonix 40 mg BID for 1 month followed by 40 mg daily. Daughter reports that pt's stools have been light brown in color since being home and thought that she was doing well from a blood loss standpoint. She was mostly concerned due to elevated blood sugar readings at home. Daughter reports that she was not discharged with any diabetic medications and she assumed that's why her mom felt so fatigued. She was on Metformin however was taken off by her PCP a couple of months ago due to worsening kidney function per daughter. Pt denies increased thirst or urination. No chest pain. Pt does have some mild dyspnea on exertion. No other complaints at this time.   The history is provided by the patient, medical records and a relative.       Past Medical History:  Diagnosis Date  . Arthritis   . Breast cancer ([place])   . Breast cancer of upper-outer quadrant of right female breast ([place]) 11/06/2014  . Depression   . Diabetes mellitus   . Hypercholesteremia   . Hypertension   . Insomnia   . Peptic ulcer   . Uterine cancer ([place])    dx in her 9s  . Wears glasses     Patient Active Problem List   Diagnosis Date Noted  . Rectal bleeding 05/14/2021  . Bright red blood per rectum 05/13/2021  . HTN (hypertension) 05/13/2021  . PUD (peptic ulcer disease) 05/13/2021  . DM2 (diabetes mellitus, type 2) ([place]) 05/13/2021  . Osteopenia 01/13/2015  . Uterine cancer ([place])   . Breast cancer of upper-outer  quadrant of right female breast ([place]) 11/06/2014    Past Surgical History:  Procedure Laterality Date  . ABDOMINAL HYSTERECTOMY  1995  . BACK SURGERY  2000   lumb lam  . BIOPSY  05/14/2021   Procedure: BIOPSY;  Surgeon: Doran Stabler, MD;  Location: [facility] ENDOSCOPY;  Service: Gastroenterology;;  . BREAST LUMPECTOMY Right 2015  . COLONOSCOPY    . ESOPHAGOGASTRODUODENOSCOPY N/A 05/14/2021   Procedure: ESOPHAGOGASTRODUODENOSCOPY (EGD);  Surgeon: Doran Stabler, MD;  Location: [place];  Service: Gastroenterology;  Laterality: N/A;  . HEMOSTASIS CONTROL  05/14/2021   Procedure: HEMOSTASIS CONTROL;  Surgeon: Doran Stabler, MD;  Location: [place];  Service: Gastroenterology;;  . HOT HEMOSTASIS N/A 05/14/2021   Procedure: HOT HEMOSTASIS (ARGON PLASMA COAGULATION/BICAP);  Surgeon: Doran Stabler, MD;  Location: [place];  Service: Gastroenterology;  Laterality: N/A;  . ORIF METACARPAL FRACTURE  2012   left  . RADIOACTIVE SEED GUIDED PARTIAL MASTECTOMY WITH AXILLARY SENTINEL LYMPH NODE BIOPSY Right 12/17/2014   Procedure: RIGHT BREAST RADIOACTIVE SEED LOCALIZED LUMPECTOMY AND SENTINEL NODE MAPPING;  Surgeon: Autumn Messing III, MD;  Location: [place];  Service: General;  Laterality: Right;     OB History   No obstetric history on file.     Family History  Problem Relation Age of Onset  . Cancer Mother   .  Stomach cancer Mother 33  . Heart attack Father   . Cancer Brother   . Lung cancer Brother 11  . Breast cancer Sister 95    Social History   Tobacco Use  . Smoking status: Former Smoker    Packs/day: 0.25    Quit date: 11/12/2011    Years since quitting: 9.5  . Smokeless tobacco: Former Systems developer    Types: Snuff  Substance Use Topics  . Alcohol use: No  . Drug use: No    Home Medications Prior to Admission medications   Medication Sig Start Date End Date Taking? Authorizing Provider  ACCU-CHEK GUIDE test strip  03/12/17   [provider]  AIMSCO INSULIN SYR ULTRA THIN 31G X 5/16" 0.3 ML MISC  03/12/17   [provider]  amLODipine (NORVASC) 10 MG tablet Take 1 tablet (10 mg total) by mouth daily. 05/17/21   Mercy Riding, MD  atorvastatin (LIPITOR) 40 MG tablet Take 40 mg by mouth daily.    [provider]  Blood Glucose Calibration (ACCU-CHEK GUIDE CONTROL) LIQD  03/12/17   [provider]  Calcium Carbonate-Vitamin D 600-400 MG-UNIT tablet Take 1 tablet by mouth daily.    [provider]  cetirizine (ZYRTEC) 10 MG tablet Take 10 mg by mouth daily as needed for allergies. 04/14/21   [provider]  cholecalciferol (VITAMIN D3) 25 MCG (1000 UNIT) tablet Take 1,000 Units by mouth daily.    [provider]  cycloSPORINE (RESTASIS) 0.05 % ophthalmic emulsion Place 1 drop into both eyes 2 (two) times daily.    [provider]  diclofenac Sodium (VOLTAREN) 1 % GEL Apply 4 g topically 4 (four) times daily as needed (pain). 04/23/21   [provider]  furosemide (LASIX) 40 MG tablet Take 1 tablet (40 mg total) by mouth daily as needed for fluid (ankle swelling). 05/23/21   Mercy Riding, MD  gabapentin (NEURONTIN) 100 MG capsule Take 100 mg by mouth 2 (two) times daily. 03/23/21   [provider]  hydrALAZINE (APRESOLINE) 50 MG tablet Take 1 tablet (50 mg total) by mouth 3 (three) times daily. 05/16/21   Mercy Riding, MD  mirtazapine (REMERON) 30 MG tablet Take 30 mg by mouth at bedtime. 04/07/21   [provider]  pantoprazole (PROTONIX) 40 MG tablet Take 1 tablet (40 mg total) by mouth 2 (two) times daily before a meal for 30 days, THEN 1 tablet (40 mg total) daily. 05/16/21 08/14/21  Mercy Riding, MD  Prenatal Vit-Fe Fumarate-FA (PRENATAL PO) Take 1 tablet by mouth daily.    [provider]  sertraline (ZOLOFT) 100 MG tablet Take 100 mg by mouth every morning.    [provider]  triamcinolone cream (KENALOG) 0.1 % Apply 1  application topically 2 (two) times daily as needed (rash). 12/21/20   [provider]  vitamin C (ASCORBIC ACID) 500 MG tablet Take 500 mg by mouth daily.    [provider]    Allergies    Patient has no known allergies.  Review of Systems   Review of Systems  Constitutional: Positive for fatigue. Negative for chills and fever.  Respiratory: Positive for shortness of breath (on exertion).   Cardiovascular: Negative for chest pain.  Gastrointestinal: Negative for abdominal pain and blood in stool.  Neurological: Positive for weakness (generalized).  All other systems reviewed and are negative.   Physical Exam Updated Vital Signs BP (!) 162/62 (BP Location: Left Arm)  Pulse (!) 108   Temp 98.6 F (37 C) (Oral)   Resp 16   SpO2 100%   Physical Exam Vitals and nursing note reviewed.  Constitutional:      Appearance: She is not ill-appearing or diaphoretic.  HENT:     Head: Normocephalic and atraumatic.  Eyes:     Conjunctiva/sclera: Conjunctivae normal.  Cardiovascular:     Rate and Rhythm: Normal rate and regular rhythm.     Pulses: Normal pulses.  Pulmonary:     Effort: Pulmonary effort is normal.     Breath sounds: Normal breath sounds. No wheezing, rhonchi or rales.  Abdominal:     Palpations: Abdomen is soft.     Tenderness: There is no abdominal tenderness. There is no guarding or rebound.  Genitourinary:    Comments: Chaperone present for exam. No melena.  Musculoskeletal:     Cervical back: Neck supple.  Skin:    General: Skin is warm and dry.     Coloration: Skin is pale.  Neurological:     Mental Status: She is alert.     ED Results / Procedures / Treatments   Labs (all labs ordered are listed, but only abnormal results are displayed) Labs Reviewed  CBC WITH DIFFERENTIAL/PLATELET - Abnormal; Notable for the following components:      Result Value   RBC 1.39 (*)    Hemoglobin 4.2 (*)    HCT 14.0 (*)    MCV 100.7 (*)    RDW 16.4  (*)    nRBC 1.5 (*)    All other components within normal limits  COMPREHENSIVE METABOLIC PANEL - Abnormal; Notable for the following components:   CO2 21 (*)    Glucose, Bld 262 (*)    BUN 80 (*)    Creatinine, Ser 2.89 (*)    Calcium 8.7 (*)    Total Protein 5.8 (*)    Albumin 3.1 (*)    GFR, Estimated 16 (*)    All other components within normal limits  CBG MONITORING, ED - Abnormal; Notable for the following components:   Glucose-Capillary 243 (*)    All other components within normal limits  SARS CORONAVIRUS 2 (TAT 6-24 HRS)  URINALYSIS, ROUTINE W REFLEX MICROSCOPIC  IRON AND TIBC  FERRITIN  POC OCCULT BLOOD, ED  TYPE AND SCREEN  PREPARE RBC (CROSSMATCH)    EKG EKG Interpretation  Date/Time:  Wednesday May 25 2021 17:51:34 EDT Ventricular Rate:  87 PR Interval:  160 QRS Duration: 96 QT Interval:  396 QTC Calculation: 477 R Axis:   47 Text Interpretation: Sinus rhythm Probable LVH with secondary repol abnrm similar to earlier in the day Confirmed by Sherwood Gambler [phone]) on 05/25/2021 6:15:57 PM   Radiology No results found.  Procedures .Critical Care Performed by: Eustaquio Maize, PA-C Authorized by: Eustaquio Maize, PA-C   Critical care provider statement:    Critical care time (minutes):  45   Critical care was time spent personally by me on the following activities:  Discussions with consultants, evaluation of patient's response to treatment, examination of patient, ordering and performing treatments and interventions, ordering and review of laboratory studies, ordering and review of radiographic studies, pulse oximetry, re-evaluation of patient's condition, obtaining history from patient or surrogate and review of old charts     Medications Ordered in ED Medications  0.9 %  sodium chloride infusion (has no administration in time range)  sodium chloride 0.9 % bolus 1,000 mL (has no administration in time range)  insulin  aspart (novoLOG) injection 5  Units (has no administration in time range)    ED Course  I have reviewed the triage vital signs and the nursing notes.  Pertinent labs & imaging results that were available during my care of the patient were reviewed by me and considered in my medical decision making (see chart for details).    MDM Rules/Calculators/A&P                          80 year old female who presents to the ED today with complaint of generalized weakness/fatigue since being discharged from the hospital 1 week ago related to acute blood loss anemia secondary to duodenal ulcer.  On arrival to the ED today patient afebrile, nontachypneic, satting 100% on room air.  Mildly tachycardic at 108.  She was medically screened in triage and work-up began, hemoglobin has returned critical at 4.2.  Does appear she had 3 units of packed red blood cells during her last admission.  Daughter reports that her stools have been light brown in color and assumed that she had been doing well from that standpoint and was mostly concerned regarding her elevated blood sugars, not currently on diabetic medications.  CMP here with a glucose of 262, bicarb 21.  No gap.  Creatinine stable at 2.89 however BUN elevated at 80. Will plan for fluids and insulin. Pt will need to be admitted for anemia. Will plan for rectal exam as well. Daughter and pt in agreement with plan at this time.    Rectal exam without obviously melanotic stool. Will call for admission at this time.   Discussed case with [facility] Dr. Rogers Blocker who agrees to accept patient for admission.   This note was prepared using Dragon voice recognition software and may include unintentional dictation errors due to the inherent limitations of voice recognition software.   Final Clinical Impression(s) / ED Diagnoses Final diagnoses:  Symptomatic anemia  Type 2 diabetes mellitus with hyperglycemia, without long-term current use of insulin [facility])    Rx / DC Orders ED Discharge  Orders    None       Eustaquio Maize, PA-C 05/25/21 1833    Sherwood Gambler, MD 05/25/21 [phone]

## 2021-05-25 NOTE — ED Triage Notes (Signed)
Patient here with daughter who reports that she was just discharged from hospital following bleeding ulcer. Patient is a diabetic and was not sent home on any diabetic meds. Patient now has blood sugar in the 200-300s and states malaise with decreased intake. Alert and denies pain

## 2021-05-25 NOTE — ED Provider Notes (Signed)
Emergency Medicine Provider Triage Evaluation Note  Erica Whitney , a 80 y.o. female  was evaluated in triage.  Pt complains of increased fatigue, decreased po intake, and dizziness/lightheadedness for the last few days  BS 358 and BP elevated at home so daughter was concerned.  Review of Systems  Positive: Fatigue, decreased po intake, dizziness, lightheadedness Negative: Fever, cough  Physical Exam  BP (!) 162/62 (BP Location: Left Arm)   Pulse (!) 108   Temp 98.6 F (37 C) (Oral)   Resp 16   SpO2 100%  Gen:   Awake, no distress   Resp:  Normal effort  MSK:   Moves extremities without difficulty  Other:  Mildly tachycardic, lungs ctab  Medical Decision Making  Medically screening exam initiated at 1:53 PM.  Appropriate orders placed.  [name] was informed that the remainder of the evaluation will be completed by another provider, this initial triage assessment does not replace that evaluation, and the importance of remaining in the ED until their evaluation is complete.     Rodney Booze, PA-C 05/25/21 1356    Lajean Saver, MD 05/27/21 [phone]

## 2021-05-25 NOTE — ED Notes (Signed)
Attempted to call report, advised to call back in 10 min.

## 2021-05-25 NOTE — ED Notes (Signed)
Critical lab result- Hgb 4.2- Dr. Ashok Cordia notified.  Pt being moved to treatment room.

## 2021-05-25 NOTE — H&P (Signed)
History and Physical    Erica Whitney ERX:[id number] DOB: [birth year] DOA: 05/25/2021  PCP: Nolene Ebbs, MD Consultants:  Oncology: Dr. Burr Medico, nephrology: Royce Macadamia Patient coming from: Home - lives alone   Chief Complaint: elevated blood sugar, fatigue and "not herself" she also states she was just so sleepy.   HPI: Erica Whitney is a 80 y.o. female with medical history significant of breast cancer in remission, uterine cancer x 30 years ago, HTN, PUD, DM type 2, depression, CKD-3B, insomnia and recent hospitalization 05/13/2021 for oozing duodenal ulcer on EGD and acute blood loss anemia.hgb dropped to 6.0 and she aws transfused 3 units of blood. She came to ER because her daughter was actually worried about elevated sugars. Her metformin was stopped about 2 weeks ago due to renal function. Her daughter also thought she was more sleepy and more atigued than usual.  She denies any stomach pain, vomiting, diarrhea or dark stools.  She does have some nausea. She has been eating and drinking fine at home until today. She had a sandwhich this am and hasn't been able to eat much since this time. She denies any chest pain, but does endorse palpitations at times.  She does have increased shortness of breath with exertion. No cough, no orthopnea. Denies any leg swelling. She is very fatigued and just wants to sleep. Last colonoscopy was in 2015 and no family hx of colon cancer.   ED Course: H&H found to have hemoglobin of 4.2 and hematocrit of 14.  bolused 1L in ER and RBC transfusion started. Transfused 2 units. No pertinent imaging. Creatinine up from baseline to 2.89 from 2.2-2.6 poc occult blood: negative  Review of Systems: As per HPI; otherwise review of systems reviewed and negative.   Ambulatory Status: uses walker and cane at home.  No recent falls.   Past Medical History:  Diagnosis Date  . Arthritis   . Breast cancer ([place])   . Breast cancer of upper-outer quadrant of right female  breast ([place]) 11/06/2014  . Depression   . Diabetes mellitus   . Hypercholesteremia   . Hypertension   . Insomnia   . Peptic ulcer   . Uterine cancer ([place])    dx in her 62s  . Wears glasses     Past Surgical History:  Procedure Laterality Date  . ABDOMINAL HYSTERECTOMY  1995  . BACK SURGERY  2000   lumb lam  . BIOPSY  05/14/2021   Procedure: BIOPSY;  Surgeon: Doran Stabler, MD;  Location: [facility] ENDOSCOPY;  Service: Gastroenterology;;  . BREAST LUMPECTOMY Right 2015  . COLONOSCOPY    . ESOPHAGOGASTRODUODENOSCOPY N/A 05/14/2021   Procedure: ESOPHAGOGASTRODUODENOSCOPY (EGD);  Surgeon: Doran Stabler, MD;  Location: [place];  Service: Gastroenterology;  Laterality: N/A;  . HEMOSTASIS CONTROL  05/14/2021   Procedure: HEMOSTASIS CONTROL;  Surgeon: Doran Stabler, MD;  Location: [place];  Service: Gastroenterology;;  . HOT HEMOSTASIS N/A 05/14/2021   Procedure: HOT HEMOSTASIS (ARGON PLASMA COAGULATION/BICAP);  Surgeon: Doran Stabler, MD;  Location: [place];  Service: Gastroenterology;  Laterality: N/A;  . ORIF METACARPAL FRACTURE  2012   left  . RADIOACTIVE SEED GUIDED PARTIAL MASTECTOMY WITH AXILLARY SENTINEL LYMPH NODE BIOPSY Right 12/17/2014   Procedure: RIGHT BREAST RADIOACTIVE SEED LOCALIZED LUMPECTOMY AND SENTINEL NODE MAPPING;  Surgeon: Autumn Messing III, MD;  Location: [place];  Service: General;  Laterality: Right;    Social History   Socioeconomic History  . Marital  status: Single    Spouse name: Not on file  . Number of children: 4  . Years of education: Not on file  . Highest education level: Not on file  Occupational History  . Not on file  Tobacco Use  . Smoking status: Former Smoker    Packs/day: 0.25    Quit date: 11/12/2011    Years since quitting: 9.5  . Smokeless tobacco: Former Systems developer    Types: Snuff  Substance and Sexual Activity  . Alcohol use: No  . Drug use: No  . Sexual activity: Not on file  Other Topics  Concern  . Not on file  Social History Narrative  . Not on file   Social Determinants of Health   Financial Resource Strain: Not on file  Food Insecurity: Not on file  Transportation Needs: Not on file  Physical Activity: Not on file  Stress: Not on file  Social Connections: Not on file  Intimate Partner Violence: Not on file    No Known Allergies  Family History  Problem Relation Age of Onset  . Cancer Mother   . Stomach cancer Mother 20  . Heart attack Father   . Cancer Brother   . Lung cancer Brother 63  . Breast cancer Sister 22    Prior to Admission medications   Medication Sig Start Date End Date Taking? Authorizing Provider  ACCU-CHEK GUIDE test strip  03/12/17   [provider]  AIMSCO INSULIN SYR ULTRA THIN 31G X 5/16" 0.3 ML MISC  03/12/17   [provider]  amLODipine (NORVASC) 10 MG tablet Take 1 tablet (10 mg total) by mouth daily. 05/17/21   Mercy Riding, MD  atorvastatin (LIPITOR) 40 MG tablet Take 40 mg by mouth daily.    [provider]  Blood Glucose Calibration (ACCU-CHEK GUIDE CONTROL) LIQD  03/12/17   [provider]  Calcium Carbonate-Vitamin D 600-400 MG-UNIT tablet Take 1 tablet by mouth daily.    [provider]  cetirizine (ZYRTEC) 10 MG tablet Take 10 mg by mouth daily as needed for allergies. 04/14/21   [provider]  cholecalciferol (VITAMIN D3) 25 MCG (1000 UNIT) tablet Take 1,000 Units by mouth daily.    [provider]  cycloSPORINE (RESTASIS) 0.05 % ophthalmic emulsion Place 1 drop into both eyes 2 (two) times daily.    [provider]  diclofenac Sodium (VOLTAREN) 1 % GEL Apply 4 g topically 4 (four) times daily as needed (pain). 04/23/21   [provider]  furosemide (LASIX) 40 MG tablet Take 1 tablet (40 mg total) by mouth daily as needed for fluid (ankle swelling). 05/23/21   Mercy Riding, MD  gabapentin (NEURONTIN) 100 MG capsule Take 100 mg by mouth 2 (two) times  daily. 03/23/21   [provider]  hydrALAZINE (APRESOLINE) 50 MG tablet Take 1 tablet (50 mg total) by mouth 3 (three) times daily. 05/16/21   Mercy Riding, MD  mirtazapine (REMERON) 30 MG tablet Take 30 mg by mouth at bedtime. 04/07/21   [provider]  pantoprazole (PROTONIX) 40 MG tablet Take 1 tablet (40 mg total) by mouth 2 (two) times daily before a meal for 30 days, THEN 1 tablet (40 mg total) daily. 05/16/21 08/14/21  Mercy Riding, MD  Prenatal Vit-Fe Fumarate-FA (PRENATAL PO) Take 1 tablet by mouth daily.    [provider]  sertraline (ZOLOFT) 100 MG tablet Take 100 mg by mouth every morning.    [provider]  triamcinolone cream (KENALOG) 0.1 % Apply 1 application topically 2 (two) times daily as needed (rash). 12/21/20   [provider]  vitamin C (ASCORBIC ACID) 500 MG tablet Take 500 mg by mouth daily.    [provider]    Physical Exam: Vitals:   05/25/21 2000 05/25/21 2015 05/25/21 2030 05/25/21 2041  BP: (!) 170/84 (!) 183/85 (!) 172/80 (!) 178/99  Pulse: 86 86 87 86  Resp: (!) 27 17 14 14   Temp:    98.5 F (36.9 C)  TempSrc:    Oral  SpO2: 100% 99% 97% 98%  Weight:      Height:         . General:  Appears calm and comfortable and is in NAD. Pale and hard of hearing.  . Eyes:  PERRL, EOMI, normal lids, iris. Scleral pallor.  . ENT:  Hard of hearing. Grossly normal lips & tongue, mmm; poor dentition, no upper teeth, does not wear dentures.  . Neck:  no LAD, masses or thyromegaly; no carotid bruits . Cardiovascular:  RRR, no m/r/g. No LE edema.  Marland Kitchen Respiratory:   CTA bilaterally with no wheezes/rales/rhonchi.  Normal respiratory effort. . Abdomen:  soft, NT, ND, NABS . Back:   normal alignment, no CVAT . Skin:  no rash or induration seen on limited exam . Musculoskeletal:  grossly normal tone BUE/BLE, good ROM, no bony abnormality . Lower extremity:  No LE edema.  Limited foot exam with no ulcerations.  2+ distal  pulses. Marland Kitchen Psychiatric:  grossly normal mood and affect, speech fluent and appropriate, AOx3 . Neurologic:  CN 2-12 grossly intact, moves all extremities in coordinated fashion, sensation intact    Radiological Exams on Admission: Independently reviewed - see discussion in A/P where applicable  No results found.  EKG: Independently reviewed.  NSR with rate 87; nonspecific ST changes with no evidence of acute ischemia   Labs on Admission: I have personally reviewed the available labs and imaging studies at the time of the admission.  Pertinent labs: negative hemoccult  H&h: 4.2/14 Creatinine: 2.89 and BUN: 80. Baseline of 2.2-2.6   Assessment/Plan   1) Acute blood loss anemia -known duodenal ulcer. hemoccult negative.  -NPO and hold anticoagulation -transfuse 2 units and recheck H&H. Trend q 4 hours.  -GI consulted and will see in AM.    2)  PUD (peptic ulcer disease) -IV protonix and NPO -GI consulted and will see in AM   3) HTN (hypertension) -changed to norvasc 10mg  and hydralazine 50mg  TID at recent hospitalization. markedly elevated here today.  -NPO, will put her on IV parameters with labetalol until we can start PO home meds.      4) DM2 (diabetes mellitus, type 2) (HCC)  -hx of diabetes with recent metformin discharge due to worsening renal function. Sugars were well controlled on last admission.  -a1c not accurate in setting of transfusions, repeat in 3 months -SSI  5. Acute on chronic kidney disease -bolused 1L in ER -nephrotoxic drugs held at last discharge. Due to see nephrology soon.  -will monitor after transfusion/fluids.  -mag/phosphorous pending   6. Palpitations Place on telemetry. ekg with nsr.   7. Depression On zoloft. Continue when tolerating PO  8. Hx of breast and uterine cancer Followed outpatient by oncology. In remission.   Body mass index is 30.11 kg/m.   Level of care: Telemetry Medical DVT prophylaxis: SCDs.  Code Status:   Full - confirmed with patient/family Family Communication: daughter at bedside: [place]  Sibilia.  Disposition Plan:  The patient is from: home  Anticipated d/c date will depend on clinical response to treatment.   Consults called: GI, Dr. Rush Landmark.  Admission status:  Inpatient    Orma Flaming MD [facility]   How to contact the [facility] Attending or Consulting provider [place] or covering provider during after hours [place], for this patient?  1. Check the care team in [facility] and look for a) attending/consulting TR[facility] provider listed and b) the [facility] team listed 2. Log into [email or web address] and use [place]'s universal password to access. If you do not have the password, please contact the hospital operator. 3. Locate the [facility] provider you are looking for under [facility] and page to a number that you can be directly reached. 4. If you still have difficulty reaching the provider, please page the [facility] (Director on Call) for the Hospitalists listed on amion for assistance.   05/25/2021, 9:01 PM

## 2021-05-25 NOTE — Progress Notes (Signed)
Patient arrived to 2w bed 08 from ED. Report taken from nurse stated blood transfusion was complete at 2038. Currently patient has no complaints of pain or discomfort. Will continue to monitor and assess and proceed with orders as directed. Physician will be notified for any changes.

## 2021-05-26 ENCOUNTER — Encounter (HOSPITAL_COMMUNITY)
Admission: EM | Disposition: A | Discharge status: Home Health | Payer: Self-pay | Source: Home / Self Care | Attending: Internal Medicine

## 2021-05-26 ENCOUNTER — Inpatient Hospital Stay (HOSPITAL_COMMUNITY): Payer: Medicare Other | Admitting: Anesthesiology

## 2021-05-26 ENCOUNTER — Encounter (HOSPITAL_COMMUNITY): Payer: Self-pay | Admitting: Family Medicine

## 2021-05-26 HISTORY — PX: HOT HEMOSTASIS: SHX5433

## 2021-05-26 HISTORY — PX: HEMOSTASIS CLIP PLACEMENT: SHX6857

## 2021-05-26 HISTORY — PX: SCLEROTHERAPY: SHX6841

## 2021-05-26 HISTORY — PX: ESOPHAGOGASTRODUODENOSCOPY (EGD) WITH PROPOFOL: SHX5813

## 2021-05-26 LAB — GLUCOSE, CAPILLARY
Glucose-Capillary: 145 mg/dL — ABNORMAL HIGH (ref 70–99)
Glucose-Capillary: 137 mg/dL — ABNORMAL HIGH (ref 70–99)
Glucose-Capillary: 164 mg/dL — ABNORMAL HIGH (ref 70–99)
Glucose-Capillary: 149 mg/dL — ABNORMAL HIGH (ref 70–99)

## 2021-05-26 LAB — CBC
HCT: 18.6 % — ABNORMAL LOW (ref 36.0–46.0)
Hemoglobin: 6 g/dL — CL (ref 12.0–15.0)
MCH: 29.6 pg (ref 26.0–34.0)
MCHC: 32.3 g/dL (ref 30.0–36.0)
MCV: 91.6 fL (ref 80.0–100.0)
Platelets: 210 10*3/uL (ref 150–400)
RBC: 2.03 MIL/uL — ABNORMAL LOW (ref 3.87–5.11)
RDW: 17.7 % — ABNORMAL HIGH (ref 11.5–15.5)
WBC: 6.4 10*3/uL (ref 4.0–10.5)
nRBC: 0.5 % — ABNORMAL HIGH (ref 0.0–0.2)

## 2021-05-26 LAB — HEMOGLOBIN AND HEMATOCRIT, BLOOD
HCT: 22.7 % — ABNORMAL LOW (ref 36.0–46.0)
HCT: 25.2 % — ABNORMAL LOW (ref 36.0–46.0)
Hemoglobin: 7.3 g/dL — ABNORMAL LOW (ref 12.0–15.0)
Hemoglobin: 8 g/dL — ABNORMAL LOW (ref 12.0–15.0)

## 2021-05-26 LAB — COMPREHENSIVE METABOLIC PANEL
ALT: 23 U/L (ref 0–44)
AST: 22 U/L (ref 15–41)
Albumin: 2.8 g/dL — ABNORMAL LOW (ref 3.5–5.0)
Alkaline Phosphatase: 51 U/L (ref 38–126)
Anion gap: 9 (ref 5–15)
BUN: 74 mg/dL — ABNORMAL HIGH (ref 8–23)
CO2: 21 mmol/L — ABNORMAL LOW (ref 22–32)
Calcium: 8.7 mg/dL — ABNORMAL LOW (ref 8.9–10.3)
Chloride: 112 mmol/L — ABNORMAL HIGH (ref 98–111)
Creatinine, Ser: 2.77 mg/dL — ABNORMAL HIGH (ref 0.44–1.00)
GFR, Estimated: 17 mL/min — ABNORMAL LOW (ref >60)
Glucose, Bld: 141 mg/dL — ABNORMAL HIGH (ref 70–99)
Potassium: 4.2 mmol/L (ref 3.5–5.1)
Sodium: 142 mmol/L (ref 135–145)
Total Bilirubin: 0.3 mg/dL (ref 0.3–1.2)
Total Protein: 5.4 g/dL — ABNORMAL LOW (ref 6.5–8.1)

## 2021-05-26 LAB — IRON AND TIBC
Iron: 25 ug/dL — ABNORMAL LOW (ref 28–170)
Saturation Ratios: 8 % — ABNORMAL LOW (ref 10.4–31.8)
TIBC: 308 ug/dL (ref 250–450)
UIBC: 283 ug/dL

## 2021-05-26 LAB — FERRITIN: Ferritin: 89 ng/mL (ref 11–307)

## 2021-05-26 LAB — MAGNESIUM: Magnesium: 1.9 mg/dL (ref 1.7–2.4)

## 2021-05-26 LAB — PREPARE RBC (CROSSMATCH)

## 2021-05-26 LAB — PHOSPHORUS: Phosphorus: 5 mg/dL — ABNORMAL HIGH (ref 2.5–4.6)

## 2021-05-26 SURGERY — ESOPHAGOGASTRODUODENOSCOPY (EGD) WITH PROPOFOL
Anesthesia: General

## 2021-05-26 MED ORDER — SENNOSIDES-DOCUSATE SODIUM 8.6-50 MG PO TABS: 1.0000 | ORAL_TABLET | Freq: Every evening | ORAL | Status: DC | PRN

## 2021-05-26 MED ORDER — SODIUM CHLORIDE 0.9% IV SOLUTION: Freq: Once | INTRAVENOUS | Status: AC

## 2021-05-26 MED ORDER — SUCCINYLCHOLINE CHLORIDE 200 MG/10ML IV SOSY
PREFILLED_SYRINGE | INTRAVENOUS | Status: DC | PRN
  Administered 2021-05-26: 120 mg via INTRAVENOUS

## 2021-05-26 MED ORDER — ONDANSETRON HCL 4 MG/2ML IJ SOLN
INTRAMUSCULAR | Status: DC | PRN
  Administered 2021-05-26: 4 mg via INTRAVENOUS

## 2021-05-26 MED ORDER — EPINEPHRINE 1 MG/10ML IJ SOSY
PREFILLED_SYRINGE | INTRAMUSCULAR | Status: AC
  Filled 2021-05-26: qty 10

## 2021-05-26 MED ORDER — SODIUM CHLORIDE 0.9 % IV SOLN: INTRAVENOUS | Status: DC

## 2021-05-26 MED ORDER — ONDANSETRON HCL 4 MG/2ML IJ SOLN
INTRAMUSCULAR | Status: AC
  Filled 2021-05-26: qty 2

## 2021-05-26 MED ORDER — DEXAMETHASONE SODIUM PHOSPHATE 10 MG/ML IJ SOLN
INTRAMUSCULAR | Status: DC | PRN
  Administered 2021-05-26: 4 mg via INTRAVENOUS

## 2021-05-26 MED ORDER — SODIUM CHLORIDE 0.9 % IV SOLN: INTRAVENOUS | Status: AC

## 2021-05-26 MED ORDER — PROPOFOL 10 MG/ML IV BOLUS
INTRAVENOUS | Status: DC | PRN
  Administered 2021-05-26: 50 mg via INTRAVENOUS
  Administered 2021-05-26: 20 mg via INTRAVENOUS
  Administered 2021-05-26: 30 mg via INTRAVENOUS
  Administered 2021-05-26: 20 mg via INTRAVENOUS
  Administered 2021-05-26: 30 mg via INTRAVENOUS

## 2021-05-26 MED ORDER — HYDRALAZINE HCL 20 MG/ML IJ SOLN: 10.0000 mg | INTRAMUSCULAR | Status: DC | PRN

## 2021-05-26 MED ORDER — PANTOPRAZOLE SODIUM 40 MG IV SOLR
40.0000 mg | Freq: Two times a day (BID) | INTRAVENOUS | Status: DC
  Administered 2021-05-26 – 2021-05-30 (×9): 40 mg via INTRAVENOUS
  Filled 2021-05-26 (×9): qty 40

## 2021-05-26 MED ORDER — SODIUM CHLORIDE (PF) 0.9 % IJ SOLN
PREFILLED_SYRINGE | INTRAMUSCULAR | Status: DC | PRN
  Administered 2021-05-26: 2 mL

## 2021-05-26 MED ORDER — DM-GUAIFENESIN ER 30-600 MG PO TB12: 1.0000 | ORAL_TABLET | Freq: Two times a day (BID) | ORAL | Status: DC | PRN

## 2021-05-26 MED ORDER — ONDANSETRON HCL 4 MG/2ML IJ SOLN
4.0000 mg | Freq: Once | INTRAMUSCULAR | Status: AC
  Administered 2021-05-26: 4 mg via INTRAVENOUS

## 2021-05-26 MED ORDER — PROPOFOL 500 MG/50ML IV EMUL
INTRAVENOUS | Status: DC | PRN
  Administered 2021-05-26: 100 ug/kg/min via INTRAVENOUS

## 2021-05-26 SURGICAL SUPPLY — 15 items

## 2021-05-26 NOTE — Progress Notes (Signed)
PROGRESS NOTE    Erica Whitney  AOZ:[id number] DOB: Dec 30, 1940 DOA: 05/25/2021 PCP: Fleet Contras, MD   Brief Narrative:  80 year old female with history of breast cancer in remission, uterine cancer 30 years ago, HTN, peptic ulcer disease, DM2, CKD stage IIIb, insomnia, recent hospitalization 05/13/2021 for oozing duodenal ulcer and acute blood loss comes back to the ER with concerns of possible elevated blood glucose but was found to have hemoglobin of 4.2.  She was started PRBC transfusion, IV fluids and GI team was consulted.   Assessment & Plan:   Principal Problem:   Acute blood loss anemia Active Problems:   HTN (hypertension)   PUD (peptic ulcer disease)   DM2 (diabetes mellitus, type 2) (HCC)    Acute blood loss anemia, admission hemoglobin 4.2 Recent bleeding from duodenal ulcer History of peptic ulcer disease -Maintain n.p.o., PPI BID. IVF and POC accuchecks while NPO.  - Status post 2 units PRBC transfusion.  1 unit PRBC ordered this morning - Maintain hemoglobin above 7.0.  Transfuse as needed. - GI team consulted- Dr Elnoria Howard. Plans for scope this afternoon.   Essential hypertension -At home she is on hydralazine and Norvasc.  Currently on hold.  Resume as appropriate - IV hydralazine as needed   Diabetes mellitus type 2 -Recently metformin was stopped due to well-controlled blood glucose and worsening renal function - Insulin sliding scale and Accu-Chek   Acute kidney injury on CKD stage IIIb -Baseline creatinine 2.2, admission creatinine 2.8.  Gentle hydration.  Closely monitor   Depression On zoloft. Continue when tolerating PO   History of breast cancer and uterine cancer Followed outpatient by oncology. In remission.   Peripheral neuropathy - Resume gabapentin when appropriate    DVT prophylaxis: SCDs Start: 05/25/21 2114 Code Status: Full code Family Communication:  Tawana updated.   Status is: Inpatient  Remains inpatient appropriate  because:Inpatient level of care appropriate due to severity of illness  Dispo: The patient is from: Home              Anticipated d/c is to: Home              Patient currently is not medically stable to d/c.   Difficult to place patient No  Subjective: Feels ok this morning, awaiting her EGD  Review of Systems Otherwise negative except as per HPI, including: General = no fevers, chills, dizziness,  fatigue HEENT/EYES = negative for loss of vision, double vision, blurred vision,  sore throa Cardiovascular= negative for chest pain, palpitation Respiratory/lungs= negative for shortness of breath, cough, wheezing; hemoptysis,  Gastrointestinal= negative for nausea, vomiting, abdominal pain Genitourinary= negative for Dysuria MSK = Negative for arthralgia, myalgias Neurology= Negative for headache, numbness, tingling  Psychiatry= Negative for suicidal and homocidal ideation Skin= Negative for Rash   Examination: Constitutional: Not in acute distress. Elderly frail.  Respiratory: Clear to auscultation bilaterally Cardiovascular: Normal sinus rhythm, no rubs Abdomen: Nontender nondistended good bowel sounds Musculoskeletal: No edema noted Skin: No rashes seen Neurologic: CN 2-12 grossly intact.  And nonfocal Psychiatric: Normal judgment and insight. Alert and oriented x 3. Normal mood.     Objective: Vitals:   05/25/21 2232 05/26/21 0512 05/26/21 0527 05/26/21 0529  BP: (!) 163/76 (!) 182/74 (!) 171/75 (!) 171/75  Pulse: 86 80 76 76  Resp: 16 16 17 17   Temp: 98.3 F (36.8 C) 98.4 F (36.9 C) (!) 97.5 F (36.4 C) (!) 97.5 F (36.4 C)  TempSrc: Oral Oral Oral   SpO2:  97% 97% 98%   Weight:      Height:        Intake/Output Summary (Last 24 hours) at 05/26/2021 0748 Last data filed at 05/26/2021 0500 Gross per 24 hour  Intake 1900 ml  Output --  Net 1900 ml   Filed Weights   05/25/21 1917  Weight: 77.1 kg     Data Reviewed:   CBC: Recent Labs  Lab  05/25/21 1356 05/26/21 0101  WBC 6.8 6.4  NEUTROABS 5.1  --   HGB 4.2* 6.0*  HCT 14.0* 18.6*  MCV 100.7* 91.6  PLT 217 210   Basic Metabolic Panel: Recent Labs  Lab 05/25/21 1356 05/26/21 0101  NA 137 142  K 4.3 4.2  CL 109 112*  CO2 21* 21*  GLUCOSE 262* 141*  BUN 80* 74*  CREATININE 2.89* 2.77*  CALCIUM 8.7* 8.7*  MG  --  1.9  PHOS  --  5.0*   GFR: Estimated Creatinine Clearance: 16.5 mL/min (A) (by C-G formula based on SCr of 2.77 mg/dL (H)). Liver Function Tests: Recent Labs  Lab 05/25/21 1356 05/26/21 0101  AST 30 22  ALT 30 23  ALKPHOS 57 51  BILITOT 0.4 0.3  PROT 5.8* 5.4*  ALBUMIN 3.1* 2.8*   No results for input(s): LIPASE, AMYLASE in the last 168 hours. No results for input(s): AMMONIA in the last 168 hours. Coagulation Profile: No results for input(s): INR, PROTIME in the last 168 hours. Cardiac Enzymes: No results for input(s): CKTOTAL, CKMB, CKMBINDEX, TROPONINI in the last 168 hours. BNP (last 3 results) No results for input(s): PROBNP in the last 8760 hours. HbA1C: No results for input(s): HGBA1C in the last 72 hours. CBG: Recent Labs  Lab 05/25/21 1352 05/25/21 1946 05/25/21 2246  GLUCAP 243* 126* 131*   Lipid Profile: No results for input(s): CHOL, HDL, LDLCALC, TRIG, CHOLHDL, LDLDIRECT in the last 72 hours. Thyroid Function Tests: No results for input(s): TSH, T4TOTAL, FREET4, T3FREE, THYROIDAB in the last 72 hours. Anemia Panel: Recent Labs    05/26/21 0101  FERRITIN 89  TIBC 308  IRON 25*   Sepsis Labs: No results for input(s): PROCALCITON, LATICACIDVEN in the last 168 hours.  Recent Results (from the past 240 hour(s))  SARS CORONAVIRUS 2 (TAT 6-24 HRS) Nasopharyngeal Nasopharyngeal Swab     Status: None   Collection Time: 05/25/21  4:56 PM   Specimen: Nasopharyngeal Swab  Result Value Ref Range Status   SARS Coronavirus 2 NEGATIVE NEGATIVE Final    Comment: (NOTE) SARS-CoV-2 target nucleic acids are NOT  DETECTED.  The SARS-CoV-2 RNA is generally detectable in upper and lower respiratory specimens during the acute phase of infection. Negative results do not preclude SARS-CoV-2 infection, do not rule out co-infections with other pathogens, and should not be used as the sole basis for treatment or other patient management decisions. Negative results must be combined with clinical observations, patient history, and epidemiological information. The expected result is Negative.  Fact Sheet for Patients: HairSlick.no  Fact Sheet for Healthcare Providers: quierodirigir.com  This test is not yet approved or cleared by the [place] FDA and  has been authorized for detection and/or diagnosis of SARS-CoV-2 by FDA under an Emergency Use Authorization (EUA). This EUA will remain  in effect (meaning this test can be used) for the duration of the COVID-19 declaration under Se ction 564(b)(1) of the Act, 21 U.S.C. section 360bbb-3(b)(1), unless the authorization is terminated or revoked sooner.  Performed at [facility],  [address] [place], [place] [address]          Radiology Studies: No results found.      Scheduled Meds:  insulin aspart  0-6 Units Subcutaneous TID WC   pantoprazole (PROTONIX) IV  40 mg Intravenous Q24H   sodium chloride flush  3 mL Intravenous Q12H   Continuous Infusions:  sodium chloride     sodium chloride     sodium chloride       LOS: 1 day   Time spent= 35 mins    Nathalia Wismer Joline Maxcy, MD [facility]  If 7PM-7AM, please contact night-coverage  05/26/2021, 7:48 AM

## 2021-05-26 NOTE — Anesthesia Procedure Notes (Signed)
Procedure Name: MAC Date/Time: 05/26/2021 2:40 PM Performed by: Jenne Campus, CRNA Pre-anesthesia Checklist: Patient identified, Emergency Drugs available, Suction available, Patient being monitored and Timeout performed Oxygen Delivery Method: Nasal cannula

## 2021-05-26 NOTE — Interval H&P Note (Signed)
History and Physical Interval Note:  05/26/2021 2:26 PM  Erica Whitney  has presented today for surgery, with the diagnosis of Recurrent severe, symptomatic anemia requiring transfusion.  Recent diagnosis duodenal ulcer.  Present.  The various methods of treatment have been discussed with the patient and family. After consideration of risks, benefits and other options for treatment, the patient has consented to  Procedure(s): ESOPHAGOGASTRODUODENOSCOPY (EGD) WITH PROPOFOL (N/A) as a surgical intervention.  The patient's history has been reviewed, patient examined, no change in status, stable for surgery.  I have reviewed the patient's chart and labs.  Questions were answered to the patient's satisfaction.     Demoni Parmar D

## 2021-05-26 NOTE — Progress Notes (Signed)
Phlebotomy notified transfusion completed.

## 2021-05-26 NOTE — Anesthesia Postprocedure Evaluation (Signed)
Anesthesia Post Note  Patient: [name]  Procedure(s) Performed: ESOPHAGOGASTRODUODENOSCOPY (EGD) WITH PROPOFOL HEMOSTASIS CLIP PLACEMENT HOT HEMOSTASIS (ARGON PLASMA COAGULATION/BICAP)     Patient location during evaluation: PACU Anesthesia Type: General Level of consciousness: sedated Pain management: pain level controlled Vital Signs Assessment: post-procedure vital signs reviewed and stable Respiratory status: spontaneous breathing and respiratory function stable Cardiovascular status: stable Postop Assessment: no apparent nausea or vomiting Anesthetic complications: no   No notable events documented.  Last Vitals:  Vitals:   05/26/21 1608 05/26/21 1646  BP: (!) 175/61 (!) 156/69  Pulse: 75 77  Resp: (!) 21 18  Temp:  36.7 C  SpO2: 95% 96%    Last Pain:  Vitals:   05/26/21 1646  TempSrc: Oral  PainSc:                  Kentavious Michele DANIEL

## 2021-05-26 NOTE — Anesthesia Procedure Notes (Signed)
Procedure Name: Intubation Date/Time: 05/26/2021 3:05 PM Performed by: Jenne Campus, CRNA Pre-anesthesia Checklist: Patient identified, Emergency Drugs available, Suction available and Patient being monitored Patient Re-evaluated:Patient Re-evaluated prior to induction Oxygen Delivery Method: Circle System Utilized Preoxygenation: Pre-oxygenation with 100% oxygen Induction Type: IV induction Ventilation: Mask ventilation without difficulty Laryngoscope Size: Miller and 3 Grade View: Grade I Tube type: Oral Tube size: 7.0 mm Number of attempts: 1 Airway Equipment and Method: Stylet and Oral airway Placement Confirmation: ETT inserted through vocal cords under direct vision, positive ETCO2 and breath sounds checked- equal and bilateral Secured at: 21 cm Tube secured with: Tape Dental Injury: Teeth and Oropharynx as per pre-operative assessment  Comments: When scissoring open the mouth for DL, upper gums noted to be bleeding. Most likely due to bite block for EGD procedure.

## 2021-05-26 NOTE — Transfer of Care (Signed)
Immediate Anesthesia Transfer of Care Note  Patient: Lauretta J Mini  Procedure(s) Performed: ESOPHAGOGASTRODUODENOSCOPY (EGD) WITH PROPOFOL HEMOSTASIS CLIP PLACEMENT HOT HEMOSTASIS (ARGON PLASMA COAGULATION/BICAP)  Patient Location: Endoscopy Unit  Anesthesia Type:General  Level of Consciousness: oriented, drowsy and patient cooperative  Airway & Oxygen Therapy: Patient Spontanous Breathing and Patient connected to nasal cannula oxygen  Post-op Assessment: Report given to RN and Post -op Vital signs reviewed and stable  Post vital signs: Reviewed  Last Vitals:  Vitals Value Taken Time  BP 179/59 05/26/21 1548  Temp    Pulse 72 05/26/21 1551  Resp 20 05/26/21 1551  SpO2 100 % 05/26/21 1551  Vitals shown include unvalidated device data.  Last Pain:  Vitals:   05/26/21 1548  TempSrc:   PainSc: 0-No pain         Complications: No notable events documented.

## 2021-05-26 NOTE — Op Note (Signed)
[facility] Patient Name: Erica Whitney Procedure Date : 05/26/2021 MRN: [id number] Attending MD: Carol Ada , MD Date of Birth: 04/21/1941 CSN: [id number] Age: 80 Admit Type: Inpatient Procedure:                Upper GI endoscopy Indications:              Acute post hemorrhagic anemia Providers:                Carol Ada, MD, Baird Cancer, RN, Tyrone Apple, Technician, Luciana Axe, CRNA Referring MD:              Medicines:                General Anesthesia Complications:            No immediate complications. Estimated Blood Loss:     Estimated blood loss: 50 mL. Procedure:                Pre-Anesthesia Assessment:                           - Prior to the procedure, a History and Physical                            was performed, and patient medications and                            allergies were reviewed. The patient's tolerance of                            previous anesthesia was also reviewed. The risks                            and benefits of the procedure and the sedation                            options and risks were discussed with the patient.                            All questions were answered, and informed consent                            was obtained. Prior Anticoagulants: The patient has                            taken no previous anticoagulant or antiplatelet                            agents. ASA Grade Assessment: III - A patient with                            severe systemic disease. After reviewing the risks  and benefits, the patient was deemed in                            satisfactory condition to undergo the procedure.                           - Sedation was administered by an anesthesia                            professional. Deep sedation was attained.                           After obtaining informed consent, the endoscope was                            passed  under direct vision. Throughout the                            procedure, the patient's blood pressure, pulse, and                            oxygen saturations were monitored continuously. The                            GIF-H190 ([id number] Olympus gastroscope was                            introduced through the mouth, and advanced to the                            second part of duodenum. The upper GI endoscopy was                            performed with difficulty. The patient tolerated                            the procedure well. Scope In: Scope Out: Findings:      The esophagus was normal.      The stomach was normal.      One non-bleeding superficial duodenal ulcer with a visible vessel was       found in the duodenal bulb. The lesion was 3 mm in largest dimension.       Area was successfully injected with 4 mL of a 1:10,000 solution of       epinephrine for hemostasis. Coagulation for hemostasis using bipolar       probe was successful. To stop active bleeding, two hemostatic clips were       successfully placed (MR conditional). There was no bleeding at the end       of the procedure.      In the distal duodenal bulb a visible ulcer was found in the setting of       a small ulceration. This was consistent witht he prior finding during       her 05/14/2021 EGD. No active bleeding was identified, but the vessel was       ablated. It was  difficult to ablate as the patient's respirations did       not allow for a stable positioning. Two ml of 1:10,000 Epi was injected       and then multiple attempts to BICAP the area failed. The movement of the       mucosa with the respirations did not allow for a firm tamponade of her       vesel. Anesthesia then intubated the patient which allowed for       positional stability. BICAP was then applied, but this seemed to worsen       the bleeding. Two additional milliliters of 1:10,000 Epi was injected       and this arrested the bleeding.  BICAP was then reapplied to the area       with relatively good tamponade to ablate the vessel. No further bleeding       was induced. Two hemoclips were then deployed in this area in hopes of       further securing the vessel and for radiographic marking. Impression:               - Normal esophagus.                           - Normal stomach.                           - Non-bleeding duodenal ulcer with a visible                            vessel. Injected. Treated with bipolar cautery.                            Clips (MR conditional) were placed.                           - No specimens collected. Recommendation:           - Return patient to hospital ward for ongoing care.                           - NPO.                           - Continue present medications.                           - Follow HGB and transfuse if necessary.                           - Continue with PPI.                           - If bleeding recurs consult IR for embolization. Procedure Code(s):        --- Professional ---                           [phone], Esophagogastroduodenoscopy, flexible,  transoral; with control of bleeding, any method Diagnosis Code(s):        --- Professional ---                           K26.4, Chronic or unspecified duodenal ulcer with                            hemorrhage                           D62, Acute posthemorrhagic anemia CPT copyright 2019 [employer] Association. All rights reserved. The codes documented in this report are preliminary and upon coder review may  be revised to meet current compliance requirements. Carol Ada, MD Carol Ada, MD 05/26/2021 3:45:27 PM This report has been signed electronically. Number of Addenda: 0

## 2021-05-26 NOTE — H&P (View-Only) (Signed)
PROGRESS NOTE    Erica Whitney  AOZ:[id number] DOB: Dec 30, 1940 DOA: 05/25/2021 PCP: Fleet Contras, MD   Brief Narrative:  80 year old female with history of breast cancer in remission, uterine cancer 30 years ago, HTN, peptic ulcer disease, DM2, CKD stage IIIb, insomnia, recent hospitalization 05/13/2021 for oozing duodenal ulcer and acute blood loss comes back to the ER with concerns of possible elevated blood glucose but was found to have hemoglobin of 4.2.  She was started PRBC transfusion, IV fluids and GI team was consulted.   Assessment & Plan:   Principal Problem:   Acute blood loss anemia Active Problems:   HTN (hypertension)   PUD (peptic ulcer disease)   DM2 (diabetes mellitus, type 2) (HCC)    Acute blood loss anemia, admission hemoglobin 4.2 Recent bleeding from duodenal ulcer History of peptic ulcer disease -Maintain n.p.o., PPI BID. IVF and POC accuchecks while NPO.  - Status post 2 units PRBC transfusion.  1 unit PRBC ordered this morning - Maintain hemoglobin above 7.0.  Transfuse as needed. - GI team consulted- Dr Elnoria Howard. Plans for scope this afternoon.   Essential hypertension -At home she is on hydralazine and Norvasc.  Currently on hold.  Resume as appropriate - IV hydralazine as needed   Diabetes mellitus type 2 -Recently metformin was stopped due to well-controlled blood glucose and worsening renal function - Insulin sliding scale and Accu-Chek   Acute kidney injury on CKD stage IIIb -Baseline creatinine 2.2, admission creatinine 2.8.  Gentle hydration.  Closely monitor   Depression On zoloft. Continue when tolerating PO   History of breast cancer and uterine cancer Followed outpatient by oncology. In remission.   Peripheral neuropathy - Resume gabapentin when appropriate    DVT prophylaxis: SCDs Start: 05/25/21 2114 Code Status: Full code Family Communication:  Tawana updated.   Status is: Inpatient  Remains inpatient appropriate  because:Inpatient level of care appropriate due to severity of illness  Dispo: The patient is from: Home              Anticipated d/c is to: Home              Patient currently is not medically stable to d/c.   Difficult to place patient No  Subjective: Feels ok this morning, awaiting her EGD  Review of Systems Otherwise negative except as per HPI, including: General = no fevers, chills, dizziness,  fatigue HEENT/EYES = negative for loss of vision, double vision, blurred vision,  sore throa Cardiovascular= negative for chest pain, palpitation Respiratory/lungs= negative for shortness of breath, cough, wheezing; hemoptysis,  Gastrointestinal= negative for nausea, vomiting, abdominal pain Genitourinary= negative for Dysuria MSK = Negative for arthralgia, myalgias Neurology= Negative for headache, numbness, tingling  Psychiatry= Negative for suicidal and homocidal ideation Skin= Negative for Rash   Examination: Constitutional: Not in acute distress. Elderly frail.  Respiratory: Clear to auscultation bilaterally Cardiovascular: Normal sinus rhythm, no rubs Abdomen: Nontender nondistended good bowel sounds Musculoskeletal: No edema noted Skin: No rashes seen Neurologic: CN 2-12 grossly intact.  And nonfocal Psychiatric: Normal judgment and insight. Alert and oriented x 3. Normal mood.     Objective: Vitals:   05/25/21 2232 05/26/21 0512 05/26/21 0527 05/26/21 0529  BP: (!) 163/76 (!) 182/74 (!) 171/75 (!) 171/75  Pulse: 86 80 76 76  Resp: 16 16 17 17   Temp: 98.3 F (36.8 C) 98.4 F (36.9 C) (!) 97.5 F (36.4 C) (!) 97.5 F (36.4 C)  TempSrc: Oral Oral Oral   SpO2:  97% 97% 98%   Weight:      Height:        Intake/Output Summary (Last 24 hours) at 05/26/2021 0748 Last data filed at 05/26/2021 0500 Gross per 24 hour  Intake 1900 ml  Output --  Net 1900 ml   Filed Weights   05/25/21 1917  Weight: 77.1 kg     Data Reviewed:   CBC: Recent Labs  Lab  05/25/21 1356 05/26/21 0101  WBC 6.8 6.4  NEUTROABS 5.1  --   HGB 4.2* 6.0*  HCT 14.0* 18.6*  MCV 100.7* 91.6  PLT 217 210   Basic Metabolic Panel: Recent Labs  Lab 05/25/21 1356 05/26/21 0101  NA 137 142  K 4.3 4.2  CL 109 112*  CO2 21* 21*  GLUCOSE 262* 141*  BUN 80* 74*  CREATININE 2.89* 2.77*  CALCIUM 8.7* 8.7*  MG  --  1.9  PHOS  --  5.0*   GFR: Estimated Creatinine Clearance: 16.5 mL/min (A) (by C-G formula based on SCr of 2.77 mg/dL (H)). Liver Function Tests: Recent Labs  Lab 05/25/21 1356 05/26/21 0101  AST 30 22  ALT 30 23  ALKPHOS 57 51  BILITOT 0.4 0.3  PROT 5.8* 5.4*  ALBUMIN 3.1* 2.8*   No results for input(s): LIPASE, AMYLASE in the last 168 hours. No results for input(s): AMMONIA in the last 168 hours. Coagulation Profile: No results for input(s): INR, PROTIME in the last 168 hours. Cardiac Enzymes: No results for input(s): CKTOTAL, CKMB, CKMBINDEX, TROPONINI in the last 168 hours. BNP (last 3 results) No results for input(s): PROBNP in the last 8760 hours. HbA1C: No results for input(s): HGBA1C in the last 72 hours. CBG: Recent Labs  Lab 05/25/21 1352 05/25/21 1946 05/25/21 2246  GLUCAP 243* 126* 131*   Lipid Profile: No results for input(s): CHOL, HDL, LDLCALC, TRIG, CHOLHDL, LDLDIRECT in the last 72 hours. Thyroid Function Tests: No results for input(s): TSH, T4TOTAL, FREET4, T3FREE, THYROIDAB in the last 72 hours. Anemia Panel: Recent Labs    05/26/21 0101  FERRITIN 89  TIBC 308  IRON 25*   Sepsis Labs: No results for input(s): PROCALCITON, LATICACIDVEN in the last 168 hours.  Recent Results (from the past 240 hour(s))  SARS CORONAVIRUS 2 (TAT 6-24 HRS) Nasopharyngeal Nasopharyngeal Swab     Status: None   Collection Time: 05/25/21  4:56 PM   Specimen: Nasopharyngeal Swab  Result Value Ref Range Status   SARS Coronavirus 2 NEGATIVE NEGATIVE Final    Comment: (NOTE) SARS-CoV-2 target nucleic acids are NOT  DETECTED.  The SARS-CoV-2 RNA is generally detectable in upper and lower respiratory specimens during the acute phase of infection. Negative results do not preclude SARS-CoV-2 infection, do not rule out co-infections with other pathogens, and should not be used as the sole basis for treatment or other patient management decisions. Negative results must be combined with clinical observations, patient history, and epidemiological information. The expected result is Negative.  Fact Sheet for Patients: HairSlick.no  Fact Sheet for Healthcare Providers: quierodirigir.com  This test is not yet approved or cleared by the [place] FDA and  has been authorized for detection and/or diagnosis of SARS-CoV-2 by FDA under an Emergency Use Authorization (EUA). This EUA will remain  in effect (meaning this test can be used) for the duration of the COVID-19 declaration under Se ction 564(b)(1) of the Act, 21 U.S.C. section 360bbb-3(b)(1), unless the authorization is terminated or revoked sooner.  Performed at [facility],  [address] [place], [place] [address]          Radiology Studies: No results found.      Scheduled Meds:  insulin aspart  0-6 Units Subcutaneous TID WC   pantoprazole (PROTONIX) IV  40 mg Intravenous Q24H   sodium chloride flush  3 mL Intravenous Q12H   Continuous Infusions:  sodium chloride     sodium chloride     sodium chloride       LOS: 1 day   Time spent= 35 mins    Nathalia Wismer Joline Maxcy, MD [facility]  If 7PM-7AM, please contact night-coverage  05/26/2021, 7:48 AM

## 2021-05-26 NOTE — Anesthesia Preprocedure Evaluation (Addendum)
Anesthesia Evaluation  Patient identified by MRN, date of birth, ID band Patient awake    Reviewed: Allergy & Precautions, NPO status , Patient's Chart, lab work & pertinent test results  History of Anesthesia Complications Negative for: history of anesthetic complications  Airway Mallampati: I  TM Distance: >3 FB Neck ROM: Full    Dental  (+) Edentulous Upper, Dental Advisory Given, Poor Dentition   Pulmonary former smoker,    Pulmonary exam normal        Cardiovascular hypertension, Pt. on home beta blockers and Pt. on medications  Rhythm:Regular Rate:Normal + Systolic murmurs    Neuro/Psych PSYCHIATRIC DISORDERS Depression negative neurological ROS     GI/Hepatic Neg liver ROS, PUD,   Endo/Other  diabetes, Type 2  Renal/GU negative Renal ROS     Musculoskeletal  (+) Arthritis ,   Abdominal   Peds  Hematology  (+) Blood dyscrasia, anemia ,   Anesthesia Other Findings Day of surgery medications reviewed with the patient.  Reproductive/Obstetrics                            Anesthesia Physical  Anesthesia Plan  ASA: 3  Anesthesia Plan: MAC   Post-op Pain Management:    Induction: Intravenous  PONV Risk Score and Plan: 2 and Propofol infusion and Treatment may vary due to age or medical condition  Airway Management Planned: Nasal Cannula and Natural Airway  Additional Equipment:   Intra-op Plan:   Post-operative Plan:   Informed Consent: I have reviewed the patients History and Physical, chart, labs and discussed the procedure including the risks, benefits and alternatives for the proposed anesthesia with the patient or authorized representative who has indicated his/her understanding and acceptance.     Dental advisory given  Plan Discussed with: Anesthesiologist  Anesthesia Plan Comments:        Anesthesia Quick Evaluation

## 2021-05-27 ENCOUNTER — Encounter (HOSPITAL_COMMUNITY): Payer: Self-pay | Admitting: Gastroenterology

## 2021-05-27 LAB — CBC
HCT: 20.7 % — ABNORMAL LOW (ref 36.0–46.0)
Hemoglobin: 6.6 g/dL — CL (ref 12.0–15.0)
MCH: 29.2 pg (ref 26.0–34.0)
MCHC: 31.9 g/dL (ref 30.0–36.0)
MCV: 91.6 fL (ref 80.0–100.0)
Platelets: 182 10*3/uL (ref 150–400)
RBC: 2.26 MIL/uL — ABNORMAL LOW (ref 3.87–5.11)
RDW: 16.4 % — ABNORMAL HIGH (ref 11.5–15.5)
WBC: 5.5 10*3/uL (ref 4.0–10.5)
nRBC: 0 % (ref 0.0–0.2)

## 2021-05-27 LAB — GLUCOSE, CAPILLARY
Glucose-Capillary: 85 mg/dL (ref 70–99)
Glucose-Capillary: 120 mg/dL — ABNORMAL HIGH (ref 70–99)
Glucose-Capillary: 133 mg/dL — ABNORMAL HIGH (ref 70–99)

## 2021-05-27 LAB — BASIC METABOLIC PANEL
Anion gap: 9 (ref 5–15)
BUN: 60 mg/dL — ABNORMAL HIGH (ref 8–23)
CO2: 20 mmol/L — ABNORMAL LOW (ref 22–32)
Calcium: 8.2 mg/dL — ABNORMAL LOW (ref 8.9–10.3)
Chloride: 112 mmol/L — ABNORMAL HIGH (ref 98–111)
Creatinine, Ser: 2.39 mg/dL — ABNORMAL HIGH (ref 0.44–1.00)
GFR, Estimated: 20 mL/min — ABNORMAL LOW (ref >60)
Glucose, Bld: 138 mg/dL — ABNORMAL HIGH (ref 70–99)
Potassium: 4.5 mmol/L (ref 3.5–5.1)
Sodium: 141 mmol/L (ref 135–145)

## 2021-05-27 LAB — MAGNESIUM: Magnesium: 1.8 mg/dL (ref 1.7–2.4)

## 2021-05-27 LAB — HEMOGLOBIN AND HEMATOCRIT, BLOOD
HCT: 25.3 % — ABNORMAL LOW (ref 36.0–46.0)
Hemoglobin: 8 g/dL — ABNORMAL LOW (ref 12.0–15.0)

## 2021-05-27 LAB — PREPARE RBC (CROSSMATCH)

## 2021-05-27 MED ORDER — SODIUM CHLORIDE 0.9 % IV SOLN
510.0000 mg | Freq: Once | INTRAVENOUS | Status: AC
  Administered 2021-05-27: 510 mg via INTRAVENOUS
  Filled 2021-05-27: qty 17

## 2021-05-27 MED ORDER — GABAPENTIN 100 MG PO CAPS
100.0000 mg | ORAL_CAPSULE | Freq: Two times a day (BID) | ORAL | Status: DC
  Administered 2021-05-27 – 2021-05-30 (×7): 100 mg via ORAL
  Filled 2021-05-27 (×7): qty 1

## 2021-05-27 MED ORDER — ATORVASTATIN CALCIUM 40 MG PO TABS
40.0000 mg | ORAL_TABLET | Freq: Every day | ORAL | Status: DC
  Administered 2021-05-27 – 2021-05-30 (×4): 40 mg via ORAL
  Filled 2021-05-27 (×4): qty 1

## 2021-05-27 MED ORDER — SODIUM CHLORIDE 0.9% IV SOLUTION: Freq: Once | INTRAVENOUS | Status: AC

## 2021-05-27 MED ORDER — FERROUS SULFATE 325 (65 FE) MG PO TABS
325.0000 mg | ORAL_TABLET | Freq: Every day | ORAL | Status: DC
  Administered 2021-05-28 – 2021-05-30 (×3): 325 mg via ORAL
  Filled 2021-05-27 (×3): qty 1

## 2021-05-27 MED ORDER — SENNOSIDES-DOCUSATE SODIUM 8.6-50 MG PO TABS
1.0000 | ORAL_TABLET | Freq: Every day | ORAL | Status: DC
  Administered 2021-05-28 – 2021-05-29 (×2): 1 via ORAL
  Filled 2021-05-27 (×2): qty 1

## 2021-05-27 MED ORDER — POLYETHYLENE GLYCOL 3350 17 G PO PACK: 17.0000 g | PACK | Freq: Every day | ORAL | Status: DC | PRN

## 2021-05-27 MED ORDER — MIRTAZAPINE 15 MG PO TABS
30.0000 mg | ORAL_TABLET | Freq: Every day | ORAL | Status: DC
  Administered 2021-05-27 – 2021-05-29 (×3): 30 mg via ORAL
  Filled 2021-05-27 (×3): qty 2

## 2021-05-27 MED ORDER — SERTRALINE HCL 100 MG PO TABS
100.0000 mg | ORAL_TABLET | Freq: Every day | ORAL | Status: DC
  Administered 2021-05-27 – 2021-05-30 (×4): 100 mg via ORAL
  Filled 2021-05-27 (×4): qty 1

## 2021-05-27 NOTE — Progress Notes (Signed)
He will PROGRESS NOTE    Erica Whitney  ZOX:[id number] DOB: [birth year] DOA: 05/25/2021 PCP: Fleet Contras, MD   Brief Narrative:  80 year old female with history of breast cancer in remission, uterine cancer 30 years ago, HTN, peptic ulcer disease, DM2, CKD stage IIIb, insomnia, recent hospitalization 05/13/2021 for oozing duodenal ulcer and acute blood loss comes back to the ER with concerns of possible elevated blood glucose but was found to have hemoglobin of 4.2.  She was started PRBC transfusion, IV fluids and GI team was consulted.  EGD showed normal esophagus, stomach, nonbleeding duodenal ulcer with visible vessel treated with cautery and clips placed.   Assessment & Plan:   Principal Problem:   Acute blood loss anemia Active Problems:   HTN (hypertension)   PUD (peptic ulcer disease)   DM2 (diabetes mellitus, type 2) (HCC)    Acute blood loss anemia, admission hemoglobin 4.2 Recent bleeding from duodenal ulcer History of peptic ulcer disease -Status post EGD.  Clears for now.  PPI IV twice daily. -Status post 4 units PRBC transfusion. Order One dose IV iron then daily PO with bowel regimen.  - Hemoglobin 6.6, maintain greater than 7.0 -GI following.  Dr. Elnoria Howard. -EGD 6/9= normal esophagus, stomach, nonbleeding duodenal ulcer with visible vessel treated with cautery and clips placed.  Essential hypertension -At home she is on hydralazine and Norvasc.  Currently on hold.  Resume as appropriate - IV hydralazine as needed   Diabetes mellitus type 2 -Recently metformin was stopped due to well-controlled blood glucose and worsening renal function - Insulin sliding scale and Accu-Chek   Acute kidney injury on CKD stage IIIb, Cr 2.39 -Baseline creatinine 2.2, admission creatinine 2.8.  Gentle hydration.  Closely monitor. Improving.    Depression On zoloft. Continue when tolerating PO   History of breast cancer and uterine cancer Followed outpatient by oncology. In  remission.   Peripheral neuropathy - Resume gabapentin when appropriate    DVT prophylaxis: SCDs Start: 05/25/21 2114 Code Status: Full code Family Communication:  Tawana Updated but no answer [phone]. Left a VM  Status is: Inpatient  Remains inpatient appropriate because:Inpatient level of care appropriate due to severity of illness  Dispo: The patient is from: Home              Anticipated d/c is to: Home              Patient currently is not medically stable to d/c.  Continues to have elopement, getting PRBC transfusion.  Home when cleared by GI   Difficult to place patient No  Subjective: Upset stomach but no bowel movement yet.  Had 1UPRBC overnight.    Examination: Constitutional: Not in acute distress Respiratory: Clear to auscultation bilaterally Cardiovascular: Normal sinus rhythm, no rubs Abdomen: Nontender nondistended good bowel sounds Musculoskeletal: No edema noted Skin: No rashes seen Neurologic: CN 2-12 grossly intact.  And nonfocal Psychiatric: Normal judgment and insight. Alert and oriented x 3. Normal mood.     Objective: Vitals:   05/26/21 2300 05/27/21 0345 05/27/21 0416 05/27/21 0423  BP: (!) 158/78 (!) 157/76 (!) 154/75 (!) 154/75  Pulse: 77 61 63 63  Resp: 16 18 17 17   Temp: 98.8 F (37.1 C) 97.9 F (36.6 C) 98.4 F (36.9 C) 98.4 F (36.9 C)  TempSrc: Oral Oral  Oral  SpO2:  100%  98%  Weight:      Height:        Intake/Output Summary (Last 24 hours) at  05/27/2021 0741 Last data filed at 05/27/2021 0600 Gross per 24 hour  Intake 1483.33 ml  Output 2403 ml  Net -919.67 ml   Filed Weights   05/25/21 1917 05/26/21 1345  Weight: 77.1 kg 77.1 kg     Data Reviewed:   CBC: Recent Labs  Lab 05/25/21 1356 05/26/21 0101 05/26/21 1025 05/26/21 1833 05/27/21 0057  WBC 6.8 6.4  --   --  5.5  NEUTROABS 5.1  --   --   --   --   HGB 4.2* 6.0* 7.3* 8.0* 6.6*  HCT 14.0* 18.6* 22.7* 25.2* 20.7*  MCV 100.7* 91.6  --   --  91.6   PLT 217 210  --   --  182   Basic Metabolic Panel: Recent Labs  Lab 05/25/21 1356 05/26/21 0101 05/27/21 0057  NA 137 142 141  K 4.3 4.2 4.5  CL 109 112* 112*  CO2 21* 21* 20*  GLUCOSE 262* 141* 138*  BUN 80* 74* 60*  CREATININE 2.89* 2.77* 2.39*  CALCIUM 8.7* 8.7* 8.2*  MG  --  1.9 1.8  PHOS  --  5.0*  --    GFR: Estimated Creatinine Clearance: 19.1 mL/min (A) (by C-G formula based on SCr of 2.39 mg/dL (H)). Liver Function Tests: Recent Labs  Lab 05/25/21 1356 05/26/21 0101  AST 30 22  ALT 30 23  ALKPHOS 57 51  BILITOT 0.4 0.3  PROT 5.8* 5.4*  ALBUMIN 3.1* 2.8*   No results for input(s): LIPASE, AMYLASE in the last 168 hours. No results for input(s): AMMONIA in the last 168 hours. Coagulation Profile: No results for input(s): INR, PROTIME in the last 168 hours. Cardiac Enzymes: No results for input(s): CKTOTAL, CKMB, CKMBINDEX, TROPONINI in the last 168 hours. BNP (last 3 results) No results for input(s): PROBNP in the last 8760 hours. HbA1C: No results for input(s): HGBA1C in the last 72 hours. CBG: Recent Labs  Lab 05/25/21 2246 05/26/21 0759 05/26/21 1203 05/26/21 1644 05/26/21 2035  GLUCAP 131* 145* 137* 164* 149*   Lipid Profile: No results for input(s): CHOL, HDL, LDLCALC, TRIG, CHOLHDL, LDLDIRECT in the last 72 hours. Thyroid Function Tests: No results for input(s): TSH, T4TOTAL, FREET4, T3FREE, THYROIDAB in the last 72 hours. Anemia Panel: Recent Labs    05/26/21 0101  FERRITIN 89  TIBC 308  IRON 25*   Sepsis Labs: No results for input(s): PROCALCITON, LATICACIDVEN in the last 168 hours.  Recent Results (from the past 240 hour(s))  SARS CORONAVIRUS 2 (TAT 6-24 HRS) Nasopharyngeal Nasopharyngeal Swab     Status: None   Collection Time: 05/25/21  4:56 PM   Specimen: Nasopharyngeal Swab  Result Value Ref Range Status   SARS Coronavirus 2 NEGATIVE NEGATIVE Final    Comment: (NOTE) SARS-CoV-2 target nucleic acids are NOT  DETECTED.  The SARS-CoV-2 RNA is generally detectable in upper and lower respiratory specimens during the acute phase of infection. Negative results do not preclude SARS-CoV-2 infection, do not rule out co-infections with other pathogens, and should not be used as the sole basis for treatment or other patient management decisions. Negative results must be combined with clinical observations, patient history, and epidemiological information. The expected result is Negative.  Fact Sheet for Patients: HairSlick.no  Fact Sheet for Healthcare Providers: quierodirigir.com  This test is not yet approved or cleared by the [place] FDA and  has been authorized for detection and/or diagnosis of SARS-CoV-2 by FDA under an Emergency Use Authorization (EUA). This EUA will remain  in effect (meaning this test can be used) for the duration of the COVID-19 declaration under Se ction 564(b)(1) of the Act, 21 U.S.C. section 360bbb-3(b)(1), unless the authorization is terminated or revoked sooner.  Performed at [facility], [address] [place], [place] [address]          Radiology Studies: No results found.      Scheduled Meds:  insulin aspart  0-6 Units Subcutaneous TID WC   pantoprazole (PROTONIX) IV  40 mg Intravenous Q12H   sodium chloride flush  3 mL Intravenous Q12H   Continuous Infusions:  sodium chloride     sodium chloride     sodium chloride 75 mL/hr at 05/26/21 1433   sodium chloride       LOS: 2 days   Time spent= 35 mins    Traeson Dusza Joline Maxcy, MD [facility]  If 7PM-7AM, please contact night-coverage  05/27/2021, 7:41 AM

## 2021-05-27 NOTE — Progress Notes (Signed)
Subjective: She reports feeling "sick".  Objective: Vital signs in last 24 hours: Temp:  [97.9 F (36.6 C)-98.9 F (37.2 C)] 98.4 F (36.9 C) (06/10 0423) Pulse Rate:  [61-85] 63 (06/10 0423) Resp:  [14-21] 17 (06/10 0423) BP: (154-188)/(56-78) 154/75 (06/10 0423) SpO2:  [95 %-100 %] 98 % (06/10 0423) Weight:  [77.1 kg] 77.1 kg (06/09 1345)    Intake/Output from previous day: 06/09 0701 - 06/10 0700 In: 1483.3 [I.V.:1050; Blood:433.3] Out: 2403 [Urine:2400; Blood:3] Intake/Output this shift: No intake/output data recorded.  General appearance: alert, no distress, and she does not feel well GI: soft, non-tender; bowel sounds normal; no masses,  no organomegaly  Lab Results: Recent Labs    05/25/21 1356 05/26/21 0101 05/26/21 1025 05/26/21 1833 05/27/21 0057  WBC 6.8 6.4  --   --  5.5  HGB 4.2* 6.0* 7.3* 8.0* 6.6*  HCT 14.0* 18.6* 22.7* 25.2* 20.7*  PLT 217 210  --   --  182   BMET Recent Labs    05/25/21 1356 05/26/21 0101 05/27/21 0057  NA 137 142 141  K 4.3 4.2 4.5  CL 109 112* 112*  CO2 21* 21* 20*  GLUCOSE 262* 141* 138*  BUN 80* 74* 60*  CREATININE 2.89* 2.77* 2.39*  CALCIUM 8.7* 8.7* 8.2*   LFT Recent Labs    05/26/21 0101  PROT 5.4*  ALBUMIN 2.8*  AST 22  ALT 23  ALKPHOS 51  BILITOT 0.3   PT/INR No results for input(s): LABPROT, INR in the last 72 hours. Hepatitis Panel No results for input(s): HEPBSAG, HCVAB, HEPAIGM, HEPBIGM in the last 72 hours. C-Diff No results for input(s): CDIFFTOX in the last 72 hours. Fecal Lactopherrin No results for input(s): FECLLACTOFRN in the last 72 hours.  Studies/Results: No results found.  Medications: Scheduled:  atorvastatin  40 mg Oral Daily   [START ON 05/28/2021] ferrous sulfate  325 mg Oral Q breakfast   gabapentin  100 mg Oral BID   insulin aspart  0-6 Units Subcutaneous TID WC   mirtazapine  30 mg Oral QHS   pantoprazole (PROTONIX) IV  40 mg Intravenous Q12H   senna-docusate  1 tablet  Oral QHS   sertraline  100 mg Oral Daily   sodium chloride flush  3 mL Intravenous Q12H   Continuous:  sodium chloride     sodium chloride     sodium chloride Stopped (05/27/21 0943)   sodium chloride      Assessment/Plan: 1) Bleeding duodenal ulcer s/p BICAP and hemoclipping. 2) Anemia.   The patient was noted to have a drop in her HGB.  She did bleed during the EGD yesterday, but it was not felt to be to units of PRBC.  This AM she states that she feels "sick", but there were no reports of vomiting or abdominal pain.  The patient does not report any bowel movements after the procedure.  It may be that the anemia was worsened with the bleeding during the EGD, but that she was still equilibrating.  There were no reports of bleeding post EGD.  Her abdominal examination was also benign.  Plan: 1) Agree with the transfusion. 2) Follow HGB. 3) If she continues to bleed IR intervention will be required. 4) Advance to a clear liquid diet.  LOS: 2 days   Sayra Frisby D 05/27/2021, 10:07 AM

## 2021-05-27 NOTE — Care Management Important Message (Signed)
Important Message  Patient Details  Name: Erica Whitney MRN: [id number] Date of Birth: 07-26-1941   Medicare Important Message Given:  Yes     Siddarth Hsiung Montine Circle 05/27/2021, 11:23 AM

## 2021-05-28 DIAGNOSIS — K264 Chronic or unspecified duodenal ulcer with hemorrhage: Secondary | ICD-10-CM

## 2021-05-28 LAB — BPAM RBC
Blood Product Expiration Date: 202207092359
Blood Product Expiration Date: 202207092359
Blood Product Expiration Date: 202207102359
Blood Product Expiration Date: 202207112359
ISSUE DATE / TIME: 202206081626
ISSUE DATE / TIME: 202206081847
ISSUE DATE / TIME: 202206090502
ISSUE DATE / TIME: 202206100352
Unit Type and Rh: 5100
Unit Type and Rh: 5100
Unit Type and Rh: 5100
Unit Type and Rh: 5100

## 2021-05-28 LAB — GLUCOSE, CAPILLARY
Glucose-Capillary: 95 mg/dL (ref 70–99)
Glucose-Capillary: 130 mg/dL — ABNORMAL HIGH (ref 70–99)
Glucose-Capillary: 88 mg/dL (ref 70–99)
Glucose-Capillary: 121 mg/dL — ABNORMAL HIGH (ref 70–99)

## 2021-05-28 LAB — TYPE AND SCREEN
ABO/RH(D): O POS
Antibody Screen: NEGATIVE
Unit division: 0
Unit division: 0
Unit division: 0
Unit division: 0

## 2021-05-28 LAB — CBC
HCT: 25.4 % — ABNORMAL LOW (ref 36.0–46.0)
Hemoglobin: 8.2 g/dL — ABNORMAL LOW (ref 12.0–15.0)
MCH: 29.1 pg (ref 26.0–34.0)
MCHC: 32.3 g/dL (ref 30.0–36.0)
MCV: 90.1 fL (ref 80.0–100.0)
Platelets: 191 10*3/uL (ref 150–400)
RBC: 2.82 MIL/uL — ABNORMAL LOW (ref 3.87–5.11)
RDW: 16.2 % — ABNORMAL HIGH (ref 11.5–15.5)
WBC: 7.2 10*3/uL (ref 4.0–10.5)
nRBC: 0 % (ref 0.0–0.2)

## 2021-05-28 LAB — MAGNESIUM: Magnesium: 1.9 mg/dL (ref 1.7–2.4)

## 2021-05-28 LAB — BASIC METABOLIC PANEL
Anion gap: 9 (ref 5–15)
BUN: 48 mg/dL — ABNORMAL HIGH (ref 8–23)
CO2: 22 mmol/L (ref 22–32)
Calcium: 8.7 mg/dL — ABNORMAL LOW (ref 8.9–10.3)
Chloride: 111 mmol/L (ref 98–111)
Creatinine, Ser: 2.35 mg/dL — ABNORMAL HIGH (ref 0.44–1.00)
GFR, Estimated: 21 mL/min — ABNORMAL LOW (ref >60)
Glucose, Bld: 113 mg/dL — ABNORMAL HIGH (ref 70–99)
Potassium: 4.1 mmol/L (ref 3.5–5.1)
Sodium: 142 mmol/L (ref 135–145)

## 2021-05-28 MED ORDER — HYDRALAZINE HCL 50 MG PO TABS
50.0000 mg | ORAL_TABLET | Freq: Three times a day (TID) | ORAL | Status: DC
  Administered 2021-05-28 – 2021-05-30 (×7): 50 mg via ORAL
  Filled 2021-05-28 (×7): qty 1

## 2021-05-28 MED ORDER — LORATADINE 10 MG PO TABS: 10.0000 mg | ORAL_TABLET | Freq: Every day | ORAL | Status: DC | PRN

## 2021-05-28 MED ORDER — AMLODIPINE BESYLATE 10 MG PO TABS
10.0000 mg | ORAL_TABLET | Freq: Every day | ORAL | Status: DC
  Administered 2021-05-28 – 2021-05-30 (×3): 10 mg via ORAL
  Filled 2021-05-28 (×3): qty 1

## 2021-05-28 NOTE — Progress Notes (Signed)
He will PROGRESS NOTE    Erica Whitney  HKV:[id number] DOB: [birth year] DOA: 05/25/2021 PCP: Fleet Contras, MD   Brief Narrative:  80 year old female with history of breast cancer in remission, uterine cancer 30 years ago, HTN, peptic ulcer disease, DM2, CKD stage IIIb, insomnia, recent hospitalization 05/13/2021 for oozing duodenal ulcer and acute blood loss comes back to the ER with concerns of possible elevated blood glucose but was found to have hemoglobin of 4.2.  She was started PRBC transfusion, IV fluids and GI team was consulted.  EGD showed normal esophagus, stomach, nonbleeding duodenal ulcer with visible vessel treated with cautery and clips placed.   Assessment & Plan:   Principal Problem:   Acute blood loss anemia Active Problems:   HTN (hypertension)   PUD (peptic ulcer disease)   DM2 (diabetes mellitus, type 2) (HCC)    Acute blood loss anemia, admission hemoglobin 4.2 Recent bleeding from duodenal ulcer History of peptic ulcer disease -Clear liquid diet.  PPI twice daily. -Status post 4 units PRBC transfusion. Order One dose IV iron then daily PO with bowel regimen.  -Hemoglobin today 8.2. -GI following.  Dr. Elnoria Howard. -EGD 6/9= normal esophagus, stomach, nonbleeding duodenal ulcer with visible vessel treated with cautery and clips placed.  Essential hypertension -Resume Norvasc and hydralazine today. - IV hydralazine as needed   Diabetes mellitus type 2 -Recently metformin was stopped due to well-controlled blood glucose and worsening renal function - Insulin sliding scale and Accu-Chek   Acute kidney injury on CKD stage IIIb, Cr 2.39 -Baseline creatinine 2.2, admission creatinine 2.8.  Gentle hydration.  Closely monitor. Improving.    Depression On zoloft. Continue when tolerating PO   History of breast cancer and uterine cancer Followed outpatient by oncology. In remission.   Peripheral neuropathy - Resume gabapentin when appropriate    DVT  prophylaxis: SCDs Start: 05/25/21 2114 Code Status: Full code Family Communication:  Tawana Updated but no answer [phone]. Left a VM 6/10  Status is: Inpatient  Remains inpatient appropriate because:Inpatient level of care appropriate due to severity of illness  Dispo: The patient is from: Home              Anticipated d/c is to: Home              Patient currently is not medically stable to d/c.  Continues to have elopement, getting PRBC transfusion.  Home when cleared by GI   Difficult to place patient No  Subjective: Had dark liquid stool this morning.  Tells me overall her body feels stiff therefore I advised her to ambulate herself get out of bed to chair and do passive exercises in the bed.  She understands this.   Examination: Constitutional: Not in acute distress Respiratory: Clear to auscultation bilaterally Cardiovascular: Normal sinus rhythm, no rubs Abdomen: Nontender nondistended good bowel sounds Musculoskeletal: No edema noted Skin: No rashes seen Neurologic: CN 2-12 grossly intact.  And nonfocal Psychiatric: Normal judgment and insight. Alert and oriented x 3. Normal mood.   Objective: Vitals:   05/27/21 0416 05/27/21 0423 05/27/21 1206 05/27/21 1944  BP: (!) 154/75 (!) 154/75 (!) 191/74 (!) 169/74  Pulse: 63 63 67 65  Resp: 17 17 18 16   Temp: 98.4 F (36.9 C) 98.4 F (36.9 C) 98.2 F (36.8 C)   TempSrc:  Oral    SpO2:  98% 97% 95%  Weight:      Height:        Intake/Output Summary (Last 24  hours) at 05/28/2021 1010 Last data filed at 05/27/2021 1700 Gross per 24 hour  Intake 300 ml  Output --  Net 300 ml   Filed Weights   05/25/21 1917 05/26/21 1345  Weight: 77.1 kg 77.1 kg     Data Reviewed:   CBC: Recent Labs  Lab 05/25/21 1356 05/26/21 0101 05/26/21 1025 05/26/21 1833 05/27/21 0057 05/27/21 0932 05/28/21 0407  WBC 6.8 6.4  --   --  5.5  --  7.2  NEUTROABS 5.1  --   --   --   --   --   --   HGB 4.2* 6.0* 7.3* 8.0* 6.6* 8.0*  8.2*  HCT 14.0* 18.6* 22.7* 25.2* 20.7* 25.3* 25.4*  MCV 100.7* 91.6  --   --  91.6  --  90.1  PLT 217 210  --   --  182  --  191   Basic Metabolic Panel: Recent Labs  Lab 05/25/21 1356 05/26/21 0101 05/27/21 0057 05/28/21 0407  NA 137 142 141 142  K 4.3 4.2 4.5 4.1  CL 109 112* 112* 111  CO2 21* 21* 20* 22  GLUCOSE 262* 141* 138* 113*  BUN 80* 74* 60* 48*  CREATININE 2.89* 2.77* 2.39* 2.35*  CALCIUM 8.7* 8.7* 8.2* 8.7*  MG  --  1.9 1.8 1.9  PHOS  --  5.0*  --   --    GFR: Estimated Creatinine Clearance: 19.4 mL/min (A) (by C-G formula based on SCr of 2.35 mg/dL (H)). Liver Function Tests: Recent Labs  Lab 05/25/21 1356 05/26/21 0101  AST 30 22  ALT 30 23  ALKPHOS 57 51  BILITOT 0.4 0.3  PROT 5.8* 5.4*  ALBUMIN 3.1* 2.8*   No results for input(s): LIPASE, AMYLASE in the last 168 hours. No results for input(s): AMMONIA in the last 168 hours. Coagulation Profile: No results for input(s): INR, PROTIME in the last 168 hours. Cardiac Enzymes: No results for input(s): CKTOTAL, CKMB, CKMBINDEX, TROPONINI in the last 168 hours. BNP (last 3 results) No results for input(s): PROBNP in the last 8760 hours. HbA1C: No results for input(s): HGBA1C in the last 72 hours. CBG: Recent Labs  Lab 05/26/21 2035 05/27/21 0749 05/27/21 1206 05/27/21 1628 05/28/21 0750  GLUCAP 149* 85 120* 133* 95   Lipid Profile: No results for input(s): CHOL, HDL, LDLCALC, TRIG, CHOLHDL, LDLDIRECT in the last 72 hours. Thyroid Function Tests: No results for input(s): TSH, T4TOTAL, FREET4, T3FREE, THYROIDAB in the last 72 hours. Anemia Panel: Recent Labs    05/26/21 0101  FERRITIN 89  TIBC 308  IRON 25*   Sepsis Labs: No results for input(s): PROCALCITON, LATICACIDVEN in the last 168 hours.  Recent Results (from the past 240 hour(s))  SARS CORONAVIRUS 2 (TAT 6-24 HRS) Nasopharyngeal Nasopharyngeal Swab     Status: None   Collection Time: 05/25/21  4:56 PM   Specimen: Nasopharyngeal  Swab  Result Value Ref Range Status   SARS Coronavirus 2 NEGATIVE NEGATIVE Final    Comment: (NOTE) SARS-CoV-2 target nucleic acids are NOT DETECTED.  The SARS-CoV-2 RNA is generally detectable in upper and lower respiratory specimens during the acute phase of infection. Negative results do not preclude SARS-CoV-2 infection, do not rule out co-infections with other pathogens, and should not be used as the sole basis for treatment or other patient management decisions. Negative results must be combined with clinical observations, patient history, and epidemiological information. The expected result is Negative.  Fact Sheet for Patients: HairSlick.no  Fact Sheet  for Healthcare Providers: quierodirigir.com  This test is not yet approved or cleared by the [place] and  has been authorized for detection and/or diagnosis of SARS-CoV-2 by FDA under an Emergency Use Authorization (EUA). This EUA will remain  in effect (meaning this test can be used) for the duration of the COVID-19 declaration under Se ction 564(b)(1) of the Act, 21 U.S.C. section 360bbb-3(b)(1), unless the authorization is terminated or revoked sooner.  Performed at [facility], [address], [place], [place] [address]          Radiology Studies: No results found.      Scheduled Meds:  atorvastatin  40 mg Oral Daily   ferrous sulfate  325 mg Oral Q breakfast   gabapentin  100 mg Oral BID   insulin aspart  0-6 Units Subcutaneous TID WC   mirtazapine  30 mg Oral QHS   pantoprazole (PROTONIX) IV  40 mg Intravenous Q12H   senna-docusate  1 tablet Oral QHS   sertraline  100 mg Oral Daily   sodium chloride flush  3 mL Intravenous Q12H   Continuous Infusions:  sodium chloride     sodium chloride     sodium chloride       LOS: 3 days   Time spent= 35 mins    Makenzie Weisner Joline Maxcy, MD [facility]  If 7PM-7AM, please  contact night-coverage  05/28/2021, 10:10 AM

## 2021-05-28 NOTE — Progress Notes (Signed)
  Progress Note Covering for Drs. Ohlman having dark stools. Feels better. Wants more food.    Objective  Vital signs in last 24 hours: Temp:  [98.1 F (36.7 C)] 98.1 F (36.7 C) (06/11 1428) Pulse Rate:  [63-65] 63 (06/11 1428) Resp:  [16-17] 17 (06/11 1428) BP: (169-177)/(68-80) 170/68 (06/11 1428) SpO2:  [95 %-98 %] 98 % (06/11 1428)    General: Alert, well-developed, in NAD Heart:  Regular rate and rhythm; no murmurs Chest: Clear to ascultation bilaterally Abdomen:  Soft, nontender and nondistended. Normal bowel sounds, without guarding, and without rebound.   Extremities:  Without edema. Neurologic:  Alert and  oriented x4; grossly normal neurologically. Psych:  Alert and cooperative. Normal mood and affect.  Intake/Output from previous day: 06/10 0701 - 06/11 0700 In: 300 [P.O.:300] Out: -  Intake/Output this shift: No intake/output data recorded.  Lab Results: Recent Labs    05/26/21 0101 05/26/21 1025 05/27/21 0057 05/27/21 0932 05/28/21 0407  WBC 6.4  --  5.5  --  7.2  HGB 6.0*   < > 6.6* 8.0* 8.2*  HCT 18.6*   < > 20.7* 25.3* 25.4*  PLT 210  --  182  --  191   < > = values in this interval not displayed.   BMET Recent Labs    05/26/21 0101 05/27/21 0057 05/28/21 0407  NA 142 141 142  K 4.2 4.5 4.1  CL 112* 112* 111  CO2 21* 20* 22  GLUCOSE 141* 138* 113*  BUN 74* 60* 48*  CREATININE 2.77* 2.39* 2.35*  CALCIUM 8.7* 8.2* 8.7*   LFT Recent Labs    05/26/21 0101  PROT 5.4*  ALBUMIN 2.8*  AST 22  ALT 23  ALKPHOS 51  BILITOT 0.3     Assessment & Recommendations  Duodenal ulcer with bleed. S/P endoscopic therapy. Observe for rebleeding. Advance to full liquids today. Continue pantoprazole IV bid.  ABL anemia. Hgb is stable at 8.2. Trend CBC.     LOS: 3 days   Norberto Sorenson T. Fuller Plan MD 05/28/2021, 4:09 PM [phone]

## 2021-05-29 LAB — CBC
HCT: 24.5 % — ABNORMAL LOW (ref 36.0–46.0)
Hemoglobin: 7.8 g/dL — ABNORMAL LOW (ref 12.0–15.0)
MCH: 29.1 pg (ref 26.0–34.0)
MCHC: 31.8 g/dL (ref 30.0–36.0)
MCV: 91.4 fL (ref 80.0–100.0)
Platelets: 194 10*3/uL (ref 150–400)
RBC: 2.68 MIL/uL — ABNORMAL LOW (ref 3.87–5.11)
RDW: 15.8 % — ABNORMAL HIGH (ref 11.5–15.5)
WBC: 5.9 10*3/uL (ref 4.0–10.5)
nRBC: 0.7 % — ABNORMAL HIGH (ref 0.0–0.2)

## 2021-05-29 LAB — BASIC METABOLIC PANEL
Anion gap: 5 (ref 5–15)
BUN: 47 mg/dL — ABNORMAL HIGH (ref 8–23)
CO2: 23 mmol/L (ref 22–32)
Calcium: 8.6 mg/dL — ABNORMAL LOW (ref 8.9–10.3)
Chloride: 111 mmol/L (ref 98–111)
Creatinine, Ser: 2.22 mg/dL — ABNORMAL HIGH (ref 0.44–1.00)
GFR, Estimated: 22 mL/min — ABNORMAL LOW (ref >60)
Glucose, Bld: 108 mg/dL — ABNORMAL HIGH (ref 70–99)
Potassium: 3.9 mmol/L (ref 3.5–5.1)
Sodium: 139 mmol/L (ref 135–145)

## 2021-05-29 LAB — GLUCOSE, CAPILLARY
Glucose-Capillary: 96 mg/dL (ref 70–99)
Glucose-Capillary: 118 mg/dL — ABNORMAL HIGH (ref 70–99)
Glucose-Capillary: 104 mg/dL — ABNORMAL HIGH (ref 70–99)
Glucose-Capillary: 124 mg/dL — ABNORMAL HIGH (ref 70–99)

## 2021-05-29 LAB — MAGNESIUM: Magnesium: 1.7 mg/dL (ref 1.7–2.4)

## 2021-05-29 LAB — HEMOGLOBIN AND HEMATOCRIT, BLOOD
HCT: 26.8 % — ABNORMAL LOW (ref 36.0–46.0)
Hemoglobin: 8.6 g/dL — ABNORMAL LOW (ref 12.0–15.0)

## 2021-05-29 MED ORDER — ONDANSETRON HCL 4 MG/2ML IJ SOLN: 4.0000 mg | Freq: Four times a day (QID) | INTRAMUSCULAR | Status: DC | PRN

## 2021-05-29 NOTE — Progress Notes (Signed)
He will PROGRESS NOTE    Erica Whitney  ZOX:[id number] DOB: 1941/08/26 DOA: 05/25/2021 PCP: Fleet Contras, MD   Brief Narrative:  80 year old female with history of breast cancer in remission, uterine cancer 30 years ago, HTN, peptic ulcer disease, DM2, CKD stage IIIb, insomnia, recent hospitalization 05/13/2021 for oozing duodenal ulcer and acute blood loss comes back to the ER with concerns of possible elevated blood glucose but was found to have hemoglobin of 4.2.  She was started PRBC transfusion, IV fluids and GI team was consulted.  EGD showed normal esophagus, stomach, nonbleeding duodenal ulcer with visible vessel treated with cautery and clips placed.   Assessment & Plan:   Principal Problem:   Acute blood loss anemia Active Problems:   HTN (hypertension)   PUD (peptic ulcer disease)   DM2 (diabetes mellitus, type 2) (HCC)   Duodenal ulcer with hemorrhage    Acute blood loss anemia, admission hemoglobin 4.2 Recent bleeding from duodenal ulcer History of peptic ulcer disease - PPI IV twice daily.  Full liquid diet. -Status post 4 units PRBC transfusion. Order One dose IV iron then daily PO with bowel regimen.  -Hemoglobin today 7.8 -GI following.  Dr. Elnoria Howard. -EGD 6/9= normal esophagus, stomach, nonbleeding duodenal ulcer with visible vessel treated with cautery and clips placed.  Essential hypertension - On Norvasc and hydralazine - IV hydralazine as needed   Diabetes mellitus type 2 -Recently metformin was stopped due to well-controlled blood glucose and worsening renal function - Insulin sliding scale and Accu-Chek   Acute kidney injury on CKD stage IIIb, Cr 2.22 -Baseline creatinine 2.2, admission creatinine 2.8.  Gentle hydration.  Closely monitor. Improving.    Depression On zoloft. Continue when tolerating PO   History of breast cancer and uterine cancer Followed outpatient by oncology. In remission.   Peripheral neuropathy - Resume gabapentin when  appropriate    DVT prophylaxis: SCDs Start: 05/25/21 2114 Code Status: Full code Family Communication:  Tawana Updated   Status is: Inpatient  Remains inpatient appropriate because:Inpatient level of care appropriate due to severity of illness  Dispo: The patient is from: Home              Anticipated d/c is to: Home              Patient currently is not medically stable to d/c.    Home when cleared by GI   Difficult to place patient No  Subjective: Doing ok no complaints.    Examination: Constitutional: Not in acute distress Respiratory: Clear to auscultation bilaterally Cardiovascular: Normal sinus rhythm, no rubs Abdomen: Nontender nondistended good bowel sounds Musculoskeletal: No edema noted Skin: No rashes seen Neurologic: CN 2-12 grossly intact.  And nonfocal Psychiatric: Normal judgment and insight. Alert and oriented x 3. Normal mood.   Objective: Vitals:   05/28/21 1428 05/28/21 1701 05/28/21 2119 05/29/21 0535  BP: (!) 170/68 (!) 174/67 (!) 161/75 (!) 159/62  Pulse: 63  65 69  Resp: 17  19 20   Temp: 98.1 F (36.7 C)  99.1 F (37.3 C) 98.6 F (37 C)  TempSrc:   Oral Oral  SpO2: 98%  96% 96%  Weight:      Height:       No intake or output data in the 24 hours ending 05/29/21 0731  Filed Weights   05/25/21 1917 05/26/21 1345  Weight: 77.1 kg 77.1 kg     Data Reviewed:   CBC: Recent Labs  Lab 05/25/21 1356 05/26/21 0101 05/26/21  1025 05/26/21 1833 05/27/21 0057 05/27/21 0932 05/28/21 0407 05/29/21 0317  WBC 6.8 6.4  --   --  5.5  --  7.2 5.9  NEUTROABS 5.1  --   --   --   --   --   --   --   HGB 4.2* 6.0*   < > 8.0* 6.6* 8.0* 8.2* 7.8*  HCT 14.0* 18.6*   < > 25.2* 20.7* 25.3* 25.4* 24.5*  MCV 100.7* 91.6  --   --  91.6  --  90.1 91.4  PLT 217 210  --   --  182  --  191 194   < > = values in this interval not displayed.   Basic Metabolic Panel: Recent Labs  Lab 05/25/21 1356 05/26/21 0101 05/27/21 0057 05/28/21 0407  05/29/21 0317  NA 137 142 141 142 139  K 4.3 4.2 4.5 4.1 3.9  CL 109 112* 112* 111 111  CO2 21* 21* 20* 22 23  GLUCOSE 262* 141* 138* 113* 108*  BUN 80* 74* 60* 48* 47*  CREATININE 2.89* 2.77* 2.39* 2.35* 2.22*  CALCIUM 8.7* 8.7* 8.2* 8.7* 8.6*  MG  --  1.9 1.8 1.9 1.7  PHOS  --  5.0*  --   --   --    GFR: Estimated Creatinine Clearance: 20.5 mL/min (A) (by C-G formula based on SCr of 2.22 mg/dL (H)). Liver Function Tests: Recent Labs  Lab 05/25/21 1356 05/26/21 0101  AST 30 22  ALT 30 23  ALKPHOS 57 51  BILITOT 0.4 0.3  PROT 5.8* 5.4*  ALBUMIN 3.1* 2.8*   No results for input(s): LIPASE, AMYLASE in the last 168 hours. No results for input(s): AMMONIA in the last 168 hours. Coagulation Profile: No results for input(s): INR, PROTIME in the last 168 hours. Cardiac Enzymes: No results for input(s): CKTOTAL, CKMB, CKMBINDEX, TROPONINI in the last 168 hours. BNP (last 3 results) No results for input(s): PROBNP in the last 8760 hours. HbA1C: No results for input(s): HGBA1C in the last 72 hours. CBG: Recent Labs  Lab 05/28/21 0750 05/28/21 1146 05/28/21 1627 05/28/21 1955 05/29/21 0725  GLUCAP 95 130* 88 121* 96   Lipid Profile: No results for input(s): CHOL, HDL, LDLCALC, TRIG, CHOLHDL, LDLDIRECT in the last 72 hours. Thyroid Function Tests: No results for input(s): TSH, T4TOTAL, FREET4, T3FREE, THYROIDAB in the last 72 hours. Anemia Panel: No results for input(s): VITAMINB12, FOLATE, FERRITIN, TIBC, IRON, RETICCTPCT in the last 72 hours.  Sepsis Labs: No results for input(s): PROCALCITON, LATICACIDVEN in the last 168 hours.  Recent Results (from the past 240 hour(s))  SARS CORONAVIRUS 2 (TAT 6-24 HRS) Nasopharyngeal Nasopharyngeal Swab     Status: None   Collection Time: 05/25/21  4:56 PM   Specimen: Nasopharyngeal Swab  Result Value Ref Range Status   SARS Coronavirus 2 NEGATIVE NEGATIVE Final    Comment: (NOTE) SARS-CoV-2 target nucleic acids are NOT  DETECTED.  The SARS-CoV-2 RNA is generally detectable in upper and lower respiratory specimens during the acute phase of infection. Negative results do not preclude SARS-CoV-2 infection, do not rule out co-infections with other pathogens, and should not be used as the sole basis for treatment or other patient management decisions. Negative results must be combined with clinical observations, patient history, and epidemiological information. The expected result is Negative.  Fact Sheet for Patients: HairSlick.no  Fact Sheet for Healthcare Providers: quierodirigir.com  This test is not yet approved or cleared by the [place] FDA and  has been authorized for detection and/or diagnosis of SARS-CoV-2 by FDA under an Emergency Use Authorization (EUA). This EUA will remain  in effect (meaning this test can be used) for the duration of the COVID-19 declaration under Se ction 564(b)(1) of the Act, 21 U.S.C. section 360bbb-3(b)(1), unless the authorization is terminated or revoked sooner.  Performed at [facility], [address], [place], [place] [address]          Radiology Studies: No results found.      Scheduled Meds:  amLODipine  10 mg Oral Daily   atorvastatin  40 mg Oral Daily   ferrous sulfate  325 mg Oral Q breakfast   gabapentin  100 mg Oral BID   hydrALAZINE  50 mg Oral TID   insulin aspart  0-6 Units Subcutaneous TID WC   mirtazapine  30 mg Oral QHS   pantoprazole (PROTONIX) IV  40 mg Intravenous Q12H   senna-docusate  1 tablet Oral QHS   sertraline  100 mg Oral Daily   sodium chloride flush  3 mL Intravenous Q12H   Continuous Infusions:  sodium chloride     sodium chloride     sodium chloride       LOS: 4 days   Time spent= 35 mins    Jazzy Parmer Joline Maxcy, MD [facility]  If 7PM-7AM, please contact night-coverage  05/29/2021, 7:31 AM

## 2021-05-29 NOTE — Evaluation (Signed)
Occupational Therapy Evaluation Patient Details Name: Erica Whitney MRN: [id number] DOB: 12-Mar-1941 Today's Date: 05/29/2021    History of Present Illness 80 y.o. female presented 05/25/21 to ER because her daughter was worried about elevated sugars and grogginess. hgb 4.2, acute on chronic kidney dz; 6/9 upper endoscopy with duodenal ulcer clipped  PMH significant of breast cancer in remission, uterine cancer x 30 years ago, HTN, PUD, DM type 2, neuropathy, depression, CKD-3B, insomnia and recent hospitalization 05/13/2021 for oozing duodenal ulcer on EGD   Clinical Impression   PTA, pt was living at home alone with assistance from her daughter for bathing/shower transfers and IADL (medication, laundry, cooking etc), pt reports she was independent with ADL and modified independent with functional mobility with intermittent use of cane (primarily for community mobility). Pt currently requires minguard for functional mobility without AD, pt demonstrated preference for UE support on furniture. Pt able to complete LB ADL at minguard level and UB ADL at setup level while sitting. Due to decline in current level of function, pt would benefit from acute OT to address established goals to facilitate safe D/C to venue listed below. At this time, recommend HHOT follow-up. Will continue to follow acutely.     Follow Up Recommendations  Home health OT;Supervision - Intermittent    Equipment Recommendations  None recommended by OT    Recommendations for Other Services       Precautions / Restrictions Precautions Precautions: Fall Precaution Comments: has had multiple falls (?most recent was months ago--pt unsure) Restrictions Weight Bearing Restrictions: No      Mobility Bed Mobility               General bed mobility comments: sitting in recliner upon arrival    Transfers Overall transfer level: Needs assistance Equipment used: None Transfers: Sit to/from Stand Sit to Stand: Min  guard         General transfer comment: increased effort to stand from recliner, minguard for safety, no physical assistance provided    Balance Overall balance assessment: Needs assistance Sitting-balance support: Feet supported Sitting balance-Leahy Scale: Good     Standing balance support: No upper extremity supported;Single extremity supported Standing balance-Leahy Scale: Fair Standing balance comment: static standing with supervision with UE support, pt required minguard for more dynamic activitites                           ADL either performed or assessed with clinical judgement   ADL Overall ADL's : Needs assistance/impaired Eating/Feeding: Independent   Grooming: Min guard;Standing   Upper Body Bathing: Set up;Sitting   Lower Body Bathing: Min guard;Sit to/from stand   Upper Body Dressing : Set up;Sitting   Lower Body Dressing: Min guard;Sit to/from stand   Toilet Transfer: Min guard;Ambulation Toilet Transfer Details (indicate cue type and reason): instability noted, pt continued demonstration of preference for furniture walking Toileting- Clothing Manipulation and Hygiene: Min guard;Sit to/from stand       Functional mobility during ADLs: Min guard General ADL Comments: pt limited by instability, fall risk with hx of falls, some safety awareness limitations as well     Vision Baseline Vision/History: No visual deficits Patient Visual Report: No change from baseline       Perception     Praxis      Pertinent Vitals/Pain Pain Assessment: No/denies pain     Hand Dominance Right   Extremity/Trunk Assessment Upper Extremity Assessment Upper Extremity Assessment: Overall WFL for  tasks assessed   Lower Extremity Assessment Lower Extremity Assessment: Generalized weakness   Cervical / Trunk Assessment Cervical / Trunk Assessment: Kyphotic   Communication Communication Communication: No difficulties   Cognition Arousal/Alertness:  Awake/alert Behavior During Therapy: WFL for tasks assessed/performed Overall Cognitive Status: Within Functional Limits for tasks assessed                                 General Comments: oriented x4 Pt completed the Short Blessed Test to evaluate concentration and memory. The SBT is sensitive to early cognitive changes associated with Alzheimer's disease. Scores in the impaired range indicate need for further assessment. Scores of 0-4 indicate normal cognition, 5-9 indicate questionable impairment, 10 or more indicates impairment consistent with dementia. Pt scored 6/28.     General Comments  vss    Exercises     Shoulder Instructions      Home Living Family/patient expects to be discharged to:: Private residence Living Arrangements: Alone Available Help at Discharge: Family;Available PRN/intermittently Type of Home: Apartment Home Access: Level entry     Home Layout: One level     Bathroom Shower/Tub: Teacher, early years/pre: Handicapped height Bathroom Accessibility: Yes   Home Equipment: Environmental consultant - 2 wheels;Cane - single point;Shower seat;Bedside commode          Prior Functioning/Environment Level of Independence: Needs assistance  Gait / Transfers Assistance Needed: furniture walks inside; has "fallen alot of times, but last one was (months ago)" uses cane when goes out (states she should use her RW, but it's too big and she doesn't like it); can't walk through the grocery store with daughter anymore ADL's / Homemaking Assistance Needed: sits on chair in bathtub (steps over and then sits); daughter sometimes helps; dresses independently            OT Problem List: Decreased strength;Impaired balance (sitting and/or standing);Decreased activity tolerance      OT Treatment/Interventions: Self-care/ADL training;Therapeutic exercise;Therapeutic activities;Patient/family education;Balance training;DME and/or AE instruction    OT  Goals(Current goals can be found in the care plan section) Acute Rehab OT Goals Patient Stated Goal: to go home OT Goal Formulation: With patient Time For Goal Achievement: 06/12/21 Potential to Achieve Goals: Good ADL Goals Pt Will Perform Grooming: with modified independence;standing Pt Will Perform Lower Body Dressing: with modified independence;sit to/from stand Pt Will Transfer to Toilet: with modified independence;ambulating Additional ADL Goal #1: Pt will demonstrate independence with 3 fall prevention strategies for safe engagement in ADL/IADL and functional mobility.  OT Frequency: Min 2X/week   Barriers to D/C:            Co-evaluation              AM-PAC OT "6 Clicks" Daily Activity     Outcome Measure Help from another person eating meals?: None Help from another person taking care of personal grooming?: A Little Help from another person toileting, which includes using toliet, bedpan, or urinal?: A Little Help from another person bathing (including washing, rinsing, drying)?: A Little Help from another person to put on and taking off regular upper body clothing?: A Little Help from another person to put on and taking off regular lower body clothing?: A Little 6 Click Score: 19   End of Session Nurse Communication: Mobility status  Activity Tolerance: Patient tolerated treatment well Patient left: in chair;with call bell/phone within reach;with chair alarm set  OT Visit Diagnosis: Unsteadiness  on feet (R26.81)                Time: 4232-0094 OT Time Calculation (min): 23 min Charges:  OT General Charges $OT Visit: 1 Visit OT Evaluation $OT Eval Moderate Complexity: 1 Mod OT Treatments $Self Care/Home Management : 8-22 mins  Helene Kelp OTR/L Acute Rehabilitation Services Office: [phone]   Wyn Forster 05/29/2021, 3:27 PM

## 2021-05-29 NOTE — Progress Notes (Addendum)
Patient ID: Erica Whitney, female   DOB: Dec 16, 1941, 80 y.o.   MRN: [id number]   Progress Note Covering for Drs. [place]   Subjective  Day # 3 CC: anemia , GI bleed  EGD 05/26/2021-duodenal ulcer with visible vessel, injected and treated with bipolar then endoclipped  Hemoglobin 7.8 down from 8.2 yesterday-status post 4 units total Has received IV iron x1 today   Sitting up in chair, complaining of nausea and feeling like she needs to vomit, new complaint Unaware of any melena today, has not vomited.    Objective   Vital signs in last 24 hours: Temp:  [98.1 F (36.7 C)-99.1 F (37.3 C)] 98.8 F (37.1 C) (06/12 1029) Pulse Rate:  [63-69] 66 (06/12 1029) Resp:  [16-20] 16 (06/12 1029) BP: (156-177)/(62-80) 156/72 (06/12 1029) SpO2:  [96 %-100 %] 100 % (06/12 1029)   General:    Elderly African-American female in NAD, uncomfortable appearing Heart:  Regular rate and rhythm; no murmurs Lungs: Respirations even and unlabored, lungs CTA bilaterally Abdomen:  Soft, mildly tender in the epigastrium, no guarding nondistended. Normal bowel sounds. Extremities:  Without edema. Neurologic:  Alert and oriented,  grossly normal neurologically. Psych:  Cooperative. Normal mood and affect.  Intake/Output from previous day: No intake/output data recorded. Intake/Output this shift: No intake/output data recorded.  Lab Results: Recent Labs    05/27/21 0057 05/27/21 0932 05/28/21 0407 05/29/21 0317  WBC 5.5  --  7.2 5.9  HGB 6.6* 8.0* 8.2* 7.8*  HCT 20.7* 25.3* 25.4* 24.5*  PLT 182  --  191 194   BMET Recent Labs    05/27/21 0057 05/28/21 0407 05/29/21 0317  NA 141 142 139  K 4.5 4.1 3.9  CL 112* 111 111  CO2 20* 22 23  GLUCOSE 138* 113* 108*  BUN 60* 48* 47*  CREATININE 2.39* 2.35* 2.22*  CALCIUM 8.2* 8.7* 8.6*      Assessment / Plan:     #46 80 year old female with recent admission for oozing duodenal ulcer who is readmitted now with hemoglobin 4.2 on  05/26/2021.  Repeat EGD with duodenal ulcer with visible vessel which was treated with injection, BiCAP and endoclips on 05/27/2021  No active hemorrhage since though hemoglobin has drifted a little  She continues on IV PPI twice daily  Complaining of nausea today, no emesis, mild abdominal discomfort  Plan: Follow hemoglobin carefully Continue IV PPI twice daily Start IV Zofran 4 mg every 6 hours as needed for nausea Back to clears today Dr. Benson Norway will resume GI care in a.m.    LOS: 4 days   Amy Esterwood PA-C 05/29/2021, 11:11 AM     Attending Physician Note   I have taken an interval history, reviewed the chart and examined the patient. I agree with the Advanced Practitioner's note, impression and recommendations.   Duodenal ulcer with bleed, ABL anemia. Complaining of nausea and feels like she might vomit today. No further melena reported. Hgb = 7.8. Continue pantoprazole IV bid. Back to clear liquids today. Trend CBC and symptoms closely. Drs. Mann & Benson Norway will resume GI care on Monday.   Lucio Edward, MD FACG [phone]

## 2021-05-29 NOTE — Evaluation (Signed)
Physical Therapy Evaluation Patient Details Name: Erica Whitney MRN: [id number] DOB: 03-08-1941 Today's Date: 05/29/2021   History of Present Illness  80 y.o. female presented 05/25/21 to ER because her daughter was worried about elevated sugars and grogginess. hgb 4.2, acute on chronic kidney dz; 6/9 upper endoscopy with duodenal ulcer clipped  PMH significant of breast cancer in remission, uterine cancer x 30 years ago, HTN, PUD, DM type 2, neuropathy, depression, CKD-3B, insomnia and recent hospitalization 05/13/2021 for oozing duodenal ulcer on EGD  Clinical Impression   Pt admitted secondary to problem above with deficits below. PTA patient was living alone and "furniture walking" because she does not like to use her cane or RW. She endorses h/o falls, and reports last one was on back patio months ago. Pt currently requires minguard to hand-held assist with walking without a device. Anticipate she will be supervision to modified independent with her cane. Her daughter checks on and assists her regularly.  Will continue to follow acutely to maximize functional mobility independence and safety.       Follow Up Recommendations Home health PT;Supervision - Intermittent (HHPT For home safety evaluation and DME education)    Equipment Recommendations  None recommended by PT    Recommendations for Other Services OT consult     Precautions / Restrictions Precautions Precautions: Fall Precaution Comments: has had multiple falls (?most recent was months ago--pt unsure)      Mobility  Bed Mobility               General bed mobility comments: sitting EOB on arrival    Transfers Overall transfer level: Needs assistance Equipment used: None Transfers: Sit to/from Stand Sit to Stand: Min guard         General transfer comment: incr effort and time from EOB and standard toilet; minguard for safety but no physical assist needed  Ambulation/Gait Ambulation/Gait assistance:  Min assist Gait Distance (Feet): 12 Feet (x2) Assistive device: 1 person hand held assist Gait Pattern/deviations: Step-through pattern;Decreased stride length;Wide base of support Gait velocity: decreased Gait velocity interpretation: <1.8 ft/sec, indicate of risk for recurrent falls General Gait Details: pt initially "furniture walking" and agreed to [facility] as table she wanted to hold is on wheels  Stairs            Wheelchair Mobility    Modified Rankin (Stroke Patients Only)       Balance Overall balance assessment: Needs assistance Sitting-balance support: Feet supported Sitting balance-Leahy Scale: Good     Standing balance support: No upper extremity supported;Single extremity supported Standing balance-Leahy Scale: Fair Standing balance comment: sttic standing with supervision; pt bends way forward to do her pericare as coming up from toilet--completed with supervision without need for assist                             Pertinent Vitals/Pain Pain Assessment: No/denies pain    Home Living Family/patient expects to be discharged to:: Private residence Living Arrangements: Alone Available Help at Discharge: Family;Available PRN/intermittently Type of Home: Apartment Home Access: Level entry     Home Layout: One level Home Equipment: Walker - 2 wheels;Cane - single point;Shower seat;Bedside commode      Prior Function Level of Independence: Needs assistance   Gait / Transfers Assistance Needed: furniture walks inside; has "fallen alot of times, but last one was (months ago)" uses cane when goes out (states she should use her RW, but it's too  big and she doesn't like it); can't walk through the grocery store with daughter anymore  ADL's / Homemaking Assistance Needed: sits on chair in bathtub (steps over and then sits); daughter sometimes helps; dresses independently        Hand Dominance   Dominant Hand: Right    Extremity/Trunk Assessment    Upper Extremity Assessment Upper Extremity Assessment: Defer to OT evaluation    Lower Extremity Assessment Lower Extremity Assessment: Generalized weakness (able to stand from low toilet with incr effort)    Cervical / Trunk Assessment Cervical / Trunk Assessment: Kyphotic  Communication   Communication: No difficulties  Cognition Arousal/Alertness: Awake/alert Behavior During Therapy: WFL for tasks assessed/performed Overall Cognitive Status: Within Functional Limits for tasks assessed                                 General Comments: not specifically challenged; oriented to self, place, day of week, situation      General Comments      Exercises     Assessment/Plan    PT Assessment Patient needs continued PT services  PT Problem List Decreased strength;Decreased range of motion;Decreased balance;Decreased activity tolerance;Decreased mobility;Decreased safety awareness;Decreased knowledge of use of DME       PT Treatment Interventions DME instruction;Gait training;Functional mobility training;Therapeutic activities;Therapeutic exercise;Balance training;Patient/family education    PT Goals (Current goals can be found in the Care Plan section)  Acute Rehab PT Goals Patient Stated Goal: to get stronger and back to normal PT Goal Formulation: With patient Time For Goal Achievement: 06/12/21 Potential to Achieve Goals: Good    Frequency Min 3X/week   Barriers to discharge Decreased caregiver support daughter works and cannot provide 24/7 support    Co-evaluation               AM-PAC PT "6 Clicks" Mobility  Outcome Measure Help needed turning from your back to your side while in a flat bed without using bedrails?: None Help needed moving from lying on your back to sitting on the side of a flat bed without using bedrails?: None Help needed moving to and from a bed to a chair (including a wheelchair)?: A Little Help needed standing up from a chair  using your arms (e.g., wheelchair or bedside chair)?: A Little Help needed to walk in hospital room?: A Little Help needed climbing 3-5 steps with a railing? : A Little 6 Click Score: 20    End of Session   Activity Tolerance: Patient tolerated treatment well Patient left: in chair;with call bell/phone within reach;with chair alarm set Nurse Communication: Mobility status;Other (comment) (placed on chair alarm and told to call) PT Visit Diagnosis: Unsteadiness on feet (R26.81);Other abnormalities of gait and mobility (R26.89);Muscle weakness (generalized) (M62.81);History of falling (Z91.81)    Time: 6213-0865 PT Time Calculation (min) (ACUTE ONLY): 27 min   Charges:   PT Evaluation $PT Eval Low Complexity: 1 Low PT Treatments $Therapeutic Activity: 8-22 mins         Erica Whitney, PT Pager [phone]   Rexanne Mano 05/29/2021, 9:53 AM

## 2021-05-30 LAB — CBC
HCT: 25.9 % — ABNORMAL LOW (ref 36.0–46.0)
Hemoglobin: 8.2 g/dL — ABNORMAL LOW (ref 12.0–15.0)
MCH: 28.8 pg (ref 26.0–34.0)
MCHC: 31.7 g/dL (ref 30.0–36.0)
MCV: 90.9 fL (ref 80.0–100.0)
Platelets: 199 10*3/uL (ref 150–400)
RBC: 2.85 MIL/uL — ABNORMAL LOW (ref 3.87–5.11)
RDW: 15.6 % — ABNORMAL HIGH (ref 11.5–15.5)
WBC: 6.6 10*3/uL (ref 4.0–10.5)
nRBC: 0.5 % — ABNORMAL HIGH (ref 0.0–0.2)

## 2021-05-30 LAB — BASIC METABOLIC PANEL
Anion gap: 6 (ref 5–15)
BUN: 40 mg/dL — ABNORMAL HIGH (ref 8–23)
CO2: 23 mmol/L (ref 22–32)
Calcium: 8.6 mg/dL — ABNORMAL LOW (ref 8.9–10.3)
Chloride: 112 mmol/L — ABNORMAL HIGH (ref 98–111)
Creatinine, Ser: 2.26 mg/dL — ABNORMAL HIGH (ref 0.44–1.00)
GFR, Estimated: 22 mL/min — ABNORMAL LOW (ref >60)
Glucose, Bld: 111 mg/dL — ABNORMAL HIGH (ref 70–99)
Potassium: 3.7 mmol/L (ref 3.5–5.1)
Sodium: 141 mmol/L (ref 135–145)

## 2021-05-30 LAB — GLUCOSE, CAPILLARY
Glucose-Capillary: 138 mg/dL — ABNORMAL HIGH (ref 70–99)
Glucose-Capillary: 122 mg/dL — ABNORMAL HIGH (ref 70–99)

## 2021-05-30 LAB — MAGNESIUM: Magnesium: 1.7 mg/dL (ref 1.7–2.4)

## 2021-05-30 MED ORDER — FERROUS SULFATE 325 (65 FE) MG PO TABS: 325.0000 mg | ORAL_TABLET | Freq: Every day | ORAL | 1 refills | Status: AC

## 2021-05-30 MED ORDER — SENNOSIDES-DOCUSATE SODIUM 8.6-50 MG PO TABS: 1.0000 | ORAL_TABLET | Freq: Every evening | ORAL | 0 refills | Status: AC | PRN

## 2021-05-30 NOTE — Discharge Summary (Signed)
Physician Discharge Summary  Erica Whitney OLM:[id number] DOB: [birth year] DOA: 05/25/2021  PCP: Nolene Ebbs, MD  Admit date: 05/25/2021 Discharge date: 05/30/2021  Admitted From: Home Disposition: Home  Recommendations for Outpatient Follow-up:  Follow up with PCP in 1-2 weeks Please obtain BMP/CBC in one week your next doctors visit.  PPI twice daily before meals and then transition to daily at the end of the month Follow-up patient GI, Dr. Benson Norway Iron supplements with bowel regimen   Discharge Condition: Stable CODE STATUS: Full code Diet recommendation: 2 g salt  Brief/Interim Summary: 80 year old female with history of breast cancer in remission, uterine cancer 30 years ago, HTN, peptic ulcer disease, DM2, CKD stage IIIb, insomnia, recent hospitalization 05/13/2021 for oozing duodenal ulcer and acute blood loss comes back to the ER with concerns of possible elevated blood glucose but was found to have hemoglobin of 4.2.  She was started PRBC transfusion, IV fluids and GI team was consulted.  EGD showed normal esophagus, stomach, nonbleeding duodenal ulcer with visible vessel treated with cautery and clips placed.  After the procedure patient's hemoglobin remained stable and on the day of discharge it was 8.2.  She was tolerating oral diet without any issues.  Follow-up outpatient with PCP and GI. Spoke with Dr. Collene Mares on the day of discharge, cleared for DC.  Met with patient's daughter at bedside as well at the time of discharge.  All the questions answered by me.     Assessment & Plan:   Principal Problem:   Acute blood loss anemia Active Problems:   HTN (hypertension)   PUD (peptic ulcer disease)   DM2 (diabetes mellitus, type 2) (HCC)   Duodenal ulcer with hemorrhage       Acute blood loss anemia, admission hemoglobin 4.2 Recent bleeding from duodenal ulcer History of peptic ulcer disease - Status post 4 units PRBC and received 1 dose IV iron.  Now transition to oral  iron with bowel regimen - Hemoglobin remained stable at 8.2.  Tolerating oral diet without any issue.  Spoke with Dr. Collene Mares on the day of discharge, cleared for DC. -Check outpatient CBC in 1 week -EGD 6/9= normal esophagus, stomach, nonbleeding duodenal ulcer with visible vessel treated with cautery and clips placed.   Essential hypertension - On Norvasc and hydralazine   Diabetes mellitus type 2 - Well diet controlled.  Apparently was taken off metformin   Acute kidney injury on CKD stage IIIb, Cr 2.22.  Resolved -Baseline creatinine 2.2   Depression On zoloft. Continue when tolerating PO   History of breast cancer and uterine cancer Followed outpatient by oncology. In remission.    Peripheral neuropathy - Resume gabapentin when appropriate      Body mass index is 30.11 kg/m.         Discharge Diagnoses:  Principal Problem:   Acute blood loss anemia Active Problems:   HTN (hypertension)   PUD (peptic ulcer disease)   DM2 (diabetes mellitus, type 2) (HCC)   Duodenal ulcer with hemorrhage      Consultations: GI  Subjective: Feels great no complaints.  Tolerating orals.  Discharge Exam: Vitals:   05/30/21 0614 05/30/21 1121  BP: (!) 148/70 (!) 176/72  Pulse: 70 69  Resp: 20 16  Temp: 98.3 F (36.8 C) 98.2 F (36.8 C)  SpO2:  100%   Vitals:   05/29/21 1729 05/29/21 2106 05/30/21 0614 05/30/21 1121  BP: (!) 166/76 (!) 161/77 (!) 148/70 (!) 176/72  Pulse:  66 70 69  Resp:  _0 Temp:  98.2 F (36.8 C) 98.3 F (36.8 C) 98.2 F (36.8 C)  TempSrc:   Oral Oral  SpO2:  99%  100%  Weight:      Height:        General: Pt is alert, awake, not in acute distress Cardiovascular: RRR, S1/S2 +, no rubs, no gallops Respiratory: CTA bilaterally, no wheezing, no rhonchi Abdominal: Soft, NT, ND, bowel sounds + Extremities: no edema, no cyanosis  Discharge Instructions   Allergies as of 05/30/2021   No Known Allergies      Medication List      TAKE these medications    Accu-Chek Guide Control Liqd   Accu-Chek Guide test strip Generic drug: glucose blood qd   AIMSCO INSULIN SYR ULTRA THIN 31G X 5/16" 0.3 ML Misc Generic drug: Insulin Syringe-Needle U-100   amLODipine 10 MG tablet Commonly known as: NORVASC Take 1 tablet (10 mg total) by mouth daily.   atorvastatin 40 MG tablet Commonly known as: LIPITOR Take 40 mg by mouth daily.   Calcium Carbonate-Vitamin D 600-400 MG-UNIT tablet Take 1 tablet by mouth daily.   cetirizine 10 MG tablet Commonly known as: ZYRTEC Take 10 mg by mouth daily as needed for allergies.   cholecalciferol 25 MCG (1000 UNIT) tablet Commonly known as: VITAMIN D3 Take 1,000 Units by mouth daily.   cycloSPORINE 0.05 % ophthalmic emulsion Commonly known as: RESTASIS Place 1 drop into both eyes 2 (two) times daily.   diclofenac Sodium 1 % Gel Commonly known as: VOLTAREN Apply 4 g topically 4 (four) times daily as needed (knee/leg pain).   ferrous sulfate 325 (65 FE) MG tablet Take 1 tablet (325 mg total) by mouth daily with breakfast. Start taking on: May 31, 2021   furosemide 40 MG tablet Commonly known as: LASIX Take 1 tablet (40 mg total) by mouth daily as needed for fluid (ankle swelling).   gabapentin 100 MG capsule Commonly known as: NEURONTIN Take 100 mg by mouth 2 (two) times daily.   hydrALAZINE 50 MG tablet Commonly known as: APRESOLINE Take 1 tablet (50 mg total) by mouth 3 (three) times daily.   mirtazapine 30 MG tablet Commonly known as: REMERON Take 30 mg by mouth at bedtime.   pantoprazole 40 MG tablet Commonly known as: PROTONIX Take 1 tablet (40 mg total) by mouth 2 (two) times daily before a meal for 30 days, THEN 1 tablet (40 mg total) daily. Start taking on: May 16, 2021   PRENATAL PO Take 1 tablet by mouth daily.   senna-docusate 8.6-50 MG tablet Commonly known as: Senokot-S Take 1 tablet by mouth at bedtime as needed for mild constipation or  moderate constipation.   sertraline 100 MG tablet Commonly known as: ZOLOFT Take 100 mg by mouth every morning.   triamcinolone cream 0.1 % Commonly known as: KENALOG Apply 1 application topically 2 (two) times daily as needed (rash).   vitamin C 500 MG tablet Commonly known as: ASCORBIC ACID Take 500 mg by mouth daily.        Follow-up Information     [facility] Follow up.   Specialty: [place] Why: [facility] will contact you to schedule your first home visit. Contact information: [address] [place] [address] [phone]         Nolene Ebbs, MD. Call in 1 week(s).   Specialty: Internal Medicine Contact information: [address] [place] [address] [phone]  No Known Allergies  You were cared for by a hospitalist during your hospital stay. If you have any questions about your discharge medications or the care you received while you were in the hospital after you are discharged, you can call the unit and asked to speak with the hospitalist on call if the hospitalist that took care of you is not available. Once you are discharged, your primary care physician will handle any further medical issues. Please note that no refills for any discharge medications will be authorized once you are discharged, as it is imperative that you return to your primary care physician (or establish a relationship with a primary care physician if you do not have one) for your aftercare needs so that they can reassess your need for medications and monitor your lab values.   Procedures/Studies: [place] EKG SITE RITE  Result Date: 05/14/2021 If Site Rite image not attached, placement could not be confirmed due to current cardiac rhythm.    The results of significant diagnostics from this hospitalization (including imaging, microbiology, ancillary and laboratory) are listed below for reference.     Microbiology: Recent  Results (from the past 240 hour(s))  SARS CORONAVIRUS 2 (TAT 6-24 HRS) Nasopharyngeal Nasopharyngeal Swab     Status: None   Collection Time: 05/25/21  4:56 PM   Specimen: Nasopharyngeal Swab  Result Value Ref Range Status   SARS Coronavirus 2 NEGATIVE NEGATIVE Final    Comment: (NOTE) SARS-CoV-2 target nucleic acids are NOT DETECTED.  The SARS-CoV-2 RNA is generally detectable in upper and lower respiratory specimens during the acute phase of infection. Negative results do not preclude SARS-CoV-2 infection, do not rule out co-infections with other pathogens, and should not be used as the sole basis for treatment or other patient management decisions. Negative results must be combined with clinical observations, patient history, and epidemiological information. The expected result is Negative.  Fact Sheet for Patients: SugarRoll.be  Fact Sheet for Healthcare Providers: https://www.woods-mathews.com/  This test is not yet approved or cleared by the [place] FDA and  has been authorized for detection and/or diagnosis of SARS-CoV-2 by FDA under an Emergency Use Authorization (EUA). This EUA will remain  in effect (meaning this test can be used) for the duration of the COVID-19 declaration under Se ction 564(b)(1) of the Act, 21 U.S.C. section 360bbb-3(b)(1), unless the authorization is terminated or revoked sooner.  Performed at [facility], [place] [address], [place], [place] [address]      Labs: BNP (last 3 results) No results for input(s): BNP in the last 8760 hours. Basic Metabolic Panel: Recent Labs  Lab 05/26/21 0101 05/27/21 0057 05/28/21 0407 05/29/21 0317 05/30/21 0416  NA 142 141 142 139 141  K 4.2 4.5 4.1 3.9 3.7  CL 112* 112* 111 111 112*  CO2 21* 20* _0 GLUCOSE 141* 138* 113* 108* 111*  BUN 74* 60* 48* 47* 40*  CREATININE 2.77* 2.39* 2.35* 2.22* 2.26*  CALCIUM 8.7* 8.2* 8.7* 8.6* 8.6*  MG 1.9 1.8  1.9 1.7 1.7  PHOS 5.0*  --   --   --   --    Liver Function Tests: Recent Labs  Lab 05/25/21 1356 05/26/21 0101  AST 30 22  ALT 30 23  ALKPHOS 57 51  BILITOT 0.4 0.3  PROT 5.8* 5.4*  ALBUMIN 3.1* 2.8*   No results for input(s): LIPASE, AMYLASE in the last 168 hours. No results for input(s): AMMONIA in the last 168 hours. CBC:  Recent Labs  Lab 05/25/21 1356 05/26/21 0101 05/26/21 1025 05/27/21 0057 05/27/21 0932 05/28/21 0407 05/29/21 0317 05/29/21 1442 05/30/21 0416  WBC 6.8 6.4  --  5.5  --  7.2 5.9  --  6.6  NEUTROABS 5.1  --   --   --   --   --   --   --   --   HGB 4.2* 6.0*   < > 6.6* 8.0* 8.2* 7.8* 8.6* 8.2*  HCT 14.0* 18.6*   < > 20.7* 25.3* 25.4* 24.5* 26.8* 25.9*  MCV 100.7* 91.6  --  91.6  --  90.1 91.4  --  90.9  PLT 217 210  --  182  --  191 194  --  199   < > = values in this interval not displayed.   Cardiac Enzymes: No results for input(s): CKTOTAL, CKMB, CKMBINDEX, TROPONINI in the last 168 hours. BNP: Invalid input(s): POCBNP CBG: Recent Labs  Lab 05/29/21 1158 05/29/21 1559 05/29/21 2105 05/30/21 0941 05/30/21 1140  GLUCAP 118* 104* 124* 138* 122*   D-Dimer No results for input(s): DDIMER in the last 72 hours. Hgb A1c No results for input(s): HGBA1C in the last 72 hours. Lipid Profile No results for input(s): CHOL, HDL, LDLCALC, TRIG, CHOLHDL, LDLDIRECT in the last 72 hours. Thyroid function studies No results for input(s): TSH, T4TOTAL, T3FREE, THYROIDAB in the last 72 hours.  Invalid input(s): FREET3 Anemia work up No results for input(s): VITAMINB12, FOLATE, FERRITIN, TIBC, IRON, RETICCTPCT in the last 72 hours. Urinalysis    Component Value Date/Time   COLORURINE STRAW (A) 05/25/2021 2150   APPEARANCEUR CLEAR 05/25/2021 2150   LABSPEC 1.005 05/25/2021 2150   PHURINE 5.0 05/25/2021 2150   GLUCOSEU NEGATIVE 05/25/2021 2150   HGBUR NEGATIVE 05/25/2021 2150   BILIRUBINUR NEGATIVE 05/25/2021 2150   KETONESUR NEGATIVE 05/25/2021  2150   PROTEINUR 30 (A) 05/25/2021 2150   UROBILINOGEN 0.2 01/30/2012 1215   NITRITE NEGATIVE 05/25/2021 2150   LEUKOCYTESUR NEGATIVE 05/25/2021 2150   Sepsis Labs Invalid input(s): PROCALCITONIN,  WBC,  LACTICIDVEN Microbiology Recent Results (from the past 240 hour(s))  SARS CORONAVIRUS 2 (TAT 6-24 HRS) Nasopharyngeal Nasopharyngeal Swab     Status: None   Collection Time: 05/25/21  4:56 PM   Specimen: Nasopharyngeal Swab  Result Value Ref Range Status   SARS Coronavirus 2 NEGATIVE NEGATIVE Final    Comment: (NOTE) SARS-CoV-2 target nucleic acids are NOT DETECTED.  The SARS-CoV-2 RNA is generally detectable in upper and lower respiratory specimens during the acute phase of infection. Negative results do not preclude SARS-CoV-2 infection, do not rule out co-infections with other pathogens, and should not be used as the sole basis for treatment or other patient management decisions. Negative results must be combined with clinical observations, patient history, and epidemiological information. The expected result is Negative.  Fact Sheet for Patients: SugarRoll.be  Fact Sheet for Healthcare Providers: https://www.woods-mathews.com/  This test is not yet approved or cleared by the [place] FDA and  has been authorized for detection and/or diagnosis of SARS-CoV-2 by FDA under an Emergency Use Authorization (EUA). This EUA will remain  in effect (meaning this test can be used) for the duration of the COVID-19 declaration under Se ction 564(b)(1) of the Act, 21 U.S.C. section 360bbb-3(b)(1), unless the authorization is terminated or revoked sooner.  Performed at [facility], [place] [address], [place], [place] [address]      Time coordinating discharge:  I have spent 35 minutes face  to face with the patient and on the ward discussing the patients care, assessment, plan and disposition with other care givers. >50% of the time  was devoted counseling the patient about the risks and benefits of treatment/Discharge disposition and coordinating care.   SIGNED:   Damita Lack, MD  [facility] 05/30/2021, 2:51 PM   If 7PM-7AM, please contact night-coverage

## 2021-05-30 NOTE — Progress Notes (Signed)
Occupational Therapy Treatment Patient Details Name: Erica Whitney MRN: [id number] DOB: 11-03-1941 Today's Date: 05/30/2021    History of present illness 80 y.o. female presented 05/25/21 to ER because her daughter was worried about elevated sugars and grogginess. hgb 4.2, acute on chronic kidney dz; 6/9 upper endoscopy with duodenal ulcer clipped. PMH significant of breast cancer in remission, uterine cancer x 30 years ago, HTN, PUD, DM type 2, neuropathy, depression, CKD-IIIb, insomnia and recent hospitalization 05/13/2021 for oozing duodenal ulcer on EGD   OT comments  Patient making great progress toward goals. Patient/family education provided on safety with ADLs/IADLs, safety with mobility using SPC at all times, and initial supervision A for tub/shower transfers upon return home. Patient and daughter expressed verbal understanding. Patient ambulated up to 189ft in hallway with use of SPC and no overt LOB. Mild DOE noted. Education provided on activity pacing and self-awareness of need for rest breaks. Patient expressed verbal understanding. Patient/daughter hopeful of return home today.    Follow Up Recommendations  Home health OT;Supervision - Intermittent    Equipment Recommendations  None recommended by OT    Recommendations for Other Services      Precautions / Restrictions Precautions Precautions: Fall Precaution Comments: Hx of falls Restrictions Weight Bearing Restrictions: No       Mobility Bed Mobility               General bed mobility comments: Patient seated in recliner upon entry.    Transfers Overall transfer level: Needs assistance Equipment used: None Transfers: Sit to/from Stand Sit to Stand: Supervision;Min guard         General transfer comment: Sit to stand from low recliner with supervision A for safety.    Balance Overall balance assessment: Needs assistance Sitting-balance support: Feet supported Sitting balance-Leahy Scale:  Good     Standing balance support: No upper extremity supported;Single extremity supported Standing balance-Leahy Scale: Fair Standing balance comment: Dynamic balance with supervision A and use of SPC.                           ADL either performed or assessed with clinical judgement   ADL Overall ADL's : Needs assistance/impaired Eating/Feeding: Independent                                     General ADL Comments: Completed functional mobility up to 152ft with SPC and supervision A.     Vision       Perception     Praxis      Cognition Arousal/Alertness: Awake/alert Behavior During Therapy: WFL for tasks assessed/performed Overall Cognitive Status: Within Functional Limits for tasks assessed                                          Exercises     Shoulder Instructions       General Comments VSS on RA. 3/4 DOE with functional mobility up to 180ft. Education provided on energy conservation techniques including need for rest breaks. Patient expressed verbal understanding.    Pertinent Vitals/ Pain       Pain Assessment: No/denies pain  Home Living  Prior Functioning/Environment              Frequency  Min 2X/week        Progress Toward Goals  OT Goals(current goals can now be found in the care plan section)  Progress towards OT goals: Progressing toward goals  Acute Rehab OT Goals Patient Stated Goal: to go home OT Goal Formulation: With patient Time For Goal Achievement: 06/12/21 Potential to Achieve Goals: Good ADL Goals Pt Will Perform Grooming: with modified independence;standing Pt Will Perform Lower Body Dressing: with modified independence;sit to/from stand Pt Will Transfer to Toilet: with modified independence;ambulating Additional ADL Goal #1: Pt will demonstrate independence with 3 fall prevention strategies for safe engagement in  ADL/IADL and functional mobility.  Plan Discharge plan remains appropriate;Frequency remains appropriate    Co-evaluation                 AM-PAC OT "6 Clicks" Daily Activity     Outcome Measure   Help from another person eating meals?: None Help from another person taking care of personal grooming?: A Little Help from another person toileting, which includes using toliet, bedpan, or urinal?: A Little Help from another person bathing (including washing, rinsing, drying)?: A Little Help from another person to put on and taking off regular upper body clothing?: A Little Help from another person to put on and taking off regular lower body clothing?: A Little 6 Click Score: 19    End of Session Equipment Utilized During Treatment: Gait belt;Other (comment) (SPC)  OT Visit Diagnosis: Unsteadiness on feet (R26.81)   Activity Tolerance Patient tolerated treatment well   Patient Left in chair;with call bell/phone within reach;with family/visitor present   Nurse Communication Mobility status        Time: 1217-1232 OT Time Calculation (min): 15 min  Charges: OT General Charges $OT Visit: 1 Visit OT Treatments $Therapeutic Activity: 8-22 mins  Jackob [place]okston H. OTR/L Supplemental OT, Department of rehab services [phone]   Nupur Hohman R H. 05/30/2021, 12:45 PM

## 2021-05-30 NOTE — TOC Initial Note (Signed)
Transition of Care [facility]) - Initial/Assessment Note    Patient Details  Name: Erica Whitney MRN: [id number] Date of Birth: Dec 25, 1940  [facility]) CM/SW Contact:    Joanne Chars, LCSW Phone Number: 05/30/2021, 9:25 AM  Clinical Narrative:   CSW met with pt regarding discharge recommendation for [facility].  Pt agreeable to this, choice document given.  Permission given to speak with daughter Nira Conn.  Pt reports current equipment in home: walker, cane.  Pt reports she is vaccinated for covid and has had booster.  PCP in place.  CSW spoke with daughter Nira Conn regarding [facility] plan and she is also in agreement, will be coming to hospital later today.                  Expected Discharge Plan: [place] Barriers to Discharge: No Barriers Identified   Patient Goals and CMS Choice Patient states their goals for this hospitalization and ongoing recovery are:: "be able to do what I've always done" CMS Medicare.gov Compare Post Acute Care list provided to:: Patient Choice offered to / list presented to : Patient  Expected Discharge Plan and Services Expected Discharge Plan: [place]       Living arrangements for the past 2 months: Apartment Expected Discharge Date: 05/30/21                                    Prior Living Arrangements/Services Living arrangements for the past 2 months: Apartment Lives with:: Self Patient language and need for interpreter reviewed:: Yes Do you feel safe going back to the place where you live?: Yes      Need for Family Participation in Patient Care: No (Comment) Care giver support system in place?: Yes (comment) Current home services: Other (comment) (none) Criminal Activity/Legal Involvement Pertinent to Current Situation/Hospitalization: No - Comment as needed  Activities of Daily Living      Permission Sought/Granted Permission sought to share information with : Family  Supports Permission granted to share information with : Yes, Verbal Permission Granted  Share Information with NAME: daughter Nira Conn  Permission granted to share info w AGENCY: HH        Emotional Assessment Appearance:: Appears stated age Attitude/Demeanor/Rapport: Engaged Affect (typically observed): Appropriate, Pleasant Orientation: : Oriented to Self, Oriented to Place, Oriented to  Time, Oriented to Situation Alcohol / Substance Use: Not Applicable Psych Involvement: No (comment)  Admission diagnosis:  Acute blood loss anemia [D62] Symptomatic anemia [D64.9] Type 2 diabetes mellitus with hyperglycemia, without long-term current use of insulin (HCC) [E11.65] Patient Active Problem List   Diagnosis Date Noted   Duodenal ulcer with hemorrhage    Acute blood loss anemia 05/25/2021   Rectal bleeding 05/14/2021   Bright red blood per rectum 05/13/2021   HTN (hypertension) 05/13/2021   PUD (peptic ulcer disease) 05/13/2021   DM2 (diabetes mellitus, type 2) ([place]) 05/13/2021   Osteopenia 01/13/2015   Uterine cancer ([place])    Breast cancer of upper-outer quadrant of right female breast ([place]) 11/06/2014   PCP:  Nolene Ebbs, MD Pharmacy:   [facility] ([place]), [place] - [address] [place] ([place])  [address] Phone: [phone] Fax: [phone]     Social Determinants of Health (SDOH) Interventions    Readmission Risk Interventions No flowsheet data found.

## 2021-05-30 NOTE — Progress Notes (Signed)
PT Cancellation Note  Patient Details Name: Erica Whitney MRN: [id number] DOB: [birth year]   Cancelled Treatment:    Reason Eval/Treat Not Completed: Other (comment).  Leaving soon and declines PT.  Agrees to let nursing know if she changes her mind.   Ramond Dial 05/30/2021, 2:56 PM  Mee Hives, PT MS Acute Rehab Dept. Number: [place] and [place]

## 2021-06-08 ENCOUNTER — Ambulatory Visit: Payer: Medicare Other | Admitting: Rehabilitation

## 2021-07-11 ENCOUNTER — Ambulatory Visit
Admission: RE | Admit: 2021-07-11 | Discharge: 2021-07-11 | Disposition: A | Discharge status: Home / Self Care | Payer: Medicare Other | Source: Ambulatory Visit | Attending: Hematology | Admitting: Hematology

## 2021-07-11 ENCOUNTER — Other Ambulatory Visit: Payer: Self-pay

## 2021-07-11 DIAGNOSIS — N63 Unspecified lump in unspecified breast: Secondary | ICD-10-CM

## 2021-12-05 ENCOUNTER — Ambulatory Visit
Admission: RE | Admit: 2021-12-05 | Discharge: 2021-12-05 | Disposition: A | Discharge status: Home / Self Care | Payer: Medicare Other | Source: Ambulatory Visit | Attending: Hematology | Admitting: Hematology

## 2021-12-05 DIAGNOSIS — Z1231 Encounter for screening mammogram for malignant neoplasm of breast: Secondary | ICD-10-CM

## 2021-12-07 ENCOUNTER — Other Ambulatory Visit: Payer: Self-pay | Admitting: Hematology

## 2021-12-07 DIAGNOSIS — R928 Other abnormal and inconclusive findings on diagnostic imaging of breast: Secondary | ICD-10-CM

## 2022-01-13 ENCOUNTER — Ambulatory Visit
Admission: RE | Admit: 2022-01-13 | Discharge: 2022-01-13 | Disposition: A | Discharge status: Home / Self Care | Payer: Medicare Other | Source: Ambulatory Visit | Attending: Hematology | Admitting: Hematology

## 2022-01-13 ENCOUNTER — Ambulatory Visit: Payer: Medicare Other

## 2022-01-13 DIAGNOSIS — R928 Other abnormal and inconclusive findings on diagnostic imaging of breast: Secondary | ICD-10-CM

## 2022-01-23 ENCOUNTER — Other Ambulatory Visit: Payer: Medicare Other

## 2022-02-02 ENCOUNTER — Encounter: Payer: Self-pay | Admitting: Nurse Practitioner

## 2022-02-02 ENCOUNTER — Other Ambulatory Visit: Payer: Self-pay

## 2022-02-02 ENCOUNTER — Inpatient Hospital Stay: Payer: Medicare Other

## 2022-02-02 ENCOUNTER — Inpatient Hospital Stay: Payer: Medicare Other | Attending: Nurse Practitioner | Admitting: Nurse Practitioner

## 2022-02-02 ENCOUNTER — Telehealth: Payer: Self-pay

## 2022-02-02 VITALS — BP 172/61 | HR 81 | Temp 98.6°F | Resp 20 | Ht 63.0 in | Wt 162.3 lb

## 2022-02-02 DIAGNOSIS — I129 Hypertensive chronic kidney disease with stage 1 through stage 4 chronic kidney disease, or unspecified chronic kidney disease: Secondary | ICD-10-CM | POA: Insufficient documentation

## 2022-02-02 DIAGNOSIS — Z90710 Acquired absence of both cervix and uterus: Secondary | ICD-10-CM | POA: Diagnosis not present

## 2022-02-02 DIAGNOSIS — D631 Anemia in chronic kidney disease: Secondary | ICD-10-CM | POA: Diagnosis not present

## 2022-02-02 DIAGNOSIS — Z170 Estrogen receptor positive status [ER+]: Secondary | ICD-10-CM | POA: Diagnosis not present

## 2022-02-02 DIAGNOSIS — M85852 Other specified disorders of bone density and structure, left thigh: Secondary | ICD-10-CM | POA: Diagnosis not present

## 2022-02-02 DIAGNOSIS — C50411 Malignant neoplasm of upper-outer quadrant of right female breast: Secondary | ICD-10-CM | POA: Insufficient documentation

## 2022-02-02 DIAGNOSIS — N189 Chronic kidney disease, unspecified: Secondary | ICD-10-CM

## 2022-02-02 DIAGNOSIS — E1122 Type 2 diabetes mellitus with diabetic chronic kidney disease: Secondary | ICD-10-CM | POA: Insufficient documentation

## 2022-02-02 LAB — CBC WITH DIFFERENTIAL/PLATELET
Abs Immature Granulocytes: 0.02 10*3/uL (ref 0.00–0.07)
Basophils Absolute: 0 10*3/uL (ref 0.0–0.1)
Basophils Relative: 1 %
Eosinophils Absolute: 0.1 10*3/uL (ref 0.0–0.5)
Eosinophils Relative: 2 %
HCT: 25.4 % — ABNORMAL LOW (ref 36.0–46.0)
Hemoglobin: 8 g/dL — ABNORMAL LOW (ref 12.0–15.0)
Immature Granulocytes: 0 %
Lymphocytes Relative: 22 %
Lymphs Abs: 1.4 10*3/uL (ref 0.7–4.0)
MCH: 30.8 pg (ref 26.0–34.0)
MCHC: 31.5 g/dL (ref 30.0–36.0)
MCV: 97.7 fL (ref 80.0–100.0)
Monocytes Absolute: 1 10*3/uL (ref 0.1–1.0)
Monocytes Relative: 16 %
Neutro Abs: 3.9 10*3/uL (ref 1.7–7.7)
Neutrophils Relative %: 59 %
Platelets: 171 10*3/uL (ref 150–400)
RBC: 2.6 MIL/uL — ABNORMAL LOW (ref 3.87–5.11)
RDW: 14 % (ref 11.5–15.5)
WBC: 6.6 10*3/uL (ref 4.0–10.5)
nRBC: 0 % (ref 0.0–0.2)

## 2022-02-02 LAB — IRON AND IRON BINDING CAPACITY (CC-WL,HP ONLY)
Iron: 69 ug/dL (ref 28–170)
Saturation Ratios: 27 % (ref 10.4–31.8)
TIBC: 259 ug/dL (ref 250–450)
UIBC: 190 ug/dL (ref 148–442)

## 2022-02-02 LAB — COMPREHENSIVE METABOLIC PANEL
ALT: 14 U/L (ref 0–44)
AST: 19 U/L (ref 15–41)
Albumin: 4.1 g/dL (ref 3.5–5.0)
Alkaline Phosphatase: 78 U/L (ref 38–126)
Anion gap: 8 (ref 5–15)
BUN: 56 mg/dL — ABNORMAL HIGH (ref 8–23)
CO2: 25 mmol/L (ref 22–32)
Calcium: 9.1 mg/dL (ref 8.9–10.3)
Chloride: 110 mmol/L (ref 98–111)
Creatinine, Ser: 3.05 mg/dL (ref 0.44–1.00)
GFR, Estimated: 15 mL/min — ABNORMAL LOW (ref >60)
Glucose, Bld: 91 mg/dL (ref 70–99)
Potassium: 4.3 mmol/L (ref 3.5–5.1)
Sodium: 143 mmol/L (ref 135–145)
Total Bilirubin: 0.4 mg/dL (ref 0.3–1.2)
Total Protein: 7.4 g/dL (ref 6.5–8.1)

## 2022-02-02 LAB — FERRITIN: Ferritin: 102 ng/mL (ref 11–307)

## 2022-02-02 NOTE — Telephone Encounter (Signed)
Ulice Dash in lab called reporting critical Scr+ 3.05.  Notified Dr. Burr Medico of pt's critical lab value.

## 2022-02-02 NOTE — Progress Notes (Signed)
[place]   Telephone:([phone] Fax:([phone]) [phone]   Clinic Follow up Note   Patient Care Team: Erica Ebbs, MD as PCP - General (Internal Medicine) Erica Kussmaul, MD as Consulting Physician (General Surgery) Erica Merle, MD as Consulting Physician (Hematology) Erica Silversmith, MD as Consulting Physician (Radiation Oncology) Erica Bouche, NP (Inactive) as Nurse Practitioner (Nurse Practitioner) 02/02/2022  CHIEF COMPLAINT: Follow up right breast cancer   SUMMARY OF ONCOLOGIC HISTORY: Oncology History Overview Note  Cancer Staging Breast cancer of upper-outer quadrant of right female breast [facility]) Staging form: Breast, AJCC 7th Edition - Clinical: Stage IA (T1b, N0, M0) - Unsigned - Pathologic stage from 12/17/2014: Stage IA (T1c, N0, cM0) - Unsigned     Breast cancer of upper-outer quadrant of right female breast ([place])  10/26/2014 Imaging   screening mammogram showed a 69m mass in right breast at 10 o'clock position.    11/05/2014 Pathology Results   Biopsy showed grade 2 IDC, ER 90%+, PR 10%+, HER2-, with grade 1 DCIS    11/06/2014 Initial Diagnosis   Breast cancer of upper-outer quadrant of right female breast   12/17/2014 Definitive Surgery   Right lumpectomy with SLNB revealed grade 2, IDC spanning 1.1 cm; associated grade 2 DCIS. HER2 repeated and remains negative. Surgical margins clear.    12/17/2014 Pathologic Stage   pT1cpN0M0; Stage IA   01/07/2015 - 12/2019 Anti-estrogen oral therapy   Exemestane 271mdaily since 01/14/15-12/2019   01/12/2015 Survivorship   Patient eligible for Survivorship after having completed all anti-cancer treatments (with exception of anti-estrogen) and currently NED.   11/06/2016 Mammogram   IMPRESSION: No mammographic evidence of malignancy.   11/12/2017 Mammogram   IMPRESSION: No mammographic evidence of malignancy.   RECOMMENDATION: Annual diagnostic mammography.   11/13/2018 Mammogram    IMPRESSION: No evidence of malignancy in either breast. RECOMMENDATION: Bilateral diagnostic mammogram in 1 year is recommended.   11/22/2018 Imaging   Bone scan:  FINDINGS: Scoliosis and mild degenerative uptake in the thoracic and lumbar spine. Increased uptake in the feet bilaterally, likely degenerative. No focal bone uptake suspicious for metastatic disease. IMPRESSION: No evidence for osseous metastatic disease. Degenerative uptake in the spine and feet.     CURRENT THERAPY: Exemestane daily x5 years, completed 01/2021  INTERVAL HISTORY: [name] for follow up as scheduled. Last seen by Dr. FeBurr Medico/2022.  She feels okay in general, but very tired with occasional exertional dyspnea.  She continues oral iron p.o. once daily, tolerating with mild constipation and dark stool.  Denies obvious GI bleeding.  Her kidney doctor Dr. FoRoyce Macadamialans to start Retacrit 2/21.  She denies breast concerns or any other changes.  All other systems were reviewed with the patient and are negative.  MEDICAL HISTORY:  Past Medical History:  Diagnosis Date   Arthritis    Breast cancer ([facility]   [place]   Depression    Diabetes mellitus    Hypercholesteremia    Hypertension    Insomnia    Peptic ulcer    [place]   dx in her 5084s Wears glasses     SURGICAL HISTORY: Past Surgical History:  Procedure Laterality Date   ABDOMINAL HYSTERECTOMY  1995   BACK SURGERY  2000   lumb lam   BIOPSY  05/14/2021   Procedure: BIOPSY;  Surgeon: Erica StablerMD;  [facility];  Service: Gastroenterology;;  BREAST LUMPECTOMY Right 2015   COLONOSCOPY     ESOPHAGOGASTRODUODENOSCOPY N/A 05/14/2021   Procedure: ESOPHAGOGASTRODUODENOSCOPY (EGD);  Surgeon: Erica Stabler, MD;  Location: [place];  Service: Gastroenterology;  Laterality: N/A;   ESOPHAGOGASTRODUODENOSCOPY (EGD) WITH PROPOFOL N/A  05/26/2021   Procedure: ESOPHAGOGASTRODUODENOSCOPY (EGD) WITH PROPOFOL;  Surgeon: Erica Ada, MD;  Location: [place];  Service: Endoscopy;  Laterality: N/A;   HEMOSTASIS CLIP PLACEMENT  05/26/2021   Procedure: HEMOSTASIS CLIP PLACEMENT;  Surgeon: Erica Ada, MD;  Location: [place];  Service: Endoscopy;;   HEMOSTASIS CONTROL  05/14/2021   Procedure: HEMOSTASIS CONTROL;  Surgeon: Erica Stabler, MD;  Location: [place];  Service: Gastroenterology;;   HOT HEMOSTASIS N/A 05/14/2021   Procedure: HOT HEMOSTASIS (ARGON PLASMA COAGULATION/BICAP);  Surgeon: Erica Stabler, MD;  Location: [place];  Service: Gastroenterology;  Laterality: N/A;   HOT HEMOSTASIS N/A 05/26/2021   Procedure: HOT HEMOSTASIS (ARGON PLASMA COAGULATION/BICAP);  Surgeon: Erica Ada, MD;  Location: [place];  Service: Endoscopy;  Laterality: N/A;   ORIF METACARPAL FRACTURE  2012   left   RADIOACTIVE SEED GUIDED PARTIAL MASTECTOMY WITH AXILLARY SENTINEL LYMPH NODE BIOPSY Right 12/17/2014   Procedure: RIGHT BREAST RADIOACTIVE SEED LOCALIZED LUMPECTOMY AND SENTINEL NODE MAPPING;  Surgeon: Erica Messing III, MD;  Location: [place];  Service: General;  Laterality: Right;   SCLEROTHERAPY  05/26/2021   Procedure: Erica Whitney;  Surgeon: Erica Ada, MD;  Location: [facility] ENDOSCOPY;  Service: Endoscopy;;    I have reviewed the social history and family history with the patient and they are unchanged from previous note.  ALLERGIES:  has No Known Allergies.  MEDICATIONS:  Current Outpatient Medications  Medication Sig Dispense Refill   ACCU-CHEK GUIDE test strip qd     AIMSCO INSULIN SYR ULTRA THIN 31G X 5/16" 0.3 ML MISC      amLODipine (NORVASC) 10 MG tablet Take 1 tablet (10 mg total) by mouth daily. 90 tablet 1   atorvastatin (LIPITOR) 40 MG tablet Take 40 mg by mouth daily.     Blood Glucose Calibration (ACCU-CHEK GUIDE CONTROL) LIQD      Calcium Carbonate-Vitamin D 600-400 MG-UNIT  tablet Take 1 tablet by mouth daily.     cetirizine (ZYRTEC) 10 MG tablet Take 10 mg by mouth daily as needed for allergies.     cholecalciferol (VITAMIN D3) 25 MCG (1000 UNIT) tablet Take 1,000 Units by mouth daily.     cycloSPORINE (RESTASIS) 0.05 % ophthalmic emulsion Place 1 drop into both eyes 2 (two) times daily.     diclofenac Sodium (VOLTAREN) 1 % GEL Apply 4 g topically 4 (four) times daily as needed (knee/leg pain).     ferrous sulfate 325 (65 FE) MG tablet Take 1 tablet (325 mg total) by mouth daily with breakfast. 30 tablet 1   furosemide (LASIX) 40 MG tablet Take 1 tablet (40 mg total) by mouth daily as needed for fluid (ankle swelling). 30 tablet    gabapentin (NEURONTIN) 100 MG capsule Take 100 mg by mouth 2 (two) times daily.     hydrALAZINE (APRESOLINE) 50 MG tablet Take 1 tablet (50 mg total) by mouth 3 (three) times daily. 90 tablet 1   mirtazapine (REMERON) 30 MG tablet Take 30 mg by mouth at bedtime.     pantoprazole (PROTONIX) 40 MG tablet Take 1 tablet (40 mg total) by mouth 2 (two) times daily before a meal for 30 days, THEN 1 tablet (40 mg total) daily. 120 tablet 0  Prenatal Vit-Fe Fumarate-FA (PRENATAL PO) Take 1 tablet by mouth daily.     senna-docusate (SENOKOT-S) 8.6-50 MG tablet Take 1 tablet by mouth at bedtime as needed for mild constipation or moderate constipation. 60 tablet 0   sertraline (ZOLOFT) 100 MG tablet Take 100 mg by mouth every morning.     triamcinolone cream (KENALOG) 0.1 % Apply 1 application topically 2 (two) times daily as needed (rash).     vitamin C (ASCORBIC ACID) 500 MG tablet Take 500 mg by mouth daily.     No current facility-administered medications for this visit.    PHYSICAL EXAMINATION: ECOG PERFORMANCE STATUS: 1 - Symptomatic but completely ambulatory  Vitals:   02/02/22 1047  BP: (!) 172/61  Pulse: 81  Resp: 20  Temp: 98.6 F (37 C)  SpO2: 98%   Filed Weights   02/02/22 1047  Weight: 162 lb 4.8 oz (73.6 kg)     GENERAL:alert, no distress and comfortable SKIN: no rash  EYES: sclera clear NECK: without mass LYMPH:  no palpable cervical or supraclavicular lymphadenopathy LUNGS: clear with normal breathing effort HEART: regular rate & rhythm, no lower extremity edema ABDOMEN:abdomen soft, non-tender and normal bowel sounds NEURO: alert & oriented x 3 with fluent speech, no focal motor/sensory deficits Breast exam: Breasts are symmetrical without nipple discharge or inversion.  S/p right lumpectomy, incisions completely healed.  Some scar tissue in the axilla.  No palpable mass or nodularity in either breast or axilla that I could appreciate.  LABORATORY DATA:  I have reviewed the data as listed CBC Latest Ref Rng & Units 02/02/2022 05/30/2021 05/29/2021  WBC 4.0 - 10.5 K/uL 6.6 6.6 -  Hemoglobin 12.0 - 15.0 g/dL 8.0(L) 8.2(L) 8.6(L)  Hematocrit 36.0 - 46.0 % 25.4(L) 25.9(L) 26.8(L)  Platelets 150 - 400 K/uL 171 199 -     CMP Latest Ref Rng & Units 02/02/2022 05/30/2021 05/29/2021  Glucose 70 - 99 mg/dL 91 111(H) 108(H)  BUN 8 - 23 mg/dL 56(H) 40(H) 47(H)  Creatinine 0.44 - 1.00 mg/dL 3.05(HH) 2.26(H) 2.22(H)  Sodium 135 - 145 mmol/L 143 141 139  Potassium 3.5 - 5.1 mmol/L 4.3 3.7 3.9  Chloride 98 - 111 mmol/L 110 112(H) 111  CO2 22 - 32 mmol/L _0 Calcium 8.9 - 10.3 mg/dL 9.1 8.6(L) 8.6(L)  Total Protein 6.5 - 8.1 g/dL 7.4 - -  Total Bilirubin 0.3 - 1.2 mg/dL 0.4 - -  Alkaline Phos 38 - 126 U/L 78 - -  AST 15 - 41 U/L 19 - -  ALT 0 - 44 U/L 14 - -      RADIOGRAPHIC STUDIES: I have personally reviewed the radiological images as listed and agreed with the findings in the report. No results found.   ASSESSMENT & PLAN: Erica Whitney is a 81 y.o. female with      1. 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 -Diagnosed in 10/2014. S/p right lumpectomy, adjuvant radiation and of AI with Exemestane, she  completed 5 years 01/2021.  -Bilateral mammogram 12/05/2021 showed possible right breast mass, diagnostic right mammo 01/13/2022 showed benign fibroglandular tissue, no evidence of malignancy.  Plan to repeat in 1 year -Ms. Erica Whitney is clinically doing well, breast exam is benign.  We reviewed her recent breast imaging which was negative.  Overall there is no clinical concern for breast cancer recurrence. -Labs show worsening CKD and anemia, Hgb 8.  Patient is symptomatic with fatigue  and exertional dyspnea.  The plan is to begin Retacrit per Dr. Royce Macadamia starting 02/07/2022. -Continue breast cancer surveillance, next follow-up in 1 year, or sooner if needed   2.  Anemia of chronic disease  -She has had a mild anemia since 2015, Hgb around 11 -Since 2020 her hemoglobin has steadily declined -She was hospitalized in May and June of 2022 for bleeding duodenal ulcer, managed by Dr. Fuller Plan.  She is on PPI and oral iron - Hgb 8.0 today, patient is symptomatic with fatigue and exertional dyspnea.  Iron panel and ferritin are normal, consistent with anemia of chronic disease -CMP shows worsening CKD with SCr up to 3.  She is scheduled to begin Retacrit 02/07/2022 per Dr. Royce Macadamia   3. Osteopenia -Her 11/2017 DEXA showed osteopenia, T score at left femoral neck -1.3. Her 11/2019 DEXA showed improvement and now normal (T-score 0.9).  -Continue calcium and vitamin D.  -Repeat DEXA 12/2022 with mammogram  4. HTN, DM, CKD -Follow-up PCP and nephrologist   Plan: -Recent mammography reviewed  -Labs reviewed  -Mammo and DEXA 12/2022 -Continue breast cancer surveillance, next lab and f/up in 1 year, or sooner if needed -Begin retacrit per Dr. Royce Macadamia 2/2, cc my note  -Continue oral iron p.o. once daily    Orders Placed This Encounter  Procedures   MM 3D SCREEN BREAST BILATERAL    Standing Status:   Future    Standing Expiration Date:   02/02/2023    Order Specific Question:   Reason for Exam (SYMPTOM  OR  DIAGNOSIS REQUIRED)    Answer:   h/o R breast cancer    Order Specific Question:   Preferred imaging location?    Answer:   [facility]   DG Bone Density    Standing Status:   Future    Standing Expiration Date:   02/02/2023    Order Specific Question:   Reason for Exam (SYMPTOM  OR DIAGNOSIS REQUIRED)    Answer:   osteopenia, completed AI in 01/2021    Order Specific Question:   Preferred imaging location?    Answer:   [facility]   Ferritin    Standing Status:   Standing    Number of Occurrences:   10    Standing Expiration Date:   02/02/2023   Iron and Iron Binding Capacity ([facility] only)    Standing Status:   Standing    Number of Occurrences:   10    Standing Expiration Date:   02/02/2023   All questions were answered. The patient knows to call the clinic with any problems, questions or concerns. No barriers to learning were detected.     Alla Feeling, NP 02/02/22

## 2022-02-03 ENCOUNTER — Telehealth: Payer: Self-pay | Admitting: Hematology

## 2022-02-03 NOTE — Telephone Encounter (Signed)
Unable to leave message with follow-up appointment per 2/16 los. Mailed calendar.

## 2022-02-06 ENCOUNTER — Other Ambulatory Visit (HOSPITAL_COMMUNITY): Payer: Self-pay | Admitting: *Deleted

## 2022-02-07 ENCOUNTER — Ambulatory Visit (HOSPITAL_COMMUNITY)
Admission: RE | Admit: 2022-02-07 | Discharge: 2022-02-07 | Disposition: A | Discharge status: Home / Self Care | Payer: Medicare Other | Source: Ambulatory Visit | Attending: Nephrology | Admitting: Nephrology

## 2022-02-07 ENCOUNTER — Other Ambulatory Visit: Payer: Self-pay

## 2022-02-07 DIAGNOSIS — N184 Chronic kidney disease, stage 4 (severe): Secondary | ICD-10-CM | POA: Insufficient documentation

## 2022-02-07 DIAGNOSIS — D631 Anemia in chronic kidney disease: Secondary | ICD-10-CM | POA: Diagnosis not present

## 2022-02-07 MED ORDER — EPOETIN ALFA-EPBX 10000 UNIT/ML IJ SOLN
10000.0000 [IU] | Freq: Once | INTRAMUSCULAR | Status: AC
  Administered 2022-02-07: 10000 [IU] via SUBCUTANEOUS

## 2022-02-07 MED ORDER — EPOETIN ALFA-EPBX 10000 UNIT/ML IJ SOLN
INTRAMUSCULAR | Status: AC
  Filled 2022-02-07: qty 1

## 2022-02-07 NOTE — Progress Notes (Signed)
Notified Erica Whitney at Dr Luis Abed office of Hgb 7.8 and ok to give injection

## 2022-02-08 LAB — POCT HEMOGLOBIN-HEMACUE: Hemoglobin: 7.8 g/dL — ABNORMAL LOW (ref 12.0–15.0)

## 2022-02-20 ENCOUNTER — Other Ambulatory Visit (HOSPITAL_COMMUNITY): Payer: Self-pay

## 2022-02-21 ENCOUNTER — Encounter (HOSPITAL_COMMUNITY)
Admission: RE | Admit: 2022-02-21 | Discharge: 2022-02-21 | Disposition: A | Discharge status: Home / Self Care | Payer: Medicare Other | Source: Ambulatory Visit | Attending: Nephrology | Admitting: Nephrology

## 2022-02-21 ENCOUNTER — Other Ambulatory Visit: Payer: Self-pay

## 2022-02-21 DIAGNOSIS — D631 Anemia in chronic kidney disease: Secondary | ICD-10-CM | POA: Insufficient documentation

## 2022-02-21 DIAGNOSIS — N184 Chronic kidney disease, stage 4 (severe): Secondary | ICD-10-CM | POA: Insufficient documentation

## 2022-02-21 LAB — POCT HEMOGLOBIN-HEMACUE: Hemoglobin: 8.9 g/dL — ABNORMAL LOW (ref 12.0–15.0)

## 2022-02-21 MED ORDER — EPOETIN ALFA-EPBX 10000 UNIT/ML IJ SOLN
INTRAMUSCULAR | Status: AC
  Filled 2022-02-21: qty 1

## 2022-02-21 MED ORDER — EPOETIN ALFA-EPBX 10000 UNIT/ML IJ SOLN
10000.0000 [IU] | Freq: Once | INTRAMUSCULAR | Status: AC
  Administered 2022-02-21: 10000 [IU] via SUBCUTANEOUS

## 2022-03-06 ENCOUNTER — Other Ambulatory Visit (HOSPITAL_COMMUNITY): Payer: Self-pay

## 2022-03-07 ENCOUNTER — Encounter (HOSPITAL_COMMUNITY): Payer: Medicare Other

## 2022-03-07 ENCOUNTER — Encounter: Payer: Self-pay | Admitting: Nephrology

## 2022-03-07 ENCOUNTER — Encounter (HOSPITAL_COMMUNITY)
Admission: RE | Admit: 2022-03-07 | Discharge: 2022-03-07 | Disposition: A | Discharge status: Home / Self Care | Payer: Medicare Other | Source: Ambulatory Visit | Attending: Nephrology | Admitting: Nephrology

## 2022-03-07 ENCOUNTER — Other Ambulatory Visit: Payer: Self-pay

## 2022-03-07 MED ORDER — EPOETIN ALFA-EPBX 10000 UNIT/ML IJ SOLN: 10000.0000 [IU] | Freq: Once | INTRAMUSCULAR | Status: DC

## 2022-03-07 NOTE — Progress Notes (Signed)
Bp  elevated 188/62- 213/83. Patient stated she took BP med this AM. Daughter of patient did not want mother to take clonidine and would just reschedule for next week. ?

## 2022-03-13 ENCOUNTER — Other Ambulatory Visit (HOSPITAL_COMMUNITY): Payer: Self-pay | Admitting: *Deleted

## 2022-03-14 ENCOUNTER — Other Ambulatory Visit: Payer: Self-pay

## 2022-03-14 ENCOUNTER — Encounter (HOSPITAL_COMMUNITY)
Admission: RE | Admit: 2022-03-14 | Discharge: 2022-03-14 | Disposition: A | Discharge status: Home / Self Care | Payer: Medicare Other | Source: Ambulatory Visit | Attending: Nephrology | Admitting: Nephrology

## 2022-03-14 DIAGNOSIS — N184 Chronic kidney disease, stage 4 (severe): Secondary | ICD-10-CM | POA: Diagnosis not present

## 2022-03-14 LAB — IRON AND TIBC
Iron: 70 ug/dL (ref 28–170)
Saturation Ratios: 30 % (ref 10.4–31.8)
TIBC: 230 ug/dL — ABNORMAL LOW (ref 250–450)
UIBC: 160 ug/dL

## 2022-03-14 LAB — POCT HEMOGLOBIN-HEMACUE: Hemoglobin: 8.5 g/dL — ABNORMAL LOW (ref 12.0–15.0)

## 2022-03-14 LAB — FERRITIN: Ferritin: 79 ng/mL (ref 11–307)

## 2022-03-14 MED ORDER — CLONIDINE HCL 0.1 MG PO TABS
0.1000 mg | ORAL_TABLET | Freq: Once | ORAL | Status: AC
  Administered 2022-03-14: 0.1 mg via ORAL

## 2022-03-14 MED ORDER — EPOETIN ALFA-EPBX 10000 UNIT/ML IJ SOLN
10000.0000 [IU] | Freq: Once | INTRAMUSCULAR | Status: AC
  Administered 2022-03-14: 10000 [IU] via SUBCUTANEOUS

## 2022-03-14 MED ORDER — CLONIDINE HCL 0.1 MG PO TABS
ORAL_TABLET | ORAL | Status: AC
  Filled 2022-03-14: qty 1

## 2022-03-14 MED ORDER — EPOETIN ALFA-EPBX 10000 UNIT/ML IJ SOLN
INTRAMUSCULAR | Status: AC
Start: 2022-03-14 — End: 2022-03-14
  Filled 2022-03-14: qty 1

## 2022-03-21 ENCOUNTER — Encounter (HOSPITAL_COMMUNITY)
Admission: RE | Admit: 2022-03-21 | Discharge: 2022-03-21 | Disposition: A | Discharge status: Home / Self Care | Payer: Medicare Other | Source: Ambulatory Visit | Attending: Nephrology | Admitting: Nephrology

## 2022-03-21 DIAGNOSIS — D631 Anemia in chronic kidney disease: Secondary | ICD-10-CM | POA: Insufficient documentation

## 2022-03-21 DIAGNOSIS — N184 Chronic kidney disease, stage 4 (severe): Secondary | ICD-10-CM | POA: Insufficient documentation

## 2022-03-21 LAB — POCT HEMOGLOBIN-HEMACUE: Hemoglobin: 9 g/dL — ABNORMAL LOW (ref 12.0–15.0)

## 2022-03-21 MED ORDER — EPOETIN ALFA-EPBX 10000 UNIT/ML IJ SOLN: 20000.0000 [IU] | Freq: Once | INTRAMUSCULAR | Status: DC

## 2022-03-21 MED ORDER — EPOETIN ALFA-EPBX 10000 UNIT/ML IJ SOLN
INTRAMUSCULAR | Status: AC
  Administered 2022-03-21: 20000 [IU] via SUBCUTANEOUS
  Filled 2022-03-21: qty 2

## 2022-04-04 ENCOUNTER — Encounter (HOSPITAL_COMMUNITY)
Admission: RE | Admit: 2022-04-04 | Discharge: 2022-04-04 | Disposition: A | Discharge status: Home / Self Care | Payer: Medicare Other | Source: Ambulatory Visit | Attending: Nephrology | Admitting: Nephrology

## 2022-04-04 DIAGNOSIS — N184 Chronic kidney disease, stage 4 (severe): Secondary | ICD-10-CM | POA: Diagnosis not present

## 2022-04-04 LAB — POCT HEMOGLOBIN-HEMACUE: Hemoglobin: 9.4 g/dL — ABNORMAL LOW (ref 12.0–15.0)

## 2022-04-04 MED ORDER — EPOETIN ALFA-EPBX 10000 UNIT/ML IJ SOLN
INTRAMUSCULAR | Status: AC
  Filled 2022-04-04: qty 2

## 2022-04-04 MED ORDER — EPOETIN ALFA-EPBX 10000 UNIT/ML IJ SOLN
20000.0000 [IU] | Freq: Once | INTRAMUSCULAR | Status: DC
  Administered 2022-04-04: 20000 [IU] via SUBCUTANEOUS

## 2022-04-18 ENCOUNTER — Encounter: Payer: Self-pay | Admitting: Nephrology

## 2022-04-18 ENCOUNTER — Encounter (HOSPITAL_COMMUNITY)
Admission: RE | Admit: 2022-04-18 | Discharge: 2022-04-18 | Disposition: A | Discharge status: Home / Self Care | Payer: Medicare Other | Source: Ambulatory Visit | Attending: Nephrology | Admitting: Nephrology

## 2022-04-18 DIAGNOSIS — D631 Anemia in chronic kidney disease: Secondary | ICD-10-CM | POA: Diagnosis present

## 2022-04-18 DIAGNOSIS — N184 Chronic kidney disease, stage 4 (severe): Secondary | ICD-10-CM | POA: Insufficient documentation

## 2022-04-18 LAB — IRON AND TIBC
Iron: 84 ug/dL (ref 28–170)
Saturation Ratios: 35 % — ABNORMAL HIGH (ref 10.4–31.8)
TIBC: 242 ug/dL — ABNORMAL LOW (ref 250–450)
UIBC: 158 ug/dL

## 2022-04-18 LAB — FERRITIN: Ferritin: 63 ng/mL (ref 11–307)

## 2022-04-18 LAB — POCT HEMOGLOBIN-HEMACUE: Hemoglobin: 9.2 g/dL — ABNORMAL LOW (ref 12.0–15.0)

## 2022-04-18 MED ORDER — EPOETIN ALFA-EPBX 10000 UNIT/ML IJ SOLN: 20000.0000 [IU] | Freq: Once | INTRAMUSCULAR | Status: AC

## 2022-04-18 MED ORDER — EPOETIN ALFA-EPBX 10000 UNIT/ML IJ SOLN
INTRAMUSCULAR | Status: AC
  Administered 2022-04-18: 20000 [IU] via SUBCUTANEOUS
  Filled 2022-04-18: qty 2

## 2022-05-02 ENCOUNTER — Encounter (HOSPITAL_COMMUNITY)
Admission: RE | Admit: 2022-05-02 | Discharge: 2022-05-02 | Disposition: A | Discharge status: Home / Self Care | Payer: Medicare Other | Source: Ambulatory Visit | Attending: Nephrology | Admitting: Nephrology

## 2022-05-02 VITALS — BP 173/63 | HR 62 | Temp 97.3°F | Resp 20

## 2022-05-02 DIAGNOSIS — N184 Chronic kidney disease, stage 4 (severe): Secondary | ICD-10-CM | POA: Diagnosis not present

## 2022-05-02 DIAGNOSIS — D631 Anemia in chronic kidney disease: Secondary | ICD-10-CM

## 2022-05-02 LAB — POCT HEMOGLOBIN-HEMACUE: Hemoglobin: 9.9 g/dL — ABNORMAL LOW (ref 12.0–15.0)

## 2022-05-02 MED ORDER — CLONIDINE HCL 0.1 MG PO TABS
ORAL_TABLET | ORAL | Status: AC
  Filled 2022-05-02: qty 1

## 2022-05-02 MED ORDER — EPOETIN ALFA-EPBX 10000 UNIT/ML IJ SOLN
INTRAMUSCULAR | Status: AC
Start: 2022-05-02 — End: 2022-05-02
  Filled 2022-05-02: qty 2

## 2022-05-02 MED ORDER — CLONIDINE HCL 0.1 MG PO TABS
0.1000 mg | ORAL_TABLET | Freq: Once | ORAL | Status: AC
  Administered 2022-05-02: 0.1 mg via ORAL

## 2022-05-02 MED ORDER — EPOETIN ALFA-EPBX 10000 UNIT/ML IJ SOLN
20000.0000 [IU] | INTRAMUSCULAR | Status: DC
  Administered 2022-05-02: 20000 [IU] via SUBCUTANEOUS

## 2022-05-02 NOTE — Progress Notes (Signed)
BP systolic 620 twice 20 minutes apart. Blood pressure above parameters for retacrit injection so clonidine given as ordered. Pt states she did take her blood pressure meds at home before coming to infusion clinic. ?

## 2022-05-16 ENCOUNTER — Inpatient Hospital Stay (HOSPITAL_COMMUNITY): Admission: RE | Admit: 2022-05-16 | Payer: Medicare Other | Source: Ambulatory Visit

## 2022-05-16 ENCOUNTER — Other Ambulatory Visit: Payer: Self-pay | Admitting: Internal Medicine

## 2022-05-17 LAB — COMPLETE METABOLIC PANEL WITH GFR
AG Ratio: 1.3 (calc) (ref 1.0–2.5)
ALT: 18 U/L (ref 6–29)
AST: 22 U/L (ref 10–35)
Albumin: 4 g/dL (ref 3.6–5.1)
Alkaline phosphatase (APISO): 77 U/L (ref 37–153)
BUN/Creatinine Ratio: 17 (calc) (ref 6–22)
BUN: 57 mg/dL — ABNORMAL HIGH (ref 7–25)
CO2: 20 mmol/L (ref 20–32)
Calcium: 8.8 mg/dL (ref 8.6–10.4)
Chloride: 109 mmol/L (ref 98–110)
Creat: 3.41 mg/dL — ABNORMAL HIGH (ref 0.60–1.00)
Globulin: 3.2 g/dL (calc) (ref 1.9–3.7)
Glucose, Bld: 91 mg/dL (ref 65–99)
Potassium: 4 mmol/L (ref 3.5–5.3)
Sodium: 143 mmol/L (ref 135–146)
Total Bilirubin: 0.4 mg/dL (ref 0.2–1.2)
Total Protein: 7.2 g/dL (ref 6.1–8.1)
eGFR: 13 mL/min/{1.73_m2} — ABNORMAL LOW (ref >=60)

## 2022-05-17 LAB — LIPID PANEL
Cholesterol: 150 mg/dL (ref <200)
HDL: 70 mg/dL (ref >=50)
LDL Cholesterol (Calc): 64 mg/dL (calc)
Non-HDL Cholesterol (Calc): 80 mg/dL (calc) (ref <130)
Total CHOL/HDL Ratio: 2.1 (calc) (ref <5.0)
Triglycerides: 78 mg/dL (ref <150)

## 2022-05-17 LAB — CBC
HCT: 27.5 % — ABNORMAL LOW (ref 35.0–45.0)
Hemoglobin: 9 g/dL — ABNORMAL LOW (ref 11.7–15.5)
MCH: 31 pg (ref 27.0–33.0)
MCHC: 32.7 g/dL (ref 32.0–36.0)
MCV: 94.8 fL (ref 80.0–100.0)
MPV: 10.7 fL (ref 7.5–12.5)
Platelets: 173 10*3/uL (ref 140–400)
RBC: 2.9 10*6/uL — ABNORMAL LOW (ref 3.80–5.10)
RDW: 13.4 % (ref 11.0–15.0)
WBC: 6.2 10*3/uL (ref 3.8–10.8)

## 2022-05-17 LAB — TSH: TSH: 1.41 mIU/L (ref 0.40–4.50)

## 2022-05-17 LAB — URIC ACID: Uric Acid, Serum: 8.2 mg/dL — ABNORMAL HIGH (ref 2.5–7.0)

## 2022-05-29 ENCOUNTER — Encounter (HOSPITAL_COMMUNITY)
Admission: RE | Admit: 2022-05-29 | Discharge: 2022-05-29 | Disposition: A | Discharge status: Home / Self Care | Payer: Medicare Other | Source: Ambulatory Visit | Attending: Nephrology | Admitting: Nephrology

## 2022-05-29 VITALS — BP 180/69 | HR 63 | Temp 98.3°F | Resp 18

## 2022-05-29 DIAGNOSIS — N184 Chronic kidney disease, stage 4 (severe): Secondary | ICD-10-CM | POA: Diagnosis not present

## 2022-05-29 DIAGNOSIS — D631 Anemia in chronic kidney disease: Secondary | ICD-10-CM | POA: Diagnosis present

## 2022-05-29 LAB — IRON AND TIBC
Iron: 84 ug/dL (ref 28–170)
Saturation Ratios: 35 % — ABNORMAL HIGH (ref 10.4–31.8)
TIBC: 242 ug/dL — ABNORMAL LOW (ref 250–450)
UIBC: 158 ug/dL

## 2022-05-29 LAB — POCT HEMOGLOBIN-HEMACUE: Hemoglobin: 8.5 g/dL — ABNORMAL LOW (ref 12.0–15.0)

## 2022-05-29 LAB — FERRITIN: Ferritin: 94 ng/mL (ref 11–307)

## 2022-05-29 MED ORDER — EPOETIN ALFA-EPBX 10000 UNIT/ML IJ SOLN: 20000.0000 [IU] | INTRAMUSCULAR | Status: DC

## 2022-05-29 MED ORDER — EPOETIN ALFA-EPBX 10000 UNIT/ML IJ SOLN
INTRAMUSCULAR | Status: AC
  Administered 2022-05-29: 20000 [IU] via SUBCUTANEOUS
  Filled 2022-05-29: qty 2

## 2022-06-12 ENCOUNTER — Encounter (HOSPITAL_COMMUNITY)
Admission: RE | Admit: 2022-06-12 | Discharge: 2022-06-12 | Disposition: A | Discharge status: Home / Self Care | Payer: Medicare Other | Source: Ambulatory Visit | Attending: Nephrology | Admitting: Nephrology

## 2022-06-12 DIAGNOSIS — N184 Chronic kidney disease, stage 4 (severe): Secondary | ICD-10-CM | POA: Diagnosis not present

## 2022-06-12 DIAGNOSIS — D631 Anemia in chronic kidney disease: Secondary | ICD-10-CM

## 2022-06-12 LAB — POCT HEMOGLOBIN-HEMACUE: Hemoglobin: 8.8 g/dL — ABNORMAL LOW (ref 12.0–15.0)

## 2022-06-12 MED ORDER — EPOETIN ALFA-EPBX 10000 UNIT/ML IJ SOLN
20000.0000 [IU] | INTRAMUSCULAR | Status: DC
  Administered 2022-06-12: 20000 [IU] via SUBCUTANEOUS

## 2022-06-12 MED ORDER — EPOETIN ALFA-EPBX 10000 UNIT/ML IJ SOLN
INTRAMUSCULAR | Status: AC
  Filled 2022-06-12: qty 2

## 2022-06-26 ENCOUNTER — Encounter (HOSPITAL_COMMUNITY)
Admission: RE | Admit: 2022-06-26 | Discharge: 2022-06-26 | Disposition: A | Discharge status: Home / Self Care | Payer: Medicare Other | Source: Ambulatory Visit | Attending: Nephrology | Admitting: Nephrology

## 2022-06-26 VITALS — BP 150/59 | HR 56 | Temp 97.2°F

## 2022-06-26 DIAGNOSIS — D631 Anemia in chronic kidney disease: Secondary | ICD-10-CM | POA: Insufficient documentation

## 2022-06-26 DIAGNOSIS — N184 Chronic kidney disease, stage 4 (severe): Secondary | ICD-10-CM | POA: Insufficient documentation

## 2022-06-26 LAB — IRON AND TIBC
Iron: 74 ug/dL (ref 28–170)
Saturation Ratios: 31 % (ref 10.4–31.8)
TIBC: 238 ug/dL — ABNORMAL LOW (ref 250–450)
UIBC: 164 ug/dL

## 2022-06-26 LAB — FERRITIN: Ferritin: 57 ng/mL (ref 11–307)

## 2022-06-26 LAB — POCT HEMOGLOBIN-HEMACUE: Hemoglobin: 9.1 g/dL — ABNORMAL LOW (ref 12.0–15.0)

## 2022-06-26 MED ORDER — EPOETIN ALFA 20000 UNIT/ML IJ SOLN
20000.0000 [IU] | Freq: Once | INTRAMUSCULAR | Status: AC
  Administered 2022-06-26: 20000 [IU] via SUBCUTANEOUS

## 2022-06-26 MED ORDER — EPOETIN ALFA-EPBX 40000 UNIT/ML IJ SOLN: 20000.0000 [IU] | INTRAMUSCULAR | Status: DC

## 2022-06-26 MED ORDER — EPOETIN ALFA 20000 UNIT/ML IJ SOLN
INTRAMUSCULAR | Status: AC
  Filled 2022-06-26: qty 1

## 2022-07-10 ENCOUNTER — Encounter (HOSPITAL_COMMUNITY)
Admission: RE | Admit: 2022-07-10 | Discharge: 2022-07-10 | Disposition: A | Discharge status: Home / Self Care | Payer: Medicare Other | Source: Ambulatory Visit | Attending: Nephrology | Admitting: Nephrology

## 2022-07-10 VITALS — BP 173/61 | HR 55 | Temp 97.3°F | Resp 18

## 2022-07-10 DIAGNOSIS — N184 Chronic kidney disease, stage 4 (severe): Secondary | ICD-10-CM | POA: Diagnosis not present

## 2022-07-10 DIAGNOSIS — D631 Anemia in chronic kidney disease: Secondary | ICD-10-CM

## 2022-07-10 LAB — POCT HEMOGLOBIN-HEMACUE: Hemoglobin: 9.5 g/dL — ABNORMAL LOW (ref 12.0–15.0)

## 2022-07-10 MED ORDER — EPOETIN ALFA 20000 UNIT/ML IJ SOLN
INTRAMUSCULAR | Status: AC
  Administered 2022-07-10: 20000 [IU] via SUBCUTANEOUS
  Filled 2022-07-10: qty 1

## 2022-07-10 MED ORDER — EPOETIN ALFA-EPBX 40000 UNIT/ML IJ SOLN: 20000.0000 [IU] | INTRAMUSCULAR | Status: DC

## 2022-07-24 ENCOUNTER — Encounter (HOSPITAL_COMMUNITY): Payer: Medicare Other

## 2022-07-25 ENCOUNTER — Encounter (HOSPITAL_COMMUNITY)
Admission: RE | Admit: 2022-07-25 | Discharge: 2022-07-25 | Disposition: A | Discharge status: Home / Self Care | Payer: Medicare Other | Source: Ambulatory Visit | Attending: Nephrology | Admitting: Nephrology

## 2022-07-25 VITALS — BP 180/69 | HR 64

## 2022-07-25 DIAGNOSIS — D631 Anemia in chronic kidney disease: Secondary | ICD-10-CM | POA: Insufficient documentation

## 2022-07-25 DIAGNOSIS — N184 Chronic kidney disease, stage 4 (severe): Secondary | ICD-10-CM | POA: Diagnosis present

## 2022-07-25 LAB — FERRITIN: Ferritin: 58 ng/mL (ref 11–307)

## 2022-07-25 LAB — IRON AND TIBC
Iron: 73 ug/dL (ref 28–170)
Saturation Ratios: 31 % (ref 10.4–31.8)
TIBC: 232 ug/dL — ABNORMAL LOW (ref 250–450)
UIBC: 159 ug/dL

## 2022-07-25 LAB — POCT HEMOGLOBIN-HEMACUE: Hemoglobin: 10 g/dL — ABNORMAL LOW (ref 12.0–15.0)

## 2022-07-25 MED ORDER — EPOETIN ALFA-EPBX 40000 UNIT/ML IJ SOLN: 20000.0000 [IU] | INTRAMUSCULAR | Status: DC

## 2022-07-25 MED ORDER — EPOETIN ALFA 20000 UNIT/ML IJ SOLN
INTRAMUSCULAR | Status: AC
  Administered 2022-07-25: 20000 [IU] via SUBCUTANEOUS
  Filled 2022-07-25: qty 1

## 2022-08-08 ENCOUNTER — Encounter (HOSPITAL_COMMUNITY)
Admission: RE | Admit: 2022-08-08 | Discharge: 2022-08-08 | Disposition: A | Discharge status: Home / Self Care | Payer: Medicare Other | Source: Ambulatory Visit | Attending: Nephrology | Admitting: Nephrology

## 2022-08-08 VITALS — BP 179/71 | HR 64 | Temp 97.8°F | Resp 12

## 2022-08-08 DIAGNOSIS — N184 Chronic kidney disease, stage 4 (severe): Secondary | ICD-10-CM | POA: Diagnosis not present

## 2022-08-08 DIAGNOSIS — D631 Anemia in chronic kidney disease: Secondary | ICD-10-CM

## 2022-08-08 LAB — POCT HEMOGLOBIN-HEMACUE: Hemoglobin: 9.8 g/dL — ABNORMAL LOW (ref 12.0–15.0)

## 2022-08-08 MED ORDER — EPOETIN ALFA-EPBX 40000 UNIT/ML IJ SOLN: 20000.0000 [IU] | INTRAMUSCULAR | Status: DC

## 2022-08-08 MED ORDER — EPOETIN ALFA 20000 UNIT/ML IJ SOLN
INTRAMUSCULAR | Status: AC
  Filled 2022-08-08: qty 1

## 2022-08-08 MED ORDER — EPOETIN ALFA 20000 UNIT/ML IJ SOLN
20000.0000 [IU] | Freq: Once | INTRAMUSCULAR | Status: AC
  Administered 2022-08-08: 20000 [IU] via SUBCUTANEOUS

## 2022-08-22 ENCOUNTER — Ambulatory Visit (HOSPITAL_COMMUNITY)
Admission: RE | Admit: 2022-08-22 | Discharge: 2022-08-22 | Disposition: A | Discharge status: Home / Self Care | Payer: Medicare Other | Source: Ambulatory Visit | Attending: Nephrology | Admitting: Nephrology

## 2022-08-22 VITALS — BP 180/76 | HR 61 | Temp 96.7°F | Resp 15

## 2022-08-22 DIAGNOSIS — N184 Chronic kidney disease, stage 4 (severe): Secondary | ICD-10-CM | POA: Diagnosis present

## 2022-08-22 DIAGNOSIS — D631 Anemia in chronic kidney disease: Secondary | ICD-10-CM | POA: Diagnosis present

## 2022-08-22 LAB — FERRITIN: Ferritin: 62 ng/mL (ref 11–307)

## 2022-08-22 LAB — IRON AND TIBC
Iron: 89 ug/dL (ref 28–170)
Saturation Ratios: 35 % — ABNORMAL HIGH (ref 10.4–31.8)
TIBC: 256 ug/dL (ref 250–450)
UIBC: 167 ug/dL

## 2022-08-22 MED ORDER — EPOETIN ALFA-EPBX 10000 UNIT/ML IJ SOLN
INTRAMUSCULAR | Status: AC
  Filled 2022-08-22: qty 2

## 2022-08-22 MED ORDER — EPOETIN ALFA-EPBX 10000 UNIT/ML IJ SOLN
20000.0000 [IU] | INTRAMUSCULAR | Status: DC
  Administered 2022-08-22: 20000 [IU] via SUBCUTANEOUS

## 2022-08-31 LAB — POCT HEMOGLOBIN-HEMACUE: Hemoglobin: 10 g/dL — ABNORMAL LOW (ref 12.0–15.0)

## 2022-09-05 ENCOUNTER — Ambulatory Visit (HOSPITAL_COMMUNITY)
Admission: RE | Admit: 2022-09-05 | Discharge: 2022-09-05 | Disposition: A | Discharge status: Home / Self Care | Payer: Medicare Other | Source: Ambulatory Visit | Attending: Nephrology | Admitting: Nephrology

## 2022-09-05 VITALS — BP 180/72 | HR 66 | Temp 97.3°F | Resp 16

## 2022-09-05 DIAGNOSIS — D631 Anemia in chronic kidney disease: Secondary | ICD-10-CM | POA: Insufficient documentation

## 2022-09-05 DIAGNOSIS — N184 Chronic kidney disease, stage 4 (severe): Secondary | ICD-10-CM | POA: Diagnosis not present

## 2022-09-05 LAB — POCT HEMOGLOBIN-HEMACUE: Hemoglobin: 9.9 g/dL — ABNORMAL LOW (ref 12.0–15.0)

## 2022-09-05 MED ORDER — EPOETIN ALFA-EPBX 10000 UNIT/ML IJ SOLN: 20000.0000 [IU] | INTRAMUSCULAR | Status: DC

## 2022-09-05 MED ORDER — EPOETIN ALFA-EPBX 10000 UNIT/ML IJ SOLN
INTRAMUSCULAR | Status: AC
  Administered 2022-09-05: 20000 [IU] via SUBCUTANEOUS
  Filled 2022-09-05: qty 2

## 2022-09-19 ENCOUNTER — Ambulatory Visit (HOSPITAL_COMMUNITY)
Admission: RE | Admit: 2022-09-19 | Discharge: 2022-09-19 | Disposition: A | Discharge status: Home / Self Care | Payer: Medicare Other | Source: Ambulatory Visit | Attending: Nephrology | Admitting: Nephrology

## 2022-09-19 VITALS — BP 177/68 | HR 63 | Resp 18

## 2022-09-19 DIAGNOSIS — N184 Chronic kidney disease, stage 4 (severe): Secondary | ICD-10-CM | POA: Insufficient documentation

## 2022-09-19 DIAGNOSIS — D631 Anemia in chronic kidney disease: Secondary | ICD-10-CM | POA: Insufficient documentation

## 2022-09-19 LAB — IRON AND TIBC
Iron: 66 ug/dL (ref 28–170)
Saturation Ratios: 26 % (ref 10.4–31.8)
TIBC: 259 ug/dL (ref 250–450)
UIBC: 193 ug/dL

## 2022-09-19 LAB — POCT HEMOGLOBIN-HEMACUE: Hemoglobin: 10.1 g/dL — ABNORMAL LOW (ref 12.0–15.0)

## 2022-09-19 LAB — FERRITIN: Ferritin: 84 ng/mL (ref 11–307)

## 2022-09-19 MED ORDER — EPOETIN ALFA-EPBX 10000 UNIT/ML IJ SOLN
INTRAMUSCULAR | Status: AC
  Filled 2022-09-19: qty 2

## 2022-09-19 MED ORDER — EPOETIN ALFA-EPBX 10000 UNIT/ML IJ SOLN
20000.0000 [IU] | INTRAMUSCULAR | Status: DC
  Administered 2022-09-19: 20000 [IU] via SUBCUTANEOUS

## 2022-10-03 ENCOUNTER — Encounter (HOSPITAL_COMMUNITY)
Admission: RE | Admit: 2022-10-03 | Discharge: 2022-10-03 | Disposition: A | Discharge status: Home / Self Care | Payer: Medicare Other | Source: Ambulatory Visit | Attending: Nephrology | Admitting: Nephrology

## 2022-10-03 VITALS — BP 160/63 | HR 57 | Temp 97.4°F

## 2022-10-03 DIAGNOSIS — N184 Chronic kidney disease, stage 4 (severe): Secondary | ICD-10-CM | POA: Diagnosis present

## 2022-10-03 DIAGNOSIS — D631 Anemia in chronic kidney disease: Secondary | ICD-10-CM | POA: Diagnosis present

## 2022-10-03 LAB — POCT HEMOGLOBIN-HEMACUE: Hemoglobin: 9.8 g/dL — ABNORMAL LOW (ref 12.0–15.0)

## 2022-10-03 MED ORDER — EPOETIN ALFA-EPBX 10000 UNIT/ML IJ SOLN
INTRAMUSCULAR | Status: AC
  Filled 2022-10-03: qty 2

## 2022-10-03 MED ORDER — EPOETIN ALFA-EPBX 10000 UNIT/ML IJ SOLN
20000.0000 [IU] | INTRAMUSCULAR | Status: DC
  Administered 2022-10-03: 20000 [IU] via SUBCUTANEOUS

## 2022-10-17 ENCOUNTER — Encounter (HOSPITAL_COMMUNITY)
Admission: RE | Admit: 2022-10-17 | Discharge: 2022-10-17 | Disposition: A | Discharge status: Home / Self Care | Payer: Medicare Other | Source: Ambulatory Visit | Attending: Nephrology | Admitting: Nephrology

## 2022-10-17 VITALS — BP 179/67 | HR 57 | Temp 96.9°F | Resp 18

## 2022-10-17 DIAGNOSIS — N184 Chronic kidney disease, stage 4 (severe): Secondary | ICD-10-CM | POA: Diagnosis not present

## 2022-10-17 DIAGNOSIS — D631 Anemia in chronic kidney disease: Secondary | ICD-10-CM

## 2022-10-17 LAB — POCT HEMOGLOBIN-HEMACUE: Hemoglobin: 10.4 g/dL — ABNORMAL LOW (ref 12.0–15.0)

## 2022-10-17 MED ORDER — EPOETIN ALFA-EPBX 10000 UNIT/ML IJ SOLN
INTRAMUSCULAR | Status: AC
  Filled 2022-10-17: qty 2

## 2022-10-17 MED ORDER — EPOETIN ALFA-EPBX 10000 UNIT/ML IJ SOLN
20000.0000 [IU] | INTRAMUSCULAR | Status: DC
  Administered 2022-10-17: 20000 [IU] via SUBCUTANEOUS

## 2022-10-31 ENCOUNTER — Encounter (HOSPITAL_COMMUNITY)
Admission: RE | Admit: 2022-10-31 | Discharge: 2022-10-31 | Disposition: A | Discharge status: Home / Self Care | Payer: Medicare Other | Source: Ambulatory Visit | Attending: Nephrology | Admitting: Nephrology

## 2022-10-31 VITALS — BP 168/68 | HR 59 | Temp 97.4°F | Resp 18

## 2022-10-31 DIAGNOSIS — D631 Anemia in chronic kidney disease: Secondary | ICD-10-CM | POA: Diagnosis present

## 2022-10-31 DIAGNOSIS — N184 Chronic kidney disease, stage 4 (severe): Secondary | ICD-10-CM | POA: Insufficient documentation

## 2022-10-31 LAB — IRON AND TIBC
Iron: 104 ug/dL (ref 28–170)
Saturation Ratios: 42 % — ABNORMAL HIGH (ref 10.4–31.8)
TIBC: 245 ug/dL — ABNORMAL LOW (ref 250–450)
UIBC: 141 ug/dL

## 2022-10-31 LAB — POCT HEMOGLOBIN-HEMACUE: Hemoglobin: 10.1 g/dL — ABNORMAL LOW (ref 12.0–15.0)

## 2022-10-31 LAB — FERRITIN: Ferritin: 98 ng/mL (ref 11–307)

## 2022-10-31 MED ORDER — EPOETIN ALFA-EPBX 10000 UNIT/ML IJ SOLN
INTRAMUSCULAR | Status: AC
  Filled 2022-10-31: qty 2

## 2022-10-31 MED ORDER — EPOETIN ALFA-EPBX 10000 UNIT/ML IJ SOLN
20000.0000 [IU] | INTRAMUSCULAR | Status: DC
  Administered 2022-10-31: 20000 [IU] via SUBCUTANEOUS

## 2022-11-14 ENCOUNTER — Encounter (HOSPITAL_COMMUNITY)
Admission: RE | Admit: 2022-11-14 | Discharge: 2022-11-14 | Disposition: A | Discharge status: Home / Self Care | Payer: Medicare Other | Source: Ambulatory Visit | Attending: Nephrology | Admitting: Nephrology

## 2022-11-14 VITALS — BP 159/62 | HR 57 | Temp 97.8°F | Resp 18

## 2022-11-14 DIAGNOSIS — N184 Chronic kidney disease, stage 4 (severe): Secondary | ICD-10-CM

## 2022-11-14 LAB — POCT HEMOGLOBIN-HEMACUE: Hemoglobin: 9.9 g/dL — ABNORMAL LOW (ref 12.0–15.0)

## 2022-11-14 MED ORDER — EPOETIN ALFA-EPBX 10000 UNIT/ML IJ SOLN
INTRAMUSCULAR | Status: AC
  Filled 2022-11-14: qty 2

## 2022-11-14 MED ORDER — EPOETIN ALFA-EPBX 10000 UNIT/ML IJ SOLN
20000.0000 [IU] | INTRAMUSCULAR | Status: DC
  Administered 2022-11-14: 20000 [IU] via SUBCUTANEOUS

## 2022-11-28 ENCOUNTER — Encounter (HOSPITAL_COMMUNITY): Payer: Medicare Other

## 2022-11-29 ENCOUNTER — Encounter (HOSPITAL_COMMUNITY)
Admission: RE | Admit: 2022-11-29 | Discharge: 2022-11-29 | Disposition: A | Discharge status: Home / Self Care | Payer: Medicare Other | Source: Ambulatory Visit | Attending: Nephrology | Admitting: Nephrology

## 2022-11-29 VITALS — BP 154/68 | HR 59 | Temp 97.7°F | Resp 18

## 2022-11-29 DIAGNOSIS — N184 Chronic kidney disease, stage 4 (severe): Secondary | ICD-10-CM | POA: Diagnosis present

## 2022-11-29 DIAGNOSIS — D631 Anemia in chronic kidney disease: Secondary | ICD-10-CM | POA: Diagnosis present

## 2022-11-29 LAB — IRON AND TIBC
Iron: 86 ug/dL (ref 28–170)
Saturation Ratios: 34 % — ABNORMAL HIGH (ref 10.4–31.8)
TIBC: 255 ug/dL (ref 250–450)
UIBC: 169 ug/dL

## 2022-11-29 LAB — POCT HEMOGLOBIN-HEMACUE: Hemoglobin: 10.2 g/dL — ABNORMAL LOW (ref 12.0–15.0)

## 2022-11-29 LAB — FERRITIN: Ferritin: 97 ng/mL (ref 11–307)

## 2022-11-29 MED ORDER — EPOETIN ALFA-EPBX 10000 UNIT/ML IJ SOLN
20000.0000 [IU] | INTRAMUSCULAR | Status: DC
  Administered 2022-11-29: 20000 [IU] via SUBCUTANEOUS

## 2022-11-29 MED ORDER — EPOETIN ALFA-EPBX 10000 UNIT/ML IJ SOLN
INTRAMUSCULAR | Status: AC
  Filled 2022-11-29: qty 2

## 2022-12-13 ENCOUNTER — Encounter (HOSPITAL_COMMUNITY)
Admission: RE | Admit: 2022-12-13 | Discharge: 2022-12-13 | Disposition: A | Discharge status: Home / Self Care | Payer: Medicare Other | Source: Ambulatory Visit | Attending: Nephrology | Admitting: Nephrology

## 2022-12-13 VITALS — BP 159/68 | HR 66 | Temp 97.3°F | Resp 18

## 2022-12-13 DIAGNOSIS — D631 Anemia in chronic kidney disease: Secondary | ICD-10-CM

## 2022-12-13 DIAGNOSIS — N184 Chronic kidney disease, stage 4 (severe): Secondary | ICD-10-CM | POA: Diagnosis not present

## 2022-12-13 LAB — POCT HEMOGLOBIN-HEMACUE: Hemoglobin: 9.9 g/dL — ABNORMAL LOW (ref 12.0–15.0)

## 2022-12-13 MED ORDER — EPOETIN ALFA-EPBX 10000 UNIT/ML IJ SOLN
20000.0000 [IU] | INTRAMUSCULAR | Status: DC
  Administered 2022-12-13: 20000 [IU] via SUBCUTANEOUS

## 2022-12-13 MED ORDER — EPOETIN ALFA-EPBX 10000 UNIT/ML IJ SOLN
INTRAMUSCULAR | Status: AC
  Filled 2022-12-13: qty 2

## 2022-12-27 ENCOUNTER — Encounter (HOSPITAL_COMMUNITY)
Admission: RE | Admit: 2022-12-27 | Discharge: 2022-12-27 | Disposition: A | Discharge status: Home / Self Care | Payer: 59 | Source: Ambulatory Visit | Attending: Nephrology | Admitting: Nephrology

## 2022-12-27 VITALS — BP 175/74 | HR 56 | Temp 97.7°F | Resp 18

## 2022-12-27 DIAGNOSIS — D631 Anemia in chronic kidney disease: Secondary | ICD-10-CM | POA: Diagnosis present

## 2022-12-27 DIAGNOSIS — N184 Chronic kidney disease, stage 4 (severe): Secondary | ICD-10-CM | POA: Insufficient documentation

## 2022-12-27 LAB — IRON AND TIBC
Iron: 92 ug/dL (ref 28–170)
Saturation Ratios: 38 % — ABNORMAL HIGH (ref 10.4–31.8)
TIBC: 241 ug/dL — ABNORMAL LOW (ref 250–450)
UIBC: 149 ug/dL

## 2022-12-27 LAB — FERRITIN: Ferritin: 108 ng/mL (ref 11–307)

## 2022-12-27 LAB — POCT HEMOGLOBIN-HEMACUE: Hemoglobin: 10.5 g/dL — ABNORMAL LOW (ref 12.0–15.0)

## 2022-12-27 MED ORDER — EPOETIN ALFA-EPBX 10000 UNIT/ML IJ SOLN
20000.0000 [IU] | INTRAMUSCULAR | Status: DC
  Administered 2022-12-27: 20000 [IU] via SUBCUTANEOUS

## 2022-12-27 MED ORDER — EPOETIN ALFA-EPBX 10000 UNIT/ML IJ SOLN
INTRAMUSCULAR | Status: AC
  Filled 2022-12-27: qty 2

## 2023-01-06 ENCOUNTER — Encounter (HOSPITAL_COMMUNITY): Payer: Self-pay

## 2023-01-10 ENCOUNTER — Encounter (HOSPITAL_COMMUNITY)
Admission: RE | Admit: 2023-01-10 | Discharge: 2023-01-10 | Disposition: A | Discharge status: Home / Self Care | Payer: 59 | Source: Ambulatory Visit | Attending: Nephrology | Admitting: Nephrology

## 2023-01-10 VITALS — BP 126/58 | HR 58 | Temp 97.6°F | Resp 17

## 2023-01-10 DIAGNOSIS — N184 Chronic kidney disease, stage 4 (severe): Secondary | ICD-10-CM | POA: Diagnosis not present

## 2023-01-10 DIAGNOSIS — D631 Anemia in chronic kidney disease: Secondary | ICD-10-CM

## 2023-01-10 LAB — POCT HEMOGLOBIN-HEMACUE: Hemoglobin: 10.5 g/dL — ABNORMAL LOW (ref 12.0–15.0)

## 2023-01-10 MED ORDER — EPOETIN ALFA-EPBX 10000 UNIT/ML IJ SOLN
INTRAMUSCULAR | Status: AC
  Filled 2023-01-10: qty 2

## 2023-01-10 MED ORDER — EPOETIN ALFA-EPBX 10000 UNIT/ML IJ SOLN
20000.0000 [IU] | INTRAMUSCULAR | Status: DC
  Administered 2023-01-10: 20000 [IU] via SUBCUTANEOUS

## 2023-01-24 ENCOUNTER — Encounter (HOSPITAL_COMMUNITY)
Admission: RE | Admit: 2023-01-24 | Discharge: 2023-01-24 | Disposition: A | Discharge status: Home / Self Care | Payer: 59 | Source: Ambulatory Visit | Attending: Nephrology | Admitting: Nephrology

## 2023-01-24 VITALS — BP 127/61 | HR 69 | Temp 97.1°F | Resp 17

## 2023-01-24 DIAGNOSIS — L03221 Cellulitis of neck: Secondary | ICD-10-CM | POA: Diagnosis not present

## 2023-01-24 DIAGNOSIS — D631 Anemia in chronic kidney disease: Secondary | ICD-10-CM | POA: Insufficient documentation

## 2023-01-24 DIAGNOSIS — K1120 Sialoadenitis, unspecified: Secondary | ICD-10-CM | POA: Diagnosis not present

## 2023-01-24 DIAGNOSIS — N184 Chronic kidney disease, stage 4 (severe): Secondary | ICD-10-CM | POA: Insufficient documentation

## 2023-01-24 LAB — POCT HEMOGLOBIN-HEMACUE: Hemoglobin: 10.5 g/dL — ABNORMAL LOW (ref 12.0–15.0)

## 2023-01-24 LAB — IRON AND TIBC
Iron: 47 ug/dL (ref 28–170)
Saturation Ratios: 24 % (ref 10.4–31.8)
TIBC: 196 ug/dL — ABNORMAL LOW (ref 250–450)
UIBC: 149 ug/dL

## 2023-01-24 LAB — FERRITIN: Ferritin: 140 ng/mL (ref 11–307)

## 2023-01-24 MED ORDER — EPOETIN ALFA-EPBX 10000 UNIT/ML IJ SOLN
INTRAMUSCULAR | Status: AC
  Filled 2023-01-24: qty 2

## 2023-01-24 MED ORDER — EPOETIN ALFA-EPBX 10000 UNIT/ML IJ SOLN
20000.0000 [IU] | INTRAMUSCULAR | Status: DC
  Administered 2023-01-24: 20000 [IU] via SUBCUTANEOUS

## 2023-01-26 ENCOUNTER — Inpatient Hospital Stay (HOSPITAL_COMMUNITY): Payer: 59

## 2023-01-26 ENCOUNTER — Other Ambulatory Visit: Payer: Self-pay

## 2023-01-26 ENCOUNTER — Emergency Department (HOSPITAL_COMMUNITY): Payer: 59

## 2023-01-26 ENCOUNTER — Inpatient Hospital Stay (HOSPITAL_COMMUNITY)
Admission: EM | Admit: 2023-01-26 | Discharge: 2023-01-31 | DRG: 602 | Disposition: A | Discharge status: Home Health | Payer: 59 | Attending: Internal Medicine | Admitting: Internal Medicine

## 2023-01-26 ENCOUNTER — Encounter (HOSPITAL_COMMUNITY): Payer: Self-pay

## 2023-01-26 DIAGNOSIS — M8580 Other specified disorders of bone density and structure, unspecified site: Secondary | ICD-10-CM | POA: Diagnosis present

## 2023-01-26 DIAGNOSIS — F32A Depression, unspecified: Secondary | ICD-10-CM | POA: Diagnosis present

## 2023-01-26 DIAGNOSIS — I10 Essential (primary) hypertension: Secondary | ICD-10-CM | POA: Diagnosis present

## 2023-01-26 DIAGNOSIS — Z803 Family history of malignant neoplasm of breast: Secondary | ICD-10-CM

## 2023-01-26 DIAGNOSIS — I129 Hypertensive chronic kidney disease with stage 1 through stage 4 chronic kidney disease, or unspecified chronic kidney disease: Secondary | ICD-10-CM | POA: Diagnosis present

## 2023-01-26 DIAGNOSIS — D631 Anemia in chronic kidney disease: Secondary | ICD-10-CM | POA: Diagnosis present

## 2023-01-26 DIAGNOSIS — Z8542 Personal history of malignant neoplasm of other parts of uterus: Secondary | ICD-10-CM | POA: Diagnosis not present

## 2023-01-26 DIAGNOSIS — Z801 Family history of malignant neoplasm of trachea, bronchus and lung: Secondary | ICD-10-CM | POA: Diagnosis not present

## 2023-01-26 DIAGNOSIS — Z79899 Other long term (current) drug therapy: Secondary | ICD-10-CM | POA: Diagnosis not present

## 2023-01-26 DIAGNOSIS — E1122 Type 2 diabetes mellitus with diabetic chronic kidney disease: Secondary | ICD-10-CM | POA: Diagnosis present

## 2023-01-26 DIAGNOSIS — E876 Hypokalemia: Secondary | ICD-10-CM | POA: Diagnosis present

## 2023-01-26 DIAGNOSIS — L03221 Cellulitis of neck: Secondary | ICD-10-CM | POA: Diagnosis present

## 2023-01-26 DIAGNOSIS — N184 Chronic kidney disease, stage 4 (severe): Secondary | ICD-10-CM | POA: Insufficient documentation

## 2023-01-26 DIAGNOSIS — N189 Chronic kidney disease, unspecified: Secondary | ICD-10-CM | POA: Diagnosis present

## 2023-01-26 DIAGNOSIS — N179 Acute kidney failure, unspecified: Secondary | ICD-10-CM | POA: Insufficient documentation

## 2023-01-26 DIAGNOSIS — Z853 Personal history of malignant neoplasm of breast: Secondary | ICD-10-CM | POA: Diagnosis not present

## 2023-01-26 DIAGNOSIS — E1165 Type 2 diabetes mellitus with hyperglycemia: Secondary | ICD-10-CM | POA: Diagnosis present

## 2023-01-26 DIAGNOSIS — Z87891 Personal history of nicotine dependence: Secondary | ICD-10-CM | POA: Diagnosis not present

## 2023-01-26 DIAGNOSIS — Z800 Family history of malignant neoplasm of digestive organs: Secondary | ICD-10-CM | POA: Diagnosis not present

## 2023-01-26 DIAGNOSIS — E119 Type 2 diabetes mellitus without complications: Secondary | ICD-10-CM

## 2023-01-26 DIAGNOSIS — L03211 Cellulitis of face: Secondary | ICD-10-CM | POA: Diagnosis present

## 2023-01-26 DIAGNOSIS — K1120 Sialoadenitis, unspecified: Principal | ICD-10-CM | POA: Diagnosis present

## 2023-01-26 DIAGNOSIS — E7800 Pure hypercholesterolemia, unspecified: Secondary | ICD-10-CM | POA: Diagnosis present

## 2023-01-26 DIAGNOSIS — Z8249 Family history of ischemic heart disease and other diseases of the circulatory system: Secondary | ICD-10-CM | POA: Diagnosis not present

## 2023-01-26 DIAGNOSIS — L0390 Cellulitis, unspecified: Secondary | ICD-10-CM | POA: Diagnosis present

## 2023-01-26 DIAGNOSIS — E860 Dehydration: Secondary | ICD-10-CM | POA: Diagnosis present

## 2023-01-26 DIAGNOSIS — K279 Peptic ulcer, site unspecified, unspecified as acute or chronic, without hemorrhage or perforation: Secondary | ICD-10-CM | POA: Diagnosis present

## 2023-01-26 DIAGNOSIS — C50411 Malignant neoplasm of upper-outer quadrant of right female breast: Secondary | ICD-10-CM | POA: Diagnosis present

## 2023-01-26 DIAGNOSIS — D509 Iron deficiency anemia, unspecified: Secondary | ICD-10-CM | POA: Diagnosis present

## 2023-01-26 DIAGNOSIS — G9341 Metabolic encephalopathy: Secondary | ICD-10-CM | POA: Insufficient documentation

## 2023-01-26 LAB — CBC WITH DIFFERENTIAL/PLATELET
Abs Immature Granulocytes: 0.06 10*3/uL (ref 0.00–0.07)
Basophils Absolute: 0 10*3/uL (ref 0.0–0.1)
Basophils Relative: 0 %
Eosinophils Absolute: 0 10*3/uL (ref 0.0–0.5)
Eosinophils Relative: 0 %
HCT: 32.3 % — ABNORMAL LOW (ref 36.0–46.0)
Hemoglobin: 10.3 g/dL — ABNORMAL LOW (ref 12.0–15.0)
Immature Granulocytes: 1 %
Lymphocytes Relative: 8 %
Lymphs Abs: 1.1 10*3/uL (ref 0.7–4.0)
MCH: 32.3 pg (ref 26.0–34.0)
MCHC: 31.9 g/dL (ref 30.0–36.0)
MCV: 101.3 fL — ABNORMAL HIGH (ref 80.0–100.0)
Monocytes Absolute: 1.7 10*3/uL — ABNORMAL HIGH (ref 0.1–1.0)
Monocytes Relative: 13 %
Neutro Abs: 10.2 10*3/uL — ABNORMAL HIGH (ref 1.7–7.7)
Neutrophils Relative %: 78 %
Platelets: 171 10*3/uL (ref 150–400)
RBC: 3.19 MIL/uL — ABNORMAL LOW (ref 3.87–5.11)
RDW: 14.7 % (ref 11.5–15.5)
WBC: 13 10*3/uL — ABNORMAL HIGH (ref 4.0–10.5)
nRBC: 0 % (ref 0.0–0.2)

## 2023-01-26 LAB — CBC
HCT: 32.7 % — ABNORMAL LOW (ref 36.0–46.0)
Hemoglobin: 10.2 g/dL — ABNORMAL LOW (ref 12.0–15.0)
MCH: 31.9 pg (ref 26.0–34.0)
MCHC: 31.2 g/dL (ref 30.0–36.0)
MCV: 102.2 fL — ABNORMAL HIGH (ref 80.0–100.0)
Platelets: 175 10*3/uL (ref 150–400)
RBC: 3.2 MIL/uL — ABNORMAL LOW (ref 3.87–5.11)
RDW: 14.6 % (ref 11.5–15.5)
WBC: 14.1 10*3/uL — ABNORMAL HIGH (ref 4.0–10.5)
nRBC: 0 % (ref 0.0–0.2)

## 2023-01-26 LAB — URINALYSIS, ROUTINE W REFLEX MICROSCOPIC
Bacteria, UA: NONE SEEN
Bilirubin Urine: NEGATIVE
Glucose, UA: NEGATIVE mg/dL
Hgb urine dipstick: NEGATIVE
Ketones, ur: NEGATIVE mg/dL
Leukocytes,Ua: NEGATIVE
Nitrite: NEGATIVE
Protein, ur: 100 mg/dL — AB
Specific Gravity, Urine: 1.011 (ref 1.005–1.030)
pH: 5 (ref 5.0–8.0)

## 2023-01-26 LAB — COMPREHENSIVE METABOLIC PANEL
ALT: 8 U/L (ref 0–44)
AST: 11 U/L — ABNORMAL LOW (ref 15–41)
Albumin: 3.1 g/dL — ABNORMAL LOW (ref 3.5–5.0)
Alkaline Phosphatase: 45 U/L (ref 38–126)
Anion gap: 16 — ABNORMAL HIGH (ref 5–15)
BUN: 124 mg/dL — ABNORMAL HIGH (ref 8–23)
CO2: 22 mmol/L (ref 22–32)
Calcium: 9.4 mg/dL (ref 8.9–10.3)
Chloride: 101 mmol/L (ref 98–111)
Creatinine, Ser: 4.91 mg/dL — ABNORMAL HIGH (ref 0.44–1.00)
GFR, Estimated: 8 mL/min — ABNORMAL LOW (ref >60)
Glucose, Bld: 131 mg/dL — ABNORMAL HIGH (ref 70–99)
Potassium: 3.4 mmol/L — ABNORMAL LOW (ref 3.5–5.1)
Sodium: 139 mmol/L (ref 135–145)
Total Bilirubin: 0.8 mg/dL (ref 0.3–1.2)
Total Protein: 6.7 g/dL (ref 6.5–8.1)

## 2023-01-26 LAB — LACTIC ACID, PLASMA
Lactic Acid, Venous: 0.8 mmol/L (ref 0.5–1.9)
Lactic Acid, Venous: 0.7 mmol/L (ref 0.5–1.9)

## 2023-01-26 LAB — APTT: aPTT: 41 seconds — ABNORMAL HIGH (ref 24–36)

## 2023-01-26 LAB — HEMOGLOBIN A1C
Hgb A1c MFr Bld: 4.4 % — ABNORMAL LOW (ref 4.8–5.6)
Mean Plasma Glucose: 79.58 mg/dL

## 2023-01-26 LAB — PROTIME-INR
INR: 1.2 (ref 0.8–1.2)
Prothrombin Time: 15.1 seconds (ref 11.4–15.2)

## 2023-01-26 LAB — CREATININE, SERUM
Creatinine, Ser: 4.6 mg/dL — ABNORMAL HIGH (ref 0.44–1.00)
GFR, Estimated: 9 mL/min — ABNORMAL LOW (ref >60)

## 2023-01-26 MED ORDER — DICLOFENAC SODIUM 1 % EX GEL: 4.0000 g | Freq: Four times a day (QID) | CUTANEOUS | Status: DC | PRN

## 2023-01-26 MED ORDER — LACTATED RINGERS IV BOLUS
500.0000 mL | Freq: Once | INTRAVENOUS | Status: AC
  Administered 2023-01-26: 500 mL via INTRAVENOUS

## 2023-01-26 MED ORDER — OYSTER SHELL CALCIUM/D3 500-5 MG-MCG PO TABS
1.0000 | ORAL_TABLET | Freq: Every day | ORAL | Status: DC
  Administered 2023-01-26 – 2023-01-31 (×6): 1 via ORAL
  Filled 2023-01-26 (×7): qty 1

## 2023-01-26 MED ORDER — LACTATED RINGERS IV SOLN: INTRAVENOUS | Status: AC

## 2023-01-26 MED ORDER — AMLODIPINE BESYLATE 10 MG PO TABS
10.0000 mg | ORAL_TABLET | Freq: Every day | ORAL | Status: DC
  Administered 2023-01-26 – 2023-01-28 (×3): 10 mg via ORAL
  Filled 2023-01-26: qty 1
  Filled 2023-01-26: qty 2
  Filled 2023-01-26: qty 1

## 2023-01-26 MED ORDER — VITAMIN D 25 MCG (1000 UNIT) PO TABS
1000.0000 [IU] | ORAL_TABLET | Freq: Every day | ORAL | Status: DC
  Administered 2023-01-26 – 2023-01-31 (×6): 1000 [IU] via ORAL
  Filled 2023-01-26 (×6): qty 1

## 2023-01-26 MED ORDER — SERTRALINE HCL 100 MG PO TABS
100.0000 mg | ORAL_TABLET | Freq: Every day | ORAL | Status: DC
  Administered 2023-01-26 – 2023-01-31 (×6): 100 mg via ORAL
  Filled 2023-01-26 (×6): qty 1

## 2023-01-26 MED ORDER — MORPHINE SULFATE (PF) 2 MG/ML IV SOLN: 1.0000 mg | INTRAVENOUS | Status: DC | PRN

## 2023-01-26 MED ORDER — SENNOSIDES-DOCUSATE SODIUM 8.6-50 MG PO TABS: 1.0000 | ORAL_TABLET | Freq: Every evening | ORAL | Status: DC | PRN

## 2023-01-26 MED ORDER — FERROUS SULFATE 325 (65 FE) MG PO TABS
325.0000 mg | ORAL_TABLET | Freq: Every day | ORAL | Status: DC
  Administered 2023-01-26 – 2023-01-31 (×6): 325 mg via ORAL
  Filled 2023-01-26 (×6): qty 1

## 2023-01-26 MED ORDER — HYDRALAZINE HCL 20 MG/ML IJ SOLN: 10.0000 mg | Freq: Four times a day (QID) | INTRAMUSCULAR | Status: DC | PRN

## 2023-01-26 MED ORDER — ONDANSETRON HCL 4 MG/2ML IJ SOLN
4.0000 mg | Freq: Once | INTRAMUSCULAR | Status: AC
  Administered 2023-01-26: 4 mg via INTRAVENOUS
  Filled 2023-01-26: qty 2

## 2023-01-26 MED ORDER — ACETAMINOPHEN 325 MG PO TABS
650.0000 mg | ORAL_TABLET | Freq: Four times a day (QID) | ORAL | Status: DC | PRN
  Administered 2023-01-28 – 2023-01-30 (×3): 650 mg via ORAL
  Filled 2023-01-26 (×2): qty 2

## 2023-01-26 MED ORDER — FENTANYL CITRATE PF 50 MCG/ML IJ SOSY
50.0000 ug | PREFILLED_SYRINGE | Freq: Once | INTRAMUSCULAR | Status: AC
  Administered 2023-01-26: 50 ug via INTRAVENOUS
  Filled 2023-01-26: qty 1

## 2023-01-26 MED ORDER — MIRTAZAPINE 15 MG PO TABS
30.0000 mg | ORAL_TABLET | Freq: Every day | ORAL | Status: DC
  Administered 2023-01-26 – 2023-01-30 (×5): 30 mg via ORAL
  Filled 2023-01-26 (×5): qty 2

## 2023-01-26 MED ORDER — ALBUTEROL SULFATE (2.5 MG/3ML) 0.083% IN NEBU: 2.5000 mg | INHALATION_SOLUTION | RESPIRATORY_TRACT | Status: DC | PRN

## 2023-01-26 MED ORDER — ATORVASTATIN CALCIUM 40 MG PO TABS
40.0000 mg | ORAL_TABLET | Freq: Every day | ORAL | Status: DC
  Administered 2023-01-26 – 2023-01-31 (×6): 40 mg via ORAL
  Filled 2023-01-26 (×6): qty 1

## 2023-01-26 MED ORDER — HYDRALAZINE HCL 50 MG PO TABS
50.0000 mg | ORAL_TABLET | Freq: Three times a day (TID) | ORAL | Status: DC
  Administered 2023-01-26 – 2023-01-31 (×14): 50 mg via ORAL
  Filled 2023-01-26 (×9): qty 1
  Filled 2023-01-26: qty 2
  Filled 2023-01-26 (×4): qty 1

## 2023-01-26 MED ORDER — VITAMIN C 500 MG PO TABS
500.0000 mg | ORAL_TABLET | Freq: Every day | ORAL | Status: DC
  Administered 2023-01-26 – 2023-01-31 (×6): 500 mg via ORAL
  Filled 2023-01-26 (×6): qty 1

## 2023-01-26 MED ORDER — VANCOMYCIN HCL IN DEXTROSE 1-5 GM/200ML-% IV SOLN
1000.0000 mg | Freq: Once | INTRAVENOUS | Status: AC
  Administered 2023-01-26: 1000 mg via INTRAVENOUS
  Filled 2023-01-26: qty 200

## 2023-01-26 MED ORDER — ENOXAPARIN SODIUM 30 MG/0.3ML IJ SOSY
30.0000 mg | PREFILLED_SYRINGE | Freq: Every day | INTRAMUSCULAR | Status: DC
  Administered 2023-01-26 – 2023-01-31 (×6): 30 mg via SUBCUTANEOUS
  Filled 2023-01-26 (×6): qty 0.3

## 2023-01-26 MED ORDER — CLINDAMYCIN PHOSPHATE 600 MG/50ML IV SOLN
600.0000 mg | Freq: Three times a day (TID) | INTRAVENOUS | Status: DC
  Administered 2023-01-26 – 2023-01-30 (×13): 600 mg via INTRAVENOUS
  Filled 2023-01-26 (×15): qty 50

## 2023-01-26 MED ORDER — PANTOPRAZOLE SODIUM 40 MG PO TBEC
40.0000 mg | DELAYED_RELEASE_TABLET | Freq: Every day | ORAL | Status: DC
  Administered 2023-01-26 – 2023-01-31 (×6): 40 mg via ORAL
  Filled 2023-01-26 (×6): qty 1

## 2023-01-26 MED ORDER — LACTATED RINGERS IV BOLUS (SEPSIS)
500.0000 mL | Freq: Once | INTRAVENOUS | Status: AC
  Administered 2023-01-26: 500 mL via INTRAVENOUS

## 2023-01-26 NOTE — ED Notes (Signed)
Pt resting in bed with family member at bedside. RR present even and unlabored. Care ongoing.

## 2023-01-26 NOTE — ED Notes (Signed)
Messaged pharmacy to request clindamycin to be tubed down.

## 2023-01-26 NOTE — H&P (Addendum)
History and Physical    DOA: 01/26/2023  PCP: Nolene Ebbs, MD  Patient coming from: home  Chief Complaint: AMS, rt face swelling  HPI: Erica Whitney is a 82 y.o. female with history of breast cancer, CKD, anemia of chronic disease, peptic ulcer disease, borderline type 2 diabetes brought in by daughter after noticing right mandibular swelling and redness.  Patient apparently lives alone and quite independent as baseline.  Daughter checks on her frequently.  Per daughter, patient apparently was tolerating regular diet over the weekend.  However over the last 3 to 4 days she has been taking only liquids-soup and Glucerna mainly-due to pain along her right jaw.  Daughter noticed yesterday that her right side of the face was swollen extending to the neck and patient also appeared to be confused which prompted ED visit.  Patient does have poor dentition but according to daughter, never seen a dentist.  She did not have any recent procedures.  She does get iron infusions through her primary nephrologist and the last infusion was on Wednesday. ED course: Afebrile, BP 142/62-162/62, pulse 80s, O2 sat 95% on room air-placed on 2 L?  For comfort.  CBC showed WBC of 13,000, hemoglobin 10.3, sodium 139, potassium 3.4, BUN 124 (baseline 50s), creatinine 4.91 (baseline around 3-3.4), calcium 9.4, INR 1.2, glucose 131, lactate 0.7-0.8, albumin 3.1, LFTs normal. Chest x-ray unremarkable.  Due to altered mental status, patient underwent CT head WNL.  Noncontrasted CT of the face and soft tissue neck reported as marked inflammatory process throughout the right neck concerning for deep space infection versus right IJ venous thrombosis.  EDP recommended contrasted study but due to renal function this was deferred.  Bedside ultrasound of right IJ by EDP showed compressibility of right IJ from the level of clavicle to the right ear with no concerns for acute DVT.  Patient requested to be admitted for further  evaluation and management.  Review of Systems: As per HPI, otherwise review of systems negative.    Past Medical History:  Diagnosis Date   Anemia in chronic kidney disease (CKD)    Arthritis    Breast cancer ([place])    Breast cancer of upper-outer quadrant of right female breast ([place]) 11/06/2014   Chronic kidney disease (CKD), stage IV (severe) ([facility])    Depression    Diabetes mellitus    Hypercholesteremia    Hypertension    Insomnia    Peptic ulcer    Uterine cancer ([place])    dx in her 79s   Wears glasses     Past Surgical History:  Procedure Laterality Date   ABDOMINAL HYSTERECTOMY  1995   BACK SURGERY  2000   lumb lam   BIOPSY  05/14/2021   Procedure: BIOPSY;  Surgeon: Doran Stabler, MD;  Location: [facility] ENDOSCOPY;  Service: Gastroenterology;;   BREAST LUMPECTOMY Right 2015   COLONOSCOPY     ESOPHAGOGASTRODUODENOSCOPY N/A 05/14/2021   Procedure: ESOPHAGOGASTRODUODENOSCOPY (EGD);  Surgeon: Doran Stabler, MD;  Location: [place];  Service: Gastroenterology;  Laterality: N/A;   ESOPHAGOGASTRODUODENOSCOPY (EGD) WITH PROPOFOL N/A 05/26/2021   Procedure: ESOPHAGOGASTRODUODENOSCOPY (EGD) WITH PROPOFOL;  Surgeon: Carol Ada, MD;  Location: [place];  Service: Endoscopy;  Laterality: N/A;   HEMOSTASIS CLIP PLACEMENT  05/26/2021   Procedure: HEMOSTASIS CLIP PLACEMENT;  Surgeon: Carol Ada, MD;  Location: [place];  Service: Endoscopy;;   HEMOSTASIS CONTROL  05/14/2021   Procedure: HEMOSTASIS CONTROL;  Surgeon: Doran Stabler, MD;  Location: [facility]  ENDOSCOPY;  Service: Gastroenterology;;   HOT HEMOSTASIS N/A 05/14/2021   Procedure: HOT HEMOSTASIS (ARGON PLASMA COAGULATION/BICAP);  Surgeon: Doran Stabler, MD;  Location: [place];  Service: Gastroenterology;  Laterality: N/A;   HOT HEMOSTASIS N/A 05/26/2021   Procedure: HOT HEMOSTASIS (ARGON PLASMA COAGULATION/BICAP);  Surgeon: Carol Ada, MD;  Location: [place];  Service: Endoscopy;  Laterality: N/A;    ORIF METACARPAL FRACTURE  2012   left   RADIOACTIVE SEED GUIDED PARTIAL MASTECTOMY WITH AXILLARY SENTINEL LYMPH NODE BIOPSY Right 12/17/2014   Procedure: RIGHT BREAST RADIOACTIVE SEED LOCALIZED LUMPECTOMY AND SENTINEL NODE MAPPING;  Surgeon: Autumn Messing III, MD;  Location: [place];  Service: General;  Laterality: Right;   SCLEROTHERAPY  05/26/2021   Procedure: Clide Deutscher;  Surgeon: Carol Ada, MD;  Location: [facility] ENDOSCOPY;  Service: Endoscopy;;    Social history:  reports that she quit smoking about 11 years ago. Her smoking use included cigarettes. She smoked an average of .25 packs per day. She has quit using smokeless tobacco.  Her smokeless tobacco use included snuff. She reports that she does not drink alcohol and does not use drugs.   No Known Allergies  Family History  Problem Relation Age of Onset   Cancer Mother    Stomach cancer Mother 66   Heart attack Father    Cancer Brother    Lung cancer Brother 6   Breast cancer Sister 59      Prior to Admission medications   Medication Sig Start Date End Date Taking? Authorizing Provider  ACCU-CHEK GUIDE test strip qd 03/12/17   [provider]  AIMSCO INSULIN SYR ULTRA THIN 31G X 5/16" 0.3 ML MISC  03/12/17   [provider]  amLODipine (NORVASC) 10 MG tablet Take 1 tablet (10 mg total) by mouth daily. 05/17/21   Mercy Riding, MD  atorvastatin (LIPITOR) 40 MG tablet Take 40 mg by mouth daily.    [provider]  Blood Glucose Calibration (ACCU-CHEK GUIDE CONTROL) LIQD  03/12/17   [provider]  Calcium Carbonate-Vitamin D 600-400 MG-UNIT tablet Take 1 tablet by mouth daily.    [provider]  cetirizine (ZYRTEC) 10 MG tablet Take 10 mg by mouth daily as needed for allergies. 04/14/21   [provider]  cholecalciferol (VITAMIN D3) 25 MCG (1000 UNIT) tablet Take 1,000 Units by mouth daily.    [provider]  cycloSPORINE (RESTASIS) 0.05 % ophthalmic  emulsion Place 1 drop into both eyes 2 (two) times daily.    [provider]  diclofenac Sodium (VOLTAREN) 1 % GEL Apply 4 g topically 4 (four) times daily as needed (knee/leg pain). 04/23/21   [provider]  ferrous sulfate 325 (65 FE) MG tablet Take 1 tablet (325 mg total) by mouth daily with breakfast. 05/31/21   Amin, Jeanella Flattery, MD  furosemide (LASIX) 40 MG tablet Take 1 tablet (40 mg total) by mouth daily as needed for fluid (ankle swelling). 05/23/21   Mercy Riding, MD  gabapentin (NEURONTIN) 100 MG capsule Take 100 mg by mouth 2 (two) times daily. 03/23/21   [provider]  hydrALAZINE (APRESOLINE) 50 MG tablet Take 1 tablet (50 mg total) by mouth 3 (three) times daily. 05/16/21   Mercy Riding, MD  mirtazapine (REMERON) 30 MG tablet Take 30 mg by mouth at bedtime. 04/07/21   [provider]  pantoprazole (PROTONIX) 40 MG tablet Take 1 tablet (40 mg total) by mouth 2 (two) times daily before  a meal for 30 days, THEN 1 tablet (40 mg total) daily. 05/16/21 08/14/21  Mercy Riding, MD  Prenatal Vit-Fe Fumarate-FA (PRENATAL PO) Take 1 tablet by mouth daily.    [provider]  senna-docusate (SENOKOT-S) 8.6-50 MG tablet Take 1 tablet by mouth at bedtime as needed for mild constipation or moderate constipation. 05/30/21   Amin, Jeanella Flattery, MD  sertraline (ZOLOFT) 100 MG tablet Take 100 mg by mouth every morning.    [provider]  triamcinolone cream (KENALOG) 0.1 % Apply 1 application topically 2 (two) times daily as needed (rash). 12/21/20   [provider]  vitamin C (ASCORBIC ACID) 500 MG tablet Take 500 mg by mouth daily.    [provider]    Physical Exam: Vitals:   01/26/23 0830 01/26/23 0837 01/26/23 0838 01/26/23 0839  BP: (!) 166/70     Pulse: 78 76 78 76  Resp: (!) 21 20 17 15  $ Temp:      TempSrc:      SpO2: (S) (!) 89% 93% 96% 93%  Weight:      Height:        Constitutional: Patient resting and in no acute  distress, appears drowsy and uncomfortable due to right jaw pain while talking Eyes: PERRL, lids and conjunctivae normal ENMT: Right preauricular/mandibular swelling with redness extending somewhat into the right side of neck.  Point of maximum tenderness along right mandibular area below the ear.  Poor dentition with cracked front teeth- difficult to examine posterior oral cavity due to her inability to open her mouth wide or follow commands due to AMS.  Might have some drainage along buccal mucosa-?  From salivary duct Neck: normal, supple, no masses, no thyromegaly Respiratory: clear to auscultation bilaterally, no wheezing, no crackles. Normal respiratory effort. No accessory muscle use.  Cardiovascular: Regular rate and rhythm, no murmurs / rubs / gallops. No extremity edema. 2+ pedal pulses. No carotid bruits.  Abdomen: no tenderness, no masses palpated. No hepatosplenomegaly. Bowel sounds positive.  Musculoskeletal: no clubbing / cyanosis. No joint deformity upper and lower extremities. Good ROM, no contractures. Normal muscle tone.  Neurologic: Patient is drowsy but able to tell me the month, year, able to recall her name and her daughter's name.  She did tell me she was at [facility] for right face swelling and pain but could not recollect her address.  CN 2-12 grossly intact. Sensation intact, DTR normal. Strength 4/5 in all 4.  Psychiatric: Normal judgment and insight. Alert and oriented x 3 as described above. Normal mood.  SKIN/catheters: Swelling and redness along right side of her face as discussed above.  Labs on Admission: I have personally reviewed following labs and imaging studies  CBC: Recent Labs  Lab 01/24/23 0807 01/26/23 0250  WBC  --  13.0*  NEUTROABS  --  10.2*  HGB 10.5* 10.3*  HCT  --  32.3*  MCV  --  101.3*  PLT  --  [id number]   Basic Metabolic Panel: Recent Labs  Lab 01/26/23 0250  NA 139  K 3.4*  CL 101  CO2 22  GLUCOSE 131*  BUN 124*  CREATININE 4.91*   CALCIUM 9.4   GFR: Estimated Creatinine Clearance: 8.6 mL/min (A) (by C-G formula based on SCr of 4.91 mg/dL (H)). Recent Labs  Lab 01/26/23 0250 01/26/23 0556  WBC 13.0*  --   LATICACIDVEN 0.8 0.7   Liver Function Tests: Recent Labs  Lab 01/26/23 0250  AST 11*  ALT  8  ALKPHOS 45  BILITOT 0.8  PROT 6.7  ALBUMIN 3.1*   No results for input(s): "LIPASE", "AMYLASE" in the last 168 hours. No results for input(s): "AMMONIA" in the last 168 hours. Coagulation Profile: Recent Labs  Lab 01/26/23 0250  INR 1.2   Cardiac Enzymes: No results for input(s): "CKTOTAL", "CKMB", "CKMBINDEX", "TROPONINI" in the last 168 hours. BNP (last 3 results) No results for input(s): "PROBNP" in the last 8760 hours. HbA1C: No results for input(s): "HGBA1C" in the last 72 hours. CBG: No results for input(s): "GLUCAP" in the last 168 hours. Lipid Profile: No results for input(s): "CHOL", "HDL", "LDLCALC", "TRIG", "CHOLHDL", "LDLDIRECT" in the last 72 hours. Thyroid Function Tests: No results for input(s): "TSH", "T4TOTAL", "FREET4", "T3FREE", "THYROIDAB" in the last 72 hours. Anemia Panel: Recent Labs    01/24/23 0803  FERRITIN 140  TIBC 196*  IRON 47   Urine analysis:    Component Value Date/Time   COLORURINE YELLOW 01/26/2023 0815   APPEARANCEUR CLEAR 01/26/2023 0815   LABSPEC 1.011 01/26/2023 0815   PHURINE 5.0 01/26/2023 0815   GLUCOSEU NEGATIVE 01/26/2023 0815   HGBUR NEGATIVE 01/26/2023 0815   BILIRUBINUR NEGATIVE 01/26/2023 0815   KETONESUR NEGATIVE 01/26/2023 0815   PROTEINUR 100 (A) 01/26/2023 0815   UROBILINOGEN 0.2 01/30/2012 1215   NITRITE NEGATIVE 01/26/2023 0815   LEUKOCYTESUR NEGATIVE 01/26/2023 0815    Radiological Exams on Admission: Personally reviewed  CT Maxillofacial Wo Contrast  Result Date: 01/26/2023 CLINICAL DATA:  82 year old female with delirium, redness and swelling at the right upper jaw, neck soft tissue swelling and induration with pain. The  patient has baseline renal insufficiency (baseline creatinine of about 3) and further elevated creatinine in now to 5. IV contrast was deferred. EXAM: CT NECK WITHOUT CONTRAST CT MAXILLOFACIAL WITHOUT CONTRAST TECHNIQUE: Multidetector CT imaging of the neck, face is most was performed following the standard protocol without intravenous contrast. RADIATION DOSE REDUCTION: This exam was performed according to the departmental dose-optimization program which includes automated exposure control, adjustment of the mA and/or kV according to patient size and/or use of iterative reconstruction technique. COMPARISON:  Head CT today reported separately. FINDINGS: Pharynx and larynx: Inflammation throughout the right parapharyngeal space, least affecting the level of the nasopharynx. Asymmetry of the pharynx and supraglottic larynx with evidence of swollen right lateral pharyngeal wall. But no obvious tonsillar enlargement. Epiglottis remains within normal limits. Fluid and/or inflammation throughout the retropharyngeal space, most apparent from the C2 to the C4 cervical level as seen on sagittal image 46 of neck series 5. This is contiguous with both carotid spaces. The left parapharyngeal space is relatively normal. Salivary glands: Noncontrast sublingual space seems to remain within normal limits. Diffuse soft tissue swelling and inflammation throughout the right submandibular space, right submandibular gland is largely obscured. And subcutaneous soft tissue inflammation tracks across midline to the left at the level of the hyoid and submental soft tissues, and the left submandibular space and gland also seems somewhat inflamed. Diffusely enlarged and inflamed right parotid gland. Widespread regional subcutaneous inflammation, thickening of the platysma, and inflammation in the regional right neck lymph node stations. Contralateral left parotid gland appears within normal limits. No sialolithiasis identified. No dilatation  of the right parotid duct is evident. Thyroid: Heterogeneous.  No significant regional mass effect. Lymph nodes: Inflammation throughout the right neck lymph node stations and small but reactive appearing right level 1, level 2, and level 3 lymph nodes. Evaluation for cystic or necrotic node limited in the  of the lack of IV contrast. Contralateral left cervical lymph node stations remain within normal limits. Vascular: Vascular patency is not evaluated in the absence of IV contrast. And there is a conspicuous appearance of the noncontrast right internal jugular vein in the lower neck on coronal image 54 of neck series 6. Superimposed bulky calcified right carotid bifurcation atherosclerosis. Limited intracranial: Head CT is reported separately. Visualized orbits: See Head CT reported separately. Mastoids and visualized paranasal sinuses: Best demonstrated on the face CT, the bilateral tympanic cavities and mastoids are clear. Paranasal sinuses are well aerated with of left sphenoid sinus 16 mm mucous retention cyst and otherwise only mild scattered paranasal sinus mucosal thickening. Nasal cavity demonstrates symmetric mucosal thickening. Skeleton: Best demonstrated on the CT face, much of the posterior mandible and maxillary dentition is absent. The residual mostly anterior maxillary and mandible dentition is diffusely carious and poor. However, periapical lucency more affects the maxilla than the mandible, and none of these seem to be the epicenter of inflammation described above. Mandible overall intact and aligned. Superimposed advanced cervical spine degeneration. No cervical endplate erosion is evident. No acute or suspicious osseous lesion identified. Upper chest: Calcified aortic atherosclerosis. No definite superior mediastinal inflammation. Lung apices are relatively clear with subtle centrilobular emphysema. Other findings: Right periauricular soft tissue inflammation in association with the right parotid  gland findings. But the right pinna does not appear significantly swollen. The superficial portion of the right external auditory canal is inflamed and thickened, but the EAC remains patent. IMPRESSION: 1. Marked inflammatory process throughout the right neck, extending from the superficial subcutaneous tissues into the deep spaces including the right pharynx, parapharyngeal and retropharyngeal spaces. Etiology indeterminate on this noncontrast exam and differential considerations fairly broad, including Severe right Sialadenitis, complicated Pharyngitis, complicated Cellulitis, Retropharyngeal Infection, acute Right Internal Jugular Vein Thrombosis. Recommend a follow-up Neck CT with IV contrast when feasible. Failing that, a noncontrast Neck MRI might allow narrowing of the differential diagnosis. 2. Pertinent negatives include normal right middle ear and mastoids, no significant paranasal sinus inflammation, no obvious osteomyelitis, visible lung apices clear and poor dentition although seems not directly related to the right neck inflammation. 3. Salient findings were discussed by telephone with Erenest Rasher, PA on 01/26/2023 at 0440 hours Electronically Signed   By: Genevie Ann M.D.   On: 01/26/2023 04:58   CT Soft Tissue Neck Wo Contrast  Result Date: 01/26/2023 CLINICAL DATA:  82 year old female with delirium, redness and swelling at the right upper jaw, neck soft tissue swelling and induration with pain. The patient has baseline renal insufficiency (baseline creatinine of about 3) and further elevated creatinine in now to 5. IV contrast was deferred. EXAM: CT NECK WITHOUT CONTRAST CT MAXILLOFACIAL WITHOUT CONTRAST TECHNIQUE: Multidetector CT imaging of the neck, face is most was performed following the standard protocol without intravenous contrast. RADIATION DOSE REDUCTION: This exam was performed according to the departmental dose-optimization program which includes automated exposure control,  adjustment of the mA and/or kV according to patient size and/or use of iterative reconstruction technique. COMPARISON:  Head CT today reported separately. FINDINGS: Pharynx and larynx: Inflammation throughout the right parapharyngeal space, least affecting the level of the nasopharynx. Asymmetry of the pharynx and supraglottic larynx with evidence of swollen right lateral pharyngeal wall. But no obvious tonsillar enlargement. Epiglottis remains within normal limits. Fluid and/or inflammation throughout the retropharyngeal space, most apparent from the C2 to the C4 cervical level as seen on sagittal image 46 of neck series  5. This is contiguous with both carotid spaces. The left parapharyngeal space is relatively normal. Salivary glands: Noncontrast sublingual space seems to remain within normal limits. Diffuse soft tissue swelling and inflammation throughout the right submandibular space, right submandibular gland is largely obscured. And subcutaneous soft tissue inflammation tracks across midline to the left at the level of the hyoid and submental soft tissues, and the left submandibular space and gland also seems somewhat inflamed. Diffusely enlarged and inflamed right parotid gland. Widespread regional subcutaneous inflammation, thickening of the platysma, and inflammation in the regional right neck lymph node stations. Contralateral left parotid gland appears within normal limits. No sialolithiasis identified. No dilatation of the right parotid duct is evident. Thyroid: Heterogeneous.  No significant regional mass effect. Lymph nodes: Inflammation throughout the right neck lymph node stations and small but reactive appearing right level 1, level 2, and level 3 lymph nodes. Evaluation for cystic or necrotic node limited in the of the lack of IV contrast. Contralateral left cervical lymph node stations remain within normal limits. Vascular: Vascular patency is not evaluated in the absence of IV contrast. And there  is a conspicuous appearance of the noncontrast right internal jugular vein in the lower neck on coronal image 54 of neck series 6. Superimposed bulky calcified right carotid bifurcation atherosclerosis. Limited intracranial: Head CT is reported separately. Visualized orbits: See Head CT reported separately. Mastoids and visualized paranasal sinuses: Best demonstrated on the face CT, the bilateral tympanic cavities and mastoids are clear. Paranasal sinuses are well aerated with of left sphenoid sinus 16 mm mucous retention cyst and otherwise only mild scattered paranasal sinus mucosal thickening. Nasal cavity demonstrates symmetric mucosal thickening. Skeleton: Best demonstrated on the CT face, much of the posterior mandible and maxillary dentition is absent. The residual mostly anterior maxillary and mandible dentition is diffusely carious and poor. However, periapical lucency more affects the maxilla than the mandible, and none of these seem to be the epicenter of inflammation described above. Mandible overall intact and aligned. Superimposed advanced cervical spine degeneration. No cervical endplate erosion is evident. No acute or suspicious osseous lesion identified. Upper chest: Calcified aortic atherosclerosis. No definite superior mediastinal inflammation. Lung apices are relatively clear with subtle centrilobular emphysema. Other findings: Right periauricular soft tissue inflammation in association with the right parotid gland findings. But the right pinna does not appear significantly swollen. The superficial portion of the right external auditory canal is inflamed and thickened, but the EAC remains patent. IMPRESSION: 1. Marked inflammatory process throughout the right neck, extending from the superficial subcutaneous tissues into the deep spaces including the right pharynx, parapharyngeal and retropharyngeal spaces. Etiology indeterminate on this noncontrast exam and differential considerations fairly  broad, including Severe right Sialadenitis, complicated Pharyngitis, complicated Cellulitis, Retropharyngeal Infection, acute Right Internal Jugular Vein Thrombosis. Recommend a follow-up Neck CT with IV contrast when feasible. Failing that, a noncontrast Neck MRI might allow narrowing of the differential diagnosis. 2. Pertinent negatives include normal right middle ear and mastoids, no significant paranasal sinus inflammation, no obvious osteomyelitis, visible lung apices clear and poor dentition although seems not directly related to the right neck inflammation. 3. Salient findings were discussed by telephone with Erenest Rasher, PA on 01/26/2023 at 0440 hours Electronically Signed   By: Genevie Ann M.D.   On: 01/26/2023 04:58   CT Head Wo Contrast  Result Date: 01/26/2023 CLINICAL DATA:  82 year old female with delirium, redness and swelling at the right upper jaw, neck soft tissue swelling and induration with pain.  EXAM: CT HEAD WITHOUT CONTRAST TECHNIQUE: Contiguous axial images were obtained from the base of the skull through the vertex without intravenous contrast. RADIATION DOSE REDUCTION: This exam was performed according to the departmental dose-optimization program which includes automated exposure control, adjustment of the mA and/or kV according to patient size and/or use of iterative reconstruction technique. COMPARISON:  CT face and neck today reported separately. Head CT 09/02/2007. FINDINGS: Brain: Cerebral volume loss since 2008 appears fairly generalized. No midline shift, ventriculomegaly, mass effect, evidence of mass lesion, intracranial hemorrhage or evidence of cortically based acute infarction. Mild to moderate patchy bilateral cerebral white matter hypodensity is new or progressed since 2008. And there is heterogeneity in the right thalamus compatible with a small age indeterminate lacunar infarct there. No cortical encephalomalacia identified. Vascular: Calcified atherosclerosis at  the skull base. No suspicious intracranial vascular hyperdensity. Skull: Osteopenia. Motion artifact at the skull base. No acute osseous abnormality identified. Sinuses/Orbits: Paranasal sinuses are well aerated. There is a left sphenoid mucous retention cysts. Tympanic cavities and mastoids appear clear. Other: Calcified scalp vessel atherosclerosis. Partially visible widespread subcutaneous inflammation in the region of the right ear, tracking inferiorly. See comparison neck and face CTs. Orbits soft tissues appear negative. IMPRESSION: 1. Progressed small vessel disease since 2008, including in the right thalamus which is age indeterminate. 2. No other acute intracranial abnormality identified. 3. Partially visible widespread superficial inflammation surrounding the right ear. But right middle ear and mastoids remain clear. See comparison Neck and Face CT reported separately. Electronically Signed   By: Genevie Ann M.D.   On: 01/26/2023 04:35   DG Chest Port 1 View  Result Date: 01/26/2023 CLINICAL DATA:  Questionable sepsis EXAM: PORTABLE CHEST 1 VIEW COMPARISON:  12/11/2008 FINDINGS: Heart and mediastinal contours are within normal limits. No focal opacities or effusions. No acute bony abnormality. Aortic atherosclerosis. IMPRESSION: No active disease. Electronically Signed   By: Rolm Baptise M.D.   On: 01/26/2023 02:51    EKG: Independently reviewed.  Normal sinus rhythm with PACs, nonspecific T wave changes, QTc 474 ms     Assessment and Plan:   Principal Problem:   Cellulitis Active Problems:   HTN (hypertension)   DM2 (diabetes mellitus, type 2) (HCC)   Breast cancer of upper-outer quadrant of right female breast (HCC)   Osteopenia   PUD (peptic ulcer disease)   Anemia in chronic kidney disease (CKD)   Acute metabolic encephalopathy   AKI (acute kidney injury) ([place])    1.Right facial swelling/redness: Afebrile but has leukocytosis 13 K with CT concerning for severe inflammatory process  in pharyngeal/retropharngeal spaces and in neck.  Differentials include cellulitis versus va deep pharyngeal infection vs dental infection versus sialadenitis versus mastoiditis/parotitis.  Will admit with IV clindamycin and consult ENT.  Bedside ultrasound of right IJ by overnight EDP as mentioned above.  Will await ENT recommendations before ordering further imaging test.  Blood cultures sent.  Lactate normal.  Will obtain speech evaluation for diet recommendations given limitations on chewing and swallowing ability.  Pain medications as needed-watch for sedation.  2.   AKI on CKD stage IV: Patient's baseline BUN appears to be around 50s and creatinine around 3-3.4.  Currently patient uremic with BUN 124 and creatinine 4.91 likely prerenal secondary to poor oral intake and dehydration.  Patient received IV fluids in the ED.  Will order maintenance fluids and consult primary nephrologist.  Will avoid contrasted studies for now.  Avoid NSAIDs and nephrotoxic agents.  3.  Altered  mental status: acute metabolic encephalopathy likely secondary to problem #1 and problem #2.  Patient currently drowsy but was able to answer orientation questions fairly well.  Discussed with daughter who stated patient is quite functional at baseline and lives independently.  Will monitor progress with IV fluids and IV antibiotics.  Hold home medications-Neurontin.  Cautious using sedatives.  Will consult primary nephrologist  4.  Iron deficiency/anemia of chronic disease: Patient follows nephrology clinic and gets periodic iron infusions.  Her hemoglobin currently stable at baseline around 10.  5.  Hypertension: Currently BP uncontrolled likely due to inability to take meds.  Will resume home medications as daughter states patient been able to take liquids and meds.  Will have IV hydralazine as needed available.  6. Diabetes mellitus: Patient apparently was on metformin previously but taken off this medication more than a year  back.  Patient eats regular diet at home.  Blood glucose here has been stable around 130s on BMP check.  Will check hemoglobin A1c and order Accu-Cheks/SSI if elevated.  7.  Peptic ulcer disease: Resume PPI  8.  Breast cancer history: S/p right lumpectomy for stage IA malignancy in 2015 and several recent mammograms WNL (follows [facility])  DVT prophylaxis: Lovenox-renal dose   Code Status: Full CODE STATUS as confirmed with daughter.Health care proxy would be daughter Nira Conn  Patient/Family Communication: Discussed with patient and daughter, all questions answered to satisfaction.  Consults called: ENT-Dr Janace Hoard, nephrology (follows Dr Royce Macadamia, paged Dr Johnney Ou) Admission status :I certify that at the point of admission it is my clinical judgment that the patient will require inpatient hospital care spanning beyond 2 midnights from the point of admission due to high intensity of service and high frequency of surveillance required.Inpatient status is judged to be reasonable and necessary in order to provide the required intensity of service to ensure the patient's safety. The patient's presenting symptoms, physical exam findings, and initial radiographic and laboratory data in the context of their chronic comorbidities is felt to place them at high risk for further clinical deterioration. The following factors support the patient status of inpatient : Severe right facial/mandibular infection with altered mental status and AKI necessitating IV antibiotics     Guilford Shi MD [facility] Pager in [place]  If 7PM-7AM, please contact night-coverage [email or web address]   01/26/2023, 8:41 AM

## 2023-01-26 NOTE — ED Provider Notes (Signed)
[facility] Provider Note   CSN: [id number] Arrival date & time: 01/26/23  0106     History  Chief Complaint  Patient presents with   Jaw Swelling    Erica Whitney is a 82 y.o. female who presents with her children at the bedside with concern for altered mental status and right-sided facial and neck swelling that started today.  Patient normally lives alone, performs ADLs and is cognitively intact.  Has been confused today.  Afebrile.  Patient with history of breast cancer, CKD, anemia secondary to chronic kidney disease, peptic ulcer disease, type 2 diabetes.  Level 5 caveat due to patient's altered mental status.  She is not anticoagulated.  HPI     Home Medications Prior to Admission medications   Medication Sig Start Date End Date Taking? Authorizing Provider  ACCU-CHEK GUIDE test strip qd 03/12/17   [provider]  AIMSCO INSULIN SYR ULTRA THIN 31G X 5/16" 0.3 ML MISC  03/12/17   [provider]  amLODipine (NORVASC) 10 MG tablet Take 1 tablet (10 mg total) by mouth daily. 05/17/21   Mercy Riding, MD  atorvastatin (LIPITOR) 40 MG tablet Take 40 mg by mouth daily.    [provider]  Blood Glucose Calibration (ACCU-CHEK GUIDE CONTROL) LIQD  03/12/17   [provider]  Calcium Carbonate-Vitamin D 600-400 MG-UNIT tablet Take 1 tablet by mouth daily.    [provider]  cetirizine (ZYRTEC) 10 MG tablet Take 10 mg by mouth daily as needed for allergies. 04/14/21   [provider]  cholecalciferol (VITAMIN D3) 25 MCG (1000 UNIT) tablet Take 1,000 Units by mouth daily.    [provider]  cycloSPORINE (RESTASIS) 0.05 % ophthalmic emulsion Place 1 drop into both eyes 2 (two) times daily.    [provider]  diclofenac Sodium (VOLTAREN) 1 % GEL Apply 4 g topically 4 (four) times daily as needed (knee/leg pain). 04/23/21   [provider]  ferrous sulfate 325 (65  FE) MG tablet Take 1 tablet (325 mg total) by mouth daily with breakfast. 05/31/21   Amin, Jeanella Flattery, MD  furosemide (LASIX) 40 MG tablet Take 1 tablet (40 mg total) by mouth daily as needed for fluid (ankle swelling). 05/23/21   Mercy Riding, MD  gabapentin (NEURONTIN) 100 MG capsule Take 100 mg by mouth 2 (two) times daily. 03/23/21   [provider]  hydrALAZINE (APRESOLINE) 50 MG tablet Take 1 tablet (50 mg total) by mouth 3 (three) times daily. 05/16/21   Mercy Riding, MD  mirtazapine (REMERON) 30 MG tablet Take 30 mg by mouth at bedtime. 04/07/21   [provider]  pantoprazole (PROTONIX) 40 MG tablet Take 1 tablet (40 mg total) by mouth 2 (two) times daily before a meal for 30 days, THEN 1 tablet (40 mg total) daily. 05/16/21 08/14/21  Mercy Riding, MD  Prenatal Vit-Fe Fumarate-FA (PRENATAL PO) Take 1 tablet by mouth daily.    [provider]  senna-docusate (SENOKOT-S) 8.6-50 MG tablet Take 1 tablet by mouth at bedtime as needed for mild constipation or moderate constipation. 05/30/21   Amin, Jeanella Flattery, MD  sertraline (ZOLOFT) 100 MG tablet Take 100 mg by mouth every morning.    [provider]  triamcinolone cream (KENALOG) 0.1 % Apply 1 application topically 2 (two) times daily as needed (rash). 12/21/20   [provider]  vitamin C (ASCORBIC ACID) 500 MG tablet Take 500 mg  by mouth daily.    [provider]      Allergies    Patient has no known allergies.    Review of Systems   Review of Systems  Unable to perform ROS: Mental status change    Physical Exam Updated Vital Signs BP (!) 159/68 (BP Location: Right Arm)   Pulse 83   Temp 98.6 F (37 C) (Oral)   Resp 14   Ht 5' 3"$  (1.6 m)   Wt 71 kg   SpO2 99%   BMI 27.73 kg/m  Physical Exam Vitals and nursing note reviewed.  Constitutional:      Appearance: She is not ill-appearing or toxic-appearing.  HENT:     Head: Atraumatic.      Nose: Nose normal.      Mouth/Throat:     Mouth: Mucous membranes are dry.     Dentition: Abnormal dentition. Dental caries present.     Pharynx: Oropharynx is clear. Uvula midline. No oropharyngeal exudate or posterior oropharyngeal erythema.  Eyes:     General:        Right eye: No discharge.        Left eye: No discharge.     Extraocular Movements: Extraocular movements intact.     Conjunctiva/sclera: Conjunctivae normal.     Pupils: Pupils are equal, round, and reactive to light.  Cardiovascular:     Rate and Rhythm: Normal rate and regular rhythm.     Pulses: Normal pulses.     Heart sounds: Normal heart sounds. No murmur heard. Pulmonary:     Effort: Pulmonary effort is normal. No respiratory distress.     Breath sounds: Normal breath sounds. No wheezing or rales.  Abdominal:     General: Bowel sounds are normal. There is no distension.     Palpations: Abdomen is soft.     Tenderness: There is no abdominal tenderness. There is no guarding or rebound.  Musculoskeletal:        General: No deformity.     Cervical back: Neck supple.     Right lower leg: No edema.     Left lower leg: No edema.  Skin:    General: Skin is warm and dry.  Neurological:     General: No focal deficit present.     Mental Status: She is lethargic.     GCS: GCS eye subscore is 3. GCS verbal subscore is 2. GCS motor subscore is 5.  Psychiatric:        Mood and Affect: Mood normal.        ED Results / Procedures / Treatments   Labs (all labs ordered are listed, but only abnormal results are displayed) Labs Reviewed  COMPREHENSIVE METABOLIC PANEL - Abnormal; Notable for the following components:      Result Value   Potassium 3.4 (*)    Glucose, Bld 131 (*)    BUN 124 (*)    Creatinine, Ser 4.91 (*)    Albumin 3.1 (*)    AST 11 (*)    GFR, Estimated 8 (*)    Anion gap 16 (*)    All other components within normal limits  CBC WITH DIFFERENTIAL/PLATELET - Abnormal; Notable for the following components:   WBC 13.0  (*)    RBC 3.19 (*)    Hemoglobin 10.3 (*)    HCT 32.3 (*)    MCV 101.3 (*)    Neutro Abs 10.2 (*)    Monocytes Absolute 1.7 (*)    All other  components within normal limits  APTT - Abnormal; Notable for the following components:   aPTT 41 (*)    All other components within normal limits  CULTURE, BLOOD (ROUTINE X 2)  CULTURE, BLOOD (ROUTINE X 2)  LACTIC ACID, PLASMA  PROTIME-INR  LACTIC ACID, PLASMA  URINALYSIS, ROUTINE W REFLEX MICROSCOPIC    EKG EKG Interpretation  Date/Time:  Friday January 26 2023 02:52:36 EST Ventricular Rate:  78 PR Interval:  176 QRS Duration: 106 QT Interval:  416 QTC Calculation: 474 R Axis:   1 Text Interpretation: Sinus rhythm Atrial premature complex Borderline T abnormalities, anterior leads Confirmed by Orpah Greek [phone]) on 01/26/2023 4:59:46 AM  Radiology CT Maxillofacial Wo Contrast  Result Date: 01/26/2023 CLINICAL DATA:  82 year old female with delirium, redness and swelling at the right upper jaw, neck soft tissue swelling and induration with pain. The patient has baseline renal insufficiency (baseline creatinine of about 3) and further elevated creatinine in now to 5. IV contrast was deferred. EXAM: CT NECK WITHOUT CONTRAST CT MAXILLOFACIAL WITHOUT CONTRAST TECHNIQUE: Multidetector CT imaging of the neck, face is most was performed following the standard protocol without intravenous contrast. RADIATION DOSE REDUCTION: This exam was performed according to the departmental dose-optimization program which includes automated exposure control, adjustment of the mA and/or kV according to patient size and/or use of iterative reconstruction technique. COMPARISON:  Head CT today reported separately. FINDINGS: Pharynx and larynx: Inflammation throughout the right parapharyngeal space, least affecting the level of the nasopharynx. Asymmetry of the pharynx and supraglottic larynx with evidence of swollen right lateral pharyngeal wall. But no  obvious tonsillar enlargement. Epiglottis remains within normal limits. Fluid and/or inflammation throughout the retropharyngeal space, most apparent from the C2 to the C4 cervical level as seen on sagittal image 46 of neck series 5. This is contiguous with both carotid spaces. The left parapharyngeal space is relatively normal. Salivary glands: Noncontrast sublingual space seems to remain within normal limits. Diffuse soft tissue swelling and inflammation throughout the right submandibular space, right submandibular gland is largely obscured. And subcutaneous soft tissue inflammation tracks across midline to the left at the level of the hyoid and submental soft tissues, and the left submandibular space and gland also seems somewhat inflamed. Diffusely enlarged and inflamed right parotid gland. Widespread regional subcutaneous inflammation, thickening of the platysma, and inflammation in the regional right neck lymph node stations. Contralateral left parotid gland appears within normal limits. No sialolithiasis identified. No dilatation of the right parotid duct is evident. Thyroid: Heterogeneous.  No significant regional mass effect. Lymph nodes: Inflammation throughout the right neck lymph node stations and small but reactive appearing right level 1, level 2, and level 3 lymph nodes. Evaluation for cystic or necrotic node limited in the of the lack of IV contrast. Contralateral left cervical lymph node stations remain within normal limits. Vascular: Vascular patency is not evaluated in the absence of IV contrast. And there is a conspicuous appearance of the noncontrast right internal jugular vein in the lower neck on coronal image 54 of neck series 6. Superimposed bulky calcified right carotid bifurcation atherosclerosis. Limited intracranial: Head CT is reported separately. Visualized orbits: See Head CT reported separately. Mastoids and visualized paranasal sinuses: Best demonstrated on the face CT, the  bilateral tympanic cavities and mastoids are clear. Paranasal sinuses are well aerated with of left sphenoid sinus 16 mm mucous retention cyst and otherwise only mild scattered paranasal sinus mucosal thickening. Nasal cavity demonstrates symmetric mucosal thickening. Skeleton: Best demonstrated on the  CT face, much of the posterior mandible and maxillary dentition is absent. The residual mostly anterior maxillary and mandible dentition is diffusely carious and poor. However, periapical lucency more affects the maxilla than the mandible, and none of these seem to be the epicenter of inflammation described above. Mandible overall intact and aligned. Superimposed advanced cervical spine degeneration. No cervical endplate erosion is evident. No acute or suspicious osseous lesion identified. Upper chest: Calcified aortic atherosclerosis. No definite superior mediastinal inflammation. Lung apices are relatively clear with subtle centrilobular emphysema. Other findings: Right periauricular soft tissue inflammation in association with the right parotid gland findings. But the right pinna does not appear significantly swollen. The superficial portion of the right external auditory canal is inflamed and thickened, but the EAC remains patent. IMPRESSION: 1. Marked inflammatory process throughout the right neck, extending from the superficial subcutaneous tissues into the deep spaces including the right pharynx, parapharyngeal and retropharyngeal spaces. Etiology indeterminate on this noncontrast exam and differential considerations fairly broad, including Severe right Sialadenitis, complicated Pharyngitis, complicated Cellulitis, Retropharyngeal Infection, acute Right Internal Jugular Vein Thrombosis. Recommend a follow-up Neck CT with IV contrast when feasible. Failing that, a noncontrast Neck MRI might allow narrowing of the differential diagnosis. 2. Pertinent negatives include normal right middle ear and mastoids, no  significant paranasal sinus inflammation, no obvious osteomyelitis, visible lung apices clear and poor dentition although seems not directly related to the right neck inflammation. 3. Salient findings were discussed by telephone with Erenest Rasher, PA on 01/26/2023 at 0440 hours Electronically Signed   By: Genevie Ann M.D.   On: 01/26/2023 04:58   CT Soft Tissue Neck Wo Contrast  Result Date: 01/26/2023 CLINICAL DATA:  82 year old female with delirium, redness and swelling at the right upper jaw, neck soft tissue swelling and induration with pain. The patient has baseline renal insufficiency (baseline creatinine of about 3) and further elevated creatinine in now to 5. IV contrast was deferred. EXAM: CT NECK WITHOUT CONTRAST CT MAXILLOFACIAL WITHOUT CONTRAST TECHNIQUE: Multidetector CT imaging of the neck, face is most was performed following the standard protocol without intravenous contrast. RADIATION DOSE REDUCTION: This exam was performed according to the departmental dose-optimization program which includes automated exposure control, adjustment of the mA and/or kV according to patient size and/or use of iterative reconstruction technique. COMPARISON:  Head CT today reported separately. FINDINGS: Pharynx and larynx: Inflammation throughout the right parapharyngeal space, least affecting the level of the nasopharynx. Asymmetry of the pharynx and supraglottic larynx with evidence of swollen right lateral pharyngeal wall. But no obvious tonsillar enlargement. Epiglottis remains within normal limits. Fluid and/or inflammation throughout the retropharyngeal space, most apparent from the C2 to the C4 cervical level as seen on sagittal image 46 of neck series 5. This is contiguous with both carotid spaces. The left parapharyngeal space is relatively normal. Salivary glands: Noncontrast sublingual space seems to remain within normal limits. Diffuse soft tissue swelling and inflammation throughout the right  submandibular space, right submandibular gland is largely obscured. And subcutaneous soft tissue inflammation tracks across midline to the left at the level of the hyoid and submental soft tissues, and the left submandibular space and gland also seems somewhat inflamed. Diffusely enlarged and inflamed right parotid gland. Widespread regional subcutaneous inflammation, thickening of the platysma, and inflammation in the regional right neck lymph node stations. Contralateral left parotid gland appears within normal limits. No sialolithiasis identified. No dilatation of the right parotid duct is evident. Thyroid: Heterogeneous.  No significant regional mass effect.  Lymph nodes: Inflammation throughout the right neck lymph node stations and small but reactive appearing right level 1, level 2, and level 3 lymph nodes. Evaluation for cystic or necrotic node limited in the of the lack of IV contrast. Contralateral left cervical lymph node stations remain within normal limits. Vascular: Vascular patency is not evaluated in the absence of IV contrast. And there is a conspicuous appearance of the noncontrast right internal jugular vein in the lower neck on coronal image 54 of neck series 6. Superimposed bulky calcified right carotid bifurcation atherosclerosis. Limited intracranial: Head CT is reported separately. Visualized orbits: See Head CT reported separately. Mastoids and visualized paranasal sinuses: Best demonstrated on the face CT, the bilateral tympanic cavities and mastoids are clear. Paranasal sinuses are well aerated with of left sphenoid sinus 16 mm mucous retention cyst and otherwise only mild scattered paranasal sinus mucosal thickening. Nasal cavity demonstrates symmetric mucosal thickening. Skeleton: Best demonstrated on the CT face, much of the posterior mandible and maxillary dentition is absent. The residual mostly anterior maxillary and mandible dentition is diffusely carious and poor. However,  periapical lucency more affects the maxilla than the mandible, and none of these seem to be the epicenter of inflammation described above. Mandible overall intact and aligned. Superimposed advanced cervical spine degeneration. No cervical endplate erosion is evident. No acute or suspicious osseous lesion identified. Upper chest: Calcified aortic atherosclerosis. No definite superior mediastinal inflammation. Lung apices are relatively clear with subtle centrilobular emphysema. Other findings: Right periauricular soft tissue inflammation in association with the right parotid gland findings. But the right pinna does not appear significantly swollen. The superficial portion of the right external auditory canal is inflamed and thickened, but the EAC remains patent. IMPRESSION: 1. Marked inflammatory process throughout the right neck, extending from the superficial subcutaneous tissues into the deep spaces including the right pharynx, parapharyngeal and retropharyngeal spaces. Etiology indeterminate on this noncontrast exam and differential considerations fairly broad, including Severe right Sialadenitis, complicated Pharyngitis, complicated Cellulitis, Retropharyngeal Infection, acute Right Internal Jugular Vein Thrombosis. Recommend a follow-up Neck CT with IV contrast when feasible. Failing that, a noncontrast Neck MRI might allow narrowing of the differential diagnosis. 2. Pertinent negatives include normal right middle ear and mastoids, no significant paranasal sinus inflammation, no obvious osteomyelitis, visible lung apices clear and poor dentition although seems not directly related to the right neck inflammation. 3. Salient findings were discussed by telephone with Erenest Rasher, PA on 01/26/2023 at 0440 hours Electronically Signed   By: Genevie Ann M.D.   On: 01/26/2023 04:58   CT Head Wo Contrast  Result Date: 01/26/2023 CLINICAL DATA:  82 year old female with delirium, redness and swelling at the right  upper jaw, neck soft tissue swelling and induration with pain. EXAM: CT HEAD WITHOUT CONTRAST TECHNIQUE: Contiguous axial images were obtained from the base of the skull through the vertex without intravenous contrast. RADIATION DOSE REDUCTION: This exam was performed according to the departmental dose-optimization program which includes automated exposure control, adjustment of the mA and/or kV according to patient size and/or use of iterative reconstruction technique. COMPARISON:  CT face and neck today reported separately. Head CT 09/02/2007. FINDINGS: Brain: Cerebral volume loss since 2008 appears fairly generalized. No midline shift, ventriculomegaly, mass effect, evidence of mass lesion, intracranial hemorrhage or evidence of cortically based acute infarction. Mild to moderate patchy bilateral cerebral white matter hypodensity is new or progressed since 2008. And there is heterogeneity in the right thalamus compatible with a small age indeterminate lacunar  infarct there. No cortical encephalomalacia identified. Vascular: Calcified atherosclerosis at the skull base. No suspicious intracranial vascular hyperdensity. Skull: Osteopenia. Motion artifact at the skull base. No acute osseous abnormality identified. Sinuses/Orbits: Paranasal sinuses are well aerated. There is a left sphenoid mucous retention cysts. Tympanic cavities and mastoids appear clear. Other: Calcified scalp vessel atherosclerosis. Partially visible widespread subcutaneous inflammation in the region of the right ear, tracking inferiorly. See comparison neck and face CTs. Orbits soft tissues appear negative. IMPRESSION: 1. Progressed small vessel disease since 2008, including in the right thalamus which is age indeterminate. 2. No other acute intracranial abnormality identified. 3. Partially visible widespread superficial inflammation surrounding the right ear. But right middle ear and mastoids remain clear. See comparison Neck and Face CT  reported separately. Electronically Signed   By: Genevie Ann M.D.   On: 01/26/2023 04:35   DG Chest Port 1 View  Result Date: 01/26/2023 CLINICAL DATA:  Questionable sepsis EXAM: PORTABLE CHEST 1 VIEW COMPARISON:  12/11/2008 FINDINGS: Heart and mediastinal contours are within normal limits. No focal opacities or effusions. No acute bony abnormality. Aortic atherosclerosis. IMPRESSION: No active disease. Electronically Signed   By: Rolm Baptise M.D.   On: 01/26/2023 02:51    Procedures Ultrasound ED Soft Tissue  Date/Time: 01/26/2023 5:47 AM  Performed by: Emeline Darling, PA-C Authorized by: Emeline Darling, PA-C   Procedure details:    Indications comment:  To assess for IJ compressibility in context of abnormal CT of the neck.   Transverse view:  Visualized   Longitudinal view:  Visualized   Images: archived   Location:    Location: neck     Side:  Right Comments:     Right IJ compressible from the level of the clavicle up to the ear.     Medications Ordered in ED Medications  vancomycin (VANCOCIN) IVPB 1000 mg/200 mL premix (1,000 mg Intravenous New Bag/Given 01/26/23 0527)  lactated ringers bolus 500 mL (0 mLs Intravenous Stopped 01/26/23 0459)  fentaNYL (SUBLIMAZE) injection 50 mcg (50 mcg Intravenous Given 01/26/23 0437)  ondansetron (ZOFRAN) injection 4 mg (4 mg Intravenous Given 01/26/23 0437)  lactated ringers bolus 500 mL (500 mLs Intravenous New Bag/Given 01/26/23 0530)    ED Course/ Medical Decision Making/ A&P Clinical Course as of 01/26/23 0625  Fri Jan 26, 2023  0609 No call-back from hospitalist > 1 hour, repaged. [RS]  [phone] Consult call from Dr. Hal Hope, hospitalist was agreeable to admit this patient to his service. I appreciate his collaboration in the care of this patient.  [RS]    Clinical Course User Index [RS] Armstrong Creasy, Gypsy Balsam, PA-C                             Medical Decision Making 82 year old female who presents with concern for right-sided  facial and neck swelling.  Altered mental status.  Hypertensive on intake, vital signs otherwise normal.  Cardiopulmonary dam is normal, abdominal exam is benign.  Right-sided facial swelling as above.  Throughout patient's ED stay erythema and swelling has progressed.  Patient confused, oriented only to herself at this time.  The differential diagnosis for AMS is extensive and includes, but is not limited to:  Drug overdose - opioids, alcohol, sedatives, antipsychotics, drug withdrawal, others Metabolic: hypoxia, hypoglycemia, hyperglycemia, hypercalcemia, hypernatremia, hyponatremia, uremia, hepatic encephalopathy, hypothyroidism, hyperthyroidism, vitamin B12 or thiamine deficiency, carbon monoxide poisoning, Wilson's disease, Lactic acidosis, DKA/HHOS Infectious: meningitis, encephalitis, bacteremia/sepsis, urinary tract infection,  pneumonia, neurosyphilis Structural: Space-occupying lesion, (brain tumor, subdural hematoma, hydrocephalus,) Vascular: stroke, subarachnoid hemorrhage, coronary ischemia, hypertensive encephalopathy, CNS vasculitis, thrombotic thrombocytopenic purpura, disseminated intravascular coagulation, hyperviscosity Psychiatric: Schizophrenia, depression; Other: Seizure, hypothermia, heat stroke, ICU psychosis, dementia -"sundowning."     Amount and/or Complexity of Data Reviewed Labs: ordered.    Details: CBC with leukocytosis of 13,000, hemoglobin of 10.3 near patient's baseline.  CMP with mild hypokalemia of 3.4, significant AKI with creatinine of 4.9 increased from patient's baseline closer to 3.  Lactic acid initially is normal INR is normal.   Radiology: ordered.    Details: Chest x-ray negative for acute cardiopulmonary disease, visualized with provider.  Head CT negative for acute cranial abnormality.  Unfortunately had to proceed with noncontrasted CTs of the face and soft tissues of the neck secondary to AKI; critical result called from Dr. Nevada Crane radiologist for  further discussion with concern for marked inflammatory process throughout the right neck concerning for deep space infection versus right IJ venous thrombosis.  He recommended contrasted study, however given discussion of poor kidney function at this time and patient who is not currently on dialysis will after forego this at this time.  Recommend vascular duplex of the IJ during the daytime.  In the interim,  Bedside ultrasound of the right IJ performed by this provider with the assistance of EDP Dr. Waverly Ferrari.  Right IJ compressible from the level of the clavicle all the way up to the right ear. ECG/medicine tests: ordered.  Risk Prescription drug management. Decision regarding hospitalization.     Will treat for infectious etiology with vancomycin at this time.  Will require admission to the hospital for management of facial/neck cellulitis as well as AKI.Airway is patent, patient is not septic, and is A&O x 3 at this time.   Consult to hospitalist as above.  I appreciate his collaboration care of this patient.  [place] voiced understanding of her medical evaluation and treatment plan. Each of their questions answered to their expressed satisfaction.  She is amenable to plan for admission at this time.   This chart was dictated using voice recognition software, Dragon. Despite the best efforts of this provider to proofread and correct errors, errors may still occur which can change documentation meaning.    Final Clinical Impression(s) / ED Diagnoses Final diagnoses:  Cellulitis of neck  AKI (acute kidney injury) [facility])    Rx / DC Orders ED Discharge Orders     None         Emeline Darling, PA-C 01/26/23 [id number]    Orpah Greek, MD 01/26/23 [phone]

## 2023-01-26 NOTE — ED Notes (Signed)
US at bedside

## 2023-01-26 NOTE — ED Notes (Signed)
PT at bedside.

## 2023-01-26 NOTE — ED Notes (Addendum)
Called lab to see if they could add creatine, sodium, and protein to existing urine specimen. Lab stated to send order request ion to lab and they will add it to existing specimen. Order req tubed down to lab.

## 2023-01-26 NOTE — ED Triage Notes (Signed)
Patient's daughter noticed swelling and redness at patient's right upper jaw this evening .

## 2023-01-26 NOTE — ED Notes (Signed)
..ED TO INPATIENT HANDOFF REPORT  ED Nurse Name and Phone #:  Mo 17   S Name/Age/Gender Erica Whitney 82 y.o. female Room/Bed: 042C/042C  Code Status   Code Status: Full Code  Home/SNF/Other Home Patient oriented to: self, place, time, and situation Is this baseline? Yes   Triage Complete: Triage complete  Chief Complaint Cellulitis [L03.90]  Triage Note Patient's daughter noticed swelling and redness at patient's right upper jaw this evening .    Allergies No Known Allergies  Level of Care/Admitting Diagnosis ED Disposition     ED Disposition  Admit   Condition  --   Comment  Hospital Area: [facility] [[id number]]  Level of Care: Telemetry Medical [104]  May admit patient to [facility] or [facility] if equivalent level of care is available:: Yes  Covid Evaluation: Asymptomatic - no recent exposure (last 10 days) testing not required  Diagnosis: Cellulitis [id number]  Admitting Physician: Guilford Shi [id number]  Attending Physician: Guilford Shi [id number]  Certification:: I certify this patient will need inpatient services for at least 2 midnights  Estimated Length of Stay: 3          B Medical/Surgery History Past Medical History:  Diagnosis Date   Anemia in chronic kidney disease (CKD)    Arthritis    Breast cancer ([place])    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   Chronic kidney disease (CKD), stage IV (severe) ([place])    Depression    Diabetes mellitus    Hypercholesteremia    Hypertension    Insomnia    Peptic ulcer    Uterine cancer ([place])    dx in her 45s   Wears glasses    Past Surgical History:  Procedure Laterality Date   ABDOMINAL HYSTERECTOMY  1995   BACK SURGERY  2000   lumb lam   BIOPSY  05/14/2021   Procedure: BIOPSY;  Surgeon: Doran Stabler, MD;  Location: [facility] ENDOSCOPY;  Service: Gastroenterology;;   BREAST LUMPECTOMY Right 2015   COLONOSCOPY      ESOPHAGOGASTRODUODENOSCOPY N/A 05/14/2021   Procedure: ESOPHAGOGASTRODUODENOSCOPY (EGD);  Surgeon: Doran Stabler, MD;  Location: [facility];  Service: Gastroenterology;  Laterality: N/A;   ESOPHAGOGASTRODUODENOSCOPY (EGD) WITH PROPOFOL N/A 05/26/2021   Procedure: ESOPHAGOGASTRODUODENOSCOPY (EGD) WITH PROPOFOL;  Surgeon: Carol Ada, MD;  Location: [facility];  Service: Endoscopy;  Laterality: N/A;   HEMOSTASIS CLIP PLACEMENT  05/26/2021   Procedure: HEMOSTASIS CLIP PLACEMENT;  Surgeon: Carol Ada, MD;  Location: [facility];  Service: Endoscopy;;   HEMOSTASIS CONTROL  05/14/2021   Procedure: HEMOSTASIS CONTROL;  Surgeon: Doran Stabler, MD;  Location: [facility];  Service: Gastroenterology;;   HOT HEMOSTASIS N/A 05/14/2021   Procedure: HOT HEMOSTASIS (ARGON PLASMA COAGULATION/BICAP);  Surgeon: Doran Stabler, MD;  Location: [facility];  Service: Gastroenterology;  Laterality: N/A;   HOT HEMOSTASIS N/A 05/26/2021   Procedure: HOT HEMOSTASIS (ARGON PLASMA COAGULATION/BICAP);  Surgeon: Carol Ada, MD;  Location: [facility];  Service: Endoscopy;  Laterality: N/A;   ORIF METACARPAL FRACTURE  2012   left   RADIOACTIVE SEED GUIDED PARTIAL MASTECTOMY WITH AXILLARY SENTINEL LYMPH NODE BIOPSY Right 12/17/2014   Procedure: RIGHT BREAST RADIOACTIVE SEED LOCALIZED LUMPECTOMY AND SENTINEL NODE MAPPING;  Surgeon: Autumn Messing III, MD;  Location: [facility];  Service: General;  Laterality: Right;   SCLEROTHERAPY  05/26/2021   Procedure: Clide Deutscher;  Surgeon: Carol Ada, MD;  Location: [facility];  Service:  Endoscopy;;     A IV Location/Drains/Wounds Patient Lines/Drains/Airways Status     Active Line/Drains/Airways     Name Placement date Placement time Site Days   Peripheral IV 01/26/23 20 G Left Antecubital 01/26/23  0300  Antecubital  less than 1   Peripheral IV 01/26/23 20 G Anterior;Distal;Left Wrist 01/26/23  0300  Wrist  less than 1             Intake/Output Last 24 hours  Intake/Output Summary (Last 24 hours) at 01/26/2023 1232 Last data filed at 01/26/2023 0820 Gross per 24 hour  Intake 250 ml  Output 500 ml  Net -250 ml    Labs/Imaging Results for orders placed or performed during the hospital encounter of 01/26/23 (from the past 48 hour(s))  Lactic acid, plasma     Status: None   Collection Time: 01/26/23  2:50 AM  Result Value Ref Range   Lactic Acid, Venous 0.8 0.5 - 1.9 mmol/L    Comment: Performed at [facility], [place] [address] [place], [place] [address]  Comprehensive metabolic panel     Status: Abnormal   Collection Time: 01/26/23  2:50 AM  Result Value Ref Range   Sodium 139 135 - 145 mmol/L   Potassium 3.4 (L) 3.5 - 5.1 mmol/L   Chloride 101 98 - 111 mmol/L   CO2 22 22 - 32 mmol/L   Glucose, Bld 131 (H) 70 - 99 mg/dL    Comment: Glucose reference range applies only to samples taken after fasting for at least 8 hours.   BUN 124 (H) 8 - 23 mg/dL   Creatinine, Ser 4.91 (H) 0.44 - 1.00 mg/dL   Calcium 9.4 8.9 - 10.3 mg/dL   Total Protein 6.7 6.5 - 8.1 g/dL   Albumin 3.1 (L) 3.5 - 5.0 g/dL   AST 11 (L) 15 - 41 U/L   ALT 8 0 - 44 U/L   Alkaline Phosphatase 45 38 - 126 U/L   Total Bilirubin 0.8 0.3 - 1.2 mg/dL   GFR, Estimated 8 (L) >60 mL/min    Comment: (NOTE) Calculated using the CKD-EPI Creatinine Equation (2021)    Anion gap 16 (H) 5 - 15    Comment: Performed at [facility], [place] [address], [place], [place] [address]  CBC with Differential     Status: Abnormal   Collection Time: 01/26/23  2:50 AM  Result Value Ref Range   WBC 13.0 (H) 4.0 - 10.5 K/uL   RBC 3.19 (L) 3.87 - 5.11 MIL/uL   Hemoglobin 10.3 (L) 12.0 - 15.0 g/dL   HCT 32.3 (L) 36.0 - 46.0 %   MCV 101.3 (H) 80.0 - 100.0 fL   MCH 32.3 26.0 - 34.0 pg   MCHC 31.9 30.0 - 36.0 g/dL   RDW 14.7 11.5 - 15.5 %   Platelets 171 150 - 400 K/uL   nRBC 0.0 0.0 - 0.2 %   Neutrophils Relative % 78 %   Neutro Abs 10.2 (H) 1.7 - 7.7  K/uL   Lymphocytes Relative 8 %   Lymphs Abs 1.1 0.7 - 4.0 K/uL   Monocytes Relative 13 %   Monocytes Absolute 1.7 (H) 0.1 - 1.0 K/uL   Eosinophils Relative 0 %   Eosinophils Absolute 0.0 0.0 - 0.5 K/uL   Basophils Relative 0 %   Basophils Absolute 0.0 0.0 - 0.1 K/uL   Immature Granulocytes 1 %   Abs Immature Granulocytes 0.06 0.00 - 0.07 K/uL  Comment: Performed at [facility], [place] [address] [place], [place] [address]  Protime-INR     Status: None   Collection Time: 01/26/23  2:50 AM  Result Value Ref Range   Prothrombin Time 15.1 11.4 - 15.2 seconds   INR 1.2 0.8 - 1.2    Comment: (NOTE) INR goal varies based on device and disease states. Performed at [facility], [place] [address], [place], [place] [address]   APTT     Status: Abnormal   Collection Time: 01/26/23  2:50 AM  Result Value Ref Range   aPTT 41 (H) 24 - 36 seconds    Comment:        IF BASELINE aPTT IS ELEVATED, SUGGEST PATIENT RISK ASSESSMENT BE USED TO DETERMINE APPROPRIATE ANTICOAGULANT THERAPY. Performed at [facility], [place] [address], [place], [place] [address]   Blood Culture (routine x 2)     Status: None (Preliminary result)   Collection Time: 01/26/23  3:00 AM   Specimen: BLOOD  Result Value Ref Range   Specimen Description BLOOD LEFT ANTECUBITAL    Special Requests      BOTTLES DRAWN AEROBIC AND ANAEROBIC Blood Culture adequate volume   Culture      NO GROWTH < 12 HOURS Performed at [facility], [place] [address], [place], [place] [address]    Report Status PENDING   Blood Culture (routine x 2)     Status: None (Preliminary result)   Collection Time: 01/26/23  3:00 AM   Specimen: BLOOD LEFT WRIST  Result Value Ref Range   Specimen Description BLOOD LEFT WRIST    Special Requests      BOTTLES DRAWN AEROBIC AND ANAEROBIC Blood Culture results may not be optimal due to an inadequate volume of blood received in culture bottles   Culture      NO GROWTH < 12  HOURS Performed at [facility], [place] [address] [place], [place] [address]    Report Status PENDING   Lactic acid, plasma     Status: None   Collection Time: 01/26/23  5:56 AM  Result Value Ref Range   Lactic Acid, Venous 0.7 0.5 - 1.9 mmol/L    Comment: Performed at [facility] [address], [place], [place] [address]  Urinalysis, Routine w reflex microscopic -Urine, Clean Catch     Status: Abnormal   Collection Time: 01/26/23  8:15 AM  Result Value Ref Range   Color, Urine YELLOW YELLOW   APPearance CLEAR CLEAR   Specific Gravity, Urine 1.011 1.005 - 1.030   pH 5.0 5.0 - 8.0   Glucose, UA NEGATIVE NEGATIVE mg/dL   Hgb urine dipstick NEGATIVE NEGATIVE   Bilirubin Urine NEGATIVE NEGATIVE   Ketones, ur NEGATIVE NEGATIVE mg/dL   Protein, ur 100 (A) NEGATIVE mg/dL   Nitrite NEGATIVE NEGATIVE   Leukocytes,Ua NEGATIVE NEGATIVE   RBC / HPF 0-5 0 - 5 RBC/hpf   WBC, UA 0-5 0 - 5 WBC/hpf   Bacteria, UA NONE SEEN NONE SEEN   Squamous Epithelial / HPF 0-5 0 - 5 /HPF    Comment: Performed at [facility], [place] [address], [place] [address]  CBC     Status: Abnormal   Collection Time: 01/26/23  8:53 AM  Result Value Ref Range   WBC 14.1 (H) 4.0 - 10.5 K/uL   RBC 3.20 (L) 3.87 - 5.11 MIL/uL   Hemoglobin 10.2 (L) 12.0 - 15.0 g/dL  HCT 32.7 (L) 36.0 - 46.0 %   MCV 102.2 (H) 80.0 - 100.0 fL   MCH 31.9 26.0 - 34.0 pg   MCHC 31.2 30.0 - 36.0 g/dL   RDW 14.6 11.5 - 15.5 %   Platelets 175 150 - 400 K/uL   nRBC 0.0 0.0 - 0.2 %    Comment: Performed at [facility] [address], [place], [place] [address]  Creatinine, serum     Status: Abnormal   Collection Time: 01/26/23  8:53 AM  Result Value Ref Range   Creatinine, Ser 4.60 (H) 0.44 - 1.00 mg/dL   GFR, Estimated 9 (L) >60 mL/min    Comment: (NOTE) Calculated using the CKD-EPI Creatinine Equation (2021) Performed at [facility] [address], [place], [place] [address]    US  RENAL  Result Date: 01/26/2023 CLINICAL DATA:  Acute kidney injury. EXAM: RENAL / URINARY TRACT ULTRASOUND COMPLETE COMPARISON:  Renal ultrasound 04/19/2021 FINDINGS: Right Kidney: Renal measurements: 7.4 x 3.4 x 4.6 cm = volume: 61 mL. Echogenicity within normal limits. No mass or hydronephrosis visualized. Left Kidney: Renal measurements: 9.6 x 4.0 x 6.3 cm = volume: 127 mL. Increased parenchymal echogenicity. No solid mass or hydronephrosis. Unchanged 3.2 cm simple appearing cyst for which no follow-up imaging is recommended. Bladder: Appears normal for degree of bladder distention. Other: None. IMPRESSION: 1. No hydronephrosis. 2. Chronic, asymmetrically severe right renal atrophy. Electronically Signed   By: Logan Bores M.D.   On: 01/26/2023 10:34   CT Maxillofacial Wo Contrast  Result Date: 01/26/2023 CLINICAL DATA:  82 year old female with delirium, redness and swelling at the right upper jaw, neck soft tissue swelling and induration with pain. The patient has baseline renal insufficiency (baseline creatinine of about 3) and further elevated creatinine in now to 5. IV contrast was deferred. EXAM: CT NECK WITHOUT CONTRAST CT MAXILLOFACIAL WITHOUT CONTRAST TECHNIQUE: Multidetector CT imaging of the neck, face is most was performed following the standard protocol without intravenous contrast. RADIATION DOSE REDUCTION: This exam was performed according to the departmental dose-optimization program which includes automated exposure control, adjustment of the mA and/or kV according to patient size and/or use of iterative reconstruction technique. COMPARISON:  Head CT today reported separately. FINDINGS: Pharynx and larynx: Inflammation throughout the right parapharyngeal space, least affecting the level of the nasopharynx. Asymmetry of the pharynx and supraglottic larynx with evidence of swollen right lateral pharyngeal wall. But no obvious tonsillar enlargement. Epiglottis remains within normal limits. Fluid  and/or inflammation throughout the retropharyngeal space, most apparent from the C2 to the C4 cervical level as seen on sagittal image 46 of neck series 5. This is contiguous with both carotid spaces. The left parapharyngeal space is relatively normal. Salivary glands: Noncontrast sublingual space seems to remain within normal limits. Diffuse soft tissue swelling and inflammation throughout the right submandibular space, right submandibular gland is largely obscured. And subcutaneous soft tissue inflammation tracks across midline to the left at the level of the hyoid and submental soft tissues, and the left submandibular space and gland also seems somewhat inflamed. Diffusely enlarged and inflamed right parotid gland. Widespread regional subcutaneous inflammation, thickening of the platysma, and inflammation in the regional right neck lymph node stations. Contralateral left parotid gland appears within normal limits. No sialolithiasis identified. No dilatation of the right parotid duct is evident. Thyroid: Heterogeneous.  No significant regional mass effect. Lymph nodes: Inflammation throughout the right neck lymph node stations and small but reactive appearing right level 1,  level 2, and level 3 lymph nodes. Evaluation for cystic or necrotic node limited in the of the lack of IV contrast. Contralateral left cervical lymph node stations remain within normal limits. Vascular: Vascular patency is not evaluated in the absence of IV contrast. And there is a conspicuous appearance of the noncontrast right internal jugular vein in the lower neck on coronal image 54 of neck series 6. Superimposed bulky calcified right carotid bifurcation atherosclerosis. Limited intracranial: Head CT is reported separately. Visualized orbits: See Head CT reported separately. Mastoids and visualized paranasal sinuses: Best demonstrated on the face CT, the bilateral tympanic cavities and mastoids are clear. Paranasal sinuses are well aerated  with of left sphenoid sinus 16 mm mucous retention cyst and otherwise only mild scattered paranasal sinus mucosal thickening. Nasal cavity demonstrates symmetric mucosal thickening. Skeleton: Best demonstrated on the CT face, much of the posterior mandible and maxillary dentition is absent. The residual mostly anterior maxillary and mandible dentition is diffusely carious and poor. However, periapical lucency more affects the maxilla than the mandible, and none of these seem to be the epicenter of inflammation described above. Mandible overall intact and aligned. Superimposed advanced cervical spine degeneration. No cervical endplate erosion is evident. No acute or suspicious osseous lesion identified. Upper chest: Calcified aortic atherosclerosis. No definite superior mediastinal inflammation. Lung apices are relatively clear with subtle centrilobular emphysema. Other findings: Right periauricular soft tissue inflammation in association with the right parotid gland findings. But the right pinna does not appear significantly swollen. The superficial portion of the right external auditory canal is inflamed and thickened, but the EAC remains patent. IMPRESSION: 1. Marked inflammatory process throughout the right neck, extending from the superficial subcutaneous tissues into the deep spaces including the right pharynx, parapharyngeal and retropharyngeal spaces. Etiology indeterminate on this noncontrast exam and differential considerations fairly broad, including Severe right Sialadenitis, complicated Pharyngitis, complicated Cellulitis, Retropharyngeal Infection, acute Right Internal Jugular Vein Thrombosis. Recommend a follow-up Neck CT with IV contrast when feasible. Failing that, a noncontrast Neck MRI might allow narrowing of the differential diagnosis. 2. Pertinent negatives include normal right middle ear and mastoids, no significant paranasal sinus inflammation, no obvious osteomyelitis, visible lung apices  clear and poor dentition although seems not directly related to the right neck inflammation. 3. Salient findings were discussed by telephone with Erenest Rasher, PA on 01/26/2023 at 0440 hours Electronically Signed   By: Genevie Ann M.D.   On: 01/26/2023 04:58   CT Soft Tissue Neck Wo Contrast  Result Date: 01/26/2023 CLINICAL DATA:  82 year old female with delirium, redness and swelling at the right upper jaw, neck soft tissue swelling and induration with pain. The patient has baseline renal insufficiency (baseline creatinine of about 3) and further elevated creatinine in now to 5. IV contrast was deferred. EXAM: CT NECK WITHOUT CONTRAST CT MAXILLOFACIAL WITHOUT CONTRAST TECHNIQUE: Multidetector CT imaging of the neck, face is most was performed following the standard protocol without intravenous contrast. RADIATION DOSE REDUCTION: This exam was performed according to the departmental dose-optimization program which includes automated exposure control, adjustment of the mA and/or kV according to patient size and/or use of iterative reconstruction technique. COMPARISON:  Head CT today reported separately. FINDINGS: Pharynx and larynx: Inflammation throughout the right parapharyngeal space, least affecting the level of the nasopharynx. Asymmetry of the pharynx and supraglottic larynx with evidence of swollen right lateral pharyngeal wall. But no obvious tonsillar enlargement. Epiglottis remains within normal limits. Fluid and/or inflammation throughout the retropharyngeal space, most apparent from  the C2 to the C4 cervical level as seen on sagittal image 46 of neck series 5. This is contiguous with both carotid spaces. The left parapharyngeal space is relatively normal. Salivary glands: Noncontrast sublingual space seems to remain within normal limits. Diffuse soft tissue swelling and inflammation throughout the right submandibular space, right submandibular gland is largely obscured. And subcutaneous soft  tissue inflammation tracks across midline to the left at the level of the hyoid and submental soft tissues, and the left submandibular space and gland also seems somewhat inflamed. Diffusely enlarged and inflamed right parotid gland. Widespread regional subcutaneous inflammation, thickening of the platysma, and inflammation in the regional right neck lymph node stations. Contralateral left parotid gland appears within normal limits. No sialolithiasis identified. No dilatation of the right parotid duct is evident. Thyroid: Heterogeneous.  No significant regional mass effect. Lymph nodes: Inflammation throughout the right neck lymph node stations and small but reactive appearing right level 1, level 2, and level 3 lymph nodes. Evaluation for cystic or necrotic node limited in the of the lack of IV contrast. Contralateral left cervical lymph node stations remain within normal limits. Vascular: Vascular patency is not evaluated in the absence of IV contrast. And there is a conspicuous appearance of the noncontrast right internal jugular vein in the lower neck on coronal image 54 of neck series 6. Superimposed bulky calcified right carotid bifurcation atherosclerosis. Limited intracranial: Head CT is reported separately. Visualized orbits: See Head CT reported separately. Mastoids and visualized paranasal sinuses: Best demonstrated on the face CT, the bilateral tympanic cavities and mastoids are clear. Paranasal sinuses are well aerated with of left sphenoid sinus 16 mm mucous retention cyst and otherwise only mild scattered paranasal sinus mucosal thickening. Nasal cavity demonstrates symmetric mucosal thickening. Skeleton: Best demonstrated on the CT face, much of the posterior mandible and maxillary dentition is absent. The residual mostly anterior maxillary and mandible dentition is diffusely carious and poor. However, periapical lucency more affects the maxilla than the mandible, and none of these seem to be the  epicenter of inflammation described above. Mandible overall intact and aligned. Superimposed advanced cervical spine degeneration. No cervical endplate erosion is evident. No acute or suspicious osseous lesion identified. Upper chest: Calcified aortic atherosclerosis. No definite superior mediastinal inflammation. Lung apices are relatively clear with subtle centrilobular emphysema. Other findings: Right periauricular soft tissue inflammation in association with the right parotid gland findings. But the right pinna does not appear significantly swollen. The superficial portion of the right external auditory canal is inflamed and thickened, but the EAC remains patent. IMPRESSION: 1. Marked inflammatory process throughout the right neck, extending from the superficial subcutaneous tissues into the deep spaces including the right pharynx, parapharyngeal and retropharyngeal spaces. Etiology indeterminate on this noncontrast exam and differential considerations fairly broad, including Severe right Sialadenitis, complicated Pharyngitis, complicated Cellulitis, Retropharyngeal Infection, acute Right Internal Jugular Vein Thrombosis. Recommend a follow-up Neck CT with IV contrast when feasible. Failing that, a noncontrast Neck MRI might allow narrowing of the differential diagnosis. 2. Pertinent negatives include normal right middle ear and mastoids, no significant paranasal sinus inflammation, no obvious osteomyelitis, visible lung apices clear and poor dentition although seems not directly related to the right neck inflammation. 3. Salient findings were discussed by telephone with Erenest Rasher, PA on 01/26/2023 at 0440 hours Electronically Signed   By: Genevie Ann M.D.   On: 01/26/2023 04:58   CT Head Wo Contrast  Result Date: 01/26/2023 CLINICAL DATA:  82 year old female with delirium,  redness and swelling at the right upper jaw, neck soft tissue swelling and induration with pain. EXAM: CT HEAD WITHOUT CONTRAST  TECHNIQUE: Contiguous axial images were obtained from the base of the skull through the vertex without intravenous contrast. RADIATION DOSE REDUCTION: This exam was performed according to the departmental dose-optimization program which includes automated exposure control, adjustment of the mA and/or kV according to patient size and/or use of iterative reconstruction technique. COMPARISON:  CT face and neck today reported separately. Head CT 09/02/2007. FINDINGS: Brain: Cerebral volume loss since 2008 appears fairly generalized. No midline shift, ventriculomegaly, mass effect, evidence of mass lesion, intracranial hemorrhage or evidence of cortically based acute infarction. Mild to moderate patchy bilateral cerebral white matter hypodensity is new or progressed since 2008. And there is heterogeneity in the right thalamus compatible with a small age indeterminate lacunar infarct there. No cortical encephalomalacia identified. Vascular: Calcified atherosclerosis at the skull base. No suspicious intracranial vascular hyperdensity. Skull: Osteopenia. Motion artifact at the skull base. No acute osseous abnormality identified. Sinuses/Orbits: Paranasal sinuses are well aerated. There is a left sphenoid mucous retention cysts. Tympanic cavities and mastoids appear clear. Other: Calcified scalp vessel atherosclerosis. Partially visible widespread subcutaneous inflammation in the region of the right ear, tracking inferiorly. See comparison neck and face CTs. Orbits soft tissues appear negative. IMPRESSION: 1. Progressed small vessel disease since 2008, including in the right thalamus which is age indeterminate. 2. No other acute intracranial abnormality identified. 3. Partially visible widespread superficial inflammation surrounding the right ear. But right middle ear and mastoids remain clear. See comparison Neck and Face CT reported separately. Electronically Signed   By: Genevie Ann M.D.   On: 01/26/2023 04:35   DG Chest  Port 1 View  Result Date: 01/26/2023 CLINICAL DATA:  Questionable sepsis EXAM: PORTABLE CHEST 1 VIEW COMPARISON:  12/11/2008 FINDINGS: Heart and mediastinal contours are within normal limits. No focal opacities or effusions. No acute bony abnormality. Aortic atherosclerosis. IMPRESSION: No active disease. Electronically Signed   By: Rolm Baptise M.D.   On: 01/26/2023 02:51    Pending Labs Unresulted Labs (From admission, onward)     Start     Ordered   02/02/23 0500  Creatinine, serum  (enoxaparin (LOVENOX)    CrCl < 30 ml/min)  Once,   R       Comments: while on enoxaparin therapy.    01/26/23 0750   01/27/23 [id number]  Basic metabolic panel  Tomorrow morning,   R        01/26/23 0750   01/27/23 0500  CBC  Tomorrow morning,   R        01/26/23 0750   01/26/23 0908  Sodium, urine, random  Once,   R        01/26/23 0907   01/26/23 0908  Creatinine, urine, random  Once,   R        01/26/23 0907   01/26/23 0908  Protein / creatinine ratio, urine  Once,   R        01/26/23 0907   01/26/23 0815  Hemoglobin A1c  Once,   R        01/26/23 0814            Vitals/Pain Today's Vitals   01/26/23 1115 01/26/23 1130 01/26/23 1145 01/26/23 1200  BP: 136/65 (!) 133/59 (!) 121/59 (!) 123/56  Pulse: 79 80 81 81  Resp: 16 15 14 19  $ Temp:  98.5 F (36.9 C)    TempSrc:  Oral    SpO2: 96% 97% 96% 96%  Weight:      Height:      PainSc:        Isolation Precautions No active isolations  Medications Medications  amLODipine (NORVASC) tablet 10 mg (10 mg Oral Given 01/26/23 0956)  atorvastatin (LIPITOR) tablet 40 mg (40 mg Oral Given 01/26/23 0956)  hydrALAZINE (APRESOLINE) tablet 50 mg (50 mg Oral Given 01/26/23 0957)  mirtazapine (REMERON) tablet 30 mg (has no administration in time range)  sertraline (ZOLOFT) tablet 100 mg (100 mg Oral Given 01/26/23 0956)  senna-docusate (Senokot-S) tablet 1 tablet (has no administration in time range)  ferrous sulfate tablet 325 mg (325 mg Oral Given 01/26/23  0844)  calcium-vitamin D (OSCAL WITH D) 500-5 MG-MCG per tablet 1 tablet (has no administration in time range)  cholecalciferol (VITAMIN D3) 25 MCG (1000 UNIT) tablet 1,000 Units (1,000 Units Oral Given 01/26/23 0955)  ascorbic acid (VITAMIN C) tablet 500 mg (500 mg Oral Given 01/26/23 0957)  diclofenac Sodium (VOLTAREN) 1 % topical gel 4 g (has no administration in time range)  enoxaparin (LOVENOX) injection 30 mg (30 mg Subcutaneous Given 01/26/23 0957)  lactated ringers infusion ( Intravenous New Bag/Given 01/26/23 0848)  albuterol (PROVENTIL) (2.5 MG/3ML) 0.083% nebulizer solution 2.5 mg (has no administration in time range)  hydrALAZINE (APRESOLINE) injection 10 mg (has no administration in time range)  clindamycin (CLEOCIN) IVPB 600 mg (0 mg Intravenous Stopped 01/26/23 0949)  pantoprazole (PROTONIX) EC tablet 40 mg (40 mg Oral Given 01/26/23 0956)  morphine (PF) 2 MG/ML injection 1 mg (has no administration in time range)  acetaminophen (TYLENOL) tablet 650 mg (has no administration in time range)  lactated ringers bolus 500 mL (0 mLs Intravenous Stopped 01/26/23 0459)  fentaNYL (SUBLIMAZE) injection 50 mcg (50 mcg Intravenous Given 01/26/23 0437)  ondansetron (ZOFRAN) injection 4 mg (4 mg Intravenous Given 01/26/23 0437)  vancomycin (VANCOCIN) IVPB 1000 mg/200 mL premix (0 mg Intravenous Stopped 01/26/23 0646)  lactated ringers bolus 500 mL (0 mLs Intravenous Stopped 01/26/23 0704)    Mobility walks     Focused Assessments Cardiac Assessment Handoff:    No results found for: "CKTOTAL", "CKMB", "CKMBINDEX", "TROPONINI" No results found for: "DDIMER" Does the Patient currently have chest pain? No   , Neuro Assessment Handoff:  Swallow screen pass? Yes          Neuro Assessment: Within Defined Limits Neuro Checks:      Has TPA been given? No If patient is a Neuro Trauma and patient is going to OR before floor call report to [place] nurse: [phone] or [phone]  ,  na   R Recommendations: See Admitting Provider Note  Report given to:   Additional Notes: na

## 2023-01-26 NOTE — Evaluation (Signed)
Clinical/Bedside Swallow Evaluation Patient Details  Name: Erica Whitney MRN: [id number] Date of Birth: 12-03-1941  Today's Date: 01/26/2023 Time: SLP Start Time (ACUTE ONLY): 1058 SLP Stop Time (ACUTE ONLY): 1110 SLP Time Calculation (min) (ACUTE ONLY): 12 min  Past Medical History:  Past Medical History:  Diagnosis Date   Anemia in chronic kidney disease (CKD)    Arthritis    Breast cancer ([place])    Breast cancer of upper-outer quadrant of right female breast ([place]) 11/06/2014   Chronic kidney disease (CKD), stage IV (severe) ([facility])    Depression    Diabetes mellitus    Hypercholesteremia    Hypertension    Insomnia    Peptic ulcer    Uterine cancer ([place])    dx in her 60s   Wears glasses    Past Surgical History:  Past Surgical History:  Procedure Laterality Date   ABDOMINAL HYSTERECTOMY  1995   BACK SURGERY  2000   lumb lam   BIOPSY  05/14/2021   Procedure: BIOPSY;  Surgeon: Doran Stabler, MD;  Location: [facility] ENDOSCOPY;  Service: Gastroenterology;;   BREAST LUMPECTOMY Right 2015   COLONOSCOPY     ESOPHAGOGASTRODUODENOSCOPY N/A 05/14/2021   Procedure: ESOPHAGOGASTRODUODENOSCOPY (EGD);  Surgeon: Doran Stabler, MD;  Location: [place];  Service: Gastroenterology;  Laterality: N/A;   ESOPHAGOGASTRODUODENOSCOPY (EGD) WITH PROPOFOL N/A 05/26/2021   Procedure: ESOPHAGOGASTRODUODENOSCOPY (EGD) WITH PROPOFOL;  Surgeon: Carol Ada, MD;  Location: [place];  Service: Endoscopy;  Laterality: N/A;   HEMOSTASIS CLIP PLACEMENT  05/26/2021   Procedure: HEMOSTASIS CLIP PLACEMENT;  Surgeon: Carol Ada, MD;  Location: [place];  Service: Endoscopy;;   HEMOSTASIS CONTROL  05/14/2021   Procedure: HEMOSTASIS CONTROL;  Surgeon: Doran Stabler, MD;  Location: [place];  Service: Gastroenterology;;   HOT HEMOSTASIS N/A 05/14/2021   Procedure: HOT HEMOSTASIS (ARGON PLASMA COAGULATION/BICAP);  Surgeon: Doran Stabler, MD;  Location: [place];  Service:  Gastroenterology;  Laterality: N/A;   HOT HEMOSTASIS N/A 05/26/2021   Procedure: HOT HEMOSTASIS (ARGON PLASMA COAGULATION/BICAP);  Surgeon: Carol Ada, MD;  Location: [place];  Service: Endoscopy;  Laterality: N/A;   ORIF METACARPAL FRACTURE  2012   left   RADIOACTIVE SEED GUIDED PARTIAL MASTECTOMY WITH AXILLARY SENTINEL LYMPH NODE BIOPSY Right 12/17/2014   Procedure: RIGHT BREAST RADIOACTIVE SEED LOCALIZED LUMPECTOMY AND SENTINEL NODE MAPPING;  Surgeon: Autumn Messing III, MD;  Location: [place];  Service: General;  Laterality: Right;   SCLEROTHERAPY  05/26/2021   Procedure: Clide Deutscher;  Surgeon: Carol Ada, MD;  Location: [facility] ENDOSCOPY;  Service: Endoscopy;;   HPI:  Erica Whitney is a 82 y.o. female who was brought in by daughter after noticing right mandibular swelling and redness.  CT Neck: "Inflammation throughout the right parapharyngeal space, least affecting the level of the nasopharynx. Asymmetry of the pharynx and supraglottic larynx with evidence of swollen right lateral pharyngeal wall. But no obvious tonsillar enlargement.  Epiglottis remains within normal limits. Fluid and/or inflammation throughout the retropharyngeal space. ... The left pharyngeal space is relatively normal."  Per ENT: "Right neck and face swelling- this could have various sources but most likely this is sialadenitis of the parotid." CXR 2/9 with no active disease. Pt with history of breast cancer, CKD, anemia of chronic disease, peptic ulcer disease, borderline type 2 diabetes.    Assessment / Plan / Recommendation  Clinical Impression  Pt presents with a mild oral dysphagia and clinical indicators of pharyngeal dysphagia 2/2  right sided facial, and pharyngeal edema.  Pt tolerated regular diet without difficulty until around Tuesday. Daughter reports she has only had liquids since then.    Maxillofacial and soft tissue neck CT revealed: "Inflammation throughout the right parapharyngeal  space, least affecting the level of the nasopharynx. Asymmetry of the pharynx and supraglottic larynx with evidence of swollen right  lateral pharyngeal wall. But no obvious tonsillar enlargement.  Epiglottis remains within normal limits. Fluid and/or inflammation throughout the retropharyngeal space, most apparent from the C2 to the C4 cervical level as seen on sagittal  image 46 of neck series 5. This is contiguous with both carotid spaces. The left parapharyngeal space is relatively normal.  Noncontrast sublingual space seems to remain within normal limits. Diffuse soft tissue swelling and inflammation throughout the right submandibular space, right submandibular gland is largely obscured. And subcutaneous soft tissue inflammation tracks across midline to the left at the level of the hyoid and submental soft tissues, and the left submandibular space and gland also seems somewhat inflamed.  Diffusely enlarged and inflamed right parotid gland. Widespread regional subcutaneous inflammation, thickening of the platysma, and inflammation in the regional right neck lymph node stations. Contralateral left parotid gland appears within normal limits."    Per ENT: "Right neck and face swelling- this could have various sources but most likely this is sialadenitis of the parotid. Teeth could be etiology but she has no posterior molars and the primary swelling is in the right angle/parotid and laterally and FOM and anterior area where teeth are is not significant. She needs heat, massage and IV antibiotic for the sialadenitis and need to be sure staph is covered. Hydration needed as this is usually the cause."  Pt with difficulty completing OME tasks. It is hard to disentangle if pt is unable to execute some instructions or if she having confusion/lethargy. Reduced mandibular excursion seems likely attributable to significant R edema, but lingual strength and ROM are less clear. Pt with low vocal intensity and hoarseness  which daughter reports is a change from baseline. Seemingly with abnormal resonance as well, suspected to be 2/2 swelling. Today pt exhibited prompt oral response with ice chips. Multiple swallows but no change in vocal quality noted. Pt tolerated thin liquid by spoon and straw sips with no coughing, but did exhibit some facial grimacing and required multiple swallows per bolus. With puree there was prolonged oral phase increased grimacing and cough/throat clear after swallow with pt requiring multiple swallows.  Deficits can likely be attributed to extensive pharyngeal swelling and swallow function should improve as edema resolves.  If any additional medications can be given to reduce inflammation, this may help improve comfort with, and ease and safety or PO intake.    Recommend continuing full liquid diet with thin liquid only. No purees (pudding, oatmeal, grits, etc.)   SLP Visit Diagnosis: Dysphagia, unspecified (R13.10)    Aspiration Risk  Moderate aspiration risk    Diet Recommendation Thin liquid   Liquid Administration via: Straw;Cup;Spoon Medication Administration: Via alternative means (Crush and mix with thin liquid for priority oral meds) Supervision: Staff to assist with self feeding Compensations: Slow rate;Small sips/bites Postural Changes: Seated upright at 90 degrees    Other  Recommendations Oral Care Recommendations: Oral care BID    Recommendations for follow up therapy are one component of a multi-disciplinary discharge planning process, led by the attending physician.  Recommendations may be updated based on patient status, additional functional criteria and insurance authorization.  Follow up Recommendations  (  TBD)      Assistance Recommended at Discharge  N/A  Functional Status Assessment Patient has had a recent decline in their functional status and demonstrates the ability to make significant improvements in function in a reasonable and predictable amount of  time.  Frequency and Duration min 2x/week  2 weeks       Prognosis Prognosis for improved oropharyngeal function: Good      Swallow Study   General Date of Onset: 01/26/23 HPI: MALAINA LESCANO is a 82 y.o. female who was brought in by daughter after noticing right mandibular swelling and redness.  CT Neck: "Inflammation throughout the right parapharyngeal space, least affecting the level of the nasopharynx. Asymmetry of the pharynx and supraglottic larynx with evidence of swollen right lateral pharyngeal wall. But no obvious tonsillar enlargement.  Epiglottis remains within normal limits. Fluid and/or inflammation throughout the retropharyngeal space. ... The left pharyngeal space is relatively normal."  Per ENT: "Right neck and face swelling- this could have various sources but most likely this is sialadenitis of the parotid." CXR 2/9 with no active disease. Pt with history of breast cancer, CKD, anemia of chronic disease, peptic ulcer disease, borderline type 2 diabetes. Type of Study: Bedside Swallow Evaluation Previous Swallow Assessment: None Diet Prior to this Study: Full liquid diet Respiratory Status: Room air History of Recent Intubation: No Behavior/Cognition: Requires cueing;Doesn't follow directions Oral Cavity Assessment: Within Functional Limits Oral Care Completed by SLP: No Oral Cavity - Dentition: Adequate natural dentition;Missing dentition (Fair condition) Self-Feeding Abilities: Needs assist Patient Positioning: Upright in bed Baseline Vocal Quality: Hoarse;Low vocal intensity (?abnormal resonance) Volitional Cough: Weak Volitional Swallow: Unable to elicit    Oral/Motor/Sensory Function Overall Oral Motor/Sensory Function: Moderate impairment Facial ROM: Reduced right Facial Symmetry: Abnormal symmetry right Lingual ROM:  (Could not test) Lingual Symmetry:  (Could not test) Lingual Strength:  (Could not test) Velum:  (Could not test) Mandible: Impaired  (reduced excusion 2/2 edema/pain)   Ice Chips Ice chips: Within functional limits Presentation: Spoon   Thin Liquid Thin Liquid: Impaired Pharyngeal  Phase Impairments: Multiple swallows    Nectar Thick Nectar Thick Liquid: Not tested   Honey Thick Honey Thick Liquid: Not tested   Puree Puree: Impaired Oral Phase Functional Implications: Prolonged oral transit Pharyngeal Phase Impairments: Wet Vocal Quality;Throat Clearing - Immediate;Multiple swallows   Solid     Solid: Not tested      Celedonio Savage, MA, [place] Office: [phone] 01/26/2023,11:59 AM

## 2023-01-26 NOTE — ED Notes (Signed)
Brandon Melnick daughter [phone] call with updates please

## 2023-01-26 NOTE — Consult Note (Signed)
Reason for Consult:right neck and face swelling Referring Physician: Dr Gwenette Greet is an 82 y.o. female.  HPI: hx of right facial swelling for possibly 4 days. She has diabetes. She has been confused and is admitted for multiple medical issues as well as swelling. She has very poor denition. CT scan shows caries but no epicenter to any teeth.   Past Medical History:  Diagnosis Date  . Anemia in chronic kidney disease (CKD)   . Arthritis   . Breast cancer ([place])   . Breast cancer of upper-outer quadrant of right female breast ([place]) 11/06/2014  . Chronic kidney disease (CKD), stage IV (severe) ([place])   . Depression   . Diabetes mellitus   . Hypercholesteremia   . Hypertension   . Insomnia   . Peptic ulcer   . Uterine cancer ([place])    dx in her 48s  . Wears glasses     Past Surgical History:  Procedure Laterality Date  . ABDOMINAL HYSTERECTOMY  1995  . BACK SURGERY  2000   lumb lam  . BIOPSY  05/14/2021   Procedure: BIOPSY;  Surgeon: Doran Stabler, MD;  Location: [facility] ENDOSCOPY;  Service: Gastroenterology;;  . BREAST LUMPECTOMY Right 2015  . COLONOSCOPY    . ESOPHAGOGASTRODUODENOSCOPY N/A 05/14/2021   Procedure: ESOPHAGOGASTRODUODENOSCOPY (EGD);  Surgeon: Doran Stabler, MD;  Location: [place];  Service: Gastroenterology;  Laterality: N/A;  . ESOPHAGOGASTRODUODENOSCOPY (EGD) WITH PROPOFOL N/A 05/26/2021   Procedure: ESOPHAGOGASTRODUODENOSCOPY (EGD) WITH PROPOFOL;  Surgeon: Carol Ada, MD;  Location: [place];  Service: Endoscopy;  Laterality: N/A;  . HEMOSTASIS CLIP PLACEMENT  05/26/2021   Procedure: HEMOSTASIS CLIP PLACEMENT;  Surgeon: Carol Ada, MD;  Location: [place];  Service: Endoscopy;;  . HEMOSTASIS CONTROL  05/14/2021   Procedure: HEMOSTASIS CONTROL;  Surgeon: Doran Stabler, MD;  Location: [place];  Service: Gastroenterology;;  . HOT HEMOSTASIS N/A 05/14/2021   Procedure: HOT HEMOSTASIS (ARGON PLASMA COAGULATION/BICAP);   Surgeon: Doran Stabler, MD;  Location: [place];  Service: Gastroenterology;  Laterality: N/A;  . HOT HEMOSTASIS N/A 05/26/2021   Procedure: HOT HEMOSTASIS (ARGON PLASMA COAGULATION/BICAP);  Surgeon: Carol Ada, MD;  Location: [place];  Service: Endoscopy;  Laterality: N/A;  . ORIF METACARPAL FRACTURE  2012   left  . RADIOACTIVE SEED GUIDED PARTIAL MASTECTOMY WITH AXILLARY SENTINEL LYMPH NODE BIOPSY Right 12/17/2014   Procedure: RIGHT BREAST RADIOACTIVE SEED LOCALIZED LUMPECTOMY AND SENTINEL NODE MAPPING;  Surgeon: Autumn Messing III, MD;  Location: [place];  Service: General;  Laterality: Right;  . SCLEROTHERAPY  05/26/2021   Procedure: Clide Deutscher;  Surgeon: Carol Ada, MD;  Location: [facility] ENDOSCOPY;  Service: Endoscopy;;    Family History  Problem Relation Age of Onset  . Cancer Mother   . Stomach cancer Mother 25  . Heart attack Father   . Cancer Brother   . Lung cancer Brother 22  . Breast cancer Sister 46    Social History:  reports that she quit smoking about 11 years ago. Her smoking use included cigarettes. She smoked an average of .25 packs per day. She has quit using smokeless tobacco.  Her smokeless tobacco use included snuff. She reports that she does not drink alcohol and does not use drugs.  Allergies: No Known Allergies  Medications: I have reviewed the patient's current medications.  Results for orders placed or performed during the hospital encounter of 01/26/23 (from the past 48 hour(s))  Lactic acid, plasma  Status: None   Collection Time: 01/26/23  2:50 AM  Result Value Ref Range   Lactic Acid, Venous 0.8 0.5 - 1.9 mmol/L    Comment: Performed at [facility] [address] [place], [place] [address]  Comprehensive metabolic panel     Status: Abnormal   Collection Time: 01/26/23  2:50 AM  Result Value Ref Range   Sodium 139 135 - 145 mmol/L   Potassium 3.4 (L) 3.5 - 5.1 mmol/L   Chloride 101 98 - 111 mmol/L   CO2 22  22 - 32 mmol/L   Glucose, Bld 131 (H) 70 - 99 mg/dL    Comment: Glucose reference range applies only to samples taken after fasting for at least 8 hours.   BUN 124 (H) 8 - 23 mg/dL   Creatinine, Ser 4.91 (H) 0.44 - 1.00 mg/dL   Calcium 9.4 8.9 - 10.3 mg/dL   Total Protein 6.7 6.5 - 8.1 g/dL   Albumin 3.1 (L) 3.5 - 5.0 g/dL   AST 11 (L) 15 - 41 U/L   ALT 8 0 - 44 U/L   Alkaline Phosphatase 45 38 - 126 U/L   Total Bilirubin 0.8 0.3 - 1.2 mg/dL   GFR, Estimated 8 (L) >60 mL/min    Comment: (NOTE) Calculated using the CKD-EPI Creatinine Equation (2021)    Anion gap 16 (H) 5 - 15    Comment: Performed at [facility], [place] [address] [place], [place] [address]  CBC with Differential     Status: Abnormal   Collection Time: 01/26/23  2:50 AM  Result Value Ref Range   WBC 13.0 (H) 4.0 - 10.5 K/uL   RBC 3.19 (L) 3.87 - 5.11 MIL/uL   Hemoglobin 10.3 (L) 12.0 - 15.0 g/dL   HCT 32.3 (L) 36.0 - 46.0 %   MCV 101.3 (H) 80.0 - 100.0 fL   MCH 32.3 26.0 - 34.0 pg   MCHC 31.9 30.0 - 36.0 g/dL   RDW 14.7 11.5 - 15.5 %   Platelets 171 150 - 400 K/uL   nRBC 0.0 0.0 - 0.2 %   Neutrophils Relative % 78 %   Neutro Abs 10.2 (H) 1.7 - 7.7 K/uL   Lymphocytes Relative 8 %   Lymphs Abs 1.1 0.7 - 4.0 K/uL   Monocytes Relative 13 %   Monocytes Absolute 1.7 (H) 0.1 - 1.0 K/uL   Eosinophils Relative 0 %   Eosinophils Absolute 0.0 0.0 - 0.5 K/uL   Basophils Relative 0 %   Basophils Absolute 0.0 0.0 - 0.1 K/uL   Immature Granulocytes 1 %   Abs Immature Granulocytes 0.06 0.00 - 0.07 K/uL    Comment: Performed at [facility], [place] [address] [place], [place] [address]  Protime-INR     Status: None   Collection Time: 01/26/23  2:50 AM  Result Value Ref Range   Prothrombin Time 15.1 11.4 - 15.2 seconds   INR 1.2 0.8 - 1.2    Comment: (NOTE) INR goal varies based on device and disease states. Performed at [facility], [place] [address], [place], [place] [address]   APTT     Status:  Abnormal   Collection Time: 01/26/23  2:50 AM  Result Value Ref Range   aPTT 41 (H) 24 - 36 seconds    Comment:        IF BASELINE aPTT IS ELEVATED, SUGGEST PATIENT RISK ASSESSMENT BE USED TO DETERMINE APPROPRIATE ANTICOAGULANT THERAPY. Performed at [facility]  [facility], [place] [address], [place], [place] [address]   Lactic acid, plasma     Status: None   Collection Time: 01/26/23  5:56 AM  Result Value Ref Range   Lactic Acid, Venous 0.7 0.5 - 1.9 mmol/L    Comment: Performed at [facility] [address], [place], [place] [address]  Urinalysis, Routine w reflex microscopic -Urine, Clean Catch     Status: Abnormal   Collection Time: 01/26/23  8:15 AM  Result Value Ref Range   Color, Urine YELLOW YELLOW   APPearance CLEAR CLEAR   Specific Gravity, Urine 1.011 1.005 - 1.030   pH 5.0 5.0 - 8.0   Glucose, UA NEGATIVE NEGATIVE mg/dL   Hgb urine dipstick NEGATIVE NEGATIVE   Bilirubin Urine NEGATIVE NEGATIVE   Ketones, ur NEGATIVE NEGATIVE mg/dL   Protein, ur 100 (A) NEGATIVE mg/dL   Nitrite NEGATIVE NEGATIVE   Leukocytes,Ua NEGATIVE NEGATIVE   RBC / HPF 0-5 0 - 5 RBC/hpf   WBC, UA 0-5 0 - 5 WBC/hpf   Bacteria, UA NONE SEEN NONE SEEN   Squamous Epithelial / HPF 0-5 0 - 5 /HPF    Comment: Performed at [facility], [place] [address] [place], [place] [address]  CBC     Status: Abnormal   Collection Time: 01/26/23  8:53 AM  Result Value Ref Range   WBC 14.1 (H) 4.0 - 10.5 K/uL   RBC 3.20 (L) 3.87 - 5.11 MIL/uL   Hemoglobin 10.2 (L) 12.0 - 15.0 g/dL   HCT 32.7 (L) 36.0 - 46.0 %   MCV 102.2 (H) 80.0 - 100.0 fL   MCH 31.9 26.0 - 34.0 pg   MCHC 31.2 30.0 - 36.0 g/dL   RDW 14.6 11.5 - 15.5 %   Platelets 175 150 - 400 K/uL   nRBC 0.0 0.0 - 0.2 %    Comment: Performed at [facility], [place] [address], [place], [place] [address]    CT Maxillofacial Wo Contrast  Result Date: 01/26/2023 CLINICAL DATA:  82 year old female with delirium, redness and swelling at the right  upper jaw, neck soft tissue swelling and induration with pain. The patient has baseline renal insufficiency (baseline creatinine of about 3) and further elevated creatinine in now to 5. IV contrast was deferred. EXAM: CT NECK WITHOUT CONTRAST CT MAXILLOFACIAL WITHOUT CONTRAST TECHNIQUE: Multidetector CT imaging of the neck, face is most was performed following the standard protocol without intravenous contrast. RADIATION DOSE REDUCTION: This exam was performed according to the departmental dose-optimization program which includes automated exposure control, adjustment of the mA and/or kV according to patient size and/or use of iterative reconstruction technique. COMPARISON:  Head CT today reported separately. FINDINGS: Pharynx and larynx: Inflammation throughout the right parapharyngeal space, least affecting the level of the nasopharynx. Asymmetry of the pharynx and supraglottic larynx with evidence of swollen right lateral pharyngeal wall. But no obvious tonsillar enlargement. Epiglottis remains within normal limits. Fluid and/or inflammation throughout the retropharyngeal space, most apparent from the C2 to the C4 cervical level as seen on sagittal image 46 of neck series 5. This is contiguous with both carotid spaces. The left parapharyngeal space is relatively normal. Salivary glands: Noncontrast sublingual space seems to remain within normal limits. Diffuse soft tissue swelling and inflammation throughout the right submandibular space, right submandibular gland is largely obscured. And subcutaneous soft tissue inflammation tracks across midline to the left at the level of the hyoid and submental soft tissues, and the  left submandibular space and gland also seems somewhat inflamed. Diffusely enlarged and inflamed right parotid gland. Widespread regional subcutaneous inflammation, thickening of the platysma, and inflammation in the regional right neck lymph node stations. Contralateral left parotid gland appears  within normal limits. No sialolithiasis identified. No dilatation of the right parotid duct is evident. Thyroid: Heterogeneous.  No significant regional mass effect. Lymph nodes: Inflammation throughout the right neck lymph node stations and small but reactive appearing right level 1, level 2, and level 3 lymph nodes. Evaluation for cystic or necrotic node limited in the of the lack of IV contrast. Contralateral left cervical lymph node stations remain within normal limits. Vascular: Vascular patency is not evaluated in the absence of IV contrast. And there is a conspicuous appearance of the noncontrast right internal jugular vein in the lower neck on coronal image 54 of neck series 6. Superimposed bulky calcified right carotid bifurcation atherosclerosis. Limited intracranial: Head CT is reported separately. Visualized orbits: See Head CT reported separately. Mastoids and visualized paranasal sinuses: Best demonstrated on the face CT, the bilateral tympanic cavities and mastoids are clear. Paranasal sinuses are well aerated with of left sphenoid sinus 16 mm mucous retention cyst and otherwise only mild scattered paranasal sinus mucosal thickening. Nasal cavity demonstrates symmetric mucosal thickening. Skeleton: Best demonstrated on the CT face, much of the posterior mandible and maxillary dentition is absent. The residual mostly anterior maxillary and mandible dentition is diffusely carious and poor. However, periapical lucency more affects the maxilla than the mandible, and none of these seem to be the epicenter of inflammation described above. Mandible overall intact and aligned. Superimposed advanced cervical spine degeneration. No cervical endplate erosion is evident. No acute or suspicious osseous lesion identified. Upper chest: Calcified aortic atherosclerosis. No definite superior mediastinal inflammation. Lung apices are relatively clear with subtle centrilobular emphysema. Other findings: Right  periauricular soft tissue inflammation in association with the right parotid gland findings. But the right pinna does not appear significantly swollen. The superficial portion of the right external auditory canal is inflamed and thickened, but the EAC remains patent. IMPRESSION: 1. Marked inflammatory process throughout the right neck, extending from the superficial subcutaneous tissues into the deep spaces including the right pharynx, parapharyngeal and retropharyngeal spaces. Etiology indeterminate on this noncontrast exam and differential considerations fairly broad, including Severe right Sialadenitis, complicated Pharyngitis, complicated Cellulitis, Retropharyngeal Infection, acute Right Internal Jugular Vein Thrombosis. Recommend a follow-up Neck CT with IV contrast when feasible. Failing that, a noncontrast Neck MRI might allow narrowing of the differential diagnosis. 2. Pertinent negatives include normal right middle ear and mastoids, no significant paranasal sinus inflammation, no obvious osteomyelitis, visible lung apices clear and poor dentition although seems not directly related to the right neck inflammation. 3. Salient findings were discussed by telephone with Erenest Rasher, PA on 01/26/2023 at 0440 hours Electronically Signed   By: Genevie Ann M.D.   On: 01/26/2023 04:58   CT Soft Tissue Neck Wo Contrast  Result Date: 01/26/2023 CLINICAL DATA:  82 year old female with delirium, redness and swelling at the right upper jaw, neck soft tissue swelling and induration with pain. The patient has baseline renal insufficiency (baseline creatinine of about 3) and further elevated creatinine in now to 5. IV contrast was deferred. EXAM: CT NECK WITHOUT CONTRAST CT MAXILLOFACIAL WITHOUT CONTRAST TECHNIQUE: Multidetector CT imaging of the neck, face is most was performed following the standard protocol without intravenous contrast. RADIATION DOSE REDUCTION: This exam was performed according to the  departmental dose-optimization  program which includes automated exposure control, adjustment of the mA and/or kV according to patient size and/or use of iterative reconstruction technique. COMPARISON:  Head CT today reported separately. FINDINGS: Pharynx and larynx: Inflammation throughout the right parapharyngeal space, least affecting the level of the nasopharynx. Asymmetry of the pharynx and supraglottic larynx with evidence of swollen right lateral pharyngeal wall. But no obvious tonsillar enlargement. Epiglottis remains within normal limits. Fluid and/or inflammation throughout the retropharyngeal space, most apparent from the C2 to the C4 cervical level as seen on sagittal image 46 of neck series 5. This is contiguous with both carotid spaces. The left parapharyngeal space is relatively normal. Salivary glands: Noncontrast sublingual space seems to remain within normal limits. Diffuse soft tissue swelling and inflammation throughout the right submandibular space, right submandibular gland is largely obscured. And subcutaneous soft tissue inflammation tracks across midline to the left at the level of the hyoid and submental soft tissues, and the left submandibular space and gland also seems somewhat inflamed. Diffusely enlarged and inflamed right parotid gland. Widespread regional subcutaneous inflammation, thickening of the platysma, and inflammation in the regional right neck lymph node stations. Contralateral left parotid gland appears within normal limits. No sialolithiasis identified. No dilatation of the right parotid duct is evident. Thyroid: Heterogeneous.  No significant regional mass effect. Lymph nodes: Inflammation throughout the right neck lymph node stations and small but reactive appearing right level 1, level 2, and level 3 lymph nodes. Evaluation for cystic or necrotic node limited in the of the lack of IV contrast. Contralateral left cervical lymph node stations remain within normal limits.  Vascular: Vascular patency is not evaluated in the absence of IV contrast. And there is a conspicuous appearance of the noncontrast right internal jugular vein in the lower neck on coronal image 54 of neck series 6. Superimposed bulky calcified right carotid bifurcation atherosclerosis. Limited intracranial: Head CT is reported separately. Visualized orbits: See Head CT reported separately. Mastoids and visualized paranasal sinuses: Best demonstrated on the face CT, the bilateral tympanic cavities and mastoids are clear. Paranasal sinuses are well aerated with of left sphenoid sinus 16 mm mucous retention cyst and otherwise only mild scattered paranasal sinus mucosal thickening. Nasal cavity demonstrates symmetric mucosal thickening. Skeleton: Best demonstrated on the CT face, much of the posterior mandible and maxillary dentition is absent. The residual mostly anterior maxillary and mandible dentition is diffusely carious and poor. However, periapical lucency more affects the maxilla than the mandible, and none of these seem to be the epicenter of inflammation described above. Mandible overall intact and aligned. Superimposed advanced cervical spine degeneration. No cervical endplate erosion is evident. No acute or suspicious osseous lesion identified. Upper chest: Calcified aortic atherosclerosis. No definite superior mediastinal inflammation. Lung apices are relatively clear with subtle centrilobular emphysema. Other findings: Right periauricular soft tissue inflammation in association with the right parotid gland findings. But the right pinna does not appear significantly swollen. The superficial portion of the right external auditory canal is inflamed and thickened, but the EAC remains patent. IMPRESSION: 1. Marked inflammatory process throughout the right neck, extending from the superficial subcutaneous tissues into the deep spaces including the right pharynx, parapharyngeal and retropharyngeal spaces.  Etiology indeterminate on this noncontrast exam and differential considerations fairly broad, including Severe right Sialadenitis, complicated Pharyngitis, complicated Cellulitis, Retropharyngeal Infection, acute Right Internal Jugular Vein Thrombosis. Recommend a follow-up Neck CT with IV contrast when feasible. Failing that, a noncontrast Neck MRI might allow narrowing of the differential diagnosis. 2. Pertinent  negatives include normal right middle ear and mastoids, no significant paranasal sinus inflammation, no obvious osteomyelitis, visible lung apices clear and poor dentition although seems not directly related to the right neck inflammation. 3. Salient findings were discussed by telephone with Erenest Rasher, PA on 01/26/2023 at 0440 hours Electronically Signed   By: Genevie Ann M.D.   On: 01/26/2023 04:58   CT Head Wo Contrast  Result Date: 01/26/2023 CLINICAL DATA:  82 year old female with delirium, redness and swelling at the right upper jaw, neck soft tissue swelling and induration with pain. EXAM: CT HEAD WITHOUT CONTRAST TECHNIQUE: Contiguous axial images were obtained from the base of the skull through the vertex without intravenous contrast. RADIATION DOSE REDUCTION: This exam was performed according to the departmental dose-optimization program which includes automated exposure control, adjustment of the mA and/or kV according to patient size and/or use of iterative reconstruction technique. COMPARISON:  CT face and neck today reported separately. Head CT 09/02/2007. FINDINGS: Brain: Cerebral volume loss since 2008 appears fairly generalized. No midline shift, ventriculomegaly, mass effect, evidence of mass lesion, intracranial hemorrhage or evidence of cortically based acute infarction. Mild to moderate patchy bilateral cerebral white matter hypodensity is new or progressed since 2008. And there is heterogeneity in the right thalamus compatible with a small age indeterminate lacunar infarct  there. No cortical encephalomalacia identified. Vascular: Calcified atherosclerosis at the skull base. No suspicious intracranial vascular hyperdensity. Skull: Osteopenia. Motion artifact at the skull base. No acute osseous abnormality identified. Sinuses/Orbits: Paranasal sinuses are well aerated. There is a left sphenoid mucous retention cysts. Tympanic cavities and mastoids appear clear. Other: Calcified scalp vessel atherosclerosis. Partially visible widespread subcutaneous inflammation in the region of the right ear, tracking inferiorly. See comparison neck and face CTs. Orbits soft tissues appear negative. IMPRESSION: 1. Progressed small vessel disease since 2008, including in the right thalamus which is age indeterminate. 2. No other acute intracranial abnormality identified. 3. Partially visible widespread superficial inflammation surrounding the right ear. But right middle ear and mastoids remain clear. See comparison Neck and Face CT reported separately. Electronically Signed   By: Genevie Ann M.D.   On: 01/26/2023 04:35   DG Chest Port 1 View  Result Date: 01/26/2023 CLINICAL DATA:  Questionable sepsis EXAM: PORTABLE CHEST 1 VIEW COMPARISON:  12/11/2008 FINDINGS: Heart and mediastinal contours are within normal limits. No focal opacities or effusions. No acute bony abnormality. Aortic atherosclerosis. IMPRESSION: No active disease. Electronically Signed   By: Rolm Baptise M.D.   On: 01/26/2023 02:51    ROS Blood pressure (!) 162/88, pulse 79, temperature 98.6 F (37 C), temperature source Oral, resp. rate 17, height 5' 3"$  (1.6 m), weight 71 kg, SpO2 99 %. Physical Exam HENT:     Nose: Nose normal.     Mouth/Throat:     Comments: Trismus and poor dentition but she does open to allow exam with no swelling of the tongue,OP or FOM. She cooperated with exam.  Neck:     Comments: The epicenter of the induration and cellulitis is over the right parotid gland including the tail and posterior  submandibular area. It has firm induration with erythema. It is very tender. No tenderness of significance in the submental area. The thick induration does not extend down neck further than the angle of mandible     Assessment/Plan: Right neck and face swelling- this could have various sources but most likely this is sialadenitis of the parotid. Teeth could be etiology but she has no  posterior molars and the primary swelling is in the right angle/parotid and laterally and FOM and anterior area where teeth are is not significant. She needs heat, massage and IV antibiotic for the sialadenitis and need to be sure staph is covered. Hydration needed as this is usually the cause .   Melissa Montane 01/26/2023, 9:44 AM

## 2023-01-26 NOTE — Consult Note (Signed)
[facility]  INPATIENT CONSULTATION  Reason for Consultation: AKI on CKD Requesting Provider: Dr. Earnest Conroy  HPI: Erica Whitney is an 82 y.o. female with CKD 4 f/b Dr. Royce Macadamia, anemia of CKD, DM, HTN, HL currently admitted for mandibular swelling who is seen for evaluation and management of AKI on CKD.   Presented to ED today with daughter for evaluation of R facial/mandibular swelling.  She's been having increasing pain in the past few days accompanied by decreased po intake.  She was a bit confused and lethargic yesterday so they proceeded to the ED.  Imaging not definitive - ENT suspects sialadenitis and she's been started on abx.  Poss IJ DVT on noncon CT and contrast recommended by avoided in light of AKI/CKD.    Seen in room today - patient sleepy, daughter providing history. Confirms above.    At baseline she's pretty independent - lives alone, can do IADLs, doesn't drive.    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   Chronic kidney disease (CKD), stage IV (severe) (HCC)    Depression    Diabetes mellitus    Hypercholesteremia    Hypertension    Insomnia    Peptic ulcer    Uterine cancer ([place])    dx in her 29s   Wears glasses    PSH: Past Surgical History:  Procedure Laterality Date   ABDOMINAL HYSTERECTOMY  1995   BACK SURGERY  2000   lumb lam   BIOPSY  05/14/2021   Procedure: BIOPSY;  Surgeon: Doran Stabler, MD;  Location[facility] ENDOSCOPY;  Service: Gastroenterology;;   BREAST LUMPECTOMY Right 2015   COLONOSCOPY     ESOPHAGOGASTRODUODENOSCOPY N/A 05/14/2021   Procedure: ESOPHAGOGASTRODUODENOSCOPY (EGD);  Surgeon: Doran Stabler, MD;  Location: [place];  Service: Gastroenterology;  Laterality: N/A;   ESOPHAGOGASTRODUODENOSCOPY (EGD) WITH PROPOFOL N/A 05/26/2021   Procedure:  ESOPHAGOGASTRODUODENOSCOPY (EGD) WITH PROPOFOL;  Surgeon: Carol Ada, MD;  Location: [place];  Service: Endoscopy;  Laterality: N/A;   HEMOSTASIS CLIP PLACEMENT  05/26/2021   Procedure: HEMOSTASIS CLIP PLACEMENT;  Surgeon: Carol Ada, MD;  Location: [place];  Service: Endoscopy;;   HEMOSTASIS CONTROL  05/14/2021   Procedure: HEMOSTASIS CONTROL;  Surgeon: Doran Stabler, MD;  Location: [place];  Service: Gastroenterology;;   HOT HEMOSTASIS N/A 05/14/2021   Procedure: HOT HEMOSTASIS (ARGON PLASMA COAGULATION/BICAP);  Surgeon: Doran Stabler, MD;  Location: [place];  Service: Gastroenterology;  Laterality: N/A;   HOT HEMOSTASIS N/A 05/26/2021   Procedure: HOT HEMOSTASIS (ARGON PLASMA COAGULATION/BICAP);  Surgeon: Carol Ada, MD;  Location: [place];  Service: Endoscopy;  Laterality: N/A;   ORIF METACARPAL FRACTURE  2012   left   RADIOACTIVE SEED GUIDED PARTIAL MASTECTOMY WITH AXILLARY SENTINEL LYMPH NODE BIOPSY Right 12/17/2014   Procedure: RIGHT BREAST RADIOACTIVE SEED LOCALIZED LUMPECTOMY AND SENTINEL NODE MAPPING;  Surgeon: Autumn Messing III, MD;  Location: [place];  Service: General;  Laterality: Right;   SCLEROTHERAPY  05/26/2021   Procedure: Clide Deutscher;  Surgeon: Carol Ada, MD;  Location: [facility] ENDOSCOPY;  Service: Endoscopy;;    Past Medical History:  Diagnosis Date   Anemia in chronic kidney disease (CKD)    Arthritis    Breast cancer ([place])    Breast cancer of upper-outer quadrant of right female  breast ([place]) 11/06/2014   Chronic kidney disease (CKD), stage IV (severe) (HCC)    Depression    Diabetes mellitus    Hypercholesteremia    Hypertension    Insomnia    Peptic ulcer    Uterine cancer ([place])    dx in her 41s   Wears glasses     Medications:  I have reviewed the patient's current medications.  Medications Prior to Admission  Medication Sig Dispense Refill   acetaminophen (TYLENOL) 650 MG CR tablet Take 1,300 mg by  mouth as needed for pain.     amLODipine (NORVASC) 10 MG tablet Take 1 tablet (10 mg total) by mouth daily. 90 tablet 1   atorvastatin (LIPITOR) 40 MG tablet Take 40 mg by mouth daily.     calcitRIOL (ROCALTROL) 0.25 MCG capsule Take 0.25 mcg by mouth 3 (three) times a week. MWF     Camphor-Menthol-Methyl Sal (SALONPAS EX) Apply 1 patch topically as needed (pain).     cycloSPORINE (RESTASIS) 0.05 % ophthalmic emulsion Place 1 drop into both eyes 2 (two) times daily.     ferrous sulfate 325 (65 FE) MG tablet Take 1 tablet (325 mg total) by mouth daily with breakfast. 30 tablet 1   furosemide (LASIX) 40 MG tablet Take 1 tablet (40 mg total) by mouth daily as needed for fluid (ankle swelling). (Patient taking differently: Take 40 mg by mouth daily.) 30 tablet    gabapentin (NEURONTIN) 100 MG capsule Take 100 mg by mouth 2 (two) times daily.     hydrALAZINE (APRESOLINE) 100 MG tablet Take 100 mg by mouth 3 (three) times daily.     metoprolol succinate (TOPROL-XL) 25 MG 24 hr tablet Take 25 mg by mouth daily.     pantoprazole (PROTONIX) 40 MG tablet Take 1 tablet (40 mg total) by mouth 2 (two) times daily before a meal for 30 days, THEN 1 tablet (40 mg total) daily. (Patient taking differently: Take 40 mg by mouth in the morning ) 120 tablet 0   sertraline (ZOLOFT) 100 MG tablet Take 100 mg by mouth every morning.     traZODone (DESYREL) 50 MG tablet Take 50 mg by mouth at bedtime.     hydrALAZINE (APRESOLINE) 50 MG tablet Take 1 tablet (50 mg total) by mouth 3 (three) times daily. (Patient not taking: Reported on 01/26/2023) 90 tablet 1   mirtazapine (REMERON) 30 MG tablet Take 30 mg by mouth at bedtime. (Patient not taking: Reported on 01/26/2023)     senna-docusate (SENOKOT-S) 8.6-50 MG tablet Take 1 tablet by mouth at bedtime as needed for mild constipation or moderate constipation. (Patient not taking: Reported on 01/26/2023) 60 tablet 0    ALLERGIES:  No Known Allergies  FAM HX: Family History   Problem Relation Age of Onset   Cancer Mother    Stomach cancer Mother 51   Heart attack Father    Cancer Brother    Lung cancer Brother 49   Breast cancer Sister 47    Social History:   reports that she quit smoking about 11 years ago. Her smoking use included cigarettes. She smoked an average of .25 packs per day. She has quit using smokeless tobacco.  Her smokeless tobacco use included snuff. She reports that she does not drink alcohol and does not use drugs.  ROS: 12 system ROS neg except per HPI  Blood pressure 105/86, pulse 81, temperature 99.7 F (37.6 C), resp. rate 19, height 5' 3"$  (1.6 m), weight 71 kg,  SpO2 94 %. PHYSICAL EXAM: Gen: elderly woman lying calmly in bed, arousable to voice  Eyes: anicteric  ENT: MM tacky, R mandibular swelling and TTP, poor dentition Neck: no swelling or JVD CV: RRR no rub Abd: soft Lungs: clear GU: no foley, purewick has just been inserted and no urine in can yet Extr: no edema Neuro:  arousable - hard of hearing, AOx3, not providing many details   Results for orders placed or performed during the hospital encounter of 01/26/23 (from the past 48 hour(s))  Lactic acid, plasma     Status: None   Collection Time: 01/26/23  2:50 AM  Result Value Ref Range   Lactic Acid, Venous 0.8 0.5 - 1.9 mmol/L    Comment: Performed at [facility], [address] [place], [place] [address]  Comprehensive metabolic panel     Status: Abnormal   Collection Time: 01/26/23  2:50 AM  Result Value Ref Range   Sodium 139 135 - 145 mmol/L   Potassium 3.4 (L) 3.5 - 5.1 mmol/L   Chloride 101 98 - 111 mmol/L   CO2 22 22 - 32 mmol/L   Glucose, Bld 131 (H) 70 - 99 mg/dL    Comment: Glucose reference range applies only to samples taken after fasting for at least 8 hours.   BUN 124 (H) 8 - 23 mg/dL   Creatinine, Ser 4.91 (H) 0.44 - 1.00 mg/dL   Calcium 9.4 8.9 - 10.3 mg/dL   Total Protein 6.7 6.5 - 8.1 g/dL   Albumin 3.1 (L) 3.5 - 5.0 g/dL   AST 11  (L) 15 - 41 U/L   ALT 8 0 - 44 U/L   Alkaline Phosphatase 45 38 - 126 U/L   Total Bilirubin 0.8 0.3 - 1.2 mg/dL   GFR, Estimated 8 (L) >60 mL/min    Comment: (NOTE) Calculated using the CKD-EPI Creatinine Equation (2021)    Anion gap 16 (H) 5 - 15    Comment: Performed at [facility], [place] [address], [place], [place] [address]  CBC with Differential     Status: Abnormal   Collection Time: 01/26/23  2:50 AM  Result Value Ref Range   WBC 13.0 (H) 4.0 - 10.5 K/uL   RBC 3.19 (L) 3.87 - 5.11 MIL/uL   Hemoglobin 10.3 (L) 12.0 - 15.0 g/dL   HCT 32.3 (L) 36.0 - 46.0 %   MCV 101.3 (H) 80.0 - 100.0 fL   MCH 32.3 26.0 - 34.0 pg   MCHC 31.9 30.0 - 36.0 g/dL   RDW 14.7 11.5 - 15.5 %   Platelets 171 150 - 400 K/uL   nRBC 0.0 0.0 - 0.2 %   Neutrophils Relative % 78 %   Neutro Abs 10.2 (H) 1.7 - 7.7 K/uL   Lymphocytes Relative 8 %   Lymphs Abs 1.1 0.7 - 4.0 K/uL   Monocytes Relative 13 %   Monocytes Absolute 1.7 (H) 0.1 - 1.0 K/uL   Eosinophils Relative 0 %   Eosinophils Absolute 0.0 0.0 - 0.5 K/uL   Basophils Relative 0 %   Basophils Absolute 0.0 0.0 - 0.1 K/uL   Immature Granulocytes 1 %   Abs Immature Granulocytes 0.06 0.00 - 0.07 K/uL    Comment: Performed at [facility], [place] [address], [place], [place] [address]  Protime-INR     Status: None   Collection Time: 01/26/23  2:50 AM  Result Value Ref Range   Prothrombin Time 15.1 11.4 -  15.2 seconds   INR 1.2 0.8 - 1.2    Comment: (NOTE) INR goal varies based on device and disease states. Performed at [facility], [place] [address] [place], [place] [address]   APTT     Status: Abnormal   Collection Time: 01/26/23  2:50 AM  Result Value Ref Range   aPTT 41 (H) 24 - 36 seconds    Comment:        IF BASELINE aPTT IS ELEVATED, SUGGEST PATIENT RISK ASSESSMENT BE USED TO DETERMINE APPROPRIATE ANTICOAGULANT THERAPY. Performed at [facility], [place] [address] [place], [place] [address]   Hemoglobin A1c      Status: Abnormal   Collection Time: 01/26/23  2:50 AM  Result Value Ref Range   Hgb A1c MFr Bld 4.4 (L) 4.8 - 5.6 %    Comment: (NOTE) Pre diabetes:          5.7%-6.4%  Diabetes:              >6.4%  Glycemic control for   <7.0% adults with diabetes    Mean Plasma Glucose 79.58 mg/dL    Comment: Performed at [facility] [address], [place], [place] [address]  Blood Culture (routine x 2)     Status: None (Preliminary result)   Collection Time: 01/26/23  3:00 AM   Specimen: BLOOD  Result Value Ref Range   Specimen Description BLOOD LEFT ANTECUBITAL    Special Requests      BOTTLES DRAWN AEROBIC AND ANAEROBIC Blood Culture adequate volume   Culture      NO GROWTH < 12 HOURS Performed at [facility], [place] [address] [place], [place] [address]    Report Status PENDING   Blood Culture (routine x 2)     Status: None (Preliminary result)   Collection Time: 01/26/23  3:00 AM   Specimen: BLOOD LEFT WRIST  Result Value Ref Range   Specimen Description BLOOD LEFT WRIST    Special Requests      BOTTLES DRAWN AEROBIC AND ANAEROBIC Blood Culture results may not be optimal due to an inadequate volume of blood received in culture bottles   Culture      NO GROWTH < 12 HOURS Performed at [facility], [place] [address], [place], [place] [address]    Report Status PENDING   Lactic acid, plasma     Status: None   Collection Time: 01/26/23  5:56 AM  Result Value Ref Range   Lactic Acid, Venous 0.7 0.5 - 1.9 mmol/L    Comment: Performed at [facility] [address] [place], [place] [address]  Urinalysis, Routine w reflex microscopic -Urine, Clean Catch     Status: Abnormal   Collection Time: 01/26/23  8:15 AM  Result Value Ref Range   Color, Urine YELLOW YELLOW   APPearance CLEAR CLEAR   Specific Gravity, Urine 1.011 1.005 - 1.030   pH 5.0 5.0 - 8.0   Glucose, UA NEGATIVE NEGATIVE mg/dL   Hgb urine dipstick NEGATIVE NEGATIVE   Bilirubin Urine NEGATIVE  NEGATIVE   Ketones, ur NEGATIVE NEGATIVE mg/dL   Protein, ur 100 (A) NEGATIVE mg/dL   Nitrite NEGATIVE NEGATIVE   Leukocytes,Ua NEGATIVE NEGATIVE   RBC / HPF 0-5 0 - 5 RBC/hpf   WBC, UA 0-5 0 - 5 WBC/hpf   Bacteria, UA NONE SEEN NONE SEEN   Squamous Epithelial / HPF 0-5 0 - 5 /HPF    Comment:  Performed at [facility], [place] [address], [place], [place] [address]  CBC     Status: Abnormal   Collection Time: 01/26/23  8:53 AM  Result Value Ref Range   WBC 14.1 (H) 4.0 - 10.5 K/uL   RBC 3.20 (L) 3.87 - 5.11 MIL/uL   Hemoglobin 10.2 (L) 12.0 - 15.0 g/dL   HCT 32.7 (L) 36.0 - 46.0 %   MCV 102.2 (H) 80.0 - 100.0 fL   MCH 31.9 26.0 - 34.0 pg   MCHC 31.2 30.0 - 36.0 g/dL   RDW 14.6 11.5 - 15.5 %   Platelets 175 150 - 400 K/uL   nRBC 0.0 0.0 - 0.2 %    Comment: Performed at [facility], [place] [address] [place], [place] [address]  Creatinine, serum     Status: Abnormal   Collection Time: 01/26/23  8:53 AM  Result Value Ref Range   Creatinine, Ser 4.60 (H) 0.44 - 1.00 mg/dL   GFR, Estimated 9 (L) >60 mL/min    Comment: (NOTE) Calculated using the CKD-EPI Creatinine Equation (2021) Performed at [facility] [address], [place], [place] [address]     US RENAL  Result Date: 01/26/2023 CLINICAL DATA:  Acute kidney injury. EXAM: RENAL / URINARY TRACT ULTRASOUND COMPLETE COMPARISON:  Renal ultrasound 04/19/2021 FINDINGS: Right Kidney: Renal measurements: 7.4 x 3.4 x 4.6 cm = volume: 61 mL. Echogenicity within normal limits. No mass or hydronephrosis visualized. Left Kidney: Renal measurements: 9.6 x 4.0 x 6.3 cm = volume: 127 mL. Increased parenchymal echogenicity. No solid mass or hydronephrosis. Unchanged 3.2 cm simple appearing cyst for which no follow-up imaging is recommended. Bladder: Appears normal for degree of bladder distention. Other: None. IMPRESSION: 1. No hydronephrosis. 2. Chronic, asymmetrically severe right renal atrophy. Electronically Signed   By: Logan Bores M.D.   On: 01/26/2023 10:34   CT Maxillofacial Wo Contrast  Result Date: 01/26/2023 CLINICAL DATA:  82 year old female with delirium, redness and swelling at the right upper jaw, neck soft tissue swelling and induration with pain. The patient has baseline renal insufficiency (baseline creatinine of about 3) and further elevated creatinine in now to 5. IV contrast was deferred. EXAM: CT NECK WITHOUT CONTRAST CT MAXILLOFACIAL WITHOUT CONTRAST TECHNIQUE: Multidetector CT imaging of the neck, face is most was performed following the standard protocol without intravenous contrast. RADIATION DOSE REDUCTION: This exam was performed according to the departmental dose-optimization program which includes automated exposure control, adjustment of the mA and/or kV according to patient size and/or use of iterative reconstruction technique. COMPARISON:  Head CT today reported separately. FINDINGS: Pharynx and larynx: Inflammation throughout the right parapharyngeal space, least affecting the level of the nasopharynx. Asymmetry of the pharynx and supraglottic larynx with evidence of swollen right lateral pharyngeal wall. But no obvious tonsillar enlargement. Epiglottis remains within normal limits. Fluid and/or inflammation throughout the retropharyngeal space, most apparent from the C2 to the C4 cervical level as seen on sagittal image 46 of neck series 5. This is contiguous with both carotid spaces. The left parapharyngeal space is relatively normal. Salivary glands: Noncontrast sublingual space seems to remain within normal limits. Diffuse soft tissue swelling and inflammation throughout the right submandibular space, right submandibular gland is largely obscured. And subcutaneous soft tissue inflammation tracks across midline to the left at the level of the hyoid and submental soft tissues, and the left submandibular space and gland also seems somewhat inflamed. Diffusely enlarged and inflamed right parotid gland.  Widespread regional subcutaneous inflammation, thickening of the platysma, and inflammation in the regional right neck lymph node stations. Contralateral left parotid gland appears within normal limits. No sialolithiasis identified. No dilatation of the right parotid duct is evident. Thyroid: Heterogeneous.  No significant regional mass effect. Lymph nodes: Inflammation throughout the right neck lymph node stations and small but reactive appearing right level 1, level 2, and level 3 lymph nodes. Evaluation for cystic or necrotic node limited in the of the lack of IV contrast. Contralateral left cervical lymph node stations remain within normal limits. Vascular: Vascular patency is not evaluated in the absence of IV contrast. And there is a conspicuous appearance of the noncontrast right internal jugular vein in the lower neck on coronal image 54 of neck series 6. Superimposed bulky calcified right carotid bifurcation atherosclerosis. Limited intracranial: Head CT is reported separately. Visualized orbits: See Head CT reported separately. Mastoids and visualized paranasal sinuses: Best demonstrated on the face CT, the bilateral tympanic cavities and mastoids are clear. Paranasal sinuses are well aerated with of left sphenoid sinus 16 mm mucous retention cyst and otherwise only mild scattered paranasal sinus mucosal thickening. Nasal cavity demonstrates symmetric mucosal thickening. Skeleton: Best demonstrated on the CT face, much of the posterior mandible and maxillary dentition is absent. The residual mostly anterior maxillary and mandible dentition is diffusely carious and poor. However, periapical lucency more affects the maxilla than the mandible, and none of these seem to be the epicenter of inflammation described above. Mandible overall intact and aligned. Superimposed advanced cervical spine degeneration. No cervical endplate erosion is evident. No acute or suspicious osseous lesion identified. Upper chest:  Calcified aortic atherosclerosis. No definite superior mediastinal inflammation. Lung apices are relatively clear with subtle centrilobular emphysema. Other findings: Right periauricular soft tissue inflammation in association with the right parotid gland findings. But the right pinna does not appear significantly swollen. The superficial portion of the right external auditory canal is inflamed and thickened, but the EAC remains patent. IMPRESSION: 1. Marked inflammatory process throughout the right neck, extending from the superficial subcutaneous tissues into the deep spaces including the right pharynx, parapharyngeal and retropharyngeal spaces. Etiology indeterminate on this noncontrast exam and differential considerations fairly broad, including Severe right Sialadenitis, complicated Pharyngitis, complicated Cellulitis, Retropharyngeal Infection, acute Right Internal Jugular Vein Thrombosis. Recommend a follow-up Neck CT with IV contrast when feasible. Failing that, a noncontrast Neck MRI might allow narrowing of the differential diagnosis. 2. Pertinent negatives include normal right middle ear and mastoids, no significant paranasal sinus inflammation, no obvious osteomyelitis, visible lung apices clear and poor dentition although seems not directly related to the right neck inflammation. 3. Salient findings were discussed by telephone with Erenest Rasher, PA on 01/26/2023 at 0440 hours Electronically Signed   By: Genevie Ann M.D.   On: 01/26/2023 04:58   CT Soft Tissue Neck Wo Contrast  Result Date: 01/26/2023 CLINICAL DATA:  82 year old female with delirium, redness and swelling at the right upper jaw, neck soft tissue swelling and induration with pain. The patient has baseline renal insufficiency (baseline creatinine of about 3) and further elevated creatinine in now to 5. IV contrast was deferred. EXAM: CT NECK WITHOUT CONTRAST CT MAXILLOFACIAL WITHOUT CONTRAST TECHNIQUE: Multidetector CT imaging of  the neck, face is most was performed following the standard protocol without intravenous contrast. RADIATION DOSE REDUCTION: This exam was performed according to the departmental dose-optimization program which includes automated exposure control, adjustment of the mA and/or kV according to patient size and/or  use of iterative reconstruction technique. COMPARISON:  Head CT today reported separately. FINDINGS: Pharynx and larynx: Inflammation throughout the right parapharyngeal space, least affecting the level of the nasopharynx. Asymmetry of the pharynx and supraglottic larynx with evidence of swollen right lateral pharyngeal wall. But no obvious tonsillar enlargement. Epiglottis remains within normal limits. Fluid and/or inflammation throughout the retropharyngeal space, most apparent from the C2 to the C4 cervical level as seen on sagittal image 46 of neck series 5. This is contiguous with both carotid spaces. The left parapharyngeal space is relatively normal. Salivary glands: Noncontrast sublingual space seems to remain within normal limits. Diffuse soft tissue swelling and inflammation throughout the right submandibular space, right submandibular gland is largely obscured. And subcutaneous soft tissue inflammation tracks across midline to the left at the level of the hyoid and submental soft tissues, and the left submandibular space and gland also seems somewhat inflamed. Diffusely enlarged and inflamed right parotid gland. Widespread regional subcutaneous inflammation, thickening of the platysma, and inflammation in the regional right neck lymph node stations. Contralateral left parotid gland appears within normal limits. No sialolithiasis identified. No dilatation of the right parotid duct is evident. Thyroid: Heterogeneous.  No significant regional mass effect. Lymph nodes: Inflammation throughout the right neck lymph node stations and small but reactive appearing right level 1, level 2, and level 3 lymph  nodes. Evaluation for cystic or necrotic node limited in the of the lack of IV contrast. Contralateral left cervical lymph node stations remain within normal limits. Vascular: Vascular patency is not evaluated in the absence of IV contrast. And there is a conspicuous appearance of the noncontrast right internal jugular vein in the lower neck on coronal image 54 of neck series 6. Superimposed bulky calcified right carotid bifurcation atherosclerosis. Limited intracranial: Head CT is reported separately. Visualized orbits: See Head CT reported separately. Mastoids and visualized paranasal sinuses: Best demonstrated on the face CT, the bilateral tympanic cavities and mastoids are clear. Paranasal sinuses are well aerated with of left sphenoid sinus 16 mm mucous retention cyst and otherwise only mild scattered paranasal sinus mucosal thickening. Nasal cavity demonstrates symmetric mucosal thickening. Skeleton: Best demonstrated on the CT face, much of the posterior mandible and maxillary dentition is absent. The residual mostly anterior maxillary and mandible dentition is diffusely carious and poor. However, periapical lucency more affects the maxilla than the mandible, and none of these seem to be the epicenter of inflammation described above. Mandible overall intact and aligned. Superimposed advanced cervical spine degeneration. No cervical endplate erosion is evident. No acute or suspicious osseous lesion identified. Upper chest: Calcified aortic atherosclerosis. No definite superior mediastinal inflammation. Lung apices are relatively clear with subtle centrilobular emphysema. Other findings: Right periauricular soft tissue inflammation in association with the right parotid gland findings. But the right pinna does not appear significantly swollen. The superficial portion of the right external auditory canal is inflamed and thickened, but the EAC remains patent. IMPRESSION: 1. Marked inflammatory process throughout  the right neck, extending from the superficial subcutaneous tissues into the deep spaces including the right pharynx, parapharyngeal and retropharyngeal spaces. Etiology indeterminate on this noncontrast exam and differential considerations fairly broad, including Severe right Sialadenitis, complicated Pharyngitis, complicated Cellulitis, Retropharyngeal Infection, acute Right Internal Jugular Vein Thrombosis. Recommend a follow-up Neck CT with IV contrast when feasible. Failing that, a noncontrast Neck MRI might allow narrowing of the differential diagnosis. 2. Pertinent negatives include normal right middle ear and mastoids, no significant paranasal sinus inflammation, no obvious osteomyelitis, visible  lung apices clear and poor dentition although seems not directly related to the right neck inflammation. 3. Salient findings were discussed by telephone with Erenest Rasher, PA on 01/26/2023 at 0440 hours Electronically Signed   By: Genevie Ann M.D.   On: 01/26/2023 04:58   CT Head Wo Contrast  Result Date: 01/26/2023 CLINICAL DATA:  82 year old female with delirium, redness and swelling at the right upper jaw, neck soft tissue swelling and induration with pain. EXAM: CT HEAD WITHOUT CONTRAST TECHNIQUE: Contiguous axial images were obtained from the base of the skull through the vertex without intravenous contrast. RADIATION DOSE REDUCTION: This exam was performed according to the departmental dose-optimization program which includes automated exposure control, adjustment of the mA and/or kV according to patient size and/or use of iterative reconstruction technique. COMPARISON:  CT face and neck today reported separately. Head CT 09/02/2007. FINDINGS: Brain: Cerebral volume loss since 2008 appears fairly generalized. No midline shift, ventriculomegaly, mass effect, evidence of mass lesion, intracranial hemorrhage or evidence of cortically based acute infarction. Mild to moderate patchy bilateral cerebral white  matter hypodensity is new or progressed since 2008. And there is heterogeneity in the right thalamus compatible with a small age indeterminate lacunar infarct there. No cortical encephalomalacia identified. Vascular: Calcified atherosclerosis at the skull base. No suspicious intracranial vascular hyperdensity. Skull: Osteopenia. Motion artifact at the skull base. No acute osseous abnormality identified. Sinuses/Orbits: Paranasal sinuses are well aerated. There is a left sphenoid mucous retention cysts. Tympanic cavities and mastoids appear clear. Other: Calcified scalp vessel atherosclerosis. Partially visible widespread subcutaneous inflammation in the region of the right ear, tracking inferiorly. See comparison neck and face CTs. Orbits soft tissues appear negative. IMPRESSION: 1. Progressed small vessel disease since 2008, including in the right thalamus which is age indeterminate. 2. No other acute intracranial abnormality identified. 3. Partially visible widespread superficial inflammation surrounding the right ear. But right middle ear and mastoids remain clear. See comparison Neck and Face CT reported separately. Electronically Signed   By: Genevie Ann M.D.   On: 01/26/2023 04:35   DG Chest Port 1 View  Result Date: 01/26/2023 CLINICAL DATA:  Questionable sepsis EXAM: PORTABLE CHEST 1 VIEW COMPARISON:  12/11/2008 FINDINGS: Heart and mediastinal contours are within normal limits. No focal opacities or effusions. No acute bony abnormality. Aortic atherosclerosis. IMPRESSION: No active disease. Electronically Signed   By: Rolm Baptise M.D.   On: 01/26/2023 02:51    Assessment/Plan **mandibular swelling:  Imaging inconclusive.  ENT c/s - likely sialadenitis.  Being covered with appropriate antibiotics, heat therapy.    **AKI on CKD 4: Baseline Cr low 3s.  Now presenting with BUN 124, Cr 4.9 in setting of poor po intake related to mandibular swelling. Renal US no obstruction.  UA bland except mild protein (not  a new finding). Electrolytes/bicarb acceptable.  Suspect she is a bit uremic with her mild lethargy, but I don't think she needs urgent hemodialysis.   DIscussed with daughter -- she would never accept dialysis per prior conversation.    Will proceed with continued IV hydration and supportive care, following serial labs.  Strict I/Os.  Avoid nephrotoxins, avoid hypotension.  I think she's likely to turn around.   **Possible RIJ DVT: agree with avoiding iodinated contrast, per primary.   **Anemia:  receives ESA q2wks outpt and IV iron PRN.  Hb 10s, stable.   **HTN: high in setting of missed meds -- PRNs ordered, hopefully can resume home meds soon.   **DM: per primary  Will follow, please reach out with concerns.   Justin Mend 01/26/2023, 7:35 PM

## 2023-01-27 DIAGNOSIS — L03221 Cellulitis of neck: Secondary | ICD-10-CM

## 2023-01-27 DIAGNOSIS — L03211 Cellulitis of face: Secondary | ICD-10-CM | POA: Diagnosis not present

## 2023-01-27 DIAGNOSIS — N184 Chronic kidney disease, stage 4 (severe): Secondary | ICD-10-CM | POA: Diagnosis not present

## 2023-01-27 DIAGNOSIS — N179 Acute kidney failure, unspecified: Secondary | ICD-10-CM | POA: Diagnosis not present

## 2023-01-27 LAB — BASIC METABOLIC PANEL WITH GFR
Anion gap: 13 (ref 5–15)
BUN: 109 mg/dL — ABNORMAL HIGH (ref 8–23)
CO2: 23 mmol/L (ref 22–32)
Calcium: 8.9 mg/dL (ref 8.9–10.3)
Chloride: 102 mmol/L (ref 98–111)
Creatinine, Ser: 4.44 mg/dL — ABNORMAL HIGH (ref 0.44–1.00)
GFR, Estimated: 10 mL/min — ABNORMAL LOW (ref >60)
Glucose, Bld: 122 mg/dL — ABNORMAL HIGH (ref 70–99)
Potassium: 3.1 mmol/L — ABNORMAL LOW (ref 3.5–5.1)
Sodium: 138 mmol/L (ref 135–145)

## 2023-01-27 LAB — CBC
HCT: 31 % — ABNORMAL LOW (ref 36.0–46.0)
Hemoglobin: 10.1 g/dL — ABNORMAL LOW (ref 12.0–15.0)
MCH: 32.5 pg (ref 26.0–34.0)
MCHC: 32.6 g/dL (ref 30.0–36.0)
MCV: 99.7 fL (ref 80.0–100.0)
Platelets: 194 K/uL (ref 150–400)
RBC: 3.11 MIL/uL — ABNORMAL LOW (ref 3.87–5.11)
RDW: 14.7 % (ref 11.5–15.5)
WBC: 14 K/uL — ABNORMAL HIGH (ref 4.0–10.5)
nRBC: 0 % (ref 0.0–0.2)

## 2023-01-27 MED ORDER — LACTATED RINGERS IV SOLN: INTRAVENOUS | Status: AC

## 2023-01-27 MED ORDER — POTASSIUM CHLORIDE 20 MEQ PO PACK
40.0000 meq | PACK | Freq: Once | ORAL | Status: AC
  Administered 2023-01-27: 40 meq via ORAL
  Filled 2023-01-27: qty 2

## 2023-01-27 NOTE — Progress Notes (Signed)
[facility] PROGRESS NOTE    Progress Note  Erica Whitney  [id number] DOB: [birth year] DOA: 01/26/2023 PCP: Nolene Ebbs, MD     Brief Narrative:   Erica Whitney is an 82 y.o. female past medical history of breast cancer, chronic NEC stage IIIa, anemia of chronic disease, history of peptic ulcer borderline diabetes mellitus brought in by the daughter noticing right mandibular swelling and redness, according to the daughter she is only being tolerating liquids over the last 3 to 4 days noticed yesterday her face was swollen extending into the neck and patient appeared confused white count of 13,000 and was found to have facial cellulitis.  Assessment/Plan:   Right facial Cellulitis: CT of the maxillofacial showed inflammatory process of the right neck extending from the superficial subcutaneous tissue to the deep space tissue including the right pharynx para pharynx and intra pharyngeal space. She was started on IV clindamycin and ENT was consulted.  ENT there is most likely cellulitis of the parotid we recommended a diet IV antibiotics and warm otologist Blood cultures have been sent. Bedside point-of-care ultrasound done by the ED showed no DVT and entirely compressible veins. Speech evaluated the patient recommended thin liquid diet.  Acute on chronic kidney disease stage IV: With a baseline creatinine of 3-4, on admission creatinine 4.9 likely prerenal azotemia in the setting of decreased oral intake. She was started on aggressive IV fluid hydration nephrology was consulted they recommended renal ultrasound showed chronic renal disease asymmetric severe on the right no hydronephrosis. There is no indication for dialysis right now, but the nephrologist spoke to the daughter and she would never want dialysis.  Will continue with supportive care avoid nephrotoxic drug. Creatinine is slowly improving with IV hydration.  Acute metabolic encephalopathy: Multifactorial  likely due to infectious etiology, pharmacology as she takes Neurontin plus or minus elevated BUN. Started on IV fluids and creatinine is slowly improving.  Anemia of chronic renal disease: Hemoglobin stable continue to monitor.  Essential hypertension: Blood pressure is elevated likely due to unable to take her medication. Was started on hydralazine IV as needed blood pressure slowly trending down when she is able to take orals we will go ahead and start her home regimen.  Diabetes mellitus type 2 uncontrolled: Hemoglobin A1c of 4.4 which means her blood glucose actually well-controlled she is currently off any oral hypoglycemic agents at home there is probably due to to the deterioration of renal function.  Peptic ulcer disease: Continue PPI.  History of breast cancer: Noted.   DVT prophylaxis: lovenox Family Communication:Daughter Status is: Inpatient Remains inpatient appropriate because: Right facial cellulitis    Code Status:     Code Status Orders  (From admission, onward)           Start     Ordered   01/26/23 0743  Full code  Continuous       Question:  By:  Answer:  Consent: discussion documented in EHR   01/26/23 0750           Code Status History     Date Active Date Inactive Code Status Order ID Comments User Context   05/25/2021 2115 05/30/2021 2024 Full Code [id number]  Orma Flaming, MD ED   05/13/2021 1446 05/16/2021 1821 Full Code [id number]  Norins, Heinz Knuckles, MD ED         IV Access:   Peripheral IV   Procedures and diagnostic studies:   US RENAL  Result Date: 01/26/2023 CLINICAL DATA:  Acute kidney injury. EXAM: RENAL / URINARY TRACT ULTRASOUND COMPLETE COMPARISON:  Renal ultrasound 04/19/2021 FINDINGS: Right Kidney: Renal measurements: 7.4 x 3.4 x 4.6 cm = volume: 61 mL. Echogenicity within normal limits. No mass or hydronephrosis visualized. Left Kidney: Renal measurements: 9.6 x 4.0 x 6.3 cm = volume: 127 mL. Increased parenchymal  echogenicity. No solid mass or hydronephrosis. Unchanged 3.2 cm simple appearing cyst for which no follow-up imaging is recommended. Bladder: Appears normal for degree of bladder distention. Other: None. IMPRESSION: 1. No hydronephrosis. 2. Chronic, asymmetrically severe right renal atrophy. Electronically Signed   By: Logan Bores M.D.   On: 01/26/2023 10:34   CT Maxillofacial Wo Contrast  Result Date: 01/26/2023 CLINICAL DATA:  82 year old female with delirium, redness and swelling at the right upper jaw, neck soft tissue swelling and induration with pain. The patient has baseline renal insufficiency (baseline creatinine of about 3) and further elevated creatinine in now to 5. IV contrast was deferred. EXAM: CT NECK WITHOUT CONTRAST CT MAXILLOFACIAL WITHOUT CONTRAST TECHNIQUE: Multidetector CT imaging of the neck, face is most was performed following the standard protocol without intravenous contrast. RADIATION DOSE REDUCTION: This exam was performed according to the departmental dose-optimization program which includes automated exposure control, adjustment of the mA and/or kV according to patient size and/or use of iterative reconstruction technique. COMPARISON:  Head CT today reported separately. FINDINGS: Pharynx and larynx: Inflammation throughout the right parapharyngeal space, least affecting the level of the nasopharynx. Asymmetry of the pharynx and supraglottic larynx with evidence of swollen right lateral pharyngeal wall. But no obvious tonsillar enlargement. Epiglottis remains within normal limits. Fluid and/or inflammation throughout the retropharyngeal space, most apparent from the C2 to the C4 cervical level as seen on sagittal image 46 of neck series 5. This is contiguous with both carotid spaces. The left parapharyngeal space is relatively normal. Salivary glands: Noncontrast sublingual space seems to remain within normal limits. Diffuse soft tissue swelling and inflammation throughout the right  submandibular space, right submandibular gland is largely obscured. And subcutaneous soft tissue inflammation tracks across midline to the left at the level of the hyoid and submental soft tissues, and the left submandibular space and gland also seems somewhat inflamed. Diffusely enlarged and inflamed right parotid gland. Widespread regional subcutaneous inflammation, thickening of the platysma, and inflammation in the regional right neck lymph node stations. Contralateral left parotid gland appears within normal limits. No sialolithiasis identified. No dilatation of the right parotid duct is evident. Thyroid: Heterogeneous.  No significant regional mass effect. Lymph nodes: Inflammation throughout the right neck lymph node stations and small but reactive appearing right level 1, level 2, and level 3 lymph nodes. Evaluation for cystic or necrotic node limited in the of the lack of IV contrast. Contralateral left cervical lymph node stations remain within normal limits. Vascular: Vascular patency is not evaluated in the absence of IV contrast. And there is a conspicuous appearance of the noncontrast right internal jugular vein in the lower neck on coronal image 54 of neck series 6. Superimposed bulky calcified right carotid bifurcation atherosclerosis. Limited intracranial: Head CT is reported separately. Visualized orbits: See Head CT reported separately. Mastoids and visualized paranasal sinuses: Best demonstrated on the face CT, the bilateral tympanic cavities and mastoids are clear. Paranasal sinuses are well aerated with of left sphenoid sinus 16 mm mucous retention cyst and otherwise only mild scattered paranasal sinus mucosal thickening. Nasal cavity demonstrates symmetric mucosal thickening. Skeleton: Best demonstrated on the CT face, much of the posterior  mandible and maxillary dentition is absent. The residual mostly anterior maxillary and mandible dentition is diffusely carious and poor. However,  periapical lucency more affects the maxilla than the mandible, and none of these seem to be the epicenter of inflammation described above. Mandible overall intact and aligned. Superimposed advanced cervical spine degeneration. No cervical endplate erosion is evident. No acute or suspicious osseous lesion identified. Upper chest: Calcified aortic atherosclerosis. No definite superior mediastinal inflammation. Lung apices are relatively clear with subtle centrilobular emphysema. Other findings: Right periauricular soft tissue inflammation in association with the right parotid gland findings. But the right pinna does not appear significantly swollen. The superficial portion of the right external auditory canal is inflamed and thickened, but the EAC remains patent. IMPRESSION: 1. Marked inflammatory process throughout the right neck, extending from the superficial subcutaneous tissues into the deep spaces including the right pharynx, parapharyngeal and retropharyngeal spaces. Etiology indeterminate on this noncontrast exam and differential considerations fairly broad, including Severe right Sialadenitis, complicated Pharyngitis, complicated Cellulitis, Retropharyngeal Infection, acute Right Internal Jugular Vein Thrombosis. Recommend a follow-up Neck CT with IV contrast when feasible. Failing that, a noncontrast Neck MRI might allow narrowing of the differential diagnosis. 2. Pertinent negatives include normal right middle ear and mastoids, no significant paranasal sinus inflammation, no obvious osteomyelitis, visible lung apices clear and poor dentition although seems not directly related to the right neck inflammation. 3. Salient findings were discussed by telephone with Erenest Rasher, PA on 01/26/2023 at 0440 hours Electronically Signed   By: Genevie Ann M.D.   On: 01/26/2023 04:58   CT Soft Tissue Neck Wo Contrast  Result Date: 01/26/2023 CLINICAL DATA:  82 year old female with delirium, redness and swelling  at the right upper jaw, neck soft tissue swelling and induration with pain. The patient has baseline renal insufficiency (baseline creatinine of about 3) and further elevated creatinine in now to 5. IV contrast was deferred. EXAM: CT NECK WITHOUT CONTRAST CT MAXILLOFACIAL WITHOUT CONTRAST TECHNIQUE: Multidetector CT imaging of the neck, face is most was performed following the standard protocol without intravenous contrast. RADIATION DOSE REDUCTION: This exam was performed according to the departmental dose-optimization program which includes automated exposure control, adjustment of the mA and/or kV according to patient size and/or use of iterative reconstruction technique. COMPARISON:  Head CT today reported separately. FINDINGS: Pharynx and larynx: Inflammation throughout the right parapharyngeal space, least affecting the level of the nasopharynx. Asymmetry of the pharynx and supraglottic larynx with evidence of swollen right lateral pharyngeal wall. But no obvious tonsillar enlargement. Epiglottis remains within normal limits. Fluid and/or inflammation throughout the retropharyngeal space, most apparent from the C2 to the C4 cervical level as seen on sagittal image 46 of neck series 5. This is contiguous with both carotid spaces. The left parapharyngeal space is relatively normal. Salivary glands: Noncontrast sublingual space seems to remain within normal limits. Diffuse soft tissue swelling and inflammation throughout the right submandibular space, right submandibular gland is largely obscured. And subcutaneous soft tissue inflammation tracks across midline to the left at the level of the hyoid and submental soft tissues, and the left submandibular space and gland also seems somewhat inflamed. Diffusely enlarged and inflamed right parotid gland. Widespread regional subcutaneous inflammation, thickening of the platysma, and inflammation in the regional right neck lymph node stations. Contralateral left parotid  gland appears within normal limits. No sialolithiasis identified. No dilatation of the right parotid duct is evident. Thyroid: Heterogeneous.  No significant regional mass effect. Lymph nodes: Inflammation throughout the  right neck lymph node stations and small but reactive appearing right level 1, level 2, and level 3 lymph nodes. Evaluation for cystic or necrotic node limited in the of the lack of IV contrast. Contralateral left cervical lymph node stations remain within normal limits. Vascular: Vascular patency is not evaluated in the absence of IV contrast. And there is a conspicuous appearance of the noncontrast right internal jugular vein in the lower neck on coronal image 54 of neck series 6. Superimposed bulky calcified right carotid bifurcation atherosclerosis. Limited intracranial: Head CT is reported separately. Visualized orbits: See Head CT reported separately. Mastoids and visualized paranasal sinuses: Best demonstrated on the face CT, the bilateral tympanic cavities and mastoids are clear. Paranasal sinuses are well aerated with of left sphenoid sinus 16 mm mucous retention cyst and otherwise only mild scattered paranasal sinus mucosal thickening. Nasal cavity demonstrates symmetric mucosal thickening. Skeleton: Best demonstrated on the CT face, much of the posterior mandible and maxillary dentition is absent. The residual mostly anterior maxillary and mandible dentition is diffusely carious and poor. However, periapical lucency more affects the maxilla than the mandible, and none of these seem to be the epicenter of inflammation described above. Mandible overall intact and aligned. Superimposed advanced cervical spine degeneration. No cervical endplate erosion is evident. No acute or suspicious osseous lesion identified. Upper chest: Calcified aortic atherosclerosis. No definite superior mediastinal inflammation. Lung apices are relatively clear with subtle centrilobular emphysema. Other findings:  Right periauricular soft tissue inflammation in association with the right parotid gland findings. But the right pinna does not appear significantly swollen. The superficial portion of the right external auditory canal is inflamed and thickened, but the EAC remains patent. IMPRESSION: 1. Marked inflammatory process throughout the right neck, extending from the superficial subcutaneous tissues into the deep spaces including the right pharynx, parapharyngeal and retropharyngeal spaces. Etiology indeterminate on this noncontrast exam and differential considerations fairly broad, including Severe right Sialadenitis, complicated Pharyngitis, complicated Cellulitis, Retropharyngeal Infection, acute Right Internal Jugular Vein Thrombosis. Recommend a follow-up Neck CT with IV contrast when feasible. Failing that, a noncontrast Neck MRI might allow narrowing of the differential diagnosis. 2. Pertinent negatives include normal right middle ear and mastoids, no significant paranasal sinus inflammation, no obvious osteomyelitis, visible lung apices clear and poor dentition although seems not directly related to the right neck inflammation. 3. Salient findings were discussed by telephone with Erenest Rasher, PA on 01/26/2023 at 0440 hours Electronically Signed   By: Genevie Ann M.D.   On: 01/26/2023 04:58   CT Head Wo Contrast  Result Date: 01/26/2023 CLINICAL DATA:  82 year old female with delirium, redness and swelling at the right upper jaw, neck soft tissue swelling and induration with pain. EXAM: CT HEAD WITHOUT CONTRAST TECHNIQUE: Contiguous axial images were obtained from the base of the skull through the vertex without intravenous contrast. RADIATION DOSE REDUCTION: This exam was performed according to the departmental dose-optimization program which includes automated exposure control, adjustment of the mA and/or kV according to patient size and/or use of iterative reconstruction technique. COMPARISON:  CT face  and neck today reported separately. Head CT 09/02/2007. FINDINGS: Brain: Cerebral volume loss since 2008 appears fairly generalized. No midline shift, ventriculomegaly, mass effect, evidence of mass lesion, intracranial hemorrhage or evidence of cortically based acute infarction. Mild to moderate patchy bilateral cerebral white matter hypodensity is new or progressed since 2008. And there is heterogeneity in the right thalamus compatible with a small age indeterminate lacunar infarct there. No cortical encephalomalacia  identified. Vascular: Calcified atherosclerosis at the skull base. No suspicious intracranial vascular hyperdensity. Skull: Osteopenia. Motion artifact at the skull base. No acute osseous abnormality identified. Sinuses/Orbits: Paranasal sinuses are well aerated. There is a left sphenoid mucous retention cysts. Tympanic cavities and mastoids appear clear. Other: Calcified scalp vessel atherosclerosis. Partially visible widespread subcutaneous inflammation in the region of the right ear, tracking inferiorly. See comparison neck and face CTs. Orbits soft tissues appear negative. IMPRESSION: 1. Progressed small vessel disease since 2008, including in the right thalamus which is age indeterminate. 2. No other acute intracranial abnormality identified. 3. Partially visible widespread superficial inflammation surrounding the right ear. But right middle ear and mastoids remain clear. See comparison Neck and Face CT reported separately. Electronically Signed   By: Genevie Ann M.D.   On: 01/26/2023 04:35   DG Chest Port 1 View  Result Date: 01/26/2023 CLINICAL DATA:  Questionable sepsis EXAM: PORTABLE CHEST 1 VIEW COMPARISON:  12/11/2008 FINDINGS: Heart and mediastinal contours are within normal limits. No focal opacities or effusions. No acute bony abnormality. Aortic atherosclerosis. IMPRESSION: No active disease. Electronically Signed   By: Rolm Baptise M.D.   On: 01/26/2023 02:51     Medical Consultants:    None.   Subjective:    Erica Whitney no complains  Objective:    Vitals:   01/26/23 1200 01/26/23 1438 01/26/23 2018 01/27/23 0532  BP: (!) 123/56 105/86 130/67 (!) 149/78  Pulse: 81 81 83 87  Resp: 19  17 17  $ Temp:  99.7 F (37.6 C) 98.6 F (37 C) 98.3 F (36.8 C)  TempSrc:   Oral Oral  SpO2: 96% 94% 100% 100%  Weight:      Height:       SpO2: 100 % O2 Flow Rate (L/min): 2 L/min   Intake/Output Summary (Last 24 hours) at 01/27/2023 0700 Last data filed at 01/26/2023 2346 Gross per 24 hour  Intake 1040.04 ml  Output 500 ml  Net 540.04 ml   Filed Weights   01/26/23 0222  Weight: 71 kg    Exam: General exam: In no acute distress. Respiratory system: Good air movement and clear to auscultation. Cardiovascular system: S1 & S2 heard, RRR. No JVD. Gastrointestinal system: Abdomen is nondistended, soft and nontender.  Extremities: No pedal edema. Skin: No rashes, lesions or ulcers Psychiatry: Judgement and insight appear normal. Mood & affect appropriate.    Data Reviewed:    Labs: Basic Metabolic Panel: Recent Labs  Lab 01/26/23 0250 01/26/23 0853 01/27/23 0326  NA 139  --  138  K 3.4*  --  3.1*  CL 101  --  102  CO2 22  --  23  GLUCOSE 131*  --  122*  BUN 124*  --  109*  CREATININE 4.91* 4.60* 4.44*  CALCIUM 9.4  --  8.9   GFR Estimated Creatinine Clearance: 9.5 mL/min (A) (by C-G formula based on SCr of 4.44 mg/dL (H)). Liver Function Tests: Recent Labs  Lab 01/26/23 0250  AST 11*  ALT 8  ALKPHOS 45  BILITOT 0.8  PROT 6.7  ALBUMIN 3.1*   No results for input(s): "LIPASE", "AMYLASE" in the last 168 hours. No results for input(s): "AMMONIA" in the last 168 hours. Coagulation profile Recent Labs  Lab 01/26/23 0250  INR 1.2   COVID-19 Labs  Recent Labs    01/24/23 0803  FERRITIN 140    Lab Results  Component Value Date   SARSCOV2NAA NEGATIVE 05/25/2021   [place] NEGATIVE 05/13/2021  CBC: Recent Labs  Lab  01/24/23 0807 01/26/23 0250 01/26/23 0853 01/27/23 0326  WBC  --  13.0* 14.1* 14.0*  NEUTROABS  --  10.2*  --   --   HGB 10.5* 10.3* 10.2* 10.1*  HCT  --  32.3* 32.7* 31.0*  MCV  --  101.3* 102.2* 99.7  PLT  --  171 175 194   Cardiac Enzymes: No results for input(s): "CKTOTAL", "CKMB", "CKMBINDEX", "TROPONINI" in the last 168 hours. BNP (last 3 results) No results for input(s): "PROBNP" in the last 8760 hours. CBG: No results for input(s): "GLUCAP" in the last 168 hours. D-Dimer: No results for input(s): "DDIMER" in the last 72 hours. Hgb A1c: Recent Labs    01/26/23 0250  HGBA1C 4.4*   Lipid Profile: No results for input(s): "CHOL", "HDL", "LDLCALC", "TRIG", "CHOLHDL", "LDLDIRECT" in the last 72 hours. Thyroid function studies: No results for input(s): "TSH", "T4TOTAL", "T3FREE", "THYROIDAB" in the last 72 hours.  Invalid input(s): "FREET3" Anemia work up: Recent Labs    01/24/23 0803  FERRITIN 140  TIBC 196*  IRON 47   Sepsis Labs: Recent Labs  Lab 01/26/23 0250 01/26/23 0556 01/26/23 0853 01/27/23 0326  WBC 13.0*  --  14.1* 14.0*  LATICACIDVEN 0.8 0.7  --   --    Microbiology Recent Results (from the past 240 hour(s))  Blood Culture (routine x 2)     Status: None (Preliminary result)   Collection Time: 01/26/23  3:00 AM   Specimen: BLOOD  Result Value Ref Range Status   Specimen Description BLOOD LEFT ANTECUBITAL  Final   Special Requests   Final    BOTTLES DRAWN AEROBIC AND ANAEROBIC Blood Culture adequate volume   Culture   Final    NO GROWTH < 12 HOURS Performed at [facility], [address] [place], [place] [address]    Report Status PENDING  Incomplete  Blood Culture (routine x 2)     Status: None (Preliminary result)   Collection Time: 01/26/23  3:00 AM   Specimen: BLOOD LEFT WRIST  Result Value Ref Range Status   Specimen Description BLOOD LEFT WRIST  Final   Special Requests   Final    BOTTLES DRAWN AEROBIC AND ANAEROBIC Blood  Culture results may not be optimal due to an inadequate volume of blood received in culture bottles   Culture   Final    NO GROWTH < 12 HOURS Performed at [facility], [place] [address], [place], [place] [address]    Report Status PENDING  Incomplete     Medications:    amLODipine  10 mg Oral Daily   ascorbic acid  500 mg Oral Daily   atorvastatin  40 mg Oral Daily   calcium-vitamin D  1 tablet Oral Q breakfast   cholecalciferol  1,000 Units Oral Daily   enoxaparin (LOVENOX) injection  30 mg Subcutaneous Daily   ferrous sulfate  325 mg Oral Q breakfast   hydrALAZINE  50 mg Oral TID   mirtazapine  30 mg Oral QHS   pantoprazole  40 mg Oral Daily   sertraline  100 mg Oral Daily   Continuous Infusions:  clindamycin (CLEOCIN) IV 600 mg (01/27/23 [id number])   lactated ringers 75 mL/hr at 01/27/23 0534      LOS: 1 day   Charlynne Cousins  [facility]  01/27/2023, 7:00 AM

## 2023-01-27 NOTE — Plan of Care (Signed)
  Problem: Health Behavior/Discharge Planning: Goal: Ability to manage health-related needs will improve Outcome: Progressing   Problem: Activity: Goal: Risk for activity intolerance will decrease Outcome: Progressing   Problem: Coping: Goal: Level of anxiety will decrease Outcome: Progressing   

## 2023-01-27 NOTE — Progress Notes (Signed)
Speech Language Pathology Treatment: Dysphagia  Patient Details Name: Erica Whitney MRN: [id number] DOB: 09/11/1941 Today's Date: 01/27/2023 Time: [id number] SLP Time Calculation (min) (ACUTE ONLY): 15 min  Assessment / Plan / Recommendation Clinical Impression  Patient seen by SLP for skilled treatment focused on dysphagia goals. Patient's daughter in room and reporting that she seems to be improving with inflammation and that her speech/voice is significantly improved with daughter reporting that previous date her voice sounded hoarse and congested. Currently, patient sitting in recliner and had just finished getting washed up with daughter's assistance. Her voice is clear, mildly low in intensity but nothing significant and speech is clear. She has some limited movement of right facial and bilabial musculature, leading to some collection of saliva. She tolerated cup sips of thin liquids (water) with timely swallow initiation and only delays coming from instances of inattention leading to brief holding of liquids in mouth. She then self-fed applesauce with no significant oral or pharyngeal phase deficits observed and no grimacing or c/o discomfort. Daughter commented that she noticed the difference between today and yesterday because today "she keeps going back for it" (ate entire cup of applesauce). SLP recommending to continue with full liquids but allow the puree/pudding consistency PO's and oral meds crushed in puree. SLP will f/u with likely upgrade in next 1-2 days to soft solids.   HPI HPI: Erica Whitney is a 82 y.o. female who was brought in by daughter after noticing right mandibular swelling and redness.  CT Neck: "Inflammation throughout the right parapharyngeal space, least affecting the level of the nasopharynx. Asymmetry of the pharynx and supraglottic larynx with evidence of swollen right lateral pharyngeal wall. But no obvious tonsillar enlargement.  Epiglottis remains within  normal limits. Fluid and/or inflammation throughout the retropharyngeal space. ... The left pharyngeal space is relatively normal."  Per ENT: "Right neck and face swelling- this could have various sources but most likely this is sialadenitis of the parotid." CXR 2/9 with no active disease. Pt with history of breast cancer, CKD, anemia of chronic disease, peptic ulcer disease, borderline type 2 diabetes.      SLP Plan  Continue with current plan of care      Recommendations for follow up therapy are one component of a multi-disciplinary discharge planning process, led by the attending physician.  Recommendations may be updated based on patient status, additional functional criteria and insurance authorization.    Recommendations  Diet recommendations: Thin liquid;Other(comment) (full liquids) Liquids provided via: Cup;Straw Medication Administration: Crushed with puree Supervision: Patient able to self feed Compensations: Slow rate;Small sips/bites Postural Changes and/or Swallow Maneuvers: Seated upright 90 degrees                Oral Care Recommendations: Oral care BID Follow Up Recommendations: No SLP follow up Assistance recommended at discharge: None SLP Visit Diagnosis: Dysphagia, unspecified (R13.10) Plan: Continue with current plan of care           Sonia Baller, MA, CCC-SLP Speech Therapy

## 2023-01-27 NOTE — Progress Notes (Signed)
Speech Language Pathology Treatment: Dysphagia  Patient Details Name: KARMANN BLAUSTEIN MRN: [id number] DOB: [birth year] Today's Date: 01/27/2023 Time: 1300-1310 SLP Time Calculation (min) (ACUTE ONLY): 10 min  Assessment / Plan / Recommendation Clinical Impression  Patient seen again in PM at request of daughter to determine if diet consistency could be upgraded. When SLP arrived, patient in bed and drowsy and so session focused on discussion and education with daughter. She reported that patient ate her entire lunch meal (full liquids) without any observed difficulties. Patient asked daughter about getting "some meat". SLP described Dys 1 (puree) and Dys 2 (fine chop/minced) diets to daughter and SLP and daughter in agreement to try Dys 2 with option to change to Dys 1 if patient having difficulty. SLP plans to f/u next date schedule permitting, to ensure patient tolerating advanced solids.    HPI HPI: CHRISLYN GALLATIN is a 82 y.o. female who was brought in by daughter after noticing right mandibular swelling and redness.  CT Neck: "Inflammation throughout the right parapharyngeal space, least affecting the level of the nasopharynx. Asymmetry of the pharynx and supraglottic larynx with evidence of swollen right lateral pharyngeal wall. But no obvious tonsillar enlargement.  Epiglottis remains within normal limits. Fluid and/or inflammation throughout the retropharyngeal space. ... The left pharyngeal space is relatively normal."  Per ENT: "Right neck and face swelling- this could have various sources but most likely this is sialadenitis of the parotid." CXR 2/9 with no active disease. Pt with history of breast cancer, CKD, anemia of chronic disease, peptic ulcer disease, borderline type 2 diabetes.      SLP Plan  Continue with current plan of care      Recommendations for follow up therapy are one component of a multi-disciplinary discharge planning process, led by the attending physician.   Recommendations may be updated based on patient status, additional functional criteria and insurance authorization.    Recommendations  Diet recommendations: Dysphagia 2 (fine chop);Thin liquid Liquids provided via: Cup;Straw Medication Administration: Crushed with puree Supervision: Patient able to self feed Compensations: Slow rate;Small sips/bites Postural Changes and/or Swallow Maneuvers: Seated upright 90 degrees                Oral Care Recommendations: Oral care BID Follow Up Recommendations: No SLP follow up SLP Visit Diagnosis: Dysphagia, unspecified (R13.10) Plan: Continue with current plan of care           Sonia Baller, MA, CCC-SLP Speech Therapy

## 2023-01-27 NOTE — Progress Notes (Signed)
[facility] Progress Note   Subjective:   feeling better - taking some po intake now. Ambulating, using bathroom.  Daughter remains bedside.  Pleased with progress.  No LUTs, no edema or orthonpea.   Objective Vitals:   01/26/23 1438 01/26/23 2018 01/27/23 0532 01/27/23 0726  BP: 105/86 130/67 (!) 149/78 126/61  Pulse: 81 83 87 90  Resp:  17 17 16  $ Temp: 99.7 F (37.6 C) 98.6 F (37 C) 98.3 F (36.8 C) 98.9 F (37.2 C)  TempSrc:  Oral Oral Oral  SpO2: 94% 100% 100% 96%  Weight:      Height:       Physical Exam PHYSICAL EXAM: Gen: elderly woman lying calmly in bed, awake and alert Eyes: anicteric  ENT: MMM, R mandibular swelling and TTP - improved c/w yesterday, poor dentition Neck: no swelling or JVD CV: RRR no rub Abd: soft Lungs: clear GU: no foley Extr: no edema Neuro: awake and alert - much improved  Additional Objective Labs: Basic Metabolic Panel: Recent Labs  Lab 01/26/23 0250 01/26/23 0853 01/27/23 0326  NA 139  --  138  K 3.4*  --  3.1*  CL 101  --  102  CO2 22  --  23  GLUCOSE 131*  --  122*  BUN 124*  --  109*  CREATININE 4.91* 4.60* 4.44*  CALCIUM 9.4  --  8.9   Liver Function Tests: Recent Labs  Lab 01/26/23 0250  AST 11*  ALT 8  ALKPHOS 45  BILITOT 0.8  PROT 6.7  ALBUMIN 3.1*   No results for input(s): "LIPASE", "AMYLASE" in the last 168 hours. CBC: Recent Labs  Lab 01/26/23 0250 01/26/23 0853 01/27/23 0326  WBC 13.0* 14.1* 14.0*  NEUTROABS 10.2*  --   --   HGB 10.3* 10.2* 10.1*  HCT 32.3* 32.7* 31.0*  MCV 101.3* 102.2* 99.7  PLT 171 175 194   Blood Culture    Component Value Date/Time   SDES BLOOD LEFT ANTECUBITAL 01/26/2023 0300   SDES BLOOD LEFT WRIST 01/26/2023 0300   SPECREQUEST  01/26/2023 0300    BOTTLES DRAWN AEROBIC AND ANAEROBIC Blood Culture adequate volume   SPECREQUEST  01/26/2023 0300    BOTTLES DRAWN AEROBIC AND ANAEROBIC Blood Culture results may not be optimal due to an inadequate  volume of blood received in culture bottles   CULT  01/26/2023 0300    NO GROWTH 1 DAY Performed at [facility] [address] [place], [place] [address]    CULT  01/26/2023 0300    NO GROWTH 1 DAY Performed at [facility] [address], [place], [place] [address]    REPTSTATUS PENDING 01/26/2023 0300   REPTSTATUS PENDING 01/26/2023 0300    Cardiac Enzymes: No results for input(s): "CKTOTAL", "CKMB", "CKMBINDEX", "TROPONINI" in the last 168 hours. CBG: No results for input(s): "GLUCAP" in the last 168 hours. Iron Studies: No results for input(s): "IRON", "TIBC", "TRANSFERRIN", "FERRITIN" in the last 72 hours. @lablastinr3$ @ Studies/Results: US RENAL  Result Date: 01/26/2023 CLINICAL DATA:  Acute kidney injury. EXAM: RENAL / URINARY TRACT ULTRASOUND COMPLETE COMPARISON:  Renal ultrasound 04/19/2021 FINDINGS: Right Kidney: Renal measurements: 7.4 x 3.4 x 4.6 cm = volume: 61 mL. Echogenicity within normal limits. No mass or hydronephrosis visualized. Left Kidney: Renal measurements: 9.6 x 4.0 x 6.3 cm = volume: 127 mL. Increased parenchymal echogenicity. No solid mass or hydronephrosis. Unchanged 3.2 cm simple appearing cyst for which no follow-up imaging is recommended. Bladder:  Appears normal for degree of bladder distention. Other: None. IMPRESSION: 1. No hydronephrosis. 2. Chronic, asymmetrically severe right renal atrophy. Electronically Signed   By: Logan Bores M.D.   On: 01/26/2023 10:34   CT Maxillofacial Wo Contrast  Result Date: 01/26/2023 CLINICAL DATA:  82 year old female with delirium, redness and swelling at the right upper jaw, neck soft tissue swelling and induration with pain. The patient has baseline renal insufficiency (baseline creatinine of about 3) and further elevated creatinine in now to 5. IV contrast was deferred. EXAM: CT NECK WITHOUT CONTRAST CT MAXILLOFACIAL WITHOUT CONTRAST TECHNIQUE: Multidetector CT imaging of the neck, face is most was performed  following the standard protocol without intravenous contrast. RADIATION DOSE REDUCTION: This exam was performed according to the departmental dose-optimization program which includes automated exposure control, adjustment of the mA and/or kV according to patient size and/or use of iterative reconstruction technique. COMPARISON:  Head CT today reported separately. FINDINGS: Pharynx and larynx: Inflammation throughout the right parapharyngeal space, least affecting the level of the nasopharynx. Asymmetry of the pharynx and supraglottic larynx with evidence of swollen right lateral pharyngeal wall. But no obvious tonsillar enlargement. Epiglottis remains within normal limits. Fluid and/or inflammation throughout the retropharyngeal space, most apparent from the C2 to the C4 cervical level as seen on sagittal image 46 of neck series 5. This is contiguous with both carotid spaces. The left parapharyngeal space is relatively normal. Salivary glands: Noncontrast sublingual space seems to remain within normal limits. Diffuse soft tissue swelling and inflammation throughout the right submandibular space, right submandibular gland is largely obscured. And subcutaneous soft tissue inflammation tracks across midline to the left at the level of the hyoid and submental soft tissues, and the left submandibular space and gland also seems somewhat inflamed. Diffusely enlarged and inflamed right parotid gland. Widespread regional subcutaneous inflammation, thickening of the platysma, and inflammation in the regional right neck lymph node stations. Contralateral left parotid gland appears within normal limits. No sialolithiasis identified. No dilatation of the right parotid duct is evident. Thyroid: Heterogeneous.  No significant regional mass effect. Lymph nodes: Inflammation throughout the right neck lymph node stations and small but reactive appearing right level 1, level 2, and level 3 lymph nodes. Evaluation for cystic or  necrotic node limited in the of the lack of IV contrast. Contralateral left cervical lymph node stations remain within normal limits. Vascular: Vascular patency is not evaluated in the absence of IV contrast. And there is a conspicuous appearance of the noncontrast right internal jugular vein in the lower neck on coronal image 54 of neck series 6. Superimposed bulky calcified right carotid bifurcation atherosclerosis. Limited intracranial: Head CT is reported separately. Visualized orbits: See Head CT reported separately. Mastoids and visualized paranasal sinuses: Best demonstrated on the face CT, the bilateral tympanic cavities and mastoids are clear. Paranasal sinuses are well aerated with of left sphenoid sinus 16 mm mucous retention cyst and otherwise only mild scattered paranasal sinus mucosal thickening. Nasal cavity demonstrates symmetric mucosal thickening. Skeleton: Best demonstrated on the CT face, much of the posterior mandible and maxillary dentition is absent. The residual mostly anterior maxillary and mandible dentition is diffusely carious and poor. However, periapical lucency more affects the maxilla than the mandible, and none of these seem to be the epicenter of inflammation described above. Mandible overall intact and aligned. Superimposed advanced cervical spine degeneration. No cervical endplate erosion is evident. No acute or suspicious osseous lesion identified. Upper chest: Calcified aortic atherosclerosis. No definite superior mediastinal inflammation.  Lung apices are relatively clear with subtle centrilobular emphysema. Other findings: Right periauricular soft tissue inflammation in association with the right parotid gland findings. But the right pinna does not appear significantly swollen. The superficial portion of the right external auditory canal is inflamed and thickened, but the EAC remains patent. IMPRESSION: 1. Marked inflammatory process throughout the right neck, extending from  the superficial subcutaneous tissues into the deep spaces including the right pharynx, parapharyngeal and retropharyngeal spaces. Etiology indeterminate on this noncontrast exam and differential considerations fairly broad, including Severe right Sialadenitis, complicated Pharyngitis, complicated Cellulitis, Retropharyngeal Infection, acute Right Internal Jugular Vein Thrombosis. Recommend a follow-up Neck CT with IV contrast when feasible. Failing that, a noncontrast Neck MRI might allow narrowing of the differential diagnosis. 2. Pertinent negatives include normal right middle ear and mastoids, no significant paranasal sinus inflammation, no obvious osteomyelitis, visible lung apices clear and poor dentition although seems not directly related to the right neck inflammation. 3. Salient findings were discussed by telephone with Erenest Rasher, PA on 01/26/2023 at 0440 hours Electronically Signed   By: Genevie Ann M.D.   On: 01/26/2023 04:58   CT Soft Tissue Neck Wo Contrast  Result Date: 01/26/2023 CLINICAL DATA:  82 year old female with delirium, redness and swelling at the right upper jaw, neck soft tissue swelling and induration with pain. The patient has baseline renal insufficiency (baseline creatinine of about 3) and further elevated creatinine in now to 5. IV contrast was deferred. EXAM: CT NECK WITHOUT CONTRAST CT MAXILLOFACIAL WITHOUT CONTRAST TECHNIQUE: Multidetector CT imaging of the neck, face is most was performed following the standard protocol without intravenous contrast. RADIATION DOSE REDUCTION: This exam was performed according to the departmental dose-optimization program which includes automated exposure control, adjustment of the mA and/or kV according to patient size and/or use of iterative reconstruction technique. COMPARISON:  Head CT today reported separately. FINDINGS: Pharynx and larynx: Inflammation throughout the right parapharyngeal space, least affecting the level of the  nasopharynx. Asymmetry of the pharynx and supraglottic larynx with evidence of swollen right lateral pharyngeal wall. But no obvious tonsillar enlargement. Epiglottis remains within normal limits. Fluid and/or inflammation throughout the retropharyngeal space, most apparent from the C2 to the C4 cervical level as seen on sagittal image 46 of neck series 5. This is contiguous with both carotid spaces. The left parapharyngeal space is relatively normal. Salivary glands: Noncontrast sublingual space seems to remain within normal limits. Diffuse soft tissue swelling and inflammation throughout the right submandibular space, right submandibular gland is largely obscured. And subcutaneous soft tissue inflammation tracks across midline to the left at the level of the hyoid and submental soft tissues, and the left submandibular space and gland also seems somewhat inflamed. Diffusely enlarged and inflamed right parotid gland. Widespread regional subcutaneous inflammation, thickening of the platysma, and inflammation in the regional right neck lymph node stations. Contralateral left parotid gland appears within normal limits. No sialolithiasis identified. No dilatation of the right parotid duct is evident. Thyroid: Heterogeneous.  No significant regional mass effect. Lymph nodes: Inflammation throughout the right neck lymph node stations and small but reactive appearing right level 1, level 2, and level 3 lymph nodes. Evaluation for cystic or necrotic node limited in the of the lack of IV contrast. Contralateral left cervical lymph node stations remain within normal limits. Vascular: Vascular patency is not evaluated in the absence of IV contrast. And there is a conspicuous appearance of the noncontrast right internal jugular vein in the lower neck on coronal  image 54 of neck series 6. Superimposed bulky calcified right carotid bifurcation atherosclerosis. Limited intracranial: Head CT is reported separately. Visualized  orbits: See Head CT reported separately. Mastoids and visualized paranasal sinuses: Best demonstrated on the face CT, the bilateral tympanic cavities and mastoids are clear. Paranasal sinuses are well aerated with of left sphenoid sinus 16 mm mucous retention cyst and otherwise only mild scattered paranasal sinus mucosal thickening. Nasal cavity demonstrates symmetric mucosal thickening. Skeleton: Best demonstrated on the CT face, much of the posterior mandible and maxillary dentition is absent. The residual mostly anterior maxillary and mandible dentition is diffusely carious and poor. However, periapical lucency more affects the maxilla than the mandible, and none of these seem to be the epicenter of inflammation described above. Mandible overall intact and aligned. Superimposed advanced cervical spine degeneration. No cervical endplate erosion is evident. No acute or suspicious osseous lesion identified. Upper chest: Calcified aortic atherosclerosis. No definite superior mediastinal inflammation. Lung apices are relatively clear with subtle centrilobular emphysema. Other findings: Right periauricular soft tissue inflammation in association with the right parotid gland findings. But the right pinna does not appear significantly swollen. The superficial portion of the right external auditory canal is inflamed and thickened, but the EAC remains patent. IMPRESSION: 1. Marked inflammatory process throughout the right neck, extending from the superficial subcutaneous tissues into the deep spaces including the right pharynx, parapharyngeal and retropharyngeal spaces. Etiology indeterminate on this noncontrast exam and differential considerations fairly broad, including Severe right Sialadenitis, complicated Pharyngitis, complicated Cellulitis, Retropharyngeal Infection, acute Right Internal Jugular Vein Thrombosis. Recommend a follow-up Neck CT with IV contrast when feasible. Failing that, a noncontrast Neck MRI might  allow narrowing of the differential diagnosis. 2. Pertinent negatives include normal right middle ear and mastoids, no significant paranasal sinus inflammation, no obvious osteomyelitis, visible lung apices clear and poor dentition although seems not directly related to the right neck inflammation. 3. Salient findings were discussed by telephone with Erenest Rasher, PA on 01/26/2023 at 0440 hours Electronically Signed   By: Genevie Ann M.D.   On: 01/26/2023 04:58   CT Head Wo Contrast  Result Date: 01/26/2023 CLINICAL DATA:  82 year old female with delirium, redness and swelling at the right upper jaw, neck soft tissue swelling and induration with pain. EXAM: CT HEAD WITHOUT CONTRAST TECHNIQUE: Contiguous axial images were obtained from the base of the skull through the vertex without intravenous contrast. RADIATION DOSE REDUCTION: This exam was performed according to the departmental dose-optimization program which includes automated exposure control, adjustment of the mA and/or kV according to patient size and/or use of iterative reconstruction technique. COMPARISON:  CT face and neck today reported separately. Head CT 09/02/2007. FINDINGS: Brain: Cerebral volume loss since 2008 appears fairly generalized. No midline shift, ventriculomegaly, mass effect, evidence of mass lesion, intracranial hemorrhage or evidence of cortically based acute infarction. Mild to moderate patchy bilateral cerebral white matter hypodensity is new or progressed since 2008. And there is heterogeneity in the right thalamus compatible with a small age indeterminate lacunar infarct there. No cortical encephalomalacia identified. Vascular: Calcified atherosclerosis at the skull base. No suspicious intracranial vascular hyperdensity. Skull: Osteopenia. Motion artifact at the skull base. No acute osseous abnormality identified. Sinuses/Orbits: Paranasal sinuses are well aerated. There is a left sphenoid mucous retention cysts. Tympanic  cavities and mastoids appear clear. Other: Calcified scalp vessel atherosclerosis. Partially visible widespread subcutaneous inflammation in the region of the right ear, tracking inferiorly. See comparison neck and face CTs. Orbits soft tissues appear  negative. IMPRESSION: 1. Progressed small vessel disease since 2008, including in the right thalamus which is age indeterminate. 2. No other acute intracranial abnormality identified. 3. Partially visible widespread superficial inflammation surrounding the right ear. But right middle ear and mastoids remain clear. See comparison Neck and Face CT reported separately. Electronically Signed   By: Genevie Ann M.D.   On: 01/26/2023 04:35   DG Chest Port 1 View  Result Date: 01/26/2023 CLINICAL DATA:  Questionable sepsis EXAM: PORTABLE CHEST 1 VIEW COMPARISON:  12/11/2008 FINDINGS: Heart and mediastinal contours are within normal limits. No focal opacities or effusions. No acute bony abnormality. Aortic atherosclerosis. IMPRESSION: No active disease. Electronically Signed   By: Rolm Baptise M.D.   On: 01/26/2023 02:51   Medications:  clindamycin (CLEOCIN) IV 600 mg (01/27/23 1250)    amLODipine  10 mg Oral Daily   ascorbic acid  500 mg Oral Daily   atorvastatin  40 mg Oral Daily   calcium-vitamin D  1 tablet Oral Q breakfast   cholecalciferol  1,000 Units Oral Daily   enoxaparin (LOVENOX) injection  30 mg Subcutaneous Daily   ferrous sulfate  325 mg Oral Q breakfast   hydrALAZINE  50 mg Oral TID   mirtazapine  30 mg Oral QHS   pantoprazole  40 mg Oral Daily   sertraline  100 mg Oral Daily    Assessment/Plan **mandibular swelling:  Imaging inconclusive.  ENT c/s - likely sialadenitis, cellulitis.  Being covered with appropriate antibiotics, heat therapy.   improving   **AKI on CKD 4: Baseline Cr low 3s.  Now presenting with BUN 124, Cr 4.9 in setting of poor po intake related to mandibular swelling. Renal US no obstruction.  UA bland except mild protein  (not a new finding). Electrolytes/bicarb acceptable.  DIscussed with daughter -- she would never accept dialysis per prior conversation.   Improving to Cr 4.4 today, BUN 109. Cont IV hydration and supportive care, following serial labs.  Strict I/Os.  Avoid nephrotoxins, avoid hypotension.  I think she's likely to continue to  turn around.   **hypokalemia: repleted, recheck in AM   **Possible RIJ DVT: agree with avoiding iodinated contrast, now ruled out with venous doppler   **Anemia:  receives ESA q2wks outpt and IV iron PRN.  Hb 10s, stable.    **HTN: high in setting of missed meds --now improved after taking home meds   **DM: per primary   Will follow, please reach out with concerns.    Jannifer Hick MD 01/27/2023, 3:27 PM  [facility] Pager: [phone]

## 2023-01-27 NOTE — Evaluation (Signed)
Physical Therapy Evaluation Patient Details Name: Erica Whitney MRN: [id number] DOB: Jan 27, 1941 Today's Date: 01/27/2023  History of Present Illness  Pt is a 82 y.o. F who presents 01/26/2023 with right facial cellulitis and acute on chronic kidney disease stage IV. Significant PMH: breast cancer, chronic NEC stage IIIa, anemia of chronic disease, history of peptic ulcer borderline diabetes mellitus  Clinical Impression  Pt admitted with above. PTA, pt states she lives with her daughter and is independent using a RW. Does endorse 2 recent falls, however, unable to provide further details. Pt overall is mobilizing fairly. Ambulating 200 ft with a walker at a min assist level. Presents as a a high fall risk based on history of falls, decreased gait speed and safety awareness. Recommend follow up HHPT to address.     Recommendations for follow up therapy are one component of a multi-disciplinary discharge planning process, led by the attending physician.  Recommendations may be updated based on patient status, additional functional criteria and insurance authorization.  Follow Up Recommendations Home health PT      Assistance Recommended at Discharge PRN  Patient can return home with the following  A little help with walking and/or transfers;A little help with bathing/dressing/bathroom;Assistance with cooking/housework;Assist for transportation;Help with stairs or ramp for entrance    Equipment Recommendations None recommended by PT  Recommendations for Other Services       Functional Status Assessment Patient has had a recent decline in their functional status and demonstrates the ability to make significant improvements in function in a reasonable and predictable amount of time.     Precautions / Restrictions Precautions Precautions: Fall Restrictions Weight Bearing Restrictions: No      Mobility  Bed Mobility Overal bed mobility: Needs Assistance Bed Mobility: Supine to  Sit     Supine to sit: Min assist     General bed mobility comments: Assist for initiation due to grogginess    Transfers Overall transfer level: Needs assistance Equipment used: Rolling walker (2 wheels) Transfers: Sit to/from Stand Sit to Stand: Min guard                Ambulation/Gait Ambulation/Gait assistance: Herbalist (Feet): 200 Feet Assistive device: Rolling walker (2 wheels) Gait Pattern/deviations: Step-through pattern, Decreased stride length, Shuffle, Drifts right/left Gait velocity: decreased Gait velocity interpretation: <1.8 ft/sec, indicate of risk for recurrent falls   General Gait Details: Decreased bilateral foot clearance, tendency to drift towards right, minA for steering walker  Stairs            Wheelchair Mobility    Modified Rankin (Stroke Patients Only)       Balance Overall balance assessment: Mild deficits observed, not formally tested                                           Pertinent Vitals/Pain Pain Assessment Pain Assessment: No/denies pain    Home Living Family/patient expects to be discharged to:: Private residence Living Arrangements: Children (daughter) Available Help at Discharge: Family;Available PRN/intermittently Type of Home: House Home Access: Level entry       Home Layout: One level Home Equipment: Conservation officer, nature (2 wheels);Shower seat Additional Comments: Pt questionable historian    Prior Function Prior Level of Function : Independent/Modified Independent             Mobility Comments: using RW  Hand Dominance        Extremity/Trunk Assessment   Upper Extremity Assessment Upper Extremity Assessment: Overall WFL for tasks assessed    Lower Extremity Assessment Lower Extremity Assessment: Overall WFL for tasks assessed    Cervical / Trunk Assessment Cervical / Trunk Assessment: Kyphotic  Communication   Communication: HOH  Cognition  Arousal/Alertness: Awake/alert Behavior During Therapy: WFL for tasks assessed/performed Overall Cognitive Status: No family/caregiver present to determine baseline cognitive functioning                                 General Comments: Pt not oriented to year, stating it was 1974. Initially groggy; when asked if she needed to go to the bathroom, pt stating, "that's out of my budget." Verbal cues for problem solving, questionable historian        General Comments      Exercises     Assessment/Plan    PT Assessment Patient needs continued PT services  PT Problem List Decreased strength;Decreased balance;Decreased activity tolerance;Decreased mobility;Decreased cognition;Decreased safety awareness       PT Treatment Interventions DME instruction;Gait training;Therapeutic activities;Functional mobility training;Therapeutic exercise;Balance training;Cognitive remediation    PT Goals (Current goals can be found in the Care Plan section)  Acute Rehab PT Goals Patient Stated Goal: did not state PT Goal Formulation: With patient Time For Goal Achievement: 02/10/23 Potential to Achieve Goals: Good    Frequency Min 3X/week     Co-evaluation               AM-PAC PT "6 Clicks" Mobility  Outcome Measure Help needed turning from your back to your side while in a flat bed without using bedrails?: A Little Help needed moving from lying on your back to sitting on the side of a flat bed without using bedrails?: A Little Help needed moving to and from a bed to a chair (including a wheelchair)?: A Little Help needed standing up from a chair using your arms (e.g., wheelchair or bedside chair)?: A Little Help needed to walk in hospital room?: A Little Help needed climbing 3-5 steps with a railing? : A Little 6 Click Score: 18    End of Session Equipment Utilized During Treatment: Gait belt Activity Tolerance: Patient tolerated treatment well Patient left: in chair;with  call bell/phone within reach;with chair alarm set Nurse Communication: Mobility status PT Visit Diagnosis: Unsteadiness on feet (R26.81);Difficulty in walking, not elsewhere classified (R26.2)    Time: [id number] PT Time Calculation (min) (ACUTE ONLY): 30 min   Charges:   PT Evaluation $PT Eval Low Complexity: 1 Low PT Treatments $Therapeutic Activity: 8-22 mins        Wyona Almas, PT, DPT Acute Rehabilitation Services Office [phone]   Deno Etienne 01/27/2023, 9:30 AM

## 2023-01-27 NOTE — Progress Notes (Signed)
Subjective: Follow-up sialoadenitis.  She is sitting in her bed upright eating breakfast.  She is feeling much better.  Her family member is in the room and is very pleased with how much better she is doing.  Objective: Vital signs in last 24 hours: Temp:  [98.3 F (36.8 C)-99.7 F (37.6 C)] 98.9 F (37.2 C) (02/10 0726) Pulse Rate:  [79-90] 90 (02/10 0726) Resp:  [14-31] 16 (02/10 0726) BP: (105-149)/(56-86) 126/61 (02/10 0726) SpO2:  [94 %-100 %] 96 % (02/10 0726) Weight change:     Intake/Output from previous day: 02/09 0701 - 02/10 0700 In: 1040 [I.V.:993.8; IV Piggyback:46.3] Out: 500 [Urine:500] Intake/Output this shift: No intake/output data recorded.  PHYSICAL EXAM: Awake and alert.  Breathing and talking clearly.  No difficulty swallowing.  Firm slightly tender induration centered around the right parotid.  The remainder of the face and neck are soft and free of any swelling.  There is no trismus.  Oral cavity and pharynx are clear.  Lab Results: Recent Labs    01/26/23 0853 01/27/23 0326  WBC 14.1* 14.0*  HGB 10.2* 10.1*  HCT 32.7* 31.0*  PLT 175 194   BMET Recent Labs    01/26/23 0250 01/26/23 0853 01/27/23 0326  NA 139  --  138  K 3.4*  --  3.1*  CL 101  --  102  CO2 22  --  23  GLUCOSE 131*  --  122*  BUN 124*  --  109*  CREATININE 4.91* 4.60* 4.44*  CALCIUM 9.4  --  8.9    Studies/Results: US RENAL  Result Date: 01/26/2023 CLINICAL DATA:  Acute kidney injury. EXAM: RENAL / URINARY TRACT ULTRASOUND COMPLETE COMPARISON:  Renal ultrasound 04/19/2021 FINDINGS: Right Kidney: Renal measurements: 7.4 x 3.4 x 4.6 cm = volume: 61 mL. Echogenicity within normal limits. No mass or hydronephrosis visualized. Left Kidney: Renal measurements: 9.6 x 4.0 x 6.3 cm = volume: 127 mL. Increased parenchymal echogenicity. No solid mass or hydronephrosis. Unchanged 3.2 cm simple appearing cyst for which no follow-up imaging is recommended. Bladder: Appears normal for  degree of bladder distention. Other: None. IMPRESSION: 1. No hydronephrosis. 2. Chronic, asymmetrically severe right renal atrophy. Electronically Signed   By: Logan Bores M.D.   On: 01/26/2023 10:34   CT Maxillofacial Wo Contrast  Result Date: 01/26/2023 CLINICAL DATA:  82 year old female with delirium, redness and swelling at the right upper jaw, neck soft tissue swelling and induration with pain. The patient has baseline renal insufficiency (baseline creatinine of about 3) and further elevated creatinine in now to 5. IV contrast was deferred. EXAM: CT NECK WITHOUT CONTRAST CT MAXILLOFACIAL WITHOUT CONTRAST TECHNIQUE: Multidetector CT imaging of the neck, face is most was performed following the standard protocol without intravenous contrast. RADIATION DOSE REDUCTION: This exam was performed according to the departmental dose-optimization program which includes automated exposure control, adjustment of the mA and/or kV according to patient size and/or use of iterative reconstruction technique. COMPARISON:  Head CT today reported separately. FINDINGS: Pharynx and larynx: Inflammation throughout the right parapharyngeal space, least affecting the level of the nasopharynx. Asymmetry of the pharynx and supraglottic larynx with evidence of swollen right lateral pharyngeal wall. But no obvious tonsillar enlargement. Epiglottis remains within normal limits. Fluid and/or inflammation throughout the retropharyngeal space, most apparent from the C2 to the C4 cervical level as seen on sagittal image 46 of neck series 5. This is contiguous with both carotid spaces. The left parapharyngeal space is relatively normal. Salivary glands: Noncontrast  sublingual space seems to remain within normal limits. Diffuse soft tissue swelling and inflammation throughout the right submandibular space, right submandibular gland is largely obscured. And subcutaneous soft tissue inflammation tracks across midline to the left at the level of  the hyoid and submental soft tissues, and the left submandibular space and gland also seems somewhat inflamed. Diffusely enlarged and inflamed right parotid gland. Widespread regional subcutaneous inflammation, thickening of the platysma, and inflammation in the regional right neck lymph node stations. Contralateral left parotid gland appears within normal limits. No sialolithiasis identified. No dilatation of the right parotid duct is evident. Thyroid: Heterogeneous.  No significant regional mass effect. Lymph nodes: Inflammation throughout the right neck lymph node stations and small but reactive appearing right level 1, level 2, and level 3 lymph nodes. Evaluation for cystic or necrotic node limited in the of the lack of IV contrast. Contralateral left cervical lymph node stations remain within normal limits. Vascular: Vascular patency is not evaluated in the absence of IV contrast. And there is a conspicuous appearance of the noncontrast right internal jugular vein in the lower neck on coronal image 54 of neck series 6. Superimposed bulky calcified right carotid bifurcation atherosclerosis. Limited intracranial: Head CT is reported separately. Visualized orbits: See Head CT reported separately. Mastoids and visualized paranasal sinuses: Best demonstrated on the face CT, the bilateral tympanic cavities and mastoids are clear. Paranasal sinuses are well aerated with of left sphenoid sinus 16 mm mucous retention cyst and otherwise only mild scattered paranasal sinus mucosal thickening. Nasal cavity demonstrates symmetric mucosal thickening. Skeleton: Best demonstrated on the CT face, much of the posterior mandible and maxillary dentition is absent. The residual mostly anterior maxillary and mandible dentition is diffusely carious and poor. However, periapical lucency more affects the maxilla than the mandible, and none of these seem to be the epicenter of inflammation described above. Mandible overall intact and  aligned. Superimposed advanced cervical spine degeneration. No cervical endplate erosion is evident. No acute or suspicious osseous lesion identified. Upper chest: Calcified aortic atherosclerosis. No definite superior mediastinal inflammation. Lung apices are relatively clear with subtle centrilobular emphysema. Other findings: Right periauricular soft tissue inflammation in association with the right parotid gland findings. But the right pinna does not appear significantly swollen. The superficial portion of the right external auditory canal is inflamed and thickened, but the EAC remains patent. IMPRESSION: 1. Marked inflammatory process throughout the right neck, extending from the superficial subcutaneous tissues into the deep spaces including the right pharynx, parapharyngeal and retropharyngeal spaces. Etiology indeterminate on this noncontrast exam and differential considerations fairly broad, including Severe right Sialadenitis, complicated Pharyngitis, complicated Cellulitis, Retropharyngeal Infection, acute Right Internal Jugular Vein Thrombosis. Recommend a follow-up Neck CT with IV contrast when feasible. Failing that, a noncontrast Neck MRI might allow narrowing of the differential diagnosis. 2. Pertinent negatives include normal right middle ear and mastoids, no significant paranasal sinus inflammation, no obvious osteomyelitis, visible lung apices clear and poor dentition although seems not directly related to the right neck inflammation. 3. Salient findings were discussed by telephone with Erenest Rasher, PA on 01/26/2023 at 0440 hours Electronically Signed   By: Genevie Ann M.D.   On: 01/26/2023 04:58   CT Soft Tissue Neck Wo Contrast  Result Date: 01/26/2023 CLINICAL DATA:  82 year old female with delirium, redness and swelling at the right upper jaw, neck soft tissue swelling and induration with pain. The patient has baseline renal insufficiency (baseline creatinine of about 3) and further  elevated creatinine  in now to 5. IV contrast was deferred. EXAM: CT NECK WITHOUT CONTRAST CT MAXILLOFACIAL WITHOUT CONTRAST TECHNIQUE: Multidetector CT imaging of the neck, face is most was performed following the standard protocol without intravenous contrast. RADIATION DOSE REDUCTION: This exam was performed according to the departmental dose-optimization program which includes automated exposure control, adjustment of the mA and/or kV according to patient size and/or use of iterative reconstruction technique. COMPARISON:  Head CT today reported separately. FINDINGS: Pharynx and larynx: Inflammation throughout the right parapharyngeal space, least affecting the level of the nasopharynx. Asymmetry of the pharynx and supraglottic larynx with evidence of swollen right lateral pharyngeal wall. But no obvious tonsillar enlargement. Epiglottis remains within normal limits. Fluid and/or inflammation throughout the retropharyngeal space, most apparent from the C2 to the C4 cervical level as seen on sagittal image 46 of neck series 5. This is contiguous with both carotid spaces. The left parapharyngeal space is relatively normal. Salivary glands: Noncontrast sublingual space seems to remain within normal limits. Diffuse soft tissue swelling and inflammation throughout the right submandibular space, right submandibular gland is largely obscured. And subcutaneous soft tissue inflammation tracks across midline to the left at the level of the hyoid and submental soft tissues, and the left submandibular space and gland also seems somewhat inflamed. Diffusely enlarged and inflamed right parotid gland. Widespread regional subcutaneous inflammation, thickening of the platysma, and inflammation in the regional right neck lymph node stations. Contralateral left parotid gland appears within normal limits. No sialolithiasis identified. No dilatation of the right parotid duct is evident. Thyroid: Heterogeneous.  No significant regional  mass effect. Lymph nodes: Inflammation throughout the right neck lymph node stations and small but reactive appearing right level 1, level 2, and level 3 lymph nodes. Evaluation for cystic or necrotic node limited in the of the lack of IV contrast. Contralateral left cervical lymph node stations remain within normal limits. Vascular: Vascular patency is not evaluated in the absence of IV contrast. And there is a conspicuous appearance of the noncontrast right internal jugular vein in the lower neck on coronal image 54 of neck series 6. Superimposed bulky calcified right carotid bifurcation atherosclerosis. Limited intracranial: Head CT is reported separately. Visualized orbits: See Head CT reported separately. Mastoids and visualized paranasal sinuses: Best demonstrated on the face CT, the bilateral tympanic cavities and mastoids are clear. Paranasal sinuses are well aerated with of left sphenoid sinus 16 mm mucous retention cyst and otherwise only mild scattered paranasal sinus mucosal thickening. Nasal cavity demonstrates symmetric mucosal thickening. Skeleton: Best demonstrated on the CT face, much of the posterior mandible and maxillary dentition is absent. The residual mostly anterior maxillary and mandible dentition is diffusely carious and poor. However, periapical lucency more affects the maxilla than the mandible, and none of these seem to be the epicenter of inflammation described above. Mandible overall intact and aligned. Superimposed advanced cervical spine degeneration. No cervical endplate erosion is evident. No acute or suspicious osseous lesion identified. Upper chest: Calcified aortic atherosclerosis. No definite superior mediastinal inflammation. Lung apices are relatively clear with subtle centrilobular emphysema. Other findings: Right periauricular soft tissue inflammation in association with the right parotid gland findings. But the right pinna does not appear significantly swollen. The  superficial portion of the right external auditory canal is inflamed and thickened, but the EAC remains patent. IMPRESSION: 1. Marked inflammatory process throughout the right neck, extending from the superficial subcutaneous tissues into the deep spaces including the right pharynx, parapharyngeal and retropharyngeal spaces. Etiology indeterminate on this  noncontrast exam and differential considerations fairly broad, including Severe right Sialadenitis, complicated Pharyngitis, complicated Cellulitis, Retropharyngeal Infection, acute Right Internal Jugular Vein Thrombosis. Recommend a follow-up Neck CT with IV contrast when feasible. Failing that, a noncontrast Neck MRI might allow narrowing of the differential diagnosis. 2. Pertinent negatives include normal right middle ear and mastoids, no significant paranasal sinus inflammation, no obvious osteomyelitis, visible lung apices clear and poor dentition although seems not directly related to the right neck inflammation. 3. Salient findings were discussed by telephone with Erenest Rasher, PA on 01/26/2023 at 0440 hours Electronically Signed   By: Genevie Ann M.D.   On: 01/26/2023 04:58   CT Head Wo Contrast  Result Date: 01/26/2023 CLINICAL DATA:  82 year old female with delirium, redness and swelling at the right upper jaw, neck soft tissue swelling and induration with pain. EXAM: CT HEAD WITHOUT CONTRAST TECHNIQUE: Contiguous axial images were obtained from the base of the skull through the vertex without intravenous contrast. RADIATION DOSE REDUCTION: This exam was performed according to the departmental dose-optimization program which includes automated exposure control, adjustment of the mA and/or kV according to patient size and/or use of iterative reconstruction technique. COMPARISON:  CT face and neck today reported separately. Head CT 09/02/2007. FINDINGS: Brain: Cerebral volume loss since 2008 appears fairly generalized. No midline shift,  ventriculomegaly, mass effect, evidence of mass lesion, intracranial hemorrhage or evidence of cortically based acute infarction. Mild to moderate patchy bilateral cerebral white matter hypodensity is new or progressed since 2008. And there is heterogeneity in the right thalamus compatible with a small age indeterminate lacunar infarct there. No cortical encephalomalacia identified. Vascular: Calcified atherosclerosis at the skull base. No suspicious intracranial vascular hyperdensity. Skull: Osteopenia. Motion artifact at the skull base. No acute osseous abnormality identified. Sinuses/Orbits: Paranasal sinuses are well aerated. There is a left sphenoid mucous retention cysts. Tympanic cavities and mastoids appear clear. Other: Calcified scalp vessel atherosclerosis. Partially visible widespread subcutaneous inflammation in the region of the right ear, tracking inferiorly. See comparison neck and face CTs. Orbits soft tissues appear negative. IMPRESSION: 1. Progressed small vessel disease since 2008, including in the right thalamus which is age indeterminate. 2. No other acute intracranial abnormality identified. 3. Partially visible widespread superficial inflammation surrounding the right ear. But right middle ear and mastoids remain clear. See comparison Neck and Face CT reported separately. Electronically Signed   By: Genevie Ann M.D.   On: 01/26/2023 04:35   DG Chest Port 1 View  Result Date: 01/26/2023 CLINICAL DATA:  Questionable sepsis EXAM: PORTABLE CHEST 1 VIEW COMPARISON:  12/11/2008 FINDINGS: Heart and mediastinal contours are within normal limits. No focal opacities or effusions. No acute bony abnormality. Aortic atherosclerosis. IMPRESSION: No active disease. Electronically Signed   By: Rolm Baptise M.D.   On: 01/26/2023 02:51    Medications: I have reviewed the patient's current medications.  Assessment/Plan: Early improvement of sialoadenitis.  Continue IV antibiotics and fluids.  No obvious  drainable process.  Continue medical care.  LOS: 1 day    K11.20  Medical Decision Making: #/Complex Problems: 2  Data Reviewed:1  Management:2 (1-Straightforward, 2-Low, 3-Moderate, 4-High)   Izora Gala 01/27/2023, 10:58 AM

## 2023-01-28 DIAGNOSIS — L03221 Cellulitis of neck: Secondary | ICD-10-CM | POA: Diagnosis not present

## 2023-01-28 DIAGNOSIS — N179 Acute kidney failure, unspecified: Secondary | ICD-10-CM | POA: Diagnosis not present

## 2023-01-28 DIAGNOSIS — N184 Chronic kidney disease, stage 4 (severe): Secondary | ICD-10-CM | POA: Diagnosis not present

## 2023-01-28 DIAGNOSIS — L03211 Cellulitis of face: Secondary | ICD-10-CM | POA: Diagnosis not present

## 2023-01-28 LAB — RENAL FUNCTION PANEL
Albumin: 2.3 g/dL — ABNORMAL LOW (ref 3.5–5.0)
Anion gap: 12 (ref 5–15)
BUN: 103 mg/dL — ABNORMAL HIGH (ref 8–23)
CO2: 22 mmol/L (ref 22–32)
Calcium: 8.7 mg/dL — ABNORMAL LOW (ref 8.9–10.3)
Chloride: 103 mmol/L (ref 98–111)
Creatinine, Ser: 4.28 mg/dL — ABNORMAL HIGH (ref 0.44–1.00)
GFR, Estimated: 10 mL/min — ABNORMAL LOW (ref >60)
Glucose, Bld: 108 mg/dL — ABNORMAL HIGH (ref 70–99)
Phosphorus: 2.9 mg/dL (ref 2.5–4.6)
Potassium: 3.6 mmol/L (ref 3.5–5.1)
Sodium: 137 mmol/L (ref 135–145)

## 2023-01-28 MED ORDER — AMLODIPINE BESYLATE 5 MG PO TABS
5.0000 mg | ORAL_TABLET | Freq: Every day | ORAL | Status: DC
  Administered 2023-01-29 – 2023-01-31 (×3): 5 mg via ORAL
  Filled 2023-01-28 (×3): qty 1

## 2023-01-28 MED ORDER — TRAZODONE HCL 50 MG PO TABS
50.0000 mg | ORAL_TABLET | Freq: Every day | ORAL | Status: DC
  Administered 2023-01-28 – 2023-01-30 (×3): 50 mg via ORAL
  Filled 2023-01-28 (×3): qty 1

## 2023-01-28 MED ORDER — METOPROLOL SUCCINATE ER 25 MG PO TB24
25.0000 mg | ORAL_TABLET | Freq: Every day | ORAL | Status: DC
  Administered 2023-01-28 – 2023-01-31 (×4): 25 mg via ORAL
  Filled 2023-01-28 (×4): qty 1

## 2023-01-28 MED ORDER — SODIUM CHLORIDE 0.9 % IV SOLN: INTRAVENOUS | Status: AC

## 2023-01-28 MED ORDER — GABAPENTIN 100 MG PO CAPS
100.0000 mg | ORAL_CAPSULE | Freq: Two times a day (BID) | ORAL | Status: DC
  Administered 2023-01-28 – 2023-01-31 (×7): 100 mg via ORAL
  Filled 2023-01-28 (×7): qty 1

## 2023-01-28 NOTE — Progress Notes (Addendum)
[facility] Progress Note   Subjective:   feeling better - taking some po intake now. Ambulating, using bathroom.  Daughter remains bedside.  Pleased with progress.  No LUTs, no edema or orthonpea.  UOP  has not been recorded despite I/Os order but she says normal, per daughter " a lot this morning"  Objective Vitals:   01/27/23 1900 01/28/23 0317 01/28/23 0500 01/28/23 0855  BP: 115/64 (!) 111/56  132/60  Pulse: 86 80  80  Resp: 16   16  Temp: 98.5 F (36.9 C) 98.1 F (36.7 C)  98.6 F (37 C)  TempSrc: Oral Oral  Oral  SpO2: 97% 94%  97%  Weight:   66.4 kg   Height:       Physical Exam PHYSICAL EXAM: Gen: elderly woman lying calmly in bed, awake and alert Eyes: anicteric  ENT: MMM, R mandibular swelling and TTP - improved c/w yesterday but still erythematous at angle of mandible, poor dentition Neck: no swelling or JVD CV: RRR no rub Abd: soft Lungs: clear GU: no foley Extr: no edema Neuro: awake and alert - much improved  Additional Objective Labs: Basic Metabolic Panel: Recent Labs  Lab 01/26/23 0250 01/26/23 0853 01/27/23 0326 01/28/23 0248  NA 139  --  138 137  K 3.4*  --  3.1* 3.6  CL 101  --  102 103  CO2 22  --  23 22  GLUCOSE 131*  --  122* 108*  BUN 124*  --  109* 103*  CREATININE 4.91* 4.60* 4.44* 4.28*  CALCIUM 9.4  --  8.9 8.7*  PHOS  --   --   --  2.9    Liver Function Tests: Recent Labs  Lab 01/26/23 0250 01/28/23 0248  AST 11*  --   ALT 8  --   ALKPHOS 45  --   BILITOT 0.8  --   PROT 6.7  --   ALBUMIN 3.1* 2.3*    No results for input(s): "LIPASE", "AMYLASE" in the last 168 hours. CBC: Recent Labs  Lab 01/26/23 0250 01/26/23 0853 01/27/23 0326  WBC 13.0* 14.1* 14.0*  NEUTROABS 10.2*  --   --   HGB 10.3* 10.2* 10.1*  HCT 32.3* 32.7* 31.0*  MCV 101.3* 102.2* 99.7  PLT 171 175 194    Blood Culture    Component Value Date/Time   SDES BLOOD LEFT ANTECUBITAL 01/26/2023 0300   SDES BLOOD LEFT WRIST  01/26/2023 0300   SPECREQUEST  01/26/2023 0300    BOTTLES DRAWN AEROBIC AND ANAEROBIC Blood Culture adequate volume   SPECREQUEST  01/26/2023 0300    BOTTLES DRAWN AEROBIC AND ANAEROBIC Blood Culture results may not be optimal due to an inadequate volume of blood received in culture bottles   CULT  01/26/2023 0300    NO GROWTH 2 DAYS Performed at [facility], [place] [address], [place], [place] [address]    CULT  01/26/2023 0300    NO GROWTH 2 DAYS Performed at [facility] [address] [place], [place] [address]    REPTSTATUS PENDING 01/26/2023 0300   REPTSTATUS PENDING 01/26/2023 0300    Cardiac Enzymes: No results for input(s): "CKTOTAL", "CKMB", "CKMBINDEX", "TROPONINI" in the last 168 hours. CBG: No results for input(s): "GLUCAP" in the last 168 hours. Iron Studies: No results for input(s): "IRON", "TIBC", "TRANSFERRIN", "FERRITIN" in the last 72 hours. @lablastinr3$ @ Studies/Results: No results found. Medications:  clindamycin (CLEOCIN) IV 600 mg (01/28/23 0556)   lactated ringers  75 mL/hr at 01/27/23 2332    amLODipine  10 mg Oral Daily   ascorbic acid  500 mg Oral Daily   atorvastatin  40 mg Oral Daily   calcium-vitamin D  1 tablet Oral Q breakfast   cholecalciferol  1,000 Units Oral Daily   enoxaparin (LOVENOX) injection  30 mg Subcutaneous Daily   ferrous sulfate  325 mg Oral Q breakfast   gabapentin  100 mg Oral BID   hydrALAZINE  50 mg Oral TID   metoprolol succinate  25 mg Oral Daily   mirtazapine  30 mg Oral QHS   pantoprazole  40 mg Oral Daily   sertraline  100 mg Oral Daily   traZODone  50 mg Oral QHS    Assessment/Plan **mandibular swelling:  Imaging inconclusive.  ENT c/s - likely sialadenitis, cellulitis.  Being covered with appropriate antibiotics, heat therapy.   improving   **AKI on CKD 4: Baseline Cr low 3s.  Now presenting with BUN 124, Cr 4.9 in setting of poor po intake related to mandibular swelling. Renal US no obstruction.  UA  bland except mild protein (not a new finding). Electrolytes/bicarb acceptable.  DIscussed with daughter -- she would never accept dialysis per prior conversation.   Continued improvement to to Cr 4.3 today, BUN 103. Cont IV hydration x 1 more day (ordered NS 75/hr x 24h) and supportive care, following serial labs.  Strict I/Os.  Avoid nephrotoxins, avoid hypotension.  I think she's likely to continue to  turn around.   **Possible RIJ DVT: agree with avoiding iodinated contrast, now ruled out with venous doppler   **Anemia:  receives ESA q2wks outpt and IV iron PRN.  Hb 10s, stable.    **HTN: high in setting of missed meds --now improved after taking home meds  and actually a bit low now, decrease amlodipine from 10 to 5.   **DM: per primary   Nothing further to add, will sign off.  Her next ESA dose is due next week and I suspect she'll be out of the hosp by then and can go.  Has appt coming up with Dr Royce Macadamia she can attend.     Jannifer Hick MD 01/28/2023, 10:23 AM  [facility] Pager: [phone]

## 2023-01-28 NOTE — Progress Notes (Signed)
   01/28/23 1504  Mobility  Activity Ambulated with assistance in hallway  Level of Assistance Standby assist, set-up cues, supervision of patient - no hands on  Assistive Device Front wheel walker  Distance Ambulated (ft) 300 ft  Activity Response Tolerated well  Mobility Referral Yes  $Mobility charge 1 Mobility   Mobility Specialist Progress Note  Pt was in bed and agreeable. Had c/o the right side of her face itching. Returned to bed w/ all needs met and call bell in reach.  Lucious Groves Mobility Specialist  Please contact via SecureChat or Rehab office at [phone]

## 2023-01-28 NOTE — Plan of Care (Signed)
  Problem: Education: Goal: Knowledge of General Education information will improve Description: Including pain rating scale, medication(s)/side effects and non-pharmacologic comfort measures Outcome: Progressing   Problem: Health Behavior/Discharge Planning: Goal: Ability to manage health-related needs will improve Outcome: Progressing   Problem: Activity: Goal: Risk for activity intolerance will decrease Outcome: Progressing   

## 2023-01-28 NOTE — Progress Notes (Signed)
[facility] PROGRESS NOTE    Progress Note  Erica Whitney  [id number] DOB: 1941-05-09 DOA: 01/26/2023 PCP: Nolene Ebbs, MD     Brief Narrative:   Erica Whitney is an 82 y.o. female past medical history of breast cancer, chronic NEC stage IIIa, anemia of chronic disease, history of peptic ulcer borderline diabetes mellitus brought in by the daughter noticing right mandibular swelling and redness, according to the daughter she is only being tolerating liquids over the last 3 to 4 days noticed yesterday her face was swollen extending into the neck and patient appeared confused white count of 13,000 and was found to have facial cellulitis.    Assessment/Plan:   Right facial Cellulitis likely due to CSialodenitis ENT has been consulted continue IV clindamycin. Has remained afebrile Blood cultures have been negative till date. PT recommended home health PT  Acute on chronic kidney disease stage IV: With a baseline creatinine of 3-4, on admission creatinine 4.9 likely prerenal azotemia in the setting of decreased oral intake. There is no indication for dialysis right now, but the nephrologist spoke to the daughter and she would never want dialysis.  Will continue with supportive care avoid nephrotoxic drug. KVO IV fluids.  Acute metabolic encephalopathy: Multifactorial likely due to infectious etiology, pharmacology as she takes Neurontin plus or minus elevated BUN. Now resolved can restart Neurontin at a lower dose.  Anemia of chronic renal disease: Hemoglobin stable continue to monitor.  Essential hypertension: Blood pressure is improved this morning will restart home regimen. IV hydralazine as needed.  Diabetes mellitus type 2 uncontrolled: Hemoglobin A1c of 4.4 which means her blood glucose actually well-controlled she is currently off any oral hypoglycemic agents at home there is probably due to to the deterioration of renal function.  Peptic ulcer  disease: Continue PPI.  History of breast cancer: Noted.   DVT prophylaxis: lovenox Family Communication:Daughter Status is: Inpatient Remains inpatient appropriate because: Right facial cellulitis    Code Status:     Code Status Orders  (From admission, onward)           Start     Ordered   01/26/23 0743  Full code  Continuous       Question:  By:  Answer:  Consent: discussion documented in EHR   01/26/23 0750           Code Status History     Date Active Date Inactive Code Status Order ID Comments User Context   05/25/2021 2115 05/30/2021 2024 Full Code [id number]  Orma Flaming, MD ED   05/13/2021 1446 05/16/2021 1821 Full Code [id number]  Norins, Heinz Knuckles, MD ED         IV Access:   Peripheral IV   Procedures and diagnostic studies:   US RENAL  Result Date: 01/26/2023 CLINICAL DATA:  Acute kidney injury. EXAM: RENAL / URINARY TRACT ULTRASOUND COMPLETE COMPARISON:  Renal ultrasound 04/19/2021 FINDINGS: Right Kidney: Renal measurements: 7.4 x 3.4 x 4.6 cm = volume: 61 mL. Echogenicity within normal limits. No mass or hydronephrosis visualized. Left Kidney: Renal measurements: 9.6 x 4.0 x 6.3 cm = volume: 127 mL. Increased parenchymal echogenicity. No solid mass or hydronephrosis. Unchanged 3.2 cm simple appearing cyst for which no follow-up imaging is recommended. Bladder: Appears normal for degree of bladder distention. Other: None. IMPRESSION: 1. No hydronephrosis. 2. Chronic, asymmetrically severe right renal atrophy. Electronically Signed   By: Logan Bores M.D.   On: 01/26/2023 10:34     Medical Consultants:  None.   Subjective:    Erica Whitney relates her pain is better today.  Objective:    Vitals:   01/27/23 1900 01/28/23 0317 01/28/23 0500 01/28/23 0855  BP: 115/64 (!) 111/56  132/60  Pulse: 86 80  80  Resp: 16   16  Temp: 98.5 F (36.9 C) 98.1 F (36.7 C)  98.6 F (37 C)  TempSrc: Oral Oral  Oral  SpO2: 97% 94%  97%   Weight:   66.4 kg   Height:       SpO2: 97 % O2 Flow Rate (L/min): 2 L/min  No intake or output data in the 24 hours ending 01/28/23 0859  Filed Weights   01/26/23 0222 01/28/23 0500  Weight: 71 kg 66.4 kg    Exam: General exam: In no acute distress. Respiratory system: Good air movement and clear to auscultation. Cardiovascular system: S1 & S2 heard, RRR. No JVD. Gastrointestinal system: Abdomen is nondistended, soft and nontender.  Skin: Erythema and swelling of the right side of her face is improved Psychiatry: Judgement and insight appear normal. Mood & affect appropriate.   Data Reviewed:    Labs: Basic Metabolic Panel: Recent Labs  Lab 01/26/23 0250 01/26/23 0853 01/27/23 0326 01/28/23 0248  NA 139  --  138 137  K 3.4*  --  3.1* 3.6  CL 101  --  102 103  CO2 22  --  23 22  GLUCOSE 131*  --  122* 108*  BUN 124*  --  109* 103*  CREATININE 4.91* 4.60* 4.44* 4.28*  CALCIUM 9.4  --  8.9 8.7*  PHOS  --   --   --  2.9    GFR Estimated Creatinine Clearance: 9.6 mL/min (A) (by C-G formula based on SCr of 4.28 mg/dL (H)). Liver Function Tests: Recent Labs  Lab 01/26/23 0250 01/28/23 0248  AST 11*  --   ALT 8  --   ALKPHOS 45  --   BILITOT 0.8  --   PROT 6.7  --   ALBUMIN 3.1* 2.3*    No results for input(s): "LIPASE", "AMYLASE" in the last 168 hours. No results for input(s): "AMMONIA" in the last 168 hours. Coagulation profile Recent Labs  Lab 01/26/23 0250  INR 1.2    COVID-19 Labs  No results for input(s): "DDIMER", "FERRITIN", "LDH", "CRP" in the last 72 hours.   Lab Results  Component Value Date   SARSCOV2NAA NEGATIVE 05/25/2021   [place] NEGATIVE 05/13/2021    CBC: Recent Labs  Lab 01/24/23 0807 01/26/23 0250 01/26/23 0853 01/27/23 0326  WBC  --  13.0* 14.1* 14.0*  NEUTROABS  --  10.2*  --   --   HGB 10.5* 10.3* 10.2* 10.1*  HCT  --  32.3* 32.7* 31.0*  MCV  --  101.3* 102.2* 99.7  PLT  --  171 175 194    Cardiac  Enzymes: No results for input(s): "CKTOTAL", "CKMB", "CKMBINDEX", "TROPONINI" in the last 168 hours. BNP (last 3 results) No results for input(s): "PROBNP" in the last 8760 hours. CBG: No results for input(s): "GLUCAP" in the last 168 hours. D-Dimer: No results for input(s): "DDIMER" in the last 72 hours. Hgb A1c: Recent Labs    01/26/23 0250  HGBA1C 4.4*    Lipid Profile: No results for input(s): "CHOL", "HDL", "LDLCALC", "TRIG", "CHOLHDL", "LDLDIRECT" in the last 72 hours. Thyroid function studies: No results for input(s): "TSH", "T4TOTAL", "T3FREE", "THYROIDAB" in the last 72 hours.  Invalid input(s): "FREET3" Anemia work  up: No results for input(s): "VITAMINB12", "FOLATE", "FERRITIN", "TIBC", "IRON", "RETICCTPCT" in the last 72 hours.  Sepsis Labs: Recent Labs  Lab 01/26/23 0250 01/26/23 0556 01/26/23 0853 01/27/23 0326  WBC 13.0*  --  14.1* 14.0*  LATICACIDVEN 0.8 0.7  --   --     Microbiology Recent Results (from the past 240 hour(s))  Blood Culture (routine x 2)     Status: None (Preliminary result)   Collection Time: 01/26/23  3:00 AM   Specimen: BLOOD  Result Value Ref Range Status   Specimen Description BLOOD LEFT ANTECUBITAL  Final   Special Requests   Final    BOTTLES DRAWN AEROBIC AND ANAEROBIC Blood Culture adequate volume   Culture   Final    NO GROWTH 2 DAYS Performed at [facility], [address] [place], [place] [address]    Report Status PENDING  Incomplete  Blood Culture (routine x 2)     Status: None (Preliminary result)   Collection Time: 01/26/23  3:00 AM   Specimen: BLOOD LEFT WRIST  Result Value Ref Range Status   Specimen Description BLOOD LEFT WRIST  Final   Special Requests   Final    BOTTLES DRAWN AEROBIC AND ANAEROBIC Blood Culture results may not be optimal due to an inadequate volume of blood received in culture bottles   Culture   Final    NO GROWTH 2 DAYS Performed at [facility], [place] [address]  [place], [place] [address]    Report Status PENDING  Incomplete     Medications:    amLODipine  10 mg Oral Daily   ascorbic acid  500 mg Oral Daily   atorvastatin  40 mg Oral Daily   calcium-vitamin D  1 tablet Oral Q breakfast   cholecalciferol  1,000 Units Oral Daily   enoxaparin (LOVENOX) injection  30 mg Subcutaneous Daily   ferrous sulfate  325 mg Oral Q breakfast   hydrALAZINE  50 mg Oral TID   mirtazapine  30 mg Oral QHS   pantoprazole  40 mg Oral Daily   sertraline  100 mg Oral Daily   Continuous Infusions:  clindamycin (CLEOCIN) IV 600 mg (01/28/23 0556)   lactated ringers 75 mL/hr at 01/27/23 2332      LOS: 2 days   Charlynne Cousins  [facility]  01/28/2023, 8:59 AM

## 2023-01-29 DIAGNOSIS — N184 Chronic kidney disease, stage 4 (severe): Secondary | ICD-10-CM | POA: Diagnosis not present

## 2023-01-29 DIAGNOSIS — N179 Acute kidney failure, unspecified: Secondary | ICD-10-CM | POA: Diagnosis not present

## 2023-01-29 DIAGNOSIS — L03221 Cellulitis of neck: Secondary | ICD-10-CM | POA: Diagnosis not present

## 2023-01-29 LAB — RENAL FUNCTION PANEL
Albumin: 2.3 g/dL — ABNORMAL LOW (ref 3.5–5.0)
Anion gap: 11 (ref 5–15)
BUN: 95 mg/dL — ABNORMAL HIGH (ref 8–23)
CO2: 21 mmol/L — ABNORMAL LOW (ref 22–32)
Calcium: 8.4 mg/dL — ABNORMAL LOW (ref 8.9–10.3)
Chloride: 107 mmol/L (ref 98–111)
Creatinine, Ser: 4.22 mg/dL — ABNORMAL HIGH (ref 0.44–1.00)
GFR, Estimated: 10 mL/min — ABNORMAL LOW (ref >60)
Glucose, Bld: 101 mg/dL — ABNORMAL HIGH (ref 70–99)
Phosphorus: 2.6 mg/dL (ref 2.5–4.6)
Potassium: 3.4 mmol/L — ABNORMAL LOW (ref 3.5–5.1)
Sodium: 139 mmol/L (ref 135–145)

## 2023-01-29 MED ORDER — POTASSIUM CHLORIDE CRYS ER 20 MEQ PO TBCR
40.0000 meq | EXTENDED_RELEASE_TABLET | Freq: Two times a day (BID) | ORAL | Status: AC
  Administered 2023-01-29 (×2): 40 meq via ORAL
  Filled 2023-01-29 (×2): qty 2

## 2023-01-29 NOTE — Progress Notes (Signed)
Subjective: No new complaints, continues to improve  Objective: Vital signs in last 24 hours: Temp:  [98.5 F (36.9 C)] 98.5 F (36.9 C) (02/12 0430) Pulse Rate:  [71-79] 79 (02/12 0430) Resp:  [16-17] 17 (02/12 0430) BP: (107-145)/(52-61) 145/61 (02/12 0430) SpO2:  [95 %-100 %] 97 % (02/12 0430) Weight:  [62.3 kg] 62.3 kg (02/12 0500) Weight change: -4.1 kg Last BM Date : 01/29/23  Intake/Output from previous day: 02/11 0701 - 02/12 0700 In: 3346.6 [P.O.:360; I.V.:2986.6] Out: -  Intake/Output this shift: No intake/output data recorded.  PHYSICAL EXAM: Awake and alert.  Right parotid indurated and tender.  Erythema appears to be resolved.  There is no fluctuance is obvious.  Lab Results: Recent Labs    01/27/23 0326  WBC 14.0*  HGB 10.1*  HCT 31.0*  PLT 194   BMET Recent Labs    01/28/23 0248 01/29/23 0308  NA 137 139  K 3.6 3.4*  CL 103 107  CO2 22 21*  GLUCOSE 108* 101*  BUN 103* 95*  CREATININE 4.28* 4.22*  CALCIUM 8.7* 8.4*    Studies/Results: No results found.  Medications: I have reviewed the patient's current medications.  Assessment/Plan: Continued improvement in parotid sialoadenitis.  She has been afebrile for a few days.  Can probably transition to oral antibiotic in the next 24 hours or so.  Continue for about 10 more days.  Follow-up as an outpatient if needed.  LOS: 3 days    Medical Decision Making: #/Complex Problems: 2  Data Reviewed:1  Management:2 (1-Straightforward, 2-Low, 3-Moderate, 4-High)   Izora Gala 01/29/2023, 9:09 AM

## 2023-01-29 NOTE — Progress Notes (Signed)
Speech Language Pathology Treatment:    Patient Details Name: Erica Whitney MRN: [id number] DOB: Nov 22, 1941 Today's Date: 01/29/2023 Time: [id number] SLP Time Calculation (min) (ACUTE ONLY): 12 min  Assessment / Plan / Recommendation Clinical Impression  Pt seen for ongoing dysphagia management but continues to improve.  There is still right sided facial swelling around jaw and ear.  Skin is warm and pt reports tenderness when touched.  Pt has been tolerating present diet without difficulty.  Today pt tolerated trials of soft solids with no clinical s/s of aspiration.  With regular texture solid, pt requested puree to help moisten bolus.  Pt with good oral clearance of solids.  Pt may advance to mechanical soft solids.  This is for comfort and ease of intake.  There are no specific safety concerns with solid textures.  Advised daughter to choose soft foods as needed and advance to regular texture diet as tolerated.  SLP will continue to follow while in house for diet tolerance/advancement, but pt does not need specific SLP follow up after discharge.    Recommend mechanical soft solids with thin liquids.     HPI HPI: Erica Whitney is a 82 y.o. female who was brought in by daughter after noticing right mandibular swelling and redness.  CT Neck: "Inflammation throughout the right parapharyngeal space, least affecting the level of the nasopharynx. Asymmetry of the pharynx and supraglottic larynx with evidence of swollen right lateral pharyngeal wall. But no obvious tonsillar enlargement.  Epiglottis remains within normal limits. Fluid and/or inflammation throughout the retropharyngeal space. ... The left pharyngeal space is relatively normal."  Per ENT: "Right neck and face swelling- this could have various sources but most likely this is sialadenitis of the parotid." CXR 2/9 with no active disease. Pt with history of breast cancer, CKD, anemia of chronic disease, peptic ulcer disease, borderline  type 2 diabetes.      SLP Plan  Continue with current plan of care      Recommendations for follow up therapy are one component of a multi-disciplinary discharge planning process, led by the attending physician.  Recommendations may be updated based on patient status, additional functional criteria and insurance authorization.    Recommendations  Diet recommendations: Dysphagia 3 (mechanical soft);Thin liquid Liquids provided via: Cup;Straw Medication Administration: Whole meds with liquid (as tolerated) Supervision: Patient able to self feed;Staff to assist with self feeding Compensations: Slow rate;Small sips/bites Postural Changes and/or Swallow Maneuvers: Seated upright 90 degrees                Oral Care Recommendations: Oral care BID Follow Up Recommendations: No SLP follow up Assistance recommended at discharge: None SLP Visit Diagnosis: Dysphagia, unspecified (R13.10) Plan: Continue with current plan of care           Celedonio Savage, [place], [place] Office: [phone] 01/29/2023, 10:45 AM

## 2023-01-29 NOTE — Plan of Care (Signed)

## 2023-01-29 NOTE — TOC Initial Note (Addendum)
Transition of Care (TOC) - Initial/Assessment Note   Spoke to patient and daughter at bedside.   Patient from home alone. Has walker. Agreeable to home health PT.   Patient has a walker at home   [facility] with [facility] accepted referral. Will need order and face to face  Patient Details  Name: Erica Whitney MRN: [id number] Date of Birth: [birth year]  Transition of Care [facility]) CM/SW Contact:    Marilu Favre, RN Phone Number: 01/29/2023, 11:39 AM  Clinical Narrative:                   Expected Discharge Plan: [place] Barriers to Discharge: Continued Medical Work up   Patient Goals and CMS Choice Patient states their goals for this hospitalization and ongoing recovery are:: to return to home   Choice offered to / list presented to : Patient      Expected Discharge Plan and Services   Discharge Planning Services: CM Consult Post Acute Care Choice: [place] arrangements for the past 2 months: Single Family Home                 DME Arranged: N/A DME Agency: NA       HH Arranged: PT HH Agency: Veteran Date [facility] Agency Contacted: 01/29/23 Time HH Agency Contacted: 66 Representative spoke with at [place]: Tommi Rumps  Prior Living Arrangements/Services Living arrangements for the past 2 months: [place] Lives with:: Self Patient language and need for interpreter reviewed:: Yes Do you feel safe going back to the place where you live?: Yes      Need for Family Participation in Patient Care: Yes (Comment) Care giver support system in place?: Yes (comment) Current home services: DME Criminal Activity/Legal Involvement Pertinent to Current Situation/Hospitalization: No - Comment as needed  Activities of Daily Living Home Assistive Devices/Equipment: Cane (specify quad or straight), Walker (specify type) ADL Screening (condition at time of admission) Patient's cognitive ability adequate to safely complete daily  activities?: Yes Is the patient deaf or have difficulty hearing?: Yes Does the patient have difficulty seeing, even when wearing glasses/contacts?: No Does the patient have difficulty concentrating, remembering, or making decisions?: No Patient able to express need for assistance with ADLs?: Yes Does the patient have difficulty dressing or bathing?: No Independently performs ADLs?: Yes (appropriate for developmental age) Does the patient have difficulty walking or climbing stairs?: No Weakness of Legs: None Weakness of Arms/Hands: None  Permission Sought/Granted   Permission granted to share information with : Yes, Verbal Permission Granted  Share Information with NAME: daughter Kaeleen Powell  Permission granted to share info w AGENCY: [facility]        Emotional Assessment Appearance:: Appears stated age Attitude/Demeanor/Rapport: Engaged Affect (typically observed): Accepting Orientation: : Oriented to Self, Oriented to Place, Oriented to  Time, Oriented to Situation Alcohol / Substance Use: Not Applicable Psych Involvement: No (comment)  Admission diagnosis:  Cellulitis of neck [L03.221] Cellulitis [L03.90] AKI (acute kidney injury) ([place]) [N17.9] Patient Active Problem List   Diagnosis Date Noted   Cellulitis 123456   Acute metabolic encephalopathy 123456   AKI (acute kidney injury) ([place]) 01/26/2023   CKD (chronic kidney disease) stage 4, GFR 15-29 ml/min (HCC) 01/26/2023   Anemia in chronic kidney disease (CKD) 04/18/2022   Duodenal ulcer with hemorrhage    Acute blood loss anemia 05/25/2021   Rectal bleeding 05/14/2021   Bright red blood per rectum 05/13/2021   HTN (hypertension) 05/13/2021  PUD (peptic ulcer disease) 05/13/2021   DM2 (diabetes mellitus, type 2) ([place]) 05/13/2021   Osteopenia 01/13/2015   Uterine cancer ([place])    Breast cancer of upper-outer quadrant of right female breast ([place]) 11/06/2014   PCP:  Nolene Ebbs, MD Pharmacy:   [facility] ([place]), [place] - [address] [place] [address] Phone: [phone] Fax: [phone]     Social Determinants of Health (SDOH) Social History: [place]: No Food Insecurity (01/26/2023)  Housing: Low Risk  (01/26/2023)  Transportation Needs: No Transportation Needs (01/26/2023)  Utilities: Not At Risk (01/26/2023)  Tobacco Use: Medium Risk (01/26/2023)   SDOH Interventions:     Readmission Risk Interventions     No data to display

## 2023-01-29 NOTE — Progress Notes (Signed)
Mobility Specialist Progress Note    01/29/23 0929  Mobility  Activity Ambulated with assistance in hallway  Level of Assistance Contact guard assist, steadying assist  Assistive Device Front wheel walker  Distance Ambulated (ft) 240 ft  Activity Response Tolerated well  Mobility Referral Yes  $Mobility charge 1 Mobility   Pt received in bed and agreeable. No complaints on walk. Returned to BR to attempt BM. Encouraged to pull string when ready. NT aware.   Hildred Alamin Mobility Specialist  Please Psychologist, sport and exercise or Rehab Office at [phone]

## 2023-01-29 NOTE — Progress Notes (Signed)
[facility] PROGRESS NOTE    Progress Note  Erica Whitney  [id number] DOB: [birth year] DOA: 01/26/2023 PCP: Nolene Ebbs, MD     Brief Narrative:   Erica Whitney is an 82 y.o. female past medical history of breast cancer, chronic NEC stage IIIa, anemia of chronic disease, history of peptic ulcer borderline diabetes mellitus brought in by the daughter noticing right mandibular swelling and redness, according to the daughter she is only being tolerating liquids over the last 3 to 4 days noticed yesterday her face was swollen extending into the neck and patient appeared confused white count of 13,000 and was found to have facial cellulitis.    Assessment/Plan:   Right facial Cellulitis likely due to CSialodenitis ENT has been consulted, continue IV mycin for 24 hours. Has remained afebrile Blood cultures have been negative till date. She will need home PT.  Acute on chronic kidney disease stage IV: With a baseline creatinine of 3-4, on admission creatinine 4.9 likely prerenal azotemia in the setting of decreased oral intake. There is no indication for dialysis right now. Nephrology on board appreciate assistance.  Acute metabolic encephalopathy: Multifactorial likely due to infectious etiology, pharmacology as she takes Neurontin plus or minus elevated BUN. Now resolved can restart Neurontin at a lower dose.  Anemia of chronic renal disease: Hemoglobin stable continue to monitor.  Essential hypertension: Blood pressure is improved this morning will restart home regimen. IV hydralazine as needed.  Diabetes mellitus type 2 uncontrolled: Hemoglobin A1c of 4.4 which means her blood glucose actually well-controlled she is currently off any oral hypoglycemic agents at home there is probably due to to the deterioration of renal function.  Peptic ulcer disease: Continue PPI.  History of breast cancer: Noted.   DVT prophylaxis: lovenox Family  Communication:Daughter Status is: Inpatient Remains inpatient appropriate because: Right facial cellulitis    Code Status:     Code Status Orders  (From admission, onward)           Start     Ordered   01/26/23 0743  Full code  Continuous       Question:  By:  Answer:  Consent: discussion documented in EHR   01/26/23 0750           Code Status History     Date Active Date Inactive Code Status Order ID Comments User Context   05/25/2021 2115 05/30/2021 2024 Full Code [id number]  Orma Flaming, MD ED   05/13/2021 1446 05/16/2021 1821 Full Code [id number]  Norins, Heinz Knuckles, MD ED         IV Access:   Peripheral IV   Procedures and diagnostic studies:   No results found.   Medical Consultants:   None.   Subjective:    Erica Whitney relates her pain is better today.  Objective:    Vitals:   01/28/23 1458 01/28/23 1931 01/29/23 0430 01/29/23 0500  BP: (!) 107/52 (!) 122/59 (!) 145/61   Pulse: 71 74 79   Resp: 16  17   Temp: 98.5 F (36.9 C) 98.5 F (36.9 C) 98.5 F (36.9 C)   TempSrc: Oral Oral Oral   SpO2: 100% 95% 97%   Weight:    62.3 kg  Height:       SpO2: 97 % O2 Flow Rate (L/min): 2 L/min   Intake/Output Summary (Last 24 hours) at 01/29/2023 0837 Last data filed at 01/29/2023 0657 Gross per 24 hour  Intake 3346.55 ml  Output --  Net  3346.55 ml    Filed Weights   01/26/23 0222 01/28/23 0500 01/29/23 0500  Weight: 71 kg 66.4 kg 62.3 kg    Exam: General exam: In no acute distress. Respiratory system: Good air movement and clear to auscultation. Cardiovascular system: S1 & S2 heard, RRR. No JVD. Gastrointestinal system: Abdomen is nondistended, soft and nontender.  Skin: Erythema over the left side of her face is improved swelling is improved still tender to touch Psychiatry: Judgement and insight appear normal. Mood & affect appropriate.   Data Reviewed:    Labs: Basic Metabolic Panel: Recent Labs  Lab  01/26/23 0250 01/26/23 0853 01/27/23 0326 01/28/23 0248 01/29/23 0308  NA 139  --  138 137 139  K 3.4*  --  3.1* 3.6 3.4*  CL 101  --  102 103 107  CO2 22  --  23 22 21*  GLUCOSE 131*  --  122* 108* 101*  BUN 124*  --  109* 103* 95*  CREATININE 4.91* 4.60* 4.44* 4.28* 4.22*  CALCIUM 9.4  --  8.9 8.7* 8.4*  PHOS  --   --   --  2.9 2.6    GFR Estimated Creatinine Clearance: 8.8 mL/min (A) (by C-G formula based on SCr of 4.22 mg/dL (H)). Liver Function Tests: Recent Labs  Lab 01/26/23 0250 01/28/23 0248 01/29/23 0308  AST 11*  --   --   ALT 8  --   --   ALKPHOS 45  --   --   BILITOT 0.8  --   --   PROT 6.7  --   --   ALBUMIN 3.1* 2.3* 2.3*    No results for input(s): "LIPASE", "AMYLASE" in the last 168 hours. No results for input(s): "AMMONIA" in the last 168 hours. Coagulation profile Recent Labs  Lab 01/26/23 0250  INR 1.2    COVID-19 Labs  No results for input(s): "DDIMER", "FERRITIN", "LDH", "CRP" in the last 72 hours.   Lab Results  Component Value Date   SARSCOV2NAA NEGATIVE 05/25/2021   [place] NEGATIVE 05/13/2021    CBC: Recent Labs  Lab 01/24/23 0807 01/26/23 0250 01/26/23 0853 01/27/23 0326  WBC  --  13.0* 14.1* 14.0*  NEUTROABS  --  10.2*  --   --   HGB 10.5* 10.3* 10.2* 10.1*  HCT  --  32.3* 32.7* 31.0*  MCV  --  101.3* 102.2* 99.7  PLT  --  171 175 194    Cardiac Enzymes: No results for input(s): "CKTOTAL", "CKMB", "CKMBINDEX", "TROPONINI" in the last 168 hours. BNP (last 3 results) No results for input(s): "PROBNP" in the last 8760 hours. CBG: No results for input(s): "GLUCAP" in the last 168 hours. D-Dimer: No results for input(s): "DDIMER" in the last 72 hours. Hgb A1c: No results for input(s): "HGBA1C" in the last 72 hours.  Lipid Profile: No results for input(s): "CHOL", "HDL", "LDLCALC", "TRIG", "CHOLHDL", "LDLDIRECT" in the last 72 hours. Thyroid function studies: No results for input(s): "TSH", "T4TOTAL",  "T3FREE", "THYROIDAB" in the last 72 hours.  Invalid input(s): "FREET3" Anemia work up: No results for input(s): "VITAMINB12", "FOLATE", "FERRITIN", "TIBC", "IRON", "RETICCTPCT" in the last 72 hours.  Sepsis Labs: Recent Labs  Lab 01/26/23 0250 01/26/23 0556 01/26/23 0853 01/27/23 0326  WBC 13.0*  --  14.1* 14.0*  LATICACIDVEN 0.8 0.7  --   --     Microbiology Recent Results (from the past 240 hour(s))  Blood Culture (routine x 2)     Status: None (Preliminary result)  Collection Time: 01/26/23  3:00 AM   Specimen: BLOOD  Result Value Ref Range Status   Specimen Description BLOOD LEFT ANTECUBITAL  Final   Special Requests   Final    BOTTLES DRAWN AEROBIC AND ANAEROBIC Blood Culture adequate volume   Culture   Final    NO GROWTH 2 DAYS Performed at [facility], [address], [place], [place] [address]    Report Status PENDING  Incomplete  Blood Culture (routine x 2)     Status: None (Preliminary result)   Collection Time: 01/26/23  3:00 AM   Specimen: BLOOD LEFT WRIST  Result Value Ref Range Status   Specimen Description BLOOD LEFT WRIST  Final   Special Requests   Final    BOTTLES DRAWN AEROBIC AND ANAEROBIC Blood Culture results may not be optimal due to an inadequate volume of blood received in culture bottles   Culture   Final    NO GROWTH 2 DAYS Performed at [facility], [place] [address] [place], [place] [address]    Report Status PENDING  Incomplete     Medications:    amLODipine  5 mg Oral Daily   ascorbic acid  500 mg Oral Daily   atorvastatin  40 mg Oral Daily   calcium-vitamin D  1 tablet Oral Q breakfast   cholecalciferol  1,000 Units Oral Daily   enoxaparin (LOVENOX) injection  30 mg Subcutaneous Daily   ferrous sulfate  325 mg Oral Q breakfast   gabapentin  100 mg Oral BID   hydrALAZINE  50 mg Oral TID   metoprolol succinate  25 mg Oral Daily   mirtazapine  30 mg Oral QHS   pantoprazole  40 mg Oral Daily   sertraline  100 mg Oral  Daily   traZODone  50 mg Oral QHS   Continuous Infusions:  sodium chloride Stopped (01/29/23 0416)   clindamycin (CLEOCIN) IV 600 mg (01/29/23 0535)      LOS: 3 days   Charlynne Cousins  [facility]  01/29/2023, 8:37 AM

## 2023-01-29 NOTE — Care Management Important Message (Signed)
Important Message  Patient Details  Name: Erica Whitney MRN: [id number] Date of Birth: 28-Feb-1941   Medicare Important Message Given:  Yes     Orbie Pyo 01/29/2023, 3:08 PM

## 2023-01-29 NOTE — Progress Notes (Signed)
PT Cancellation Note  Patient Details Name: Erica Whitney MRN: [id number] DOB: 09/17/1941   Cancelled Treatment:    Reason Eval/Treat Not Completed: Other (comment).  Reports fatigue, asking to wait for therapy session.  Retry as time and pt allow.   Ramond Dial 01/29/2023, 4:19 PM  Mee Hives, PT PhD Acute Rehab Dept. Number: [place] and [place]

## 2023-01-30 DIAGNOSIS — N179 Acute kidney failure, unspecified: Secondary | ICD-10-CM | POA: Diagnosis not present

## 2023-01-30 DIAGNOSIS — L03221 Cellulitis of neck: Secondary | ICD-10-CM | POA: Diagnosis not present

## 2023-01-30 DIAGNOSIS — N184 Chronic kidney disease, stage 4 (severe): Secondary | ICD-10-CM | POA: Diagnosis not present

## 2023-01-30 MED ORDER — CLINDAMYCIN HCL 300 MG PO CAPS: 600.0000 mg | ORAL_CAPSULE | Freq: Four times a day (QID) | ORAL | Status: DC

## 2023-01-30 MED ORDER — CLINDAMYCIN HCL 300 MG PO CAPS
300.0000 mg | ORAL_CAPSULE | Freq: Four times a day (QID) | ORAL | Status: DC
Start: 2023-01-30 — End: 2023-01-31
  Administered 2023-01-30 – 2023-01-31 (×6): 300 mg via ORAL
  Filled 2023-01-30 (×9): qty 1

## 2023-01-30 NOTE — Progress Notes (Signed)
   01/30/23 1500  Mobility  Activity Ambulated with assistance to bathroom  Level of Assistance Standby assist, set-up cues, supervision of patient - no hands on  Assistive Device Front wheel walker  Distance Ambulated (ft) 20 ft  Activity Response Tolerated well  Mobility Referral Yes  $Mobility charge 1 Mobility   Mobility Specialist Progress Note  Pt requesting to use BR. Had no c/o pain. Returned to bed w/ all needs met and call bell in reach.  Lucious Groves Mobility Specialist  Please contact via SecureChat or Rehab office at [phone]

## 2023-01-30 NOTE — Progress Notes (Signed)
[facility] PROGRESS NOTE    Progress Note  Erica Whitney  [id number] DOB: 1941/07/21 DOA: 01/26/2023 PCP: Nolene Ebbs, MD     Brief Narrative:   Erica Whitney is an 82 y.o. female past medical history of breast cancer, chronic NEC stage IIIa, anemia of chronic disease, history of peptic ulcer borderline diabetes mellitus brought in by the daughter noticing right mandibular swelling and redness, according to the daughter she is only being tolerating liquids over the last 3 to 4 days noticed yesterday her face was swollen extending into the neck and patient appeared confused white count of 13,000 and was found to have facial cellulitis.    Assessment/Plan:   Right facial Cellulitis likely due to CSialodenitis ENT has been consulted, transition clindamycin to oral. She still relates is very painful continue Continue apply warm compresses. Has remained afebrile Blood cultures have been negative till date. She will need home PT.  Acute on chronic kidney disease stage IV: With a baseline creatinine of 3-4, on admission creatinine 4.9 likely prerenal azotemia in the setting of decreased oral intake. There is no indication for dialysis right now. Nephrology on board appreciate assistance.  Acute metabolic encephalopathy: Multifactorial likely due to infectious etiology, pharmacology as she takes Neurontin plus or minus elevated BUN. Mentation improved, Neurontin has been restarted.  Anemia of chronic renal disease: Hemoglobin stable continue to monitor.  Essential hypertension: Blood pressure is improved this morning will restart home regimen. IV hydralazine as needed.  Diabetes mellitus type 2 uncontrolled: Hemoglobin A1c of 4.4 which means her blood glucose actually well-controlled she is currently off any oral hypoglycemic agents at home there is probably due to to the deterioration of renal function.  Peptic ulcer disease: Continue PPI.  History of  breast cancer: Noted.   DVT prophylaxis: lovenox Family Communication:Daughter Status is: Inpatient Remains inpatient appropriate because: Right facial cellulitis    Code Status:     Code Status Orders  (From admission, onward)           Start     Ordered   01/26/23 0743  Full code  Continuous       Question:  By:  Answer:  Consent: discussion documented in EHR   01/26/23 0750           Code Status History     Date Active Date Inactive Code Status Order ID Comments User Context   05/25/2021 2115 05/30/2021 2024 Full Code [id number]  Orma Flaming, MD ED   05/13/2021 1446 05/16/2021 1821 Full Code [id number]  Norins, Heinz Knuckles, MD ED         IV Access:   Peripheral IV   Procedures and diagnostic studies:   No results found.   Medical Consultants:   None.   Subjective:    [name] relates still difficult to swallow it hurts her right side of the face  Objective:    Vitals:   01/29/23 1946 01/29/23 2226 01/30/23 0408 01/30/23 0745  BP: (!) 141/66 (!) 150/66 (!) 143/65 (!) 159/80  Pulse: 72  81 80  Resp: 15   18  Temp: 98.6 F (37 C)  98.9 F (37.2 C) 98.4 F (36.9 C)  TempSrc: Oral  Oral Oral  SpO2: 100%  95% 95%  Weight:      Height:       SpO2: 95 % O2 Flow Rate (L/min): 2 L/min   Intake/Output Summary (Last 24 hours) at 01/30/2023 [id number] Last data filed at 01/29/2023 1330  Gross per 24 hour  Intake 320 ml  Output 75 ml  Net 245 ml    Filed Weights   01/26/23 0222 01/28/23 0500 01/29/23 0500  Weight: 71 kg 66.4 kg 62.3 kg    Exam: General exam: In no acute distress. Respiratory system: Good air movement and clear to auscultation. Cardiovascular system: S1 & S2 heard, RRR. No JVD. Gastrointestinal system: Abdomen is nondistended, soft and nontender.  Skin: Left-sided parotid is still tender and hard but not erythematous or warm to touch Psychiatry: Judgement and insight appear normal. Mood & affect  appropriate.   Data Reviewed:    Labs: Basic Metabolic Panel: Recent Labs  Lab 01/26/23 0250 01/26/23 0853 01/27/23 0326 01/28/23 0248 01/29/23 0308  NA 139  --  138 137 139  K 3.4*  --  3.1* 3.6 3.4*  CL 101  --  102 103 107  CO2 22  --  23 22 21*  GLUCOSE 131*  --  122* 108* 101*  BUN 124*  --  109* 103* 95*  CREATININE 4.91* 4.60* 4.44* 4.28* 4.22*  CALCIUM 9.4  --  8.9 8.7* 8.4*  PHOS  --   --   --  2.9 2.6    GFR Estimated Creatinine Clearance: 8.8 mL/min (A) (by C-G formula based on SCr of 4.22 mg/dL (H)). Liver Function Tests: Recent Labs  Lab 01/26/23 0250 01/28/23 0248 01/29/23 0308  AST 11*  --   --   ALT 8  --   --   ALKPHOS 45  --   --   BILITOT 0.8  --   --   PROT 6.7  --   --   ALBUMIN 3.1* 2.3* 2.3*    No results for input(s): "LIPASE", "AMYLASE" in the last 168 hours. No results for input(s): "AMMONIA" in the last 168 hours. Coagulation profile Recent Labs  Lab 01/26/23 0250  INR 1.2    COVID-19 Labs  No results for input(s): "DDIMER", "FERRITIN", "LDH", "CRP" in the last 72 hours.   Lab Results  Component Value Date   SARSCOV2NAA NEGATIVE 05/25/2021   [place] NEGATIVE 05/13/2021    CBC: Recent Labs  Lab 01/24/23 0807 01/26/23 0250 01/26/23 0853 01/27/23 0326  WBC  --  13.0* 14.1* 14.0*  NEUTROABS  --  10.2*  --   --   HGB 10.5* 10.3* 10.2* 10.1*  HCT  --  32.3* 32.7* 31.0*  MCV  --  101.3* 102.2* 99.7  PLT  --  171 175 194    Cardiac Enzymes: No results for input(s): "CKTOTAL", "CKMB", "CKMBINDEX", "TROPONINI" in the last 168 hours. BNP (last 3 results) No results for input(s): "PROBNP" in the last 8760 hours. CBG: No results for input(s): "GLUCAP" in the last 168 hours. D-Dimer: No results for input(s): "DDIMER" in the last 72 hours. Hgb A1c: No results for input(s): "HGBA1C" in the last 72 hours.  Lipid Profile: No results for input(s): "CHOL", "HDL", "LDLCALC", "TRIG", "CHOLHDL", "LDLDIRECT" in the last  72 hours. Thyroid function studies: No results for input(s): "TSH", "T4TOTAL", "T3FREE", "THYROIDAB" in the last 72 hours.  Invalid input(s): "FREET3" Anemia work up: No results for input(s): "VITAMINB12", "FOLATE", "FERRITIN", "TIBC", "IRON", "RETICCTPCT" in the last 72 hours.  Sepsis Labs: Recent Labs  Lab 01/26/23 0250 01/26/23 0556 01/26/23 0853 01/27/23 0326  WBC 13.0*  --  14.1* 14.0*  LATICACIDVEN 0.8 0.7  --   --     Microbiology Recent Results (from the past 240 hour(s))  Blood Culture (routine  x 2)     Status: None (Preliminary result)   Collection Time: 01/26/23  3:00 AM   Specimen: BLOOD  Result Value Ref Range Status   Specimen Description BLOOD LEFT ANTECUBITAL  Final   Special Requests   Final    BOTTLES DRAWN AEROBIC AND ANAEROBIC Blood Culture adequate volume   Culture   Final    NO GROWTH 3 DAYS Performed at [facility], [address] [place], [place] [address]    Report Status PENDING  Incomplete  Blood Culture (routine x 2)     Status: None (Preliminary result)   Collection Time: 01/26/23  3:00 AM   Specimen: BLOOD LEFT WRIST  Result Value Ref Range Status   Specimen Description BLOOD LEFT WRIST  Final   Special Requests   Final    BOTTLES DRAWN AEROBIC AND ANAEROBIC Blood Culture results may not be optimal due to an inadequate volume of blood received in culture bottles   Culture   Final    NO GROWTH 3 DAYS Performed at [facility], [place] [address], [place], [place] [address]    Report Status PENDING  Incomplete     Medications:    amLODipine  5 mg Oral Daily   ascorbic acid  500 mg Oral Daily   atorvastatin  40 mg Oral Daily   calcium-vitamin D  1 tablet Oral Q breakfast   cholecalciferol  1,000 Units Oral Daily   enoxaparin (LOVENOX) injection  30 mg Subcutaneous Daily   ferrous sulfate  325 mg Oral Q breakfast   gabapentin  100 mg Oral BID   hydrALAZINE  50 mg Oral TID   metoprolol succinate  25 mg Oral Daily    mirtazapine  30 mg Oral QHS   pantoprazole  40 mg Oral Daily   sertraline  100 mg Oral Daily   traZODone  50 mg Oral QHS   Continuous Infusions:  clindamycin (CLEOCIN) IV 600 mg (01/30/23 0612)      LOS: 4 days   Charlynne Cousins  [facility]  01/30/2023, 8:07 AM

## 2023-01-30 NOTE — Progress Notes (Signed)
PT Cancellation Note  Patient Details Name: Erica Whitney MRN: [id number] DOB: 07/26/1941   Cancelled Treatment:    Reason Eval/Treat Not Completed: (P) Other (comment) (pt sitting EOB to eat dinner with family member present.) Pt worked with mobility specialist for hallway ambulation earlier in the day. Will continue efforts per PT plan of care as schedule permits.   Kara Pacer Zarie Kosiba 01/30/2023, 5:31 PM

## 2023-01-30 NOTE — Progress Notes (Signed)
   01/30/23 1000  Mobility  Activity Ambulated with assistance in hallway  Level of Assistance Contact guard assist, steadying assist  Assistive Device Front wheel walker  Distance Ambulated (ft) 300 ft  Activity Response Tolerated well  Mobility Referral Yes  $Mobility charge 1 Mobility   Mobility Specialist Progress Note  Pt in bed and agreeable. Had no c/o pain. Returned to [facility] for BM w/ instructions to pull string when finished. RN notified.   Lucious Groves Mobility Specialist  Please contact via SecureChat or Rehab office at [phone]

## 2023-01-31 DIAGNOSIS — L03221 Cellulitis of neck: Secondary | ICD-10-CM | POA: Diagnosis not present

## 2023-01-31 DIAGNOSIS — N184 Chronic kidney disease, stage 4 (severe): Secondary | ICD-10-CM | POA: Diagnosis not present

## 2023-01-31 DIAGNOSIS — N179 Acute kidney failure, unspecified: Secondary | ICD-10-CM | POA: Diagnosis not present

## 2023-01-31 DIAGNOSIS — D631 Anemia in chronic kidney disease: Secondary | ICD-10-CM

## 2023-01-31 DIAGNOSIS — L03211 Cellulitis of face: Secondary | ICD-10-CM | POA: Diagnosis not present

## 2023-01-31 LAB — CULTURE, BLOOD (ROUTINE X 2)
Culture: NO GROWTH
Culture: NO GROWTH
Special Requests: ADEQUATE

## 2023-01-31 MED ORDER — ALUM & MAG HYDROXIDE-SIMETH 200-200-20 MG/5ML PO SUSP
30.0000 mL | ORAL | Status: DC | PRN
  Administered 2023-01-31: 30 mL via ORAL
  Filled 2023-01-31: qty 30

## 2023-01-31 MED ORDER — SACCHAROMYCES BOULARDII 250 MG PO CAPS: 250.0000 mg | ORAL_CAPSULE | Freq: Two times a day (BID) | ORAL | 0 refills | Status: DC

## 2023-01-31 MED ORDER — CLINDAMYCIN HCL 300 MG PO CAPS: 300.0000 mg | ORAL_CAPSULE | Freq: Four times a day (QID) | ORAL | 0 refills | Status: AC

## 2023-01-31 NOTE — Progress Notes (Signed)
   01/31/23 1626  Mobility  Activity Ambulated with assistance in hallway  Level of Assistance Standby assist, set-up cues, supervision of patient - no hands on  Assistive Device Front wheel walker  Distance Ambulated (ft) 300 ft  Activity Response Tolerated well  Mobility Referral Yes  $Mobility charge 1 Mobility   Mobility Specialist Progress Note  Pt in bed and agreeable. Had no c/o pain. Returned to bed w/ all needs met and call bell in reach.   Lucious Groves Mobility Specialist  Please contact via SecureChat or Rehab office at [phone]

## 2023-01-31 NOTE — Progress Notes (Signed)
Physical Therapy Treatment Patient Details Name: Erica Whitney MRN: [id number] DOB: 1941-10-16 Today's Date: 01/31/2023   History of Present Illness Pt is a 82 y.o. F who presents 01/26/2023 with right facial cellulitis and acute on chronic kidney disease stage IV. Significant PMH: breast cancer, chronic NEC stage IIIa, anemia of chronic disease, history of peptic ulcer borderline diabetes mellitus    PT Comments    Pt was received in supine and agreeable to therapy. Pt reported her sheets were wet and was able to perform all bed mobility with supervision for hygiene. Pt demonstrated good initiation of mobility tasks this session. Pt was able to increase gait distance with cues for proximity to walker and increased foot clearance. Pt reported feeling gas in stomach that is causing discomfort and affecting her appetite. Pt continues to benefit from PT services to progress toward functional mobility goals.      Recommendations for follow up therapy are one component of a multi-disciplinary discharge planning process, led by the attending physician.  Recommendations may be updated based on patient status, additional functional criteria and insurance authorization.  Follow Up Recommendations  Home health PT     Assistance Recommended at Discharge PRN  Patient can return home with the following A little help with walking and/or transfers;A little help with bathing/dressing/bathroom;Assistance with cooking/housework;Assist for transportation;Help with stairs or ramp for entrance   Equipment Recommendations  None recommended by PT    Recommendations for Other Services       Precautions / Restrictions Precautions Precautions: Fall Restrictions Weight Bearing Restrictions: No     Mobility  Bed Mobility Overal bed mobility: Needs Assistance Bed Mobility: Supine to Sit, Rolling Rolling: Supervision   Supine to sit: Supervision     General bed mobility comments: Pt able to lift up  in bridge position and in sidelying for hygiene    Transfers Overall transfer level: Needs assistance Equipment used: None Transfers: Sit to/from Stand Sit to Stand: Supervision           General transfer comment: Pt stood before RW was in front of her without LOB or unsteadiness    Ambulation/Gait Ambulation/Gait assistance: Supervision Gait Distance (Feet): 330 Feet Assistive device: Rolling walker (2 wheels) Gait Pattern/deviations: Step-through pattern, Decreased stride length, Shuffle Gait velocity: decreased     General Gait Details: Cues throughout for proximity to RW. Pt demonstrating shuffling steps with decreased foot clearance, but pt able to correct with cues.      Balance Overall balance assessment: Mild deficits observed, not formally tested                                          Cognition Arousal/Alertness: Awake/alert Behavior During Therapy: WFL for tasks assessed/performed Overall Cognitive Status: No family/caregiver present to determine baseline cognitive functioning                                                 Pertinent Vitals/Pain Pain Assessment Pain Assessment: No/denies pain     PT Goals (current goals can now be found in the care plan section) Acute Rehab PT Goals Patient Stated Goal: did not state PT Goal Formulation: With patient Time For Goal Achievement: 02/10/23 Potential to Achieve Goals: Good Progress towards PT goals: Progressing  toward goals    Frequency    Min 3X/week      PT Plan Current plan remains appropriate       AM-PAC PT "6 Clicks" Mobility   Outcome Measure  Help needed turning from your back to your side while in a flat bed without using bedrails?: A Little Help needed moving from lying on your back to sitting on the side of a flat bed without using bedrails?: A Little Help needed moving to and from a bed to a chair (including a wheelchair)?: A Little Help  needed standing up from a chair using your arms (e.g., wheelchair or bedside chair)?: A Little Help needed to walk in hospital room?: A Little Help needed climbing 3-5 steps with a railing? : A Little 6 Click Score: 18    End of Session Equipment Utilized During Treatment: Gait belt Activity Tolerance: Patient tolerated treatment well Patient left: in chair;with call bell/phone within reach;with chair alarm set Nurse Communication: Mobility status PT Visit Diagnosis: Unsteadiness on feet (R26.81);Difficulty in walking, not elsewhere classified (R26.2)     Time: [id number] PT Time Calculation (min) (ACUTE ONLY): 42 min  Charges:  $Gait Training: 23-37 mins $Therapeutic Activity: 8-22 mins                     Michelle Nasuti, PTA Acute Rehabilitation Services Secure Chat Preferred  [phone]    Michelle Nasuti 01/31/2023, 9:37 AM

## 2023-01-31 NOTE — TOC Transition Note (Signed)
Transition of Care [facility]) - CM/SW Discharge Note   Patient Details  Name: Erica Whitney MRN: [id number] Date of Birth: [birth year]  Transition of Care [facility]) CM/SW Contact:  Carles Collet, RN Phone Number: 01/31/2023, 10:08 AM   Clinical Narrative:     Kaylyn Layer [place] liaison that patient will DC today. No DME needs No other TOC needs identified for DC at this time.   Final next level of care: [place] Barriers to Discharge: No Barriers Identified   Patient Goals and CMS Choice   Choice offered to / list presented to : Patient  Discharge Placement                         Discharge Plan and Services Additional resources added to the After Visit Summary for     Discharge Planning Services: CM Consult Post Acute Care Choice: Home Health          DME Arranged: N/A DME Agency: NA       HH Arranged: PT, OT HH Agency: [place] Date [facility] Agency Contacted: 01/31/23 Time [place]: [phone] Representative spoke with at [place]: [place] Determinants of Health ([place]) Interventions SDOH Screenings   Food Insecurity: No Food Insecurity (01/26/2023)  Housing: Low Risk  (01/26/2023)  Transportation Needs: No Transportation Needs (01/26/2023)  Utilities: Not At Risk (01/26/2023)  Tobacco Use: Medium Risk (01/26/2023)     Readmission Risk Interventions     No data to display

## 2023-01-31 NOTE — Progress Notes (Signed)
Attempted to call patient's daughter. Left VM requesting return call.

## 2023-01-31 NOTE — Progress Notes (Signed)
   01/31/23 1022  Mobility  Activity Ambulated with assistance to bathroom  Level of Assistance Standby assist, set-up cues, supervision of patient - no hands on  Assistive Device Front wheel walker  Distance Ambulated (ft) 20 ft  Activity Response Tolerated well  Mobility Referral Yes  $Mobility charge 1 Mobility   Mobility Specialist Progress Note  Pt requesting to use BR. Had no c/o pain. Returned to bed w/ all needs met and call bell in reach.  Lucious Groves Mobility Specialist  Please contact via SecureChat or Rehab office at [phone]

## 2023-01-31 NOTE — Progress Notes (Signed)
Patient's daughter will pick her up when she gets off work at 1700.

## 2023-01-31 NOTE — Discharge Summary (Addendum)
Physician Discharge Summary  Erica Whitney [id number] DOB: 11-17-1941 DOA: 01/26/2023  PCP: Nolene Ebbs, MD  Admit date: 01/26/2023 Discharge date: 01/31/2023  Admitted From: Home Disposition:  Home  Recommendations for Outpatient Follow-up:  Follow up withENT in 1-2 weeks Please obtain BMP/CBC in one week   Home Health:No Equipment/Devices:None  Discharge Condition:Stable CODE STATUS:Full Diet recommendation: Heart Healthy   Brief/Interim Summary:  82 y.o. female past medical history of breast cancer, chronic NEC stage IIIa, anemia of chronic disease, history of peptic ulcer borderline diabetes mellitus brought in by the daughter noticing right mandibular swelling and redness, according to the daughter she is only being tolerating liquids over the last 3 to 4 days noticed yesterday her face was swollen extending into the neck and patient appeared confused white count of 13,000 and was found to have facial cellulitis.     Discharge Diagnoses:  Principal Problem:   Cellulitis Active Problems:   HTN (hypertension)   DM2 (diabetes mellitus, type 2) (HCC)   Breast cancer of upper-outer quadrant of right female breast ([place])   Osteopenia   PUD (peptic ulcer disease)   Anemia in chronic kidney disease (CKD)   Acute metabolic encephalopathy   AKI (acute kidney injury) ([place])   CKD (chronic kidney disease) stage 4, GFR 15-29 ml/min (HCC)  Right facial cellulitis likely due to cellulitis: ENT was consulted they agree with IV steroids and IV hydration. She had to be on high-dose narcotics and warm compresses and after 5 days her pain improved. Her cultures remain negative. She will continue clindamycin as an outpatient. PT evaluated the patient recommended home health.  Acute kidney injury on chronic kidney stage IV: With a baseline creatinine of 3-4 on admission 5 likely prerenal in the setting of decreased oral intake. She was hydrated now resolved.  Acute metabolic  encephalopathy: Multifactorial likely due to infectious etiology her mentation is significantly improved on discharge.  Anemia of chronic renal disease: Her hemoglobin remained stable.  Essential hypertension: No changes made to her medication.  Diabetes mellitus type 2 controlled: With an A1c of 4.4 required minimal insulin.  Peptic ulcer disease: Continue PPI.  History of breast cancer: Noted.  Discharge Instructions  Discharge Instructions     Diet - low sodium heart healthy   Complete by: As directed    Increase activity slowly   Complete by: As directed       Allergies as of 01/31/2023   No Known Allergies      Medication List     TAKE these medications    acetaminophen 650 MG CR tablet Commonly known as: TYLENOL Take 1,300 mg by mouth as needed for pain.   amLODipine 10 MG tablet Commonly known as: NORVASC Take 1 tablet (10 mg total) by mouth daily.   atorvastatin 40 MG tablet Commonly known as: LIPITOR Take 40 mg by mouth daily.   calcitRIOL 0.25 MCG capsule Commonly known as: ROCALTROL Take 0.25 mcg by mouth 3 (three) times a week. MWF   clindamycin 300 MG capsule Commonly known as: CLEOCIN Take 1 capsule (300 mg total) by mouth every 6 (six) hours for 7 days.   cycloSPORINE 0.05 % ophthalmic emulsion Commonly known as: RESTASIS Place 1 drop into both eyes 2 (two) times daily.   ferrous sulfate 325 (65 FE) MG tablet Take 1 tablet (325 mg total) by mouth daily with breakfast.   furosemide 40 MG tablet Commonly known as: LASIX Take 1 tablet (40 mg total) by mouth daily as needed  for fluid (ankle swelling). What changed: when to take this   gabapentin 100 MG capsule Commonly known as: NEURONTIN Take 100 mg by mouth 2 (two) times daily.   hydrALAZINE 100 MG tablet Commonly known as: APRESOLINE Take 100 mg by mouth 3 (three) times daily. What changed: Another medication with the same name was removed. Continue taking this medication, and  follow the directions you see here.   metoprolol succinate 25 MG 24 hr tablet Commonly known as: TOPROL-XL Take 25 mg by mouth daily.   mirtazapine 30 MG tablet Commonly known as: REMERON Take 30 mg by mouth at bedtime.   pantoprazole 40 MG tablet Commonly known as: PROTONIX Take 1 tablet (40 mg total) by mouth 2 (two) times daily before a meal for 30 days, THEN 1 tablet (40 mg total) daily. Start taking on: May 16, 2021 What changed: See the new instructions.   saccharomyces boulardii 250 MG capsule Commonly known as: Florastor Take 1 capsule (250 mg total) by mouth 2 (two) times daily.   SALONPAS EX Apply 1 patch topically as needed (pain).   senna-docusate 8.6-50 MG tablet Commonly known as: Senokot-S Take 1 tablet by mouth at bedtime as needed for mild constipation or moderate constipation.   sertraline 100 MG tablet Commonly known as: ZOLOFT Take 100 mg by mouth every morning.   traZODone 50 MG tablet Commonly known as: DESYREL Take 50 mg by mouth at bedtime.        Follow-up Information     [facility], [facility] Follow up.   Specialty: Home Health Services Contact information: [address] [place] [address] [phone]                No Known Allergies  Consultations: Nephrology   Procedures/Studies: US RENAL  Result Date: 01/26/2023 CLINICAL DATA:  Acute kidney injury. EXAM: RENAL / URINARY TRACT ULTRASOUND COMPLETE COMPARISON:  Renal ultrasound 04/19/2021 FINDINGS: Right Kidney: Renal measurements: 7.4 x 3.4 x 4.6 cm = volume: 61 mL. Echogenicity within normal limits. No mass or hydronephrosis visualized. Left Kidney: Renal measurements: 9.6 x 4.0 x 6.3 cm = volume: 127 mL. Increased parenchymal echogenicity. No solid mass or hydronephrosis. Unchanged 3.2 cm simple appearing cyst for which no follow-up imaging is recommended. Bladder: Appears normal for degree of bladder distention. Other: None. IMPRESSION: 1. No  hydronephrosis. 2. Chronic, asymmetrically severe right renal atrophy. Electronically Signed   By: Logan Bores M.D.   On: 01/26/2023 10:34   CT Maxillofacial Wo Contrast  Result Date: 01/26/2023 CLINICAL DATA:  82 year old female with delirium, redness and swelling at the right upper jaw, neck soft tissue swelling and induration with pain. The patient has baseline renal insufficiency (baseline creatinine of about 3) and further elevated creatinine in now to 5. IV contrast was deferred. EXAM: CT NECK WITHOUT CONTRAST CT MAXILLOFACIAL WITHOUT CONTRAST TECHNIQUE: Multidetector CT imaging of the neck, face is most was performed following the standard protocol without intravenous contrast. RADIATION DOSE REDUCTION: This exam was performed according to the departmental dose-optimization program which includes automated exposure control, adjustment of the mA and/or kV according to patient size and/or use of iterative reconstruction technique. COMPARISON:  Head CT today reported separately. FINDINGS: Pharynx and larynx: Inflammation throughout the right parapharyngeal space, least affecting the level of the nasopharynx. Asymmetry of the pharynx and supraglottic larynx with evidence of swollen right lateral pharyngeal wall. But no obvious tonsillar enlargement. Epiglottis remains within normal limits. Fluid and/or inflammation throughout the retropharyngeal space, most  apparent from the C2 to the C4 cervical level as seen on sagittal image 46 of neck series 5. This is contiguous with both carotid spaces. The left parapharyngeal space is relatively normal. Salivary glands: Noncontrast sublingual space seems to remain within normal limits. Diffuse soft tissue swelling and inflammation throughout the right submandibular space, right submandibular gland is largely obscured. And subcutaneous soft tissue inflammation tracks across midline to the left at the level of the hyoid and submental soft tissues, and the left  submandibular space and gland also seems somewhat inflamed. Diffusely enlarged and inflamed right parotid gland. Widespread regional subcutaneous inflammation, thickening of the platysma, and inflammation in the regional right neck lymph node stations. Contralateral left parotid gland appears within normal limits. No sialolithiasis identified. No dilatation of the right parotid duct is evident. Thyroid: Heterogeneous.  No significant regional mass effect. Lymph nodes: Inflammation throughout the right neck lymph node stations and small but reactive appearing right level 1, level 2, and level 3 lymph nodes. Evaluation for cystic or necrotic node limited in the of the lack of IV contrast. Contralateral left cervical lymph node stations remain within normal limits. Vascular: Vascular patency is not evaluated in the absence of IV contrast. And there is a conspicuous appearance of the noncontrast right internal jugular vein in the lower neck on coronal image 54 of neck series 6. Superimposed bulky calcified right carotid bifurcation atherosclerosis. Limited intracranial: Head CT is reported separately. Visualized orbits: See Head CT reported separately. Mastoids and visualized paranasal sinuses: Best demonstrated on the face CT, the bilateral tympanic cavities and mastoids are clear. Paranasal sinuses are well aerated with of left sphenoid sinus 16 mm mucous retention cyst and otherwise only mild scattered paranasal sinus mucosal thickening. Nasal cavity demonstrates symmetric mucosal thickening. Skeleton: Best demonstrated on the CT face, much of the posterior mandible and maxillary dentition is absent. The residual mostly anterior maxillary and mandible dentition is diffusely carious and poor. However, periapical lucency more affects the maxilla than the mandible, and none of these seem to be the epicenter of inflammation described above. Mandible overall intact and aligned. Superimposed advanced cervical spine  degeneration. No cervical endplate erosion is evident. No acute or suspicious osseous lesion identified. Upper chest: Calcified aortic atherosclerosis. No definite superior mediastinal inflammation. Lung apices are relatively clear with subtle centrilobular emphysema. Other findings: Right periauricular soft tissue inflammation in association with the right parotid gland findings. But the right pinna does not appear significantly swollen. The superficial portion of the right external auditory canal is inflamed and thickened, but the EAC remains patent. IMPRESSION: 1. Marked inflammatory process throughout the right neck, extending from the superficial subcutaneous tissues into the deep spaces including the right pharynx, parapharyngeal and retropharyngeal spaces. Etiology indeterminate on this noncontrast exam and differential considerations fairly broad, including Severe right Sialadenitis, complicated Pharyngitis, complicated Cellulitis, Retropharyngeal Infection, acute Right Internal Jugular Vein Thrombosis. Recommend a follow-up Neck CT with IV contrast when feasible. Failing that, a noncontrast Neck MRI might allow narrowing of the differential diagnosis. 2. Pertinent negatives include normal right middle ear and mastoids, no significant paranasal sinus inflammation, no obvious osteomyelitis, visible lung apices clear and poor dentition although seems not directly related to the right neck inflammation. 3. Salient findings were discussed by telephone with Erenest Rasher, PA on 01/26/2023 at 0440 hours Electronically Signed   By: Genevie Ann M.D.   On: 01/26/2023 04:58   CT Soft Tissue Neck Wo Contrast  Result Date: 01/26/2023 CLINICAL DATA:  82 year old female with delirium, redness and swelling at the right upper jaw, neck soft tissue swelling and induration with pain. The patient has baseline renal insufficiency (baseline creatinine of about 3) and further elevated creatinine in now to 5. IV contrast  was deferred. EXAM: CT NECK WITHOUT CONTRAST CT MAXILLOFACIAL WITHOUT CONTRAST TECHNIQUE: Multidetector CT imaging of the neck, face is most was performed following the standard protocol without intravenous contrast. RADIATION DOSE REDUCTION: This exam was performed according to the departmental dose-optimization program which includes automated exposure control, adjustment of the mA and/or kV according to patient size and/or use of iterative reconstruction technique. COMPARISON:  Head CT today reported separately. FINDINGS: Pharynx and larynx: Inflammation throughout the right parapharyngeal space, least affecting the level of the nasopharynx. Asymmetry of the pharynx and supraglottic larynx with evidence of swollen right lateral pharyngeal wall. But no obvious tonsillar enlargement. Epiglottis remains within normal limits. Fluid and/or inflammation throughout the retropharyngeal space, most apparent from the C2 to the C4 cervical level as seen on sagittal image 46 of neck series 5. This is contiguous with both carotid spaces. The left parapharyngeal space is relatively normal. Salivary glands: Noncontrast sublingual space seems to remain within normal limits. Diffuse soft tissue swelling and inflammation throughout the right submandibular space, right submandibular gland is largely obscured. And subcutaneous soft tissue inflammation tracks across midline to the left at the level of the hyoid and submental soft tissues, and the left submandibular space and gland also seems somewhat inflamed. Diffusely enlarged and inflamed right parotid gland. Widespread regional subcutaneous inflammation, thickening of the platysma, and inflammation in the regional right neck lymph node stations. Contralateral left parotid gland appears within normal limits. No sialolithiasis identified. No dilatation of the right parotid duct is evident. Thyroid: Heterogeneous.  No significant regional mass effect. Lymph nodes: Inflammation  throughout the right neck lymph node stations and small but reactive appearing right level 1, level 2, and level 3 lymph nodes. Evaluation for cystic or necrotic node limited in the of the lack of IV contrast. Contralateral left cervical lymph node stations remain within normal limits. Vascular: Vascular patency is not evaluated in the absence of IV contrast. And there is a conspicuous appearance of the noncontrast right internal jugular vein in the lower neck on coronal image 54 of neck series 6. Superimposed bulky calcified right carotid bifurcation atherosclerosis. Limited intracranial: Head CT is reported separately. Visualized orbits: See Head CT reported separately. Mastoids and visualized paranasal sinuses: Best demonstrated on the face CT, the bilateral tympanic cavities and mastoids are clear. Paranasal sinuses are well aerated with of left sphenoid sinus 16 mm mucous retention cyst and otherwise only mild scattered paranasal sinus mucosal thickening. Nasal cavity demonstrates symmetric mucosal thickening. Skeleton: Best demonstrated on the CT face, much of the posterior mandible and maxillary dentition is absent. The residual mostly anterior maxillary and mandible dentition is diffusely carious and poor. However, periapical lucency more affects the maxilla than the mandible, and none of these seem to be the epicenter of inflammation described above. Mandible overall intact and aligned. Superimposed advanced cervical spine degeneration. No cervical endplate erosion is evident. No acute or suspicious osseous lesion identified. Upper chest: Calcified aortic atherosclerosis. No definite superior mediastinal inflammation. Lung apices are relatively clear with subtle centrilobular emphysema. Other findings: Right periauricular soft tissue inflammation in association with the right parotid gland findings. But the right pinna does not appear significantly swollen. The superficial portion of the right external  auditory canal is inflamed and thickened,  but the EAC remains patent. IMPRESSION: 1. Marked inflammatory process throughout the right neck, extending from the superficial subcutaneous tissues into the deep spaces including the right pharynx, parapharyngeal and retropharyngeal spaces. Etiology indeterminate on this noncontrast exam and differential considerations fairly broad, including Severe right Sialadenitis, complicated Pharyngitis, complicated Cellulitis, Retropharyngeal Infection, acute Right Internal Jugular Vein Thrombosis. Recommend a follow-up Neck CT with IV contrast when feasible. Failing that, a noncontrast Neck MRI might allow narrowing of the differential diagnosis. 2. Pertinent negatives include normal right middle ear and mastoids, no significant paranasal sinus inflammation, no obvious osteomyelitis, visible lung apices clear and poor dentition although seems not directly related to the right neck inflammation. 3. Salient findings were discussed by telephone with Erenest Rasher, PA on 01/26/2023 at 0440 hours Electronically Signed   By: Genevie Ann M.D.   On: 01/26/2023 04:58   CT Head Wo Contrast  Result Date: 01/26/2023 CLINICAL DATA:  82 year old female with delirium, redness and swelling at the right upper jaw, neck soft tissue swelling and induration with pain. EXAM: CT HEAD WITHOUT CONTRAST TECHNIQUE: Contiguous axial images were obtained from the base of the skull through the vertex without intravenous contrast. RADIATION DOSE REDUCTION: This exam was performed according to the departmental dose-optimization program which includes automated exposure control, adjustment of the mA and/or kV according to patient size and/or use of iterative reconstruction technique. COMPARISON:  CT face and neck today reported separately. Head CT 09/02/2007. FINDINGS: Brain: Cerebral volume loss since 2008 appears fairly generalized. No midline shift, ventriculomegaly, mass effect, evidence of mass  lesion, intracranial hemorrhage or evidence of cortically based acute infarction. Mild to moderate patchy bilateral cerebral white matter hypodensity is new or progressed since 2008. And there is heterogeneity in the right thalamus compatible with a small age indeterminate lacunar infarct there. No cortical encephalomalacia identified. Vascular: Calcified atherosclerosis at the skull base. No suspicious intracranial vascular hyperdensity. Skull: Osteopenia. Motion artifact at the skull base. No acute osseous abnormality identified. Sinuses/Orbits: Paranasal sinuses are well aerated. There is a left sphenoid mucous retention cysts. Tympanic cavities and mastoids appear clear. Other: Calcified scalp vessel atherosclerosis. Partially visible widespread subcutaneous inflammation in the region of the right ear, tracking inferiorly. See comparison neck and face CTs. Orbits soft tissues appear negative. IMPRESSION: 1. Progressed small vessel disease since 2008, including in the right thalamus which is age indeterminate. 2. No other acute intracranial abnormality identified. 3. Partially visible widespread superficial inflammation surrounding the right ear. But right middle ear and mastoids remain clear. See comparison Neck and Face CT reported separately. Electronically Signed   By: Genevie Ann M.D.   On: 01/26/2023 04:35   DG Chest Port 1 View  Result Date: 01/26/2023 CLINICAL DATA:  Questionable sepsis EXAM: PORTABLE CHEST 1 VIEW COMPARISON:  12/11/2008 FINDINGS: Heart and mediastinal contours are within normal limits. No focal opacities or effusions. No acute bony abnormality. Aortic atherosclerosis. IMPRESSION: No active disease. Electronically Signed   By: Rolm Baptise M.D.   On: 01/26/2023 02:51   (Echo, Carotid, EGD, Colonoscopy, ERCP)    Subjective: No complaints  Discharge Exam: Vitals:   01/31/23 0529 01/31/23 0816  BP: (!) 145/61 (!) 163/79  Pulse: 73 78  Resp: 18 16  Temp: 98.4 F (36.9 C) 99 F  (37.2 C)  SpO2: 95% 96%   Vitals:   01/30/23 2045 01/31/23 0500 01/31/23 0529 01/31/23 0816  BP: (!) 142/57  (!) 145/61 (!) 163/79  Pulse: 71  73 78  Resp: 17  18 16  Temp: 98.5 F (36.9 C)  98.4 F (36.9 C) 99 F (37.2 C)  TempSrc: Oral  Oral Oral  SpO2: 96%  95% 96%  Weight:  66.4 kg    Height:        General: Pt is alert, awake, not in acute distress Cardiovascular: RRR, S1/S2 +, no rubs, no gallops Respiratory: CTA bilaterally, no wheezing, no rhonchi Abdominal: Soft, NT, ND, bowel sounds + Extremities: no edema, no cyanosis    The results of significant diagnostics from this hospitalization (including imaging, microbiology, ancillary and laboratory) are listed below for reference.     Microbiology: Recent Results (from the past 240 hour(s))  Blood Culture (routine x 2)     Status: None   Collection Time: 01/26/23  3:00 AM   Specimen: BLOOD  Result Value Ref Range Status   Specimen Description BLOOD LEFT ANTECUBITAL  Final   Special Requests   Final    BOTTLES DRAWN AEROBIC AND ANAEROBIC Blood Culture adequate volume   Culture   Final    NO GROWTH 5 DAYS Performed at [facility], [address], [place], [place] [address]    Report Status 01/31/2023 FINAL  Final  Blood Culture (routine x 2)     Status: None   Collection Time: 01/26/23  3:00 AM   Specimen: BLOOD LEFT WRIST  Result Value Ref Range Status   Specimen Description BLOOD LEFT WRIST  Final   Special Requests   Final    BOTTLES DRAWN AEROBIC AND ANAEROBIC Blood Culture results may not be optimal due to an inadequate volume of blood received in culture bottles   Culture   Final    NO GROWTH 5 DAYS Performed at [facility], [place] [address] [place], [place] [address]    Report Status 01/31/2023 FINAL  Final     Labs: BNP (last 3 results) No results for input(s): "BNP" in the last 8760 hours. Basic Metabolic Panel: Recent Labs  Lab 01/26/23 0250 01/26/23 0853 01/27/23 0326  01/28/23 0248 01/29/23 0308  NA 139  --  138 137 139  K 3.4*  --  3.1* 3.6 3.4*  CL 101  --  102 103 107  CO2 22  --  23 22 21*  GLUCOSE 131*  --  122* 108* 101*  BUN 124*  --  109* 103* 95*  CREATININE 4.91* 4.60* 4.44* 4.28* 4.22*  CALCIUM 9.4  --  8.9 8.7* 8.4*  PHOS  --   --   --  2.9 2.6   Liver Function Tests: Recent Labs  Lab 01/26/23 0250 01/28/23 0248 01/29/23 0308  AST 11*  --   --   ALT 8  --   --   ALKPHOS 45  --   --   BILITOT 0.8  --   --   PROT 6.7  --   --   ALBUMIN 3.1* 2.3* 2.3*   No results for input(s): "LIPASE", "AMYLASE" in the last 168 hours. No results for input(s): "AMMONIA" in the last 168 hours. CBC: Recent Labs  Lab 01/26/23 0250 01/26/23 0853 01/27/23 0326  WBC 13.0* 14.1* 14.0*  NEUTROABS 10.2*  --   --   HGB 10.3* 10.2* 10.1*  HCT 32.3* 32.7* 31.0*  MCV 101.3* 102.2* 99.7  PLT 171 175 194   Cardiac Enzymes: No results for input(s): "CKTOTAL", "CKMB", "CKMBINDEX", "TROPONINI" in the last 168 hours. BNP: Invalid input(s): "POCBNP" CBG: No results for input(s): "GLUCAP" in the last  168 hours. D-Dimer No results for input(s): "DDIMER" in the last 72 hours. Hgb A1c No results for input(s): "HGBA1C" in the last 72 hours. Lipid Profile No results for input(s): "CHOL", "HDL", "LDLCALC", "TRIG", "CHOLHDL", "LDLDIRECT" in the last 72 hours. Thyroid function studies No results for input(s): "TSH", "T4TOTAL", "T3FREE", "THYROIDAB" in the last 72 hours.  Invalid input(s): "FREET3" Anemia work up No results for input(s): "VITAMINB12", "FOLATE", "FERRITIN", "TIBC", "IRON", "RETICCTPCT" in the last 72 hours. Urinalysis    Component Value Date/Time   COLORURINE YELLOW 01/26/2023 0815   APPEARANCEUR CLEAR 01/26/2023 0815   LABSPEC 1.011 01/26/2023 0815   PHURINE 5.0 01/26/2023 0815   GLUCOSEU NEGATIVE 01/26/2023 0815   HGBUR NEGATIVE 01/26/2023 0815   BILIRUBINUR NEGATIVE 01/26/2023 0815   KETONESUR NEGATIVE 01/26/2023 0815    PROTEINUR 100 (A) 01/26/2023 0815   UROBILINOGEN 0.2 01/30/2012 1215   NITRITE NEGATIVE 01/26/2023 0815   LEUKOCYTESUR NEGATIVE 01/26/2023 0815   Sepsis Labs Recent Labs  Lab 01/26/23 0250 01/26/23 0853 01/27/23 0326  WBC 13.0* 14.1* 14.0*   Microbiology Recent Results (from the past 240 hour(s))  Blood Culture (routine x 2)     Status: None   Collection Time: 01/26/23  3:00 AM   Specimen: BLOOD  Result Value Ref Range Status   Specimen Description BLOOD LEFT ANTECUBITAL  Final   Special Requests   Final    BOTTLES DRAWN AEROBIC AND ANAEROBIC Blood Culture adequate volume   Culture   Final    NO GROWTH 5 DAYS Performed at [facility], [address], [place], [place] [address]    Report Status 01/31/2023 FINAL  Final  Blood Culture (routine x 2)     Status: None   Collection Time: 01/26/23  3:00 AM   Specimen: BLOOD LEFT WRIST  Result Value Ref Range Status   Specimen Description BLOOD LEFT WRIST  Final   Special Requests   Final    BOTTLES DRAWN AEROBIC AND ANAEROBIC Blood Culture results may not be optimal due to an inadequate volume of blood received in culture bottles   Culture   Final    NO GROWTH 5 DAYS Performed at [facility], [place] [address], [place], [place] [address]    Report Status 01/31/2023 FINAL  Final    SIGNED:   Charlynne Cousins, MD  [facility] 01/31/2023, 10:32 AM Pager   If 7PM-7AM, please contact night-coverage [email or web address] Password [email or web address]

## 2023-02-01 ENCOUNTER — Other Ambulatory Visit: Payer: Self-pay

## 2023-02-01 DIAGNOSIS — D631 Anemia in chronic kidney disease: Secondary | ICD-10-CM

## 2023-02-05 ENCOUNTER — Inpatient Hospital Stay: Payer: 59 | Admitting: Nurse Practitioner

## 2023-02-05 ENCOUNTER — Inpatient Hospital Stay: Payer: 59

## 2023-02-07 ENCOUNTER — Encounter (HOSPITAL_COMMUNITY)
Admission: RE | Admit: 2023-02-07 | Discharge: 2023-02-07 | Disposition: A | Discharge status: Home / Self Care | Payer: 59 | Source: Ambulatory Visit | Attending: Nephrology | Admitting: Nephrology

## 2023-02-07 VITALS — BP 166/72 | HR 65 | Temp 97.8°F | Resp 17

## 2023-02-07 DIAGNOSIS — N184 Chronic kidney disease, stage 4 (severe): Secondary | ICD-10-CM | POA: Diagnosis not present

## 2023-02-07 DIAGNOSIS — D631 Anemia in chronic kidney disease: Secondary | ICD-10-CM | POA: Diagnosis present

## 2023-02-07 LAB — POCT HEMOGLOBIN-HEMACUE: Hemoglobin: 8.9 g/dL — ABNORMAL LOW (ref 12.0–15.0)

## 2023-02-07 MED ORDER — EPOETIN ALFA-EPBX 10000 UNIT/ML IJ SOLN
INTRAMUSCULAR | Status: AC
  Filled 2023-02-07: qty 2

## 2023-02-07 MED ORDER — EPOETIN ALFA-EPBX 10000 UNIT/ML IJ SOLN
20000.0000 [IU] | INTRAMUSCULAR | Status: DC
  Administered 2023-02-07: 20000 [IU] via SUBCUTANEOUS

## 2023-02-15 ENCOUNTER — Encounter: Payer: Self-pay | Admitting: Nurse Practitioner

## 2023-02-15 ENCOUNTER — Other Ambulatory Visit: Payer: Self-pay

## 2023-02-15 ENCOUNTER — Inpatient Hospital Stay (HOSPITAL_BASED_OUTPATIENT_CLINIC_OR_DEPARTMENT_OTHER): Payer: 59 | Admitting: Nurse Practitioner

## 2023-02-15 ENCOUNTER — Inpatient Hospital Stay: Payer: 59 | Attending: Nurse Practitioner

## 2023-02-15 VITALS — BP 141/57 | HR 65 | Temp 98.2°F | Resp 18 | Ht 63.0 in | Wt 133.6 lb

## 2023-02-15 DIAGNOSIS — M85852 Other specified disorders of bone density and structure, left thigh: Secondary | ICD-10-CM | POA: Insufficient documentation

## 2023-02-15 DIAGNOSIS — L0390 Cellulitis, unspecified: Secondary | ICD-10-CM

## 2023-02-15 DIAGNOSIS — Z8542 Personal history of malignant neoplasm of other parts of uterus: Secondary | ICD-10-CM | POA: Diagnosis not present

## 2023-02-15 DIAGNOSIS — I129 Hypertensive chronic kidney disease with stage 1 through stage 4 chronic kidney disease, or unspecified chronic kidney disease: Secondary | ICD-10-CM | POA: Insufficient documentation

## 2023-02-15 DIAGNOSIS — Z90710 Acquired absence of both cervix and uterus: Secondary | ICD-10-CM | POA: Insufficient documentation

## 2023-02-15 DIAGNOSIS — C50411 Malignant neoplasm of upper-outer quadrant of right female breast: Secondary | ICD-10-CM | POA: Diagnosis not present

## 2023-02-15 DIAGNOSIS — R634 Abnormal weight loss: Secondary | ICD-10-CM | POA: Diagnosis not present

## 2023-02-15 DIAGNOSIS — Z853 Personal history of malignant neoplasm of breast: Secondary | ICD-10-CM | POA: Insufficient documentation

## 2023-02-15 DIAGNOSIS — N184 Chronic kidney disease, stage 4 (severe): Secondary | ICD-10-CM | POA: Diagnosis not present

## 2023-02-15 DIAGNOSIS — Z170 Estrogen receptor positive status [ER+]: Secondary | ICD-10-CM | POA: Diagnosis not present

## 2023-02-15 DIAGNOSIS — L03811 Cellulitis of head [any part, except face]: Secondary | ICD-10-CM | POA: Diagnosis not present

## 2023-02-15 DIAGNOSIS — D631 Anemia in chronic kidney disease: Secondary | ICD-10-CM

## 2023-02-15 DIAGNOSIS — E1122 Type 2 diabetes mellitus with diabetic chronic kidney disease: Secondary | ICD-10-CM | POA: Diagnosis not present

## 2023-02-15 LAB — CBC WITH DIFFERENTIAL (CANCER CENTER ONLY)
Abs Immature Granulocytes: 0.01 10*3/uL (ref 0.00–0.07)
Basophils Absolute: 0.1 10*3/uL (ref 0.0–0.1)
Basophils Relative: 1 %
Eosinophils Absolute: 0.1 10*3/uL (ref 0.0–0.5)
Eosinophils Relative: 3 %
HCT: 30.3 % — ABNORMAL LOW (ref 36.0–46.0)
Hemoglobin: 9.6 g/dL — ABNORMAL LOW (ref 12.0–15.0)
Immature Granulocytes: 0 %
Lymphocytes Relative: 27 %
Lymphs Abs: 1.1 10*3/uL (ref 0.7–4.0)
MCH: 32.1 pg (ref 26.0–34.0)
MCHC: 31.7 g/dL (ref 30.0–36.0)
MCV: 101.3 fL — ABNORMAL HIGH (ref 80.0–100.0)
Monocytes Absolute: 0.6 10*3/uL (ref 0.1–1.0)
Monocytes Relative: 15 %
Neutro Abs: 2.2 10*3/uL (ref 1.7–7.7)
Neutrophils Relative %: 54 %
Platelet Count: 253 10*3/uL (ref 150–400)
RBC: 2.99 MIL/uL — ABNORMAL LOW (ref 3.87–5.11)
RDW: 15.9 % — ABNORMAL HIGH (ref 11.5–15.5)
WBC Count: 4.2 10*3/uL (ref 4.0–10.5)
nRBC: 0 % (ref 0.0–0.2)

## 2023-02-15 LAB — IRON AND IRON BINDING CAPACITY (CC-WL,HP ONLY)
Iron: 108 ug/dL (ref 28–170)
Saturation Ratios: 48 % — ABNORMAL HIGH (ref 10.4–31.8)
TIBC: 227 ug/dL — ABNORMAL LOW (ref 250–450)
UIBC: 119 ug/dL — ABNORMAL LOW (ref 148–442)

## 2023-02-15 LAB — CMP (CANCER CENTER ONLY)
ALT: 7 U/L (ref 0–44)
AST: 14 U/L — ABNORMAL LOW (ref 15–41)
Albumin: 4 g/dL (ref 3.5–5.0)
Alkaline Phosphatase: 55 U/L (ref 38–126)
Anion gap: 10 (ref 5–15)
BUN: 50 mg/dL — ABNORMAL HIGH (ref 8–23)
CO2: 26 mmol/L (ref 22–32)
Calcium: 8.8 mg/dL — ABNORMAL LOW (ref 8.9–10.3)
Chloride: 107 mmol/L (ref 98–111)
Creatinine: 3.86 mg/dL — ABNORMAL HIGH (ref 0.44–1.00)
GFR, Estimated: 11 mL/min — ABNORMAL LOW (ref >60)
Glucose, Bld: 167 mg/dL — ABNORMAL HIGH (ref 70–99)
Potassium: 3.8 mmol/L (ref 3.5–5.1)
Sodium: 143 mmol/L (ref 135–145)
Total Bilirubin: 0.6 mg/dL (ref 0.3–1.2)
Total Protein: 7.1 g/dL (ref 6.5–8.1)

## 2023-02-15 LAB — FERRITIN: Ferritin: 136 ng/mL (ref 11–307)

## 2023-02-15 NOTE — Progress Notes (Signed)
Patient Care Team: Nolene Ebbs, MD as PCP - General (Internal Medicine) Jovita Kussmaul, MD as Consulting Physician (General Surgery) Truitt Merle, MD as Consulting Physician (Hematology) Thea Silversmith, MD as Consulting Physician (Radiation Oncology) Holley Bouche, NP (Inactive) as Nurse Practitioner (Nurse Practitioner)   CHIEF COMPLAINT: Follow up right breast cancer   Oncology History Overview Note  Cancer Staging Breast cancer of upper-outer quadrant of right female breast [facility]) Staging form: Breast, AJCC 7th Edition - Clinical: Stage IA (T1b, N0, M0) - Unsigned - Pathologic stage from 12/17/2014: Stage IA (T1c, N0, cM0) - Unsigned     Breast cancer of upper-outer quadrant of right female breast ([place])  10/26/2014 Imaging   screening mammogram showed a 41m mass in right breast at 10 o'clock position.    11/05/2014 Pathology Results   Biopsy showed grade 2 IDC, ER 90%+, PR 10%+, HER2-, with grade 1 DCIS    11/06/2014 Initial Diagnosis   Breast cancer of upper-outer quadrant of right female breast   12/17/2014 Definitive Surgery   Right lumpectomy with SLNB revealed grade 2, IDC spanning 1.1 cm; associated grade 2 DCIS. HER2 repeated and remains negative. Surgical margins clear.    12/17/2014 Pathologic Stage   pT1cpN0M0; Stage IA   01/07/2015 - 12/2019 Anti-estrogen oral therapy   Exemestane '25mg'$  daily since 01/14/15-12/2019   01/12/2015 Survivorship   Patient eligible for Survivorship after having completed all anti-cancer treatments (with exception of anti-estrogen) and currently NED.   11/06/2016 Mammogram   IMPRESSION: No mammographic evidence of malignancy.   11/12/2017 Mammogram   IMPRESSION: No mammographic evidence of malignancy.   RECOMMENDATION: Annual diagnostic mammography.   11/13/2018 Mammogram   IMPRESSION: No evidence of malignancy in either breast. RECOMMENDATION: Bilateral diagnostic mammogram in 1 year is recommended.   11/22/2018  Imaging   Bone scan:  FINDINGS: Scoliosis and mild degenerative uptake in the thoracic and lumbar spine. Increased uptake in the feet bilaterally, likely degenerative. No focal bone uptake suspicious for metastatic disease. IMPRESSION: No evidence for osseous metastatic disease. Degenerative uptake in the spine and feet.      CURRENT THERAPY: Surveillance   INTERVAL HISTORY Erica Whitney for follow up as scheduled. Last seen by me 02/02/22. She was recently hospitalized for facial cellulitis, discharged 01/31/23 and completed antibiotics. The site is still tender to touch and she is still chewing more on the right side. She has not seen ENT. Someone is sending frozen meals but not sure who. She will see PCP later in March. Denies breast concerns, new lump/mass, nipple discharge/inversion, or skin change. Denies signs of bleeding.   ROS  All other systems reviewed and negative   Past Medical History:  Diagnosis Date   Anemia in chronic kidney disease (CKD)    Arthritis    [place]    [place] cancer of [place]-outer quadrant of right female breast [place] 11/06/2014   Chronic kidney disease (CKD), stage IV (severe) ([facility])    Depression    Diabetes mellitus    Hypercholesteremia    Hypertension    Insomnia    Peptic ulcer    [place]    dx in her 581s  Wears glasses      Past Surgical History:  Procedure Laterality Date   ABDOMINAL HYSTERECTOMY  1995   BACK SURGERY  2000   lumb lam   BIOPSY  05/14/2021   Procedure: BIOPSY;  Surgeon: DDoran Stabler MD;  [place]  Service:  Gastroenterology;;   BREAST LUMPECTOMY Right 2015   COLONOSCOPY     ESOPHAGOGASTRODUODENOSCOPY N/A 05/14/2021   Procedure: ESOPHAGOGASTRODUODENOSCOPY (EGD);  Surgeon: Doran Stabler, MD;  Location: [place];  Service: Gastroenterology;  Laterality: N/A;   ESOPHAGOGASTRODUODENOSCOPY (EGD) WITH PROPOFOL N/A 05/26/2021   Procedure: ESOPHAGOGASTRODUODENOSCOPY  (EGD) WITH PROPOFOL;  Surgeon: Carol Ada, MD;  Location: [place];  Service: Endoscopy;  Laterality: N/A;   HEMOSTASIS CLIP PLACEMENT  05/26/2021   Procedure: HEMOSTASIS CLIP PLACEMENT;  Surgeon: Carol Ada, MD;  Location: [place];  Service: Endoscopy;;   HEMOSTASIS CONTROL  05/14/2021   Procedure: HEMOSTASIS CONTROL;  Surgeon: Doran Stabler, MD;  Location: [place];  Service: Gastroenterology;;   HOT HEMOSTASIS N/A 05/14/2021   Procedure: HOT HEMOSTASIS (ARGON PLASMA COAGULATION/BICAP);  Surgeon: Doran Stabler, MD;  Location: [place];  Service: Gastroenterology;  Laterality: N/A;   HOT HEMOSTASIS N/A 05/26/2021   Procedure: HOT HEMOSTASIS (ARGON PLASMA COAGULATION/BICAP);  Surgeon: Carol Ada, MD;  Location: [place];  Service: Endoscopy;  Laterality: N/A;   ORIF METACARPAL FRACTURE  2012   left   RADIOACTIVE SEED GUIDED PARTIAL MASTECTOMY WITH AXILLARY SENTINEL LYMPH NODE BIOPSY Right 12/17/2014   Procedure: RIGHT BREAST RADIOACTIVE SEED LOCALIZED LUMPECTOMY AND SENTINEL NODE MAPPING;  Surgeon: Autumn Messing III, MD;  Location: [place];  Service: General;  Laterality: Right;   SCLEROTHERAPY  05/26/2021   Procedure: Clide Deutscher;  Surgeon: Carol Ada, MD;  Location: [place];  Service: Endoscopy;;     Outpatient Encounter Medications as of 02/15/2023  Medication Sig Note   acetaminophen (TYLENOL) 650 MG CR tablet Take 1,300 mg by mouth as needed for pain.    amLODipine (NORVASC) 10 MG tablet Take 1 tablet (10 mg total) by mouth daily.    atorvastatin (LIPITOR) 40 MG tablet Take 40 mg by mouth daily.    calcitRIOL (ROCALTROL) 0.25 MCG capsule Take 0.25 mcg by mouth 3 (three) times a week. MWF    Camphor-Menthol-Methyl Sal (SALONPAS EX) Apply 1 patch topically as needed (pain).    cycloSPORINE (RESTASIS) 0.05 % ophthalmic emulsion Place 1 drop into both eyes 2 (two) times daily.    ferrous sulfate 325 (65 FE) MG tablet Take 1 tablet (325  mg total) by mouth daily with breakfast.    furosemide (LASIX) 40 MG tablet Take 1 tablet (40 mg total) by mouth daily as needed for fluid (ankle swelling). (Patient taking differently: Take 40 mg by mouth daily.)    gabapentin (NEURONTIN) 100 MG capsule Take 100 mg by mouth 2 (two) times daily. 01/26/2023: LF 09/23 for 90 DS. Daughter is adamant the Pt still has this medication and is taking it.    hydrALAZINE (APRESOLINE) 100 MG tablet Take 100 mg by mouth 3 (three) times daily.    metoprolol succinate (TOPROL-XL) 25 MG 24 hr tablet Take 25 mg by mouth daily. 01/26/2023: Daughter is unsure of exact time of last dose.    mirtazapine (REMERON) 30 MG tablet Take 30 mg by mouth at bedtime.    saccharomyces boulardii (FLORASTOR) 250 MG capsule Take 1 capsule (250 mg total) by mouth 2 (two) times daily.    senna-docusate (SENOKOT-S) 8.6-50 MG tablet Take 1 tablet by mouth at bedtime as needed for mild constipation or moderate constipation.    sertraline (ZOLOFT) 100 MG tablet Take 100 mg by mouth every morning.    traZODone (DESYREL) 50 MG tablet Take 50 mg by mouth at bedtime.    pantoprazole (PROTONIX) 40  MG tablet Take 1 tablet (40 mg total) by mouth 2 (two) times daily before a meal for 30 days, THEN 1 tablet (40 mg total) daily. (Patient taking differently: Take 40 mg by mouth in the morning )    No facility-administered encounter medications on file as of 02/15/2023.     Today's Vitals   02/15/23 1036 02/15/23 1049  BP: (!) 141/57   Pulse: 65   Resp: 18   Temp: 98.2 F (36.8 C)   TempSrc: Temporal   SpO2: 100%   Weight: 133 lb 9.6 oz (60.6 kg)   Height: '5\' 3"'$  (1.6 m)   PainSc:  0-No pain   Body mass index is 23.67 kg/m.   PHYSICAL EXAM GENERAL:alert, no distress and comfortable SKIN: no rash  HEENT: sclera clear. Ttp at right parotid. No erythema or warmth NECK: without mass LYMPH:  no palpable cervical or supraclavicular lymphadenopathy  LUNGS: normal breathing effort HEART:  regular rate & rhythm, no lower extremity edema ABDOMEN: abdomen soft, non-tender and normal bowel sounds NEURO: alert & oriented x 3 with fluent speech Breast exam: Symmetrical without nipple discharge or inversion.  S/p right lumpectomy, incisions completely healed.  No palpable mass or nodularity in either breast or axilla that I could appreciate   CBC    Component Value Date/Time   WBC 4.2 02/15/2023 0946   WBC 14.0 (H) 01/27/2023 0326   RBC 2.99 (L) 02/15/2023 0946   HGB 9.6 (L) 02/15/2023 0946   HGB 10.8 (L) 11/15/2017 0759   HCT 30.3 (L) 02/15/2023 0946   HCT 33.1 (L) 11/15/2017 0759   PLT 253 02/15/2023 0946   PLT 196 11/15/2017 0759   MCV 101.3 (H) 02/15/2023 0946   MCV 90.8 11/15/2017 0759   MCH 32.1 02/15/2023 0946   MCHC 31.7 02/15/2023 0946   RDW 15.9 (H) 02/15/2023 0946   RDW 14.2 11/15/2017 0759   LYMPHSABS 1.1 02/15/2023 0946   LYMPHSABS 2.0 11/15/2017 0759   MONOABS 0.6 02/15/2023 0946   MONOABS 0.9 11/15/2017 0759   EOSABS 0.1 02/15/2023 0946   EOSABS 0.2 11/15/2017 0759   BASOSABS 0.1 02/15/2023 0946   BASOSABS 0.1 11/15/2017 0759     CMP     Component Value Date/Time   NA 143 02/15/2023 0946   NA 141 11/15/2017 0759   K 3.8 02/15/2023 0946   K 4.4 11/15/2017 0759   CL 107 02/15/2023 0946   CO2 26 02/15/2023 0946   CO2 26 11/15/2017 0759   GLUCOSE 167 (H) 02/15/2023 0946   GLUCOSE 176 (H) 11/15/2017 0759   BUN 50 (H) 02/15/2023 0946   BUN 29.8 (H) 11/15/2017 0759   CREATININE 3.86 (H) 02/15/2023 0946   CREATININE 3.41 (H) 05/16/2022 0940   CREATININE 1.1 11/15/2017 0759   CALCIUM 8.8 (L) 02/15/2023 0946   CALCIUM 10.6 (H) 11/15/2017 0759   PROT 7.1 02/15/2023 0946   PROT 7.8 11/15/2017 0759   ALBUMIN 4.0 02/15/2023 0946   ALBUMIN 3.8 11/15/2017 0759   AST 14 (L) 02/15/2023 0946   AST 18 11/15/2017 0759   ALT 7 02/15/2023 0946   ALT 21 11/15/2017 0759   ALKPHOS 55 02/15/2023 0946   ALKPHOS 98 11/15/2017 0759   BILITOT 0.6 02/15/2023  0946   BILITOT 0.47 11/15/2017 0759   GFRNONAA 11 (L) 02/15/2023 0946   GFRNONAA 17 (L) 01/27/2021 0950   GFRAA 20 (L) 01/27/2021 0950     ASSESSMENT & PLAN:Erica Whitney is a 82 y.o. female with  1. 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 -Diagnosed in 10/2014. S/p right lumpectomy, adjuvant radiation and of AI with Exemestane, she completed 5 years 01/2021.  -She is overdue for mammogram  -Erica Whitney is clinically doing well from a breast cancer standpoint.  Exam is benign, labs are stable.  No clinical concern for breast cancer recurrence -Recurrence risk is minimal; she prefers to continue annual follow-up in our clinic   2.  Anemia of chronic disease  -She has had a mild anemia since 2015, Hgb around 11 -Since 2020 her hemoglobin has steadily declined -She was hospitalized in May and June of 2022 for bleeding duodenal ulcer, managed by Dr. Fuller Plan.  She is on PPI and oral iron -Began Epogen in 01/2022, currently on Retacrit per Dr. Royce Macadamia   3. Osteopenia -Her 11/2017 DEXA showed osteopenia, T score at left femoral neck -1.3. Her 11/2019 DEXA showed improvement and now normal (T-score 0.9).  -Continue calcium and vitamin D.  -Repeat DEXA 12/2022 with mammogram, she is overdue for both. Will order today   4. HTN, DM, CKD -Follow-up PCP and nephrologist  5.  Right parotid cellulitis -Presented with right mandibular swelling, redness, and confusion.  Is not chewing on that side and has switched to liquid diet with significant weight loss -She was hospitalized 2/9 - 01/31/2023, seen by ENT Dr. Constance Holster, treated with IV steroids, hydration, symptom management, and antibiotics -Completed oral Doxy, mandibular area still tender and not back to full chewing on that side -She has not seen ENT as instructed by hospitalist, will refer her   PLAN: -Recent hospital course and today's labs reviewed -Overdue to  mammogram and DEXA, ordered today -Refer to ENT for parotid cellulitis, not completely resolved -Continue PCP, nephrology, f/up  -Lab and f/up with me in 1 year  Orders Placed This Encounter  Procedures   MM 3D SCREEN BREAST BILATERAL    Standing Status:   Future    Standing Expiration Date:   02/15/2024    Order Specific Question:   Reason for Exam (SYMPTOM  OR DIAGNOSIS REQUIRED)    Answer:   h/o right breast cancer, screening    Order Specific Question:   Preferred imaging location?    Answer:   [facility]   DG Bone Density    Standing Status:   Future    Standing Expiration Date:   02/15/2024    Order Specific Question:   Reason for Exam (SYMPTOM  OR DIAGNOSIS REQUIRED)    Answer:   osteopenia, previously on AI for h/o breast cancer    Order Specific Question:   Preferred imaging location?    Answer:   [facility]   Ambulatory referral to ENT    Referral Priority:   Routine    Referral Type:   Consultation    Referral Reason:   Specialty Services Required    Referred to Provider:   Izora Gala, MD    Requested Specialty:   Otolaryngology    Number of Visits Requested:   1      All questions were answered. The patient knows to call the clinic with any problems, questions or concerns. No barriers to learning were detected. I spent 20 minutes counseling the patient face to face. The total time spent in the appointment was 30 minutes and more than 50% was on counseling, review of test results, and coordination of care.   Cira Rue, NP-C 02/15/2023

## 2023-02-21 ENCOUNTER — Encounter (HOSPITAL_COMMUNITY)
Admission: RE | Admit: 2023-02-21 | Discharge: 2023-02-21 | Disposition: A | Discharge status: Home / Self Care | Payer: 59 | Source: Ambulatory Visit | Attending: Nephrology | Admitting: Nephrology

## 2023-02-21 VITALS — BP 139/67 | HR 80 | Temp 97.2°F | Resp 17

## 2023-02-21 DIAGNOSIS — N184 Chronic kidney disease, stage 4 (severe): Secondary | ICD-10-CM | POA: Diagnosis present

## 2023-02-21 DIAGNOSIS — D631 Anemia in chronic kidney disease: Secondary | ICD-10-CM | POA: Insufficient documentation

## 2023-02-21 LAB — POCT HEMOGLOBIN-HEMACUE: Hemoglobin: 9.1 g/dL — ABNORMAL LOW (ref 12.0–15.0)

## 2023-02-21 LAB — IRON AND TIBC
Iron: 93 ug/dL (ref 28–170)
Saturation Ratios: 43 % — ABNORMAL HIGH (ref 10.4–31.8)
TIBC: 218 ug/dL — ABNORMAL LOW (ref 250–450)
UIBC: 125 ug/dL

## 2023-02-21 LAB — FERRITIN: Ferritin: 128 ng/mL (ref 11–307)

## 2023-02-21 MED ORDER — EPOETIN ALFA-EPBX 10000 UNIT/ML IJ SOLN
INTRAMUSCULAR | Status: AC
  Filled 2023-02-21: qty 2

## 2023-02-21 MED ORDER — EPOETIN ALFA-EPBX 10000 UNIT/ML IJ SOLN
20000.0000 [IU] | INTRAMUSCULAR | Status: DC
  Administered 2023-02-21: 20000 [IU] via SUBCUTANEOUS

## 2023-02-27 ENCOUNTER — Other Ambulatory Visit: Payer: Self-pay | Admitting: Internal Medicine

## 2023-02-28 LAB — LIPID PANEL
Cholesterol: 168 mg/dL (ref <200)
HDL: 61 mg/dL (ref >=50)
LDL Cholesterol (Calc): 84 mg/dL (calc)
Non-HDL Cholesterol (Calc): 107 mg/dL (calc) (ref <130)
Total CHOL/HDL Ratio: 2.8 (calc) (ref <5.0)
Triglycerides: 136 mg/dL (ref <150)

## 2023-02-28 LAB — COMPLETE METABOLIC PANEL WITH GFR
AG Ratio: 1.3 (calc) (ref 1.0–2.5)
ALT: 6 U/L (ref 6–29)
AST: 15 U/L (ref 10–35)
Albumin: 4.3 g/dL (ref 3.6–5.1)
Alkaline phosphatase (APISO): 54 U/L (ref 37–153)
BUN/Creatinine Ratio: 17 (calc) (ref 6–22)
BUN: 76 mg/dL — ABNORMAL HIGH (ref 7–25)
CO2: 22 mmol/L (ref 20–32)
Calcium: 10.1 mg/dL (ref 8.6–10.4)
Chloride: 103 mmol/L (ref 98–110)
Creat: 4.38 mg/dL — ABNORMAL HIGH (ref 0.60–0.95)
Globulin: 3.4 g/dL (calc) (ref 1.9–3.7)
Glucose, Bld: 108 mg/dL — ABNORMAL HIGH (ref 65–99)
Potassium: 3.9 mmol/L (ref 3.5–5.3)
Sodium: 140 mmol/L (ref 135–146)
Total Bilirubin: 0.6 mg/dL (ref 0.2–1.2)
Total Protein: 7.7 g/dL (ref 6.1–8.1)
eGFR: 10 mL/min/{1.73_m2} — ABNORMAL LOW (ref >=60)

## 2023-02-28 LAB — CBC
HCT: 33.4 % — ABNORMAL LOW (ref 35.0–45.0)
Hemoglobin: 10.4 g/dL — ABNORMAL LOW (ref 11.7–15.5)
MCH: 31.5 pg (ref 27.0–33.0)
MCHC: 31.1 g/dL — ABNORMAL LOW (ref 32.0–36.0)
MCV: 101.2 fL — ABNORMAL HIGH (ref 80.0–100.0)
MPV: 10.8 fL (ref 7.5–12.5)
Platelets: 228 10*3/uL (ref 140–400)
RBC: 3.3 10*6/uL — ABNORMAL LOW (ref 3.80–5.10)
RDW: 13.5 % (ref 11.0–15.0)
WBC: 5.2 10*3/uL (ref 3.8–10.8)

## 2023-02-28 LAB — URIC ACID: Uric Acid, Serum: 10.6 mg/dL — ABNORMAL HIGH (ref 2.5–7.0)

## 2023-02-28 LAB — TSH: TSH: 1.21 mIU/L (ref 0.40–4.50)

## 2023-03-07 ENCOUNTER — Encounter (HOSPITAL_COMMUNITY)
Admission: RE | Admit: 2023-03-07 | Discharge: 2023-03-07 | Disposition: A | Discharge status: Home / Self Care | Payer: 59 | Source: Ambulatory Visit | Attending: Nephrology | Admitting: Nephrology

## 2023-03-07 VITALS — BP 153/68 | HR 53 | Temp 97.2°F | Resp 17

## 2023-03-07 DIAGNOSIS — N184 Chronic kidney disease, stage 4 (severe): Secondary | ICD-10-CM | POA: Diagnosis not present

## 2023-03-07 DIAGNOSIS — D631 Anemia in chronic kidney disease: Secondary | ICD-10-CM

## 2023-03-07 LAB — POCT HEMOGLOBIN-HEMACUE: Hemoglobin: 10.7 g/dL — ABNORMAL LOW (ref 12.0–15.0)

## 2023-03-07 MED ORDER — EPOETIN ALFA-EPBX 10000 UNIT/ML IJ SOLN
INTRAMUSCULAR | Status: AC
  Filled 2023-03-07: qty 2

## 2023-03-07 MED ORDER — EPOETIN ALFA-EPBX 10000 UNIT/ML IJ SOLN
20000.0000 [IU] | INTRAMUSCULAR | Status: DC
  Administered 2023-03-07: 20000 [IU] via SUBCUTANEOUS

## 2023-03-21 ENCOUNTER — Encounter (HOSPITAL_COMMUNITY): Payer: 59

## 2023-03-28 ENCOUNTER — Encounter (HOSPITAL_COMMUNITY)
Admission: RE | Admit: 2023-03-28 | Discharge: 2023-03-28 | Disposition: A | Discharge status: Home / Self Care | Payer: 59 | Source: Ambulatory Visit | Attending: Nephrology | Admitting: Nephrology

## 2023-03-28 VITALS — BP 120/58 | HR 58 | Temp 97.3°F | Resp 17

## 2023-03-28 DIAGNOSIS — D631 Anemia in chronic kidney disease: Secondary | ICD-10-CM | POA: Diagnosis present

## 2023-03-28 DIAGNOSIS — N184 Chronic kidney disease, stage 4 (severe): Secondary | ICD-10-CM | POA: Diagnosis not present

## 2023-03-28 LAB — IRON AND TIBC
Iron: 74 ug/dL (ref 28–170)
Saturation Ratios: 46 % — ABNORMAL HIGH (ref 10.4–31.8)
TIBC: 160 ug/dL — ABNORMAL LOW (ref 250–450)
UIBC: 86 ug/dL

## 2023-03-28 LAB — FERRITIN: Ferritin: 177 ng/mL (ref 11–307)

## 2023-03-28 LAB — POCT HEMOGLOBIN-HEMACUE: Hemoglobin: 10.8 g/dL — ABNORMAL LOW (ref 12.0–15.0)

## 2023-03-28 MED ORDER — EPOETIN ALFA-EPBX 10000 UNIT/ML IJ SOLN
20000.0000 [IU] | INTRAMUSCULAR | Status: DC
  Administered 2023-03-28: 20000 [IU] via SUBCUTANEOUS

## 2023-03-28 MED ORDER — EPOETIN ALFA-EPBX 10000 UNIT/ML IJ SOLN
INTRAMUSCULAR | Status: AC
  Filled 2023-03-28: qty 2

## 2023-04-11 ENCOUNTER — Encounter (HOSPITAL_COMMUNITY)
Admission: RE | Admit: 2023-04-11 | Discharge: 2023-04-11 | Disposition: A | Discharge status: Home / Self Care | Payer: 59 | Source: Ambulatory Visit | Attending: Nephrology | Admitting: Nephrology

## 2023-04-11 VITALS — BP 131/66 | HR 61 | Temp 97.3°F | Resp 17

## 2023-04-11 DIAGNOSIS — N184 Chronic kidney disease, stage 4 (severe): Secondary | ICD-10-CM | POA: Diagnosis not present

## 2023-04-11 DIAGNOSIS — D631 Anemia in chronic kidney disease: Secondary | ICD-10-CM

## 2023-04-11 LAB — POCT HEMOGLOBIN-HEMACUE: Hemoglobin: 8.6 g/dL — ABNORMAL LOW (ref 12.0–15.0)

## 2023-04-11 MED ORDER — EPOETIN ALFA-EPBX 10000 UNIT/ML IJ SOLN: 20000.0000 [IU] | INTRAMUSCULAR | Status: DC

## 2023-04-11 MED ORDER — EPOETIN ALFA-EPBX 10000 UNIT/ML IJ SOLN
INTRAMUSCULAR | Status: AC
  Administered 2023-04-11: 20000 [IU] via SUBCUTANEOUS
  Filled 2023-04-11: qty 2

## 2023-04-25 ENCOUNTER — Encounter (HOSPITAL_COMMUNITY)
Admission: RE | Admit: 2023-04-25 | Discharge: 2023-04-25 | Disposition: A | Discharge status: Home / Self Care | Payer: 59 | Source: Ambulatory Visit | Attending: Nephrology | Admitting: Nephrology

## 2023-04-25 VITALS — BP 161/67 | HR 54 | Temp 97.8°F | Resp 17

## 2023-04-25 DIAGNOSIS — N184 Chronic kidney disease, stage 4 (severe): Secondary | ICD-10-CM | POA: Diagnosis present

## 2023-04-25 DIAGNOSIS — D631 Anemia in chronic kidney disease: Secondary | ICD-10-CM | POA: Diagnosis present

## 2023-04-25 LAB — IRON AND TIBC
Iron: 74 ug/dL (ref 28–170)
Saturation Ratios: 33 % — ABNORMAL HIGH (ref 10.4–31.8)
TIBC: 223 ug/dL — ABNORMAL LOW (ref 250–450)
UIBC: 149 ug/dL

## 2023-04-25 LAB — POCT HEMOGLOBIN-HEMACUE: Hemoglobin: 9.6 g/dL — ABNORMAL LOW (ref 12.0–15.0)

## 2023-04-25 LAB — FERRITIN: Ferritin: 92 ng/mL (ref 11–307)

## 2023-04-25 MED ORDER — EPOETIN ALFA-EPBX 10000 UNIT/ML IJ SOLN
INTRAMUSCULAR | Status: AC
  Administered 2023-04-25: 20000 [IU] via SUBCUTANEOUS
  Filled 2023-04-25: qty 2

## 2023-04-25 MED ORDER — EPOETIN ALFA-EPBX 10000 UNIT/ML IJ SOLN: 20000.0000 [IU] | INTRAMUSCULAR | Status: DC

## 2023-05-09 ENCOUNTER — Encounter (HOSPITAL_COMMUNITY)
Admission: RE | Admit: 2023-05-09 | Discharge: 2023-05-09 | Disposition: A | Discharge status: Home / Self Care | Payer: 59 | Source: Ambulatory Visit | Attending: Nephrology | Admitting: Nephrology

## 2023-05-09 VITALS — BP 175/70 | HR 59 | Temp 97.1°F | Resp 17

## 2023-05-09 DIAGNOSIS — D631 Anemia in chronic kidney disease: Secondary | ICD-10-CM

## 2023-05-09 DIAGNOSIS — N184 Chronic kidney disease, stage 4 (severe): Secondary | ICD-10-CM | POA: Diagnosis not present

## 2023-05-09 MED ORDER — EPOETIN ALFA-EPBX 10000 UNIT/ML IJ SOLN
INTRAMUSCULAR | Status: AC
  Filled 2023-05-09: qty 2

## 2023-05-09 MED ORDER — EPOETIN ALFA-EPBX 10000 UNIT/ML IJ SOLN
20000.0000 [IU] | INTRAMUSCULAR | Status: DC
  Administered 2023-05-09: 20000 [IU] via SUBCUTANEOUS

## 2023-05-10 LAB — POCT HEMOGLOBIN-HEMACUE: Hemoglobin: 11.6 g/dL — ABNORMAL LOW (ref 12.0–15.0)

## 2023-05-23 ENCOUNTER — Encounter (HOSPITAL_COMMUNITY)
Admission: RE | Admit: 2023-05-23 | Discharge: 2023-05-23 | Disposition: A | Discharge status: Home / Self Care | Payer: 59 | Source: Ambulatory Visit | Attending: Nephrology | Admitting: Nephrology

## 2023-05-23 VITALS — BP 175/63 | HR 58 | Temp 97.3°F | Resp 17

## 2023-05-23 DIAGNOSIS — D631 Anemia in chronic kidney disease: Secondary | ICD-10-CM | POA: Diagnosis present

## 2023-05-23 DIAGNOSIS — N184 Chronic kidney disease, stage 4 (severe): Secondary | ICD-10-CM | POA: Insufficient documentation

## 2023-05-23 LAB — IRON AND TIBC
Iron: 49 ug/dL (ref 28–170)
Saturation Ratios: 21 % (ref 10.4–31.8)
TIBC: 231 ug/dL — ABNORMAL LOW (ref 250–450)
UIBC: 182 ug/dL

## 2023-05-23 LAB — FERRITIN: Ferritin: 79 ng/mL (ref 11–307)

## 2023-05-23 MED ORDER — EPOETIN ALFA-EPBX 10000 UNIT/ML IJ SOLN
10000.0000 [IU] | INTRAMUSCULAR | Status: DC
  Administered 2023-05-23: 10000 [IU] via SUBCUTANEOUS

## 2023-05-23 MED ORDER — EPOETIN ALFA-EPBX 10000 UNIT/ML IJ SOLN
INTRAMUSCULAR | Status: AC
  Filled 2023-05-23: qty 1

## 2023-05-24 LAB — POCT HEMOGLOBIN-HEMACUE: Hemoglobin: 9.7 g/dL — ABNORMAL LOW (ref 12.0–15.0)

## 2023-06-06 ENCOUNTER — Encounter (HOSPITAL_COMMUNITY)
Admission: RE | Admit: 2023-06-06 | Discharge: 2023-06-06 | Disposition: A | Discharge status: Home / Self Care | Payer: 59 | Source: Ambulatory Visit | Attending: Nephrology | Admitting: Nephrology

## 2023-06-06 VITALS — BP 156/69 | HR 96 | Temp 97.6°F | Resp 17

## 2023-06-06 DIAGNOSIS — D631 Anemia in chronic kidney disease: Secondary | ICD-10-CM

## 2023-06-06 DIAGNOSIS — N184 Chronic kidney disease, stage 4 (severe): Secondary | ICD-10-CM | POA: Diagnosis not present

## 2023-06-06 LAB — POCT HEMOGLOBIN-HEMACUE: Hemoglobin: 10.4 g/dL — ABNORMAL LOW (ref 12.0–15.0)

## 2023-06-06 MED ORDER — EPOETIN ALFA-EPBX 10000 UNIT/ML IJ SOLN
20000.0000 [IU] | INTRAMUSCULAR | Status: DC
  Administered 2023-06-06: 20000 [IU] via SUBCUTANEOUS

## 2023-06-06 MED ORDER — EPOETIN ALFA-EPBX 10000 UNIT/ML IJ SOLN
INTRAMUSCULAR | Status: AC
  Filled 2023-06-06: qty 2

## 2023-06-20 ENCOUNTER — Ambulatory Visit (HOSPITAL_COMMUNITY)
Admission: RE | Admit: 2023-06-20 | Discharge: 2023-06-20 | Disposition: A | Discharge status: Home / Self Care | Payer: 59 | Source: Ambulatory Visit | Attending: Nephrology | Admitting: Nephrology

## 2023-06-20 VITALS — BP 161/62 | HR 54 | Temp 97.4°F | Resp 17

## 2023-06-20 DIAGNOSIS — D631 Anemia in chronic kidney disease: Secondary | ICD-10-CM | POA: Insufficient documentation

## 2023-06-20 DIAGNOSIS — N184 Chronic kidney disease, stage 4 (severe): Secondary | ICD-10-CM | POA: Insufficient documentation

## 2023-06-20 LAB — IRON AND TIBC
Iron: 63 ug/dL (ref 28–170)
Saturation Ratios: 26 % (ref 10.4–31.8)
TIBC: 242 ug/dL — ABNORMAL LOW (ref 250–450)
UIBC: 179 ug/dL

## 2023-06-20 LAB — POCT HEMOGLOBIN-HEMACUE: Hemoglobin: 11.2 g/dL — ABNORMAL LOW (ref 12.0–15.0)

## 2023-06-20 LAB — FERRITIN: Ferritin: 64 ng/mL (ref 11–307)

## 2023-06-20 MED ORDER — EPOETIN ALFA-EPBX 10000 UNIT/ML IJ SOLN
INTRAMUSCULAR | Status: AC
  Filled 2023-06-20: qty 2

## 2023-06-20 MED ORDER — EPOETIN ALFA-EPBX 10000 UNIT/ML IJ SOLN
20000.0000 [IU] | INTRAMUSCULAR | Status: DC
  Administered 2023-06-20: 20000 [IU] via SUBCUTANEOUS

## 2023-07-04 ENCOUNTER — Encounter (HOSPITAL_COMMUNITY)
Admission: RE | Admit: 2023-07-04 | Discharge: 2023-07-04 | Disposition: A | Discharge status: Home / Self Care | Payer: 59 | Source: Ambulatory Visit | Attending: Nephrology | Admitting: Nephrology

## 2023-07-04 VITALS — BP 170/67 | HR 53 | Temp 97.3°F | Resp 17

## 2023-07-04 DIAGNOSIS — N184 Chronic kidney disease, stage 4 (severe): Secondary | ICD-10-CM | POA: Diagnosis present

## 2023-07-04 DIAGNOSIS — D631 Anemia in chronic kidney disease: Secondary | ICD-10-CM | POA: Insufficient documentation

## 2023-07-04 LAB — POCT HEMOGLOBIN-HEMACUE: Hemoglobin: 11 g/dL — ABNORMAL LOW (ref 12.0–15.0)

## 2023-07-04 MED ORDER — EPOETIN ALFA-EPBX 10000 UNIT/ML IJ SOLN
20000.0000 [IU] | INTRAMUSCULAR | Status: DC
  Administered 2023-07-04: 20000 [IU] via SUBCUTANEOUS

## 2023-07-04 MED ORDER — EPOETIN ALFA-EPBX 10000 UNIT/ML IJ SOLN
INTRAMUSCULAR | Status: AC
  Filled 2023-07-04: qty 1

## 2023-07-18 ENCOUNTER — Encounter (HOSPITAL_COMMUNITY)
Admission: RE | Admit: 2023-07-18 | Discharge: 2023-07-18 | Disposition: A | Discharge status: Home / Self Care | Payer: 59 | Source: Ambulatory Visit | Attending: Nephrology | Admitting: Nephrology

## 2023-07-18 VITALS — BP 160/63 | HR 52 | Temp 97.1°F | Resp 18

## 2023-07-18 DIAGNOSIS — N184 Chronic kidney disease, stage 4 (severe): Secondary | ICD-10-CM | POA: Diagnosis not present

## 2023-07-18 DIAGNOSIS — D631 Anemia in chronic kidney disease: Secondary | ICD-10-CM

## 2023-07-18 LAB — POCT HEMOGLOBIN-HEMACUE: Hemoglobin: 12.6 g/dL (ref 12.0–15.0)

## 2023-07-18 MED ORDER — EPOETIN ALFA-EPBX 10000 UNIT/ML IJ SOLN
INTRAMUSCULAR | Status: AC
  Filled 2023-07-18: qty 1

## 2023-07-18 MED ORDER — EPOETIN ALFA-EPBX 10000 UNIT/ML IJ SOLN: 10000.0000 [IU] | INTRAMUSCULAR | Status: DC

## 2023-08-01 ENCOUNTER — Encounter (HOSPITAL_COMMUNITY): Payer: 59

## 2023-08-15 ENCOUNTER — Encounter (HOSPITAL_COMMUNITY)
Admission: RE | Admit: 2023-08-15 | Discharge: 2023-08-15 | Disposition: A | Discharge status: Home / Self Care | Payer: 59 | Source: Ambulatory Visit | Attending: Nephrology | Admitting: Nephrology

## 2023-08-15 ENCOUNTER — Encounter (HOSPITAL_COMMUNITY): Payer: 59

## 2023-08-15 VITALS — BP 174/70 | HR 56 | Temp 97.8°F | Resp 17

## 2023-08-15 DIAGNOSIS — D631 Anemia in chronic kidney disease: Secondary | ICD-10-CM | POA: Diagnosis present

## 2023-08-15 DIAGNOSIS — N184 Chronic kidney disease, stage 4 (severe): Secondary | ICD-10-CM | POA: Diagnosis present

## 2023-08-15 LAB — IRON AND TIBC
Iron: 64 ug/dL (ref 28–170)
Saturation Ratios: 31 % (ref 10.4–31.8)
TIBC: 209 ug/dL — ABNORMAL LOW (ref 250–450)
UIBC: 145 ug/dL

## 2023-08-15 LAB — FERRITIN: Ferritin: 147 ng/mL (ref 11–307)

## 2023-08-15 LAB — POCT HEMOGLOBIN-HEMACUE: Hemoglobin: 8.8 g/dL — ABNORMAL LOW (ref 12.0–15.0)

## 2023-08-15 MED ORDER — EPOETIN ALFA-EPBX 10000 UNIT/ML IJ SOLN
10000.0000 [IU] | INTRAMUSCULAR | Status: DC
  Administered 2023-08-15: 10000 [IU] via SUBCUTANEOUS

## 2023-08-15 MED ORDER — EPOETIN ALFA-EPBX 10000 UNIT/ML IJ SOLN
INTRAMUSCULAR | Status: AC
  Filled 2023-08-15: qty 1

## 2023-08-29 ENCOUNTER — Encounter (HOSPITAL_COMMUNITY): Payer: 59

## 2023-09-12 ENCOUNTER — Ambulatory Visit (HOSPITAL_COMMUNITY)
Admission: RE | Admit: 2023-09-12 | Discharge: 2023-09-12 | Disposition: A | Discharge status: Home / Self Care | Payer: 59 | Source: Ambulatory Visit | Attending: Nephrology | Admitting: Nephrology

## 2023-09-12 ENCOUNTER — Encounter (HOSPITAL_COMMUNITY): Payer: 59

## 2023-09-12 VITALS — BP 156/65 | HR 52 | Temp 98.2°F | Resp 16

## 2023-09-12 DIAGNOSIS — N184 Chronic kidney disease, stage 4 (severe): Secondary | ICD-10-CM | POA: Insufficient documentation

## 2023-09-12 DIAGNOSIS — D631 Anemia in chronic kidney disease: Secondary | ICD-10-CM | POA: Insufficient documentation

## 2023-09-12 LAB — FERRITIN: Ferritin: 117 ng/mL (ref 11–307)

## 2023-09-12 LAB — IRON AND TIBC
Iron: 49 ug/dL (ref 28–170)
Saturation Ratios: 22 % (ref 10.4–31.8)
TIBC: 223 ug/dL — ABNORMAL LOW (ref 250–450)
UIBC: 174 ug/dL

## 2023-09-12 LAB — POCT HEMOGLOBIN-HEMACUE: Hemoglobin: 7.2 g/dL — ABNORMAL LOW (ref 12.0–15.0)

## 2023-09-12 MED ORDER — EPOETIN ALFA-EPBX 10000 UNIT/ML IJ SOLN
INTRAMUSCULAR | Status: AC
  Administered 2023-09-12: 20000 [IU] via SUBCUTANEOUS
  Filled 2023-09-12: qty 2

## 2023-09-12 MED ORDER — EPOETIN ALFA-EPBX 10000 UNIT/ML IJ SOLN: 20000.0000 [IU] | INTRAMUSCULAR | Status: DC

## 2023-09-24 ENCOUNTER — Other Ambulatory Visit (HOSPITAL_COMMUNITY): Payer: Self-pay | Admitting: *Deleted

## 2023-09-26 ENCOUNTER — Encounter (HOSPITAL_COMMUNITY)
Admission: RE | Admit: 2023-09-26 | Discharge: 2023-09-26 | Disposition: A | Discharge status: Home / Self Care | Payer: 59 | Source: Ambulatory Visit | Attending: Nephrology | Admitting: Nephrology

## 2023-09-26 DIAGNOSIS — N184 Chronic kidney disease, stage 4 (severe): Secondary | ICD-10-CM | POA: Insufficient documentation

## 2023-09-26 DIAGNOSIS — D631 Anemia in chronic kidney disease: Secondary | ICD-10-CM | POA: Insufficient documentation

## 2023-09-27 ENCOUNTER — Other Ambulatory Visit (HOSPITAL_COMMUNITY): Payer: Self-pay | Admitting: *Deleted

## 2023-09-28 ENCOUNTER — Encounter (HOSPITAL_COMMUNITY)
Admission: RE | Admit: 2023-09-28 | Discharge: 2023-09-28 | Disposition: A | Discharge status: Home / Self Care | Payer: 59 | Source: Ambulatory Visit | Attending: Nephrology | Admitting: Nephrology

## 2023-09-28 VITALS — BP 176/66 | HR 50 | Temp 97.5°F | Resp 16

## 2023-09-28 DIAGNOSIS — N184 Chronic kidney disease, stage 4 (severe): Secondary | ICD-10-CM | POA: Diagnosis present

## 2023-09-28 DIAGNOSIS — D631 Anemia in chronic kidney disease: Secondary | ICD-10-CM

## 2023-09-28 LAB — POCT HEMOGLOBIN-HEMACUE: Hemoglobin: 8.1 g/dL — ABNORMAL LOW (ref 12.0–15.0)

## 2023-09-28 MED ORDER — EPOETIN ALFA-EPBX 10000 UNIT/ML IJ SOLN
20000.0000 [IU] | INTRAMUSCULAR | Status: DC
  Administered 2023-09-28: 20000 [IU] via SUBCUTANEOUS

## 2023-09-28 MED ORDER — EPOETIN ALFA-EPBX 10000 UNIT/ML IJ SOLN
INTRAMUSCULAR | Status: AC
  Filled 2023-09-28: qty 2

## 2023-10-10 ENCOUNTER — Encounter (HOSPITAL_COMMUNITY): Payer: 59

## 2023-10-12 ENCOUNTER — Encounter (HOSPITAL_COMMUNITY)
Admission: RE | Admit: 2023-10-12 | Discharge: 2023-10-12 | Disposition: A | Discharge status: Home / Self Care | Payer: 59 | Source: Ambulatory Visit | Attending: Nephrology | Admitting: Nephrology

## 2023-10-12 DIAGNOSIS — N184 Chronic kidney disease, stage 4 (severe): Secondary | ICD-10-CM | POA: Diagnosis not present

## 2023-10-12 LAB — POCT HEMOGLOBIN-HEMACUE: Hemoglobin: 9.1 g/dL — ABNORMAL LOW (ref 12.0–15.0)

## 2023-10-12 MED ORDER — EPOETIN ALFA-EPBX 10000 UNIT/ML IJ SOLN
INTRAMUSCULAR | Status: AC
  Administered 2023-10-12: 20000 [IU] via SUBCUTANEOUS
  Filled 2023-10-12: qty 2

## 2023-10-12 MED ORDER — SODIUM CHLORIDE 0.9 % IV SOLN
510.0000 mg | INTRAVENOUS | Status: DC
  Administered 2023-10-12: 510 mg via INTRAVENOUS
  Filled 2023-10-12: qty 510

## 2023-10-26 ENCOUNTER — Encounter (HOSPITAL_COMMUNITY)
Admission: RE | Admit: 2023-10-26 | Discharge: 2023-10-26 | Disposition: A | Discharge status: Home / Self Care | Payer: 59 | Source: Ambulatory Visit | Attending: Nephrology | Admitting: Nephrology

## 2023-10-26 VITALS — BP 141/64 | HR 56 | Temp 97.7°F | Resp 16 | Wt 114.0 lb

## 2023-10-26 DIAGNOSIS — N184 Chronic kidney disease, stage 4 (severe): Secondary | ICD-10-CM | POA: Diagnosis present

## 2023-10-26 DIAGNOSIS — D631 Anemia in chronic kidney disease: Secondary | ICD-10-CM | POA: Insufficient documentation

## 2023-10-26 MED ORDER — EPOETIN ALFA-EPBX 10000 UNIT/ML IJ SOLN
INTRAMUSCULAR | Status: AC
  Administered 2023-10-26: 20000 [IU] via SUBCUTANEOUS
  Filled 2023-10-26: qty 2

## 2023-10-26 MED ORDER — SODIUM CHLORIDE 0.9 % IV SOLN
510.0000 mg | INTRAVENOUS | Status: DC
  Administered 2023-10-26: 510 mg via INTRAVENOUS
  Filled 2023-10-26: qty 510

## 2023-10-26 MED ORDER — EPOETIN ALFA-EPBX 10000 UNIT/ML IJ SOLN: 20000.0000 [IU] | INTRAMUSCULAR | Status: DC

## 2023-10-29 LAB — POCT HEMOGLOBIN-HEMACUE: Hemoglobin: 9.2 g/dL — ABNORMAL LOW (ref 12.0–15.0)

## 2023-11-09 ENCOUNTER — Encounter (HOSPITAL_COMMUNITY)
Admission: RE | Admit: 2023-11-09 | Discharge: 2023-11-09 | Disposition: A | Discharge status: Home / Self Care | Payer: 59 | Source: Ambulatory Visit | Attending: Nephrology

## 2023-11-09 VITALS — BP 168/68 | HR 106 | Temp 97.2°F | Resp 17

## 2023-11-09 DIAGNOSIS — D631 Anemia in chronic kidney disease: Secondary | ICD-10-CM

## 2023-11-09 DIAGNOSIS — N184 Chronic kidney disease, stage 4 (severe): Secondary | ICD-10-CM | POA: Diagnosis not present

## 2023-11-09 LAB — IRON AND TIBC
Iron: 107 ug/dL (ref 28–170)
Saturation Ratios: 49 % — ABNORMAL HIGH (ref 10.4–31.8)
TIBC: 218 ug/dL — ABNORMAL LOW (ref 250–450)
UIBC: 111 ug/dL

## 2023-11-09 LAB — FERRITIN: Ferritin: 296 ng/mL (ref 11–307)

## 2023-11-09 LAB — POCT HEMOGLOBIN-HEMACUE: Hemoglobin: 10.8 g/dL — ABNORMAL LOW (ref 12.0–15.0)

## 2023-11-09 MED ORDER — EPOETIN ALFA-EPBX 10000 UNIT/ML IJ SOLN
INTRAMUSCULAR | Status: AC
  Filled 2023-11-09: qty 2

## 2023-11-09 MED ORDER — EPOETIN ALFA-EPBX 10000 UNIT/ML IJ SOLN
20000.0000 [IU] | INTRAMUSCULAR | Status: DC
  Administered 2023-11-09: 20000 [IU] via SUBCUTANEOUS

## 2023-11-23 ENCOUNTER — Encounter (HOSPITAL_COMMUNITY)
Admission: RE | Admit: 2023-11-23 | Discharge: 2023-11-23 | Disposition: A | Discharge status: Home / Self Care | Payer: 59 | Source: Ambulatory Visit | Attending: Nephrology | Admitting: Nephrology

## 2023-11-23 VITALS — BP 181/69 | HR 64 | Temp 98.0°F | Resp 17

## 2023-11-23 DIAGNOSIS — D631 Anemia in chronic kidney disease: Secondary | ICD-10-CM | POA: Insufficient documentation

## 2023-11-23 DIAGNOSIS — N184 Chronic kidney disease, stage 4 (severe): Secondary | ICD-10-CM | POA: Diagnosis present

## 2023-11-23 LAB — POCT HEMOGLOBIN-HEMACUE: Hemoglobin: 12.3 g/dL (ref 12.0–15.0)

## 2023-11-23 MED ORDER — EPOETIN ALFA-EPBX 10000 UNIT/ML IJ SOLN: 20000.0000 [IU] | INTRAMUSCULAR | Status: DC

## 2023-12-07 ENCOUNTER — Ambulatory Visit (HOSPITAL_COMMUNITY)
Admission: RE | Admit: 2023-12-07 | Discharge: 2023-12-07 | Disposition: A | Discharge status: Home / Self Care | Payer: 59 | Source: Ambulatory Visit | Attending: Nephrology | Admitting: Nephrology

## 2023-12-07 VITALS — BP 165/66 | HR 63 | Temp 97.2°F | Resp 16

## 2023-12-07 DIAGNOSIS — N184 Chronic kidney disease, stage 4 (severe): Secondary | ICD-10-CM | POA: Diagnosis present

## 2023-12-07 DIAGNOSIS — D631 Anemia in chronic kidney disease: Secondary | ICD-10-CM | POA: Diagnosis present

## 2023-12-07 LAB — IRON AND TIBC
Iron: 118 ug/dL (ref 28–170)
Saturation Ratios: 61 % — ABNORMAL HIGH (ref 10.4–31.8)
TIBC: 195 ug/dL — ABNORMAL LOW (ref 250–450)
UIBC: 77 ug/dL

## 2023-12-07 LAB — POCT HEMOGLOBIN-HEMACUE: Hemoglobin: 10.7 g/dL — ABNORMAL LOW (ref 12.0–15.0)

## 2023-12-07 LAB — FERRITIN: Ferritin: 362 ng/mL — ABNORMAL HIGH (ref 11–307)

## 2023-12-07 MED ORDER — EPOETIN ALFA-EPBX 10000 UNIT/ML IJ SOLN
20000.0000 [IU] | INTRAMUSCULAR | Status: DC
  Administered 2023-12-07: 20000 [IU] via SUBCUTANEOUS

## 2023-12-07 MED ORDER — EPOETIN ALFA-EPBX 10000 UNIT/ML IJ SOLN
INTRAMUSCULAR | Status: AC
  Filled 2023-12-07: qty 2

## 2023-12-21 ENCOUNTER — Encounter (HOSPITAL_COMMUNITY)
Admission: RE | Admit: 2023-12-21 | Discharge: 2023-12-21 | Disposition: A | Discharge status: Home / Self Care | Payer: 59 | Source: Ambulatory Visit | Attending: Nephrology | Admitting: Nephrology

## 2023-12-21 VITALS — BP 180/66 | HR 51 | Temp 97.3°F | Resp 16

## 2023-12-21 DIAGNOSIS — N184 Chronic kidney disease, stage 4 (severe): Secondary | ICD-10-CM | POA: Diagnosis present

## 2023-12-21 DIAGNOSIS — D631 Anemia in chronic kidney disease: Secondary | ICD-10-CM | POA: Insufficient documentation

## 2023-12-21 MED ORDER — EPOETIN ALFA-EPBX 10000 UNIT/ML IJ SOLN
INTRAMUSCULAR | Status: AC
  Filled 2023-12-21: qty 1

## 2023-12-21 MED ORDER — EPOETIN ALFA-EPBX 10000 UNIT/ML IJ SOLN
10000.0000 [IU] | INTRAMUSCULAR | Status: DC
  Administered 2023-12-21: 10000 [IU] via SUBCUTANEOUS

## 2023-12-24 LAB — POCT HEMOGLOBIN-HEMACUE: Hemoglobin: 11 g/dL — ABNORMAL LOW (ref 12.0–15.0)

## 2024-01-04 ENCOUNTER — Encounter (HOSPITAL_COMMUNITY)
Admission: RE | Admit: 2024-01-04 | Discharge: 2024-01-04 | Disposition: A | Discharge status: Home / Self Care | Payer: 59 | Source: Ambulatory Visit | Attending: Nephrology

## 2024-01-04 ENCOUNTER — Encounter (HOSPITAL_COMMUNITY): Payer: Self-pay

## 2024-01-04 VITALS — BP 163/65 | HR 54 | Temp 97.5°F | Resp 16

## 2024-01-04 DIAGNOSIS — N184 Chronic kidney disease, stage 4 (severe): Secondary | ICD-10-CM

## 2024-01-04 LAB — POCT HEMOGLOBIN-HEMACUE: Hemoglobin: 10.6 g/dL — ABNORMAL LOW (ref 12.0–15.0)

## 2024-01-04 LAB — FERRITIN: Ferritin: 245 ng/mL (ref 11–307)

## 2024-01-04 LAB — IRON AND TIBC
Iron: 119 ug/dL (ref 28–170)
Saturation Ratios: 61 % — ABNORMAL HIGH (ref 10.4–31.8)
TIBC: 196 ug/dL — ABNORMAL LOW (ref 250–450)
UIBC: 77 ug/dL

## 2024-01-04 MED ORDER — EPOETIN ALFA-EPBX 10000 UNIT/ML IJ SOLN
10000.0000 [IU] | INTRAMUSCULAR | Status: DC
  Administered 2024-01-04: 10000 [IU] via SUBCUTANEOUS

## 2024-01-04 MED ORDER — EPOETIN ALFA-EPBX 10000 UNIT/ML IJ SOLN
INTRAMUSCULAR | Status: AC
  Filled 2024-01-04: qty 1

## 2024-01-18 ENCOUNTER — Encounter (HOSPITAL_COMMUNITY)
Admission: RE | Admit: 2024-01-18 | Discharge: 2024-01-18 | Disposition: A | Discharge status: Home / Self Care | Payer: 59 | Source: Ambulatory Visit | Attending: Nephrology

## 2024-01-18 VITALS — BP 175/83 | HR 54 | Resp 16

## 2024-01-18 DIAGNOSIS — N184 Chronic kidney disease, stage 4 (severe): Secondary | ICD-10-CM | POA: Diagnosis not present

## 2024-01-18 DIAGNOSIS — D631 Anemia in chronic kidney disease: Secondary | ICD-10-CM

## 2024-01-18 LAB — POCT HEMOGLOBIN-HEMACUE: Hemoglobin: 10.2 g/dL — ABNORMAL LOW (ref 12.0–15.0)

## 2024-01-18 MED ORDER — EPOETIN ALFA-EPBX 10000 UNIT/ML IJ SOLN
INTRAMUSCULAR | Status: AC
  Filled 2024-01-18: qty 1

## 2024-01-18 MED ORDER — EPOETIN ALFA-EPBX 10000 UNIT/ML IJ SOLN
10000.0000 [IU] | INTRAMUSCULAR | Status: DC
Start: 2024-01-18 — End: 2025-01-30
  Administered 2024-01-18: 10000 [IU] via SUBCUTANEOUS

## 2024-01-23 ENCOUNTER — Telehealth: Payer: Self-pay | Admitting: Nurse Practitioner

## 2024-01-23 NOTE — Telephone Encounter (Signed)
 Rescheduled appointments per provider request. Called the patient two times and was unable to leave a voicemail. Patient will be mailed an appointment reminder.

## 2024-01-25 ENCOUNTER — Encounter (HOSPITAL_COMMUNITY): Payer: Self-pay

## 2024-02-01 ENCOUNTER — Ambulatory Visit (HOSPITAL_COMMUNITY)
Admission: RE | Admit: 2024-02-01 | Discharge: 2024-02-01 | Disposition: A | Discharge status: Home / Self Care | Payer: 59 | Source: Ambulatory Visit | Attending: Nephrology | Admitting: Nephrology

## 2024-02-01 VITALS — BP 168/71 | HR 53 | Temp 97.0°F | Resp 17

## 2024-02-01 DIAGNOSIS — D631 Anemia in chronic kidney disease: Secondary | ICD-10-CM | POA: Insufficient documentation

## 2024-02-01 DIAGNOSIS — N184 Chronic kidney disease, stage 4 (severe): Secondary | ICD-10-CM | POA: Diagnosis present

## 2024-02-01 LAB — IRON AND TIBC
Iron: 158 ug/dL (ref 28–170)
Saturation Ratios: 80 % — ABNORMAL HIGH (ref 10.4–31.8)
TIBC: 199 ug/dL — ABNORMAL LOW (ref 250–450)
UIBC: 41 ug/dL

## 2024-02-01 LAB — FERRITIN: Ferritin: 275 ng/mL (ref 11–307)

## 2024-02-01 LAB — POCT HEMOGLOBIN-HEMACUE: Hemoglobin: 10.6 g/dL — ABNORMAL LOW (ref 12.0–15.0)

## 2024-02-01 MED ORDER — EPOETIN ALFA-EPBX 10000 UNIT/ML IJ SOLN
INTRAMUSCULAR | Status: AC
  Filled 2024-02-01: qty 1

## 2024-02-01 MED ORDER — EPOETIN ALFA-EPBX 10000 UNIT/ML IJ SOLN
10000.0000 [IU] | INTRAMUSCULAR | Status: DC
  Administered 2024-02-01: 10000 [IU] via SUBCUTANEOUS

## 2024-02-13 ENCOUNTER — Other Ambulatory Visit: Payer: Self-pay

## 2024-02-13 DIAGNOSIS — Z170 Estrogen receptor positive status [ER+]: Secondary | ICD-10-CM

## 2024-02-13 DIAGNOSIS — D631 Anemia in chronic kidney disease: Secondary | ICD-10-CM

## 2024-02-14 ENCOUNTER — Inpatient Hospital Stay: Payer: 59 | Admitting: Nurse Practitioner

## 2024-02-14 ENCOUNTER — Other Ambulatory Visit: Payer: 59

## 2024-02-14 ENCOUNTER — Telehealth: Payer: Self-pay

## 2024-02-14 ENCOUNTER — Inpatient Hospital Stay: Payer: 59 | Attending: Nurse Practitioner

## 2024-02-14 ENCOUNTER — Ambulatory Visit: Payer: 59 | Admitting: Nurse Practitioner

## 2024-02-14 NOTE — Telephone Encounter (Signed)
 Attempted to contact the patient and patient's daughter regarding pt's missed appt from today. Unable to reach either individual. Unable to LVM.

## 2024-02-15 ENCOUNTER — Encounter (HOSPITAL_COMMUNITY)
Admission: RE | Admit: 2024-02-15 | Discharge: 2024-02-15 | Disposition: A | Discharge status: Home / Self Care | Payer: 59 | Source: Ambulatory Visit | Attending: Nephrology | Admitting: Nephrology

## 2024-02-15 VITALS — BP 140/65 | HR 59 | Temp 97.3°F | Resp 17

## 2024-02-15 DIAGNOSIS — N184 Chronic kidney disease, stage 4 (severe): Secondary | ICD-10-CM | POA: Diagnosis present

## 2024-02-15 DIAGNOSIS — D631 Anemia in chronic kidney disease: Secondary | ICD-10-CM | POA: Insufficient documentation

## 2024-02-15 LAB — POCT HEMOGLOBIN-HEMACUE: Hemoglobin: 10.2 g/dL — ABNORMAL LOW (ref 12.0–15.0)

## 2024-02-15 MED ORDER — EPOETIN ALFA-EPBX 10000 UNIT/ML IJ SOLN
INTRAMUSCULAR | Status: AC
  Filled 2024-02-15: qty 1

## 2024-02-15 MED ORDER — EPOETIN ALFA-EPBX 10000 UNIT/ML IJ SOLN
10000.0000 [IU] | INTRAMUSCULAR | Status: DC
  Administered 2024-02-15: 10000 [IU] via SUBCUTANEOUS

## 2024-02-29 ENCOUNTER — Ambulatory Visit (HOSPITAL_COMMUNITY)
Admission: RE | Admit: 2024-02-29 | Discharge: 2024-02-29 | Disposition: A | Discharge status: Home / Self Care | Payer: 59 | Source: Ambulatory Visit | Attending: Nephrology | Admitting: Nephrology

## 2024-02-29 VITALS — BP 156/93 | HR 57 | Temp 97.4°F | Resp 16

## 2024-02-29 DIAGNOSIS — N184 Chronic kidney disease, stage 4 (severe): Secondary | ICD-10-CM | POA: Insufficient documentation

## 2024-02-29 DIAGNOSIS — D631 Anemia in chronic kidney disease: Secondary | ICD-10-CM | POA: Diagnosis present

## 2024-02-29 LAB — POCT HEMOGLOBIN-HEMACUE: Hemoglobin: 10.1 g/dL — ABNORMAL LOW (ref 12.0–15.0)

## 2024-02-29 LAB — IRON AND TIBC
Iron: 73 ug/dL (ref 28–170)
Saturation Ratios: 36 % — ABNORMAL HIGH (ref 10.4–31.8)
TIBC: 204 ug/dL — ABNORMAL LOW (ref 250–450)
UIBC: 131 ug/dL

## 2024-02-29 LAB — FERRITIN: Ferritin: 259 ng/mL (ref 11–307)

## 2024-02-29 MED ORDER — EPOETIN ALFA-EPBX 10000 UNIT/ML IJ SOLN
INTRAMUSCULAR | Status: AC
Start: 2024-02-29 — End: 2024-02-29
  Filled 2024-02-29: qty 1

## 2024-02-29 MED ORDER — EPOETIN ALFA-EPBX 10000 UNIT/ML IJ SOLN
10000.0000 [IU] | INTRAMUSCULAR | Status: DC
  Administered 2024-02-29: 10000 [IU] via SUBCUTANEOUS

## 2024-03-14 ENCOUNTER — Ambulatory Visit (HOSPITAL_COMMUNITY)
Admission: RE | Admit: 2024-03-14 | Discharge: 2024-03-14 | Disposition: A | Discharge status: Home / Self Care | Source: Ambulatory Visit | Attending: Nephrology | Admitting: Nephrology

## 2024-03-14 VITALS — BP 164/73 | HR 56 | Temp 97.3°F | Resp 16

## 2024-03-14 DIAGNOSIS — N184 Chronic kidney disease, stage 4 (severe): Secondary | ICD-10-CM | POA: Insufficient documentation

## 2024-03-14 DIAGNOSIS — D631 Anemia in chronic kidney disease: Secondary | ICD-10-CM | POA: Diagnosis present

## 2024-03-14 LAB — POCT HEMOGLOBIN-HEMACUE: Hemoglobin: 10.4 g/dL — ABNORMAL LOW (ref 12.0–15.0)

## 2024-03-14 MED ORDER — EPOETIN ALFA-EPBX 10000 UNIT/ML IJ SOLN
INTRAMUSCULAR | Status: AC
  Administered 2024-03-14: 10000 [IU] via SUBCUTANEOUS
  Filled 2024-03-14: qty 1

## 2024-03-14 MED ORDER — EPOETIN ALFA-EPBX 10000 UNIT/ML IJ SOLN: 10000.0000 [IU] | INTRAMUSCULAR | Status: DC

## 2024-03-28 ENCOUNTER — Encounter (HOSPITAL_COMMUNITY)
Admission: RE | Admit: 2024-03-28 | Discharge: 2024-03-28 | Disposition: A | Discharge status: Home / Self Care | Source: Ambulatory Visit | Attending: Nephrology | Admitting: Nephrology

## 2024-03-28 VITALS — BP 133/57 | HR 59 | Temp 97.5°F | Resp 16

## 2024-03-28 DIAGNOSIS — N184 Chronic kidney disease, stage 4 (severe): Secondary | ICD-10-CM | POA: Insufficient documentation

## 2024-03-28 DIAGNOSIS — D631 Anemia in chronic kidney disease: Secondary | ICD-10-CM | POA: Insufficient documentation

## 2024-03-28 LAB — POCT HEMOGLOBIN-HEMACUE: Hemoglobin: 9.3 g/dL — ABNORMAL LOW (ref 12.0–15.0)

## 2024-03-28 LAB — IRON AND TIBC
Iron: 102 ug/dL (ref 28–170)
Saturation Ratios: 49 % — ABNORMAL HIGH (ref 10.4–31.8)
TIBC: 207 ug/dL — ABNORMAL LOW (ref 250–450)
UIBC: 105 ug/dL

## 2024-03-28 LAB — FERRITIN: Ferritin: 342 ng/mL — ABNORMAL HIGH (ref 11–307)

## 2024-03-28 MED ORDER — EPOETIN ALFA-EPBX 10000 UNIT/ML IJ SOLN
INTRAMUSCULAR | Status: AC
Start: 2024-03-28 — End: 2024-03-28
  Administered 2024-03-28: 10000 [IU] via SUBCUTANEOUS
  Filled 2024-03-28: qty 1

## 2024-03-28 MED ORDER — EPOETIN ALFA-EPBX 10000 UNIT/ML IJ SOLN: 10000.0000 [IU] | INTRAMUSCULAR | Status: DC

## 2024-04-11 ENCOUNTER — Encounter (HOSPITAL_COMMUNITY)
Admission: RE | Admit: 2024-04-11 | Discharge: 2024-04-11 | Disposition: A | Discharge status: Home / Self Care | Source: Ambulatory Visit | Attending: Nephrology | Admitting: Nephrology

## 2024-04-11 DIAGNOSIS — N184 Chronic kidney disease, stage 4 (severe): Secondary | ICD-10-CM | POA: Diagnosis not present

## 2024-04-11 DIAGNOSIS — D631 Anemia in chronic kidney disease: Secondary | ICD-10-CM

## 2024-04-11 LAB — POCT HEMOGLOBIN-HEMACUE: Hemoglobin: 9.3 g/dL — ABNORMAL LOW (ref 12.0–15.0)

## 2024-04-11 MED ORDER — EPOETIN ALFA 10000 UNIT/ML IJ SOLN
INTRAMUSCULAR | Status: AC
  Filled 2024-04-11: qty 1

## 2024-04-11 MED ORDER — EPOETIN ALFA 10000 UNIT/ML IJ SOLN
10000.0000 [IU] | Freq: Once | INTRAMUSCULAR | Status: AC
  Administered 2024-04-11: 10000 [IU] via SUBCUTANEOUS

## 2024-04-25 ENCOUNTER — Encounter (HOSPITAL_COMMUNITY)

## 2024-04-28 ENCOUNTER — Encounter (HOSPITAL_COMMUNITY)
Admission: RE | Admit: 2024-04-28 | Discharge: 2024-04-28 | Disposition: A | Discharge status: Home / Self Care | Source: Ambulatory Visit | Attending: Nephrology | Admitting: Nephrology

## 2024-04-28 VITALS — BP 136/58 | HR 57 | Temp 97.6°F | Resp 16

## 2024-04-28 DIAGNOSIS — D631 Anemia in chronic kidney disease: Secondary | ICD-10-CM | POA: Insufficient documentation

## 2024-04-28 DIAGNOSIS — N184 Chronic kidney disease, stage 4 (severe): Secondary | ICD-10-CM | POA: Insufficient documentation

## 2024-04-28 LAB — IRON AND TIBC
Iron: 93 ug/dL (ref 28–170)
Saturation Ratios: 46 % — ABNORMAL HIGH (ref 10.4–31.8)
TIBC: 203 ug/dL — ABNORMAL LOW (ref 250–450)
UIBC: 110 ug/dL

## 2024-04-28 LAB — POCT HEMOGLOBIN-HEMACUE: Hemoglobin: 9.1 g/dL — ABNORMAL LOW (ref 12.0–15.0)

## 2024-04-28 LAB — FERRITIN: Ferritin: 274 ng/mL (ref 11–307)

## 2024-04-28 MED ORDER — EPOETIN ALFA-EPBX 10000 UNIT/ML IJ SOLN
20000.0000 [IU] | Freq: Once | INTRAMUSCULAR | Status: AC
  Administered 2024-04-28: 20000 [IU] via SUBCUTANEOUS

## 2024-04-28 MED ORDER — EPOETIN ALFA-EPBX 10000 UNIT/ML IJ SOLN
INTRAMUSCULAR | Status: AC
  Filled 2024-04-28: qty 2

## 2024-05-13 ENCOUNTER — Encounter (HOSPITAL_COMMUNITY)

## 2024-05-14 ENCOUNTER — Encounter (HOSPITAL_COMMUNITY)
Admission: RE | Admit: 2024-05-14 | Discharge: 2024-05-14 | Disposition: A | Discharge status: Home / Self Care | Source: Ambulatory Visit | Attending: Nephrology

## 2024-05-14 VITALS — BP 124/58 | HR 62 | Temp 97.8°F | Resp 16

## 2024-05-14 DIAGNOSIS — N184 Chronic kidney disease, stage 4 (severe): Secondary | ICD-10-CM | POA: Diagnosis not present

## 2024-05-14 DIAGNOSIS — D631 Anemia in chronic kidney disease: Secondary | ICD-10-CM

## 2024-05-14 LAB — POCT HEMOGLOBIN-HEMACUE: Hemoglobin: 8.9 g/dL — ABNORMAL LOW (ref 12.0–15.0)

## 2024-05-14 MED ORDER — EPOETIN ALFA-EPBX 10000 UNIT/ML IJ SOLN
INTRAMUSCULAR | Status: AC
  Filled 2024-05-14: qty 2

## 2024-05-14 MED ORDER — EPOETIN ALFA-EPBX 10000 UNIT/ML IJ SOLN
20000.0000 [IU] | Freq: Once | INTRAMUSCULAR | Status: AC
  Administered 2024-05-14: 20000 [IU] via SUBCUTANEOUS

## 2024-05-28 ENCOUNTER — Encounter (HOSPITAL_COMMUNITY)
Admission: RE | Admit: 2024-05-28 | Discharge: 2024-05-28 | Disposition: A | Discharge status: Home / Self Care | Source: Ambulatory Visit | Attending: Nephrology | Admitting: Nephrology

## 2024-05-28 VITALS — BP 172/71 | HR 55 | Temp 97.3°F | Resp 16

## 2024-05-28 DIAGNOSIS — N184 Chronic kidney disease, stage 4 (severe): Secondary | ICD-10-CM | POA: Insufficient documentation

## 2024-05-28 DIAGNOSIS — D631 Anemia in chronic kidney disease: Secondary | ICD-10-CM | POA: Insufficient documentation

## 2024-05-28 DIAGNOSIS — N185 Chronic kidney disease, stage 5: Secondary | ICD-10-CM | POA: Insufficient documentation

## 2024-05-28 LAB — IRON AND TIBC
Iron: 78 ug/dL (ref 28–170)
Saturation Ratios: 37 % — ABNORMAL HIGH (ref 10.4–31.8)
TIBC: 210 ug/dL — ABNORMAL LOW (ref 250–450)
UIBC: 132 ug/dL

## 2024-05-28 LAB — FERRITIN: Ferritin: 179 ng/mL (ref 11–307)

## 2024-05-28 MED ORDER — EPOETIN ALFA-EPBX 10000 UNIT/ML IJ SOLN
INTRAMUSCULAR | Status: AC
  Filled 2024-05-28: qty 2

## 2024-05-28 MED ORDER — EPOETIN ALFA-EPBX 10000 UNIT/ML IJ SOLN
20000.0000 [IU] | Freq: Once | INTRAMUSCULAR | Status: AC
  Administered 2024-05-28: 20000 [IU] via SUBCUTANEOUS
  Filled 2024-05-28: qty 1

## 2024-05-29 LAB — POCT HEMOGLOBIN-HEMACUE: Hemoglobin: 9.1 g/dL — ABNORMAL LOW (ref 12.0–15.0)

## 2024-06-11 ENCOUNTER — Encounter (HOSPITAL_COMMUNITY)

## 2024-06-18 ENCOUNTER — Ambulatory Visit (HOSPITAL_COMMUNITY)
Admission: RE | Admit: 2024-06-18 | Discharge: 2024-06-18 | Disposition: A | Discharge status: Home / Self Care | Source: Ambulatory Visit | Attending: Nephrology | Admitting: Nephrology

## 2024-06-18 VITALS — BP 131/63 | HR 65 | Temp 97.0°F | Resp 16

## 2024-06-18 DIAGNOSIS — D631 Anemia in chronic kidney disease: Secondary | ICD-10-CM | POA: Insufficient documentation

## 2024-06-18 DIAGNOSIS — N184 Chronic kidney disease, stage 4 (severe): Secondary | ICD-10-CM | POA: Diagnosis present

## 2024-06-18 LAB — POCT HEMOGLOBIN-HEMACUE: Hemoglobin: 9.2 g/dL — ABNORMAL LOW (ref 12.0–15.0)

## 2024-06-18 MED ORDER — EPOETIN ALFA-EPBX 10000 UNIT/ML IJ SOLN
20000.0000 [IU] | Freq: Once | INTRAMUSCULAR | Status: AC
  Administered 2024-06-18: 20000 [IU] via SUBCUTANEOUS
  Filled 2024-06-18: qty 1

## 2024-06-18 MED ORDER — EPOETIN ALFA-EPBX 10000 UNIT/ML IJ SOLN
INTRAMUSCULAR | Status: AC
  Filled 2024-06-18: qty 2

## 2024-06-25 ENCOUNTER — Encounter (HOSPITAL_COMMUNITY)

## 2024-07-02 ENCOUNTER — Encounter (HOSPITAL_COMMUNITY)
Admission: RE | Admit: 2024-07-02 | Discharge: 2024-07-02 | Disposition: A | Discharge status: Home / Self Care | Source: Ambulatory Visit | Attending: Nephrology | Admitting: Nephrology

## 2024-07-02 VITALS — BP 120/60 | HR 57 | Temp 97.8°F | Resp 16

## 2024-07-02 DIAGNOSIS — N184 Chronic kidney disease, stage 4 (severe): Secondary | ICD-10-CM | POA: Diagnosis present

## 2024-07-02 DIAGNOSIS — D631 Anemia in chronic kidney disease: Secondary | ICD-10-CM | POA: Diagnosis present

## 2024-07-02 LAB — POCT HEMOGLOBIN-HEMACUE: Hemoglobin: 9.6 g/dL — ABNORMAL LOW (ref 12.0–15.0)

## 2024-07-02 LAB — IRON AND TIBC
Iron: 40 ug/dL (ref 28–170)
Saturation Ratios: 21 % (ref 10.4–31.8)
TIBC: 188 ug/dL — ABNORMAL LOW (ref 250–450)
UIBC: 148 ug/dL

## 2024-07-02 LAB — FERRITIN: Ferritin: 226 ng/mL (ref 11–307)

## 2024-07-02 MED ORDER — EPOETIN ALFA-EPBX 10000 UNIT/ML IJ SOLN
INTRAMUSCULAR | Status: AC
  Filled 2024-07-02: qty 2

## 2024-07-02 MED ORDER — EPOETIN ALFA-EPBX 10000 UNIT/ML IJ SOLN
20000.0000 [IU] | Freq: Once | INTRAMUSCULAR | Status: AC
  Administered 2024-07-02: 20000 [IU] via SUBCUTANEOUS

## 2024-07-11 ENCOUNTER — Telehealth (HOSPITAL_COMMUNITY): Payer: Self-pay | Admitting: Pharmacy Technician

## 2024-07-11 NOTE — Telephone Encounter (Signed)
 Auth Submission: NO AUTH NEEDED Site of care: [facility] Payer: [employer] DUAL COMPLETE Medication & CPT/J Code(s) submitted: Venofer (Iron Sucrose) [id number] Diagnosis Code: N18.4 Route of submission (phone, fax, portal):  Phone # Fax # Auth type: Buy/Bill HB Units/visits requested: 200mg  x 1 dose Reference number: [id number] Approval from: 07/11/24 to 11/11/24      Dagoberto Armour, CPhT [facility] Phone: [phone] 07/11/2024

## 2024-07-14 ENCOUNTER — Other Ambulatory Visit (HOSPITAL_COMMUNITY): Payer: Self-pay | Admitting: *Deleted

## 2024-07-16 ENCOUNTER — Inpatient Hospital Stay (HOSPITAL_COMMUNITY)
Admission: RE | Admit: 2024-07-16 | Discharge: 2024-07-16 | Disposition: A | Discharge status: Home / Self Care | Source: Ambulatory Visit | Attending: Nephrology

## 2024-07-16 VITALS — BP 121/58 | HR 63 | Temp 97.4°F | Resp 16

## 2024-07-16 DIAGNOSIS — D631 Anemia in chronic kidney disease: Secondary | ICD-10-CM

## 2024-07-16 DIAGNOSIS — N184 Chronic kidney disease, stage 4 (severe): Secondary | ICD-10-CM | POA: Diagnosis not present

## 2024-07-16 LAB — POCT HEMOGLOBIN-HEMACUE: Hemoglobin: 10.2 g/dL — ABNORMAL LOW (ref 12.0–15.0)

## 2024-07-16 MED ORDER — EPOETIN ALFA-EPBX 10000 UNIT/ML IJ SOLN
20000.0000 [IU] | Freq: Once | INTRAMUSCULAR | Status: AC
  Administered 2024-07-16: 20000 [IU] via SUBCUTANEOUS
  Filled 2024-07-16: qty 1

## 2024-07-16 MED ORDER — EPOETIN ALFA-EPBX 10000 UNIT/ML IJ SOLN
INTRAMUSCULAR | Status: AC
Start: 2024-07-16 — End: 2024-07-16
  Filled 2024-07-16: qty 2

## 2024-07-30 ENCOUNTER — Encounter (HOSPITAL_COMMUNITY)
Admission: RE | Admit: 2024-07-30 | Discharge: 2024-07-30 | Disposition: A | Discharge status: Home / Self Care | Source: Ambulatory Visit | Attending: Nephrology | Admitting: Nephrology

## 2024-07-30 VITALS — BP 163/72 | HR 63 | Temp 97.2°F | Resp 16

## 2024-07-30 DIAGNOSIS — D631 Anemia in chronic kidney disease: Secondary | ICD-10-CM | POA: Insufficient documentation

## 2024-07-30 DIAGNOSIS — N184 Chronic kidney disease, stage 4 (severe): Secondary | ICD-10-CM | POA: Insufficient documentation

## 2024-07-30 LAB — POCT HEMOGLOBIN-HEMACUE: Hemoglobin: 10.2 g/dL — ABNORMAL LOW (ref 12.0–15.0)

## 2024-07-30 MED ORDER — EPOETIN ALFA-EPBX 10000 UNIT/ML IJ SOLN
20000.0000 [IU] | INTRAMUSCULAR | Status: DC
  Administered 2024-07-30 (×2): 20000 [IU] via SUBCUTANEOUS

## 2024-07-30 MED ORDER — IRON SUCROSE 200 MG IVPB - SIMPLE MED
200.0000 mg | Freq: Once | Status: AC
  Administered 2024-07-30 (×2): 200 mg via INTRAVENOUS
  Filled 2024-07-30: qty 200

## 2024-07-30 MED ORDER — EPOETIN ALFA-EPBX 10000 UNIT/ML IJ SOLN
INTRAMUSCULAR | Status: AC
  Filled 2024-07-30: qty 2

## 2024-08-13 ENCOUNTER — Encounter (HOSPITAL_COMMUNITY)
Admission: RE | Admit: 2024-08-13 | Discharge: 2024-08-13 | Disposition: A | Discharge status: Home / Self Care | Source: Ambulatory Visit | Attending: Nephrology | Admitting: Nephrology

## 2024-08-13 VITALS — BP 119/59 | HR 55 | Temp 97.1°F | Resp 16

## 2024-08-13 DIAGNOSIS — N184 Chronic kidney disease, stage 4 (severe): Secondary | ICD-10-CM | POA: Diagnosis not present

## 2024-08-13 DIAGNOSIS — D631 Anemia in chronic kidney disease: Secondary | ICD-10-CM

## 2024-08-13 LAB — FERRITIN: Ferritin: 192 ng/mL (ref 11–307)

## 2024-08-13 LAB — IRON AND TIBC
Iron: 64 ug/dL (ref 28–170)
Saturation Ratios: 34 % — ABNORMAL HIGH (ref 10.4–31.8)
TIBC: 189 ug/dL — ABNORMAL LOW (ref 250–450)
UIBC: 125 ug/dL

## 2024-08-13 LAB — POCT HEMOGLOBIN-HEMACUE: Hemoglobin: 11.1 g/dL — ABNORMAL LOW (ref 12.0–15.0)

## 2024-08-13 MED ORDER — EPOETIN ALFA-EPBX 10000 UNIT/ML IJ SOLN
INTRAMUSCULAR | Status: AC
  Filled 2024-08-13: qty 2

## 2024-08-13 MED ORDER — EPOETIN ALFA-EPBX 10000 UNIT/ML IJ SOLN
20000.0000 [IU] | INTRAMUSCULAR | Status: DC
  Administered 2024-08-13: 20000 [IU] via SUBCUTANEOUS

## 2024-08-22 ENCOUNTER — Telehealth: Payer: Self-pay | Admitting: Pharmacy Technician

## 2024-08-22 NOTE — Telephone Encounter (Signed)
 Auth Submission: NO AUTH NEEDED Site of care: Site of care: [facility] Payer: [employer] DUAL Medication & CPT/J Code(s) submitted: RETACRIT  [id number] Diagnosis Code: N18.39 Route of submission (phone, fax, portal):  Phone # Fax # Auth type: Buy/Bill HB Units/visits requested:  Reference number:  Approval from: 05/28/24 to 12/17/24

## 2024-08-24 ENCOUNTER — Other Ambulatory Visit (HOSPITAL_COMMUNITY): Payer: Self-pay | Admitting: Nephrology

## 2024-08-24 DIAGNOSIS — D631 Anemia in chronic kidney disease: Secondary | ICD-10-CM | POA: Insufficient documentation

## 2024-08-27 ENCOUNTER — Inpatient Hospital Stay (HOSPITAL_COMMUNITY): Admission: RE | Admit: 2024-08-27 | Source: Ambulatory Visit

## 2024-08-27 ENCOUNTER — Encounter (HOSPITAL_COMMUNITY)
Admission: RE | Admit: 2024-08-27 | Discharge: 2024-08-27 | Disposition: A | Discharge status: Home / Self Care | Source: Ambulatory Visit | Attending: Nephrology | Admitting: Nephrology

## 2024-08-27 VITALS — BP 158/67 | HR 73 | Temp 97.0°F | Resp 16

## 2024-08-27 DIAGNOSIS — N185 Chronic kidney disease, stage 5: Secondary | ICD-10-CM | POA: Diagnosis present

## 2024-08-27 DIAGNOSIS — D631 Anemia in chronic kidney disease: Secondary | ICD-10-CM | POA: Insufficient documentation

## 2024-08-27 LAB — POCT HEMOGLOBIN-HEMACUE: Hemoglobin: 9.7 g/dL — ABNORMAL LOW (ref 12.0–15.0)

## 2024-08-27 MED ORDER — EPOETIN ALFA-EPBX 20000 UNIT/ML IJ SOLN
INTRAMUSCULAR | Status: AC
  Filled 2024-08-27: qty 1

## 2024-08-27 MED ORDER — EPOETIN ALFA-EPBX 20000 UNIT/ML IJ SOLN
20000.0000 [IU] | Freq: Once | INTRAMUSCULAR | Status: AC
  Administered 2024-08-27: 20000 [IU] via SUBCUTANEOUS

## 2024-09-10 ENCOUNTER — Encounter (HOSPITAL_COMMUNITY)
Admission: RE | Admit: 2024-09-10 | Discharge: 2024-09-10 | Disposition: A | Discharge status: Home / Self Care | Source: Ambulatory Visit | Attending: Nephrology

## 2024-09-10 ENCOUNTER — Encounter (HOSPITAL_COMMUNITY)

## 2024-09-10 VITALS — BP 156/68 | HR 59 | Temp 97.7°F | Resp 16

## 2024-09-10 DIAGNOSIS — N185 Chronic kidney disease, stage 5: Secondary | ICD-10-CM | POA: Diagnosis not present

## 2024-09-10 DIAGNOSIS — D631 Anemia in chronic kidney disease: Secondary | ICD-10-CM

## 2024-09-10 LAB — FERRITIN: Ferritin: 192 ng/mL (ref 11–307)

## 2024-09-10 LAB — IRON AND TIBC
Iron: 81 ug/dL (ref 28–170)
Saturation Ratios: 39 % — ABNORMAL HIGH (ref 10.4–31.8)
TIBC: 209 ug/dL — ABNORMAL LOW (ref 250–450)
UIBC: 128 ug/dL

## 2024-09-10 LAB — POCT HEMOGLOBIN-HEMACUE: Hemoglobin: 10.4 g/dL — ABNORMAL LOW (ref 12.0–15.0)

## 2024-09-10 MED ORDER — EPOETIN ALFA-EPBX 20000 UNIT/ML IJ SOLN
INTRAMUSCULAR | Status: AC
  Filled 2024-09-10: qty 1

## 2024-09-10 MED ORDER — EPOETIN ALFA-EPBX 20000 UNIT/ML IJ SOLN
20000.0000 [IU] | Freq: Once | INTRAMUSCULAR | Status: AC
  Administered 2024-09-10: 20000 [IU] via SUBCUTANEOUS

## 2024-09-24 ENCOUNTER — Encounter (HOSPITAL_COMMUNITY)
Admission: RE | Admit: 2024-09-24 | Discharge: 2024-09-24 | Disposition: A | Discharge status: Home / Self Care | Source: Ambulatory Visit | Attending: Nephrology | Admitting: Nephrology

## 2024-09-24 VITALS — BP 176/68 | HR 59 | Temp 97.3°F | Resp 16

## 2024-09-24 DIAGNOSIS — N185 Chronic kidney disease, stage 5: Secondary | ICD-10-CM | POA: Insufficient documentation

## 2024-09-24 DIAGNOSIS — D631 Anemia in chronic kidney disease: Secondary | ICD-10-CM | POA: Insufficient documentation

## 2024-09-24 LAB — POCT HEMOGLOBIN-HEMACUE: Hemoglobin: 11 g/dL — ABNORMAL LOW (ref 12.0–15.0)

## 2024-09-24 MED ORDER — EPOETIN ALFA-EPBX 20000 UNIT/ML IJ SOLN
20000.0000 [IU] | Freq: Once | INTRAMUSCULAR | Status: AC
  Administered 2024-09-24: 20000 [IU] via SUBCUTANEOUS

## 2024-09-24 MED ORDER — EPOETIN ALFA-EPBX 20000 UNIT/ML IJ SOLN
INTRAMUSCULAR | Status: AC
  Filled 2024-09-24: qty 1

## 2024-10-08 ENCOUNTER — Encounter (HOSPITAL_COMMUNITY)
Admission: RE | Admit: 2024-10-08 | Discharge: 2024-10-08 | Disposition: A | Discharge status: Home / Self Care | Source: Ambulatory Visit | Attending: Nephrology | Admitting: Nephrology

## 2024-10-08 VITALS — BP 176/74 | HR 69 | Temp 97.2°F | Resp 16

## 2024-10-08 DIAGNOSIS — D631 Anemia in chronic kidney disease: Secondary | ICD-10-CM

## 2024-10-08 DIAGNOSIS — N185 Chronic kidney disease, stage 5: Secondary | ICD-10-CM | POA: Diagnosis not present

## 2024-10-08 LAB — IRON AND TIBC
Iron: 90 ug/dL (ref 28–170)
Saturation Ratios: 49 % — ABNORMAL HIGH (ref 10.4–31.8)
TIBC: 185 ug/dL — ABNORMAL LOW (ref 250–450)
UIBC: 95 ug/dL

## 2024-10-08 LAB — POCT HEMOGLOBIN-HEMACUE: Hemoglobin: 9.9 g/dL — ABNORMAL LOW (ref 12.0–15.0)

## 2024-10-08 LAB — FERRITIN: Ferritin: 281 ng/mL (ref 11–307)

## 2024-10-08 MED ORDER — CLONIDINE HCL 0.1 MG PO TABS
0.1000 mg | ORAL_TABLET | ORAL | Status: DC | PRN
  Administered 2024-10-08: 0.1 mg via ORAL

## 2024-10-08 MED ORDER — EPOETIN ALFA-EPBX 20000 UNIT/ML IJ SOLN
20000.0000 [IU] | Freq: Once | INTRAMUSCULAR | Status: AC
  Administered 2024-10-08: 20000 [IU] via SUBCUTANEOUS

## 2024-10-08 MED ORDER — EPOETIN ALFA-EPBX 20000 UNIT/ML IJ SOLN
INTRAMUSCULAR | Status: AC
  Filled 2024-10-08: qty 1

## 2024-10-08 MED ORDER — CLONIDINE HCL 0.1 MG PO TABS
ORAL_TABLET | ORAL | Status: AC
  Filled 2024-10-08: qty 1

## 2024-10-21 ENCOUNTER — Encounter (HOSPITAL_COMMUNITY): Payer: Self-pay

## 2024-10-21 ENCOUNTER — Inpatient Hospital Stay (HOSPITAL_COMMUNITY)

## 2024-10-21 ENCOUNTER — Emergency Department (HOSPITAL_COMMUNITY)

## 2024-10-21 ENCOUNTER — Inpatient Hospital Stay (HOSPITAL_COMMUNITY)
Admission: EM | Admit: 2024-10-21 | Discharge: 2024-11-01 | DRG: 264 | Disposition: A | Discharge status: Home / Self Care | Attending: Internal Medicine | Admitting: Internal Medicine

## 2024-10-21 DIAGNOSIS — I255 Ischemic cardiomyopathy: Secondary | ICD-10-CM | POA: Diagnosis present

## 2024-10-21 DIAGNOSIS — E43 Unspecified severe protein-calorie malnutrition: Secondary | ICD-10-CM | POA: Diagnosis present

## 2024-10-21 DIAGNOSIS — Z7982 Long term (current) use of aspirin: Secondary | ICD-10-CM

## 2024-10-21 DIAGNOSIS — E872 Acidosis, unspecified: Secondary | ICD-10-CM | POA: Diagnosis not present

## 2024-10-21 DIAGNOSIS — G9340 Encephalopathy, unspecified: Secondary | ICD-10-CM | POA: Diagnosis not present

## 2024-10-21 DIAGNOSIS — Z7189 Other specified counseling: Secondary | ICD-10-CM | POA: Diagnosis not present

## 2024-10-21 DIAGNOSIS — J9601 Acute respiratory failure with hypoxia: Secondary | ICD-10-CM | POA: Diagnosis present

## 2024-10-21 DIAGNOSIS — I5021 Acute systolic (congestive) heart failure: Secondary | ICD-10-CM | POA: Diagnosis present

## 2024-10-21 DIAGNOSIS — Z91148 Patient's other noncompliance with medication regimen for other reason: Secondary | ICD-10-CM

## 2024-10-21 DIAGNOSIS — I5020 Unspecified systolic (congestive) heart failure: Secondary | ICD-10-CM | POA: Diagnosis present

## 2024-10-21 DIAGNOSIS — Z66 Do not resuscitate: Secondary | ICD-10-CM

## 2024-10-21 DIAGNOSIS — N185 Chronic kidney disease, stage 5: Secondary | ICD-10-CM | POA: Diagnosis not present

## 2024-10-21 DIAGNOSIS — E876 Hypokalemia: Secondary | ICD-10-CM | POA: Diagnosis not present

## 2024-10-21 DIAGNOSIS — Z8639 Personal history of other endocrine, nutritional and metabolic disease: Secondary | ICD-10-CM | POA: Insufficient documentation

## 2024-10-21 DIAGNOSIS — Z803 Family history of malignant neoplasm of breast: Secondary | ICD-10-CM

## 2024-10-21 DIAGNOSIS — Z90710 Acquired absence of both cervix and uterus: Secondary | ICD-10-CM

## 2024-10-21 DIAGNOSIS — Z87891 Personal history of nicotine dependence: Secondary | ICD-10-CM | POA: Diagnosis not present

## 2024-10-21 DIAGNOSIS — I161 Hypertensive emergency: Secondary | ICD-10-CM | POA: Diagnosis present

## 2024-10-21 DIAGNOSIS — Z79899 Other long term (current) drug therapy: Secondary | ICD-10-CM

## 2024-10-21 DIAGNOSIS — E1122 Type 2 diabetes mellitus with diabetic chronic kidney disease: Secondary | ICD-10-CM | POA: Diagnosis present

## 2024-10-21 DIAGNOSIS — N184 Chronic kidney disease, stage 4 (severe): Secondary | ICD-10-CM | POA: Diagnosis present

## 2024-10-21 DIAGNOSIS — Z1152 Encounter for screening for COVID-19: Secondary | ICD-10-CM

## 2024-10-21 DIAGNOSIS — Z888 Allergy status to other drugs, medicaments and biological substances status: Secondary | ICD-10-CM

## 2024-10-21 DIAGNOSIS — Z8781 Personal history of (healed) traumatic fracture: Secondary | ICD-10-CM

## 2024-10-21 DIAGNOSIS — F32A Depression, unspecified: Secondary | ICD-10-CM | POA: Diagnosis present

## 2024-10-21 DIAGNOSIS — Z681 Body mass index (BMI) 19 or less, adult: Secondary | ICD-10-CM | POA: Diagnosis not present

## 2024-10-21 DIAGNOSIS — E874 Mixed disorder of acid-base balance: Secondary | ICD-10-CM | POA: Diagnosis present

## 2024-10-21 DIAGNOSIS — I120 Hypertensive chronic kidney disease with stage 5 chronic kidney disease or end stage renal disease: Secondary | ICD-10-CM | POA: Diagnosis not present

## 2024-10-21 DIAGNOSIS — D631 Anemia in chronic kidney disease: Secondary | ICD-10-CM | POA: Diagnosis present

## 2024-10-21 DIAGNOSIS — R64 Cachexia: Secondary | ICD-10-CM | POA: Diagnosis present

## 2024-10-21 DIAGNOSIS — E1142 Type 2 diabetes mellitus with diabetic polyneuropathy: Secondary | ICD-10-CM | POA: Diagnosis present

## 2024-10-21 DIAGNOSIS — D696 Thrombocytopenia, unspecified: Secondary | ICD-10-CM | POA: Diagnosis present

## 2024-10-21 DIAGNOSIS — I493 Ventricular premature depolarization: Secondary | ICD-10-CM | POA: Diagnosis present

## 2024-10-21 DIAGNOSIS — N19 Unspecified kidney failure: Secondary | ICD-10-CM | POA: Diagnosis present

## 2024-10-21 DIAGNOSIS — I21A1 Myocardial infarction type 2: Secondary | ICD-10-CM | POA: Diagnosis present

## 2024-10-21 DIAGNOSIS — E119 Type 2 diabetes mellitus without complications: Secondary | ICD-10-CM

## 2024-10-21 DIAGNOSIS — M1990 Unspecified osteoarthritis, unspecified site: Secondary | ICD-10-CM | POA: Diagnosis present

## 2024-10-21 DIAGNOSIS — M898X9 Other specified disorders of bone, unspecified site: Secondary | ICD-10-CM | POA: Diagnosis present

## 2024-10-21 DIAGNOSIS — R54 Age-related physical debility: Secondary | ICD-10-CM | POA: Diagnosis present

## 2024-10-21 DIAGNOSIS — Z853 Personal history of malignant neoplasm of breast: Secondary | ICD-10-CM | POA: Diagnosis not present

## 2024-10-21 DIAGNOSIS — I132 Hypertensive heart and chronic kidney disease with heart failure and with stage 5 chronic kidney disease, or end stage renal disease: Secondary | ICD-10-CM | POA: Diagnosis present

## 2024-10-21 DIAGNOSIS — N186 End stage renal disease: Secondary | ICD-10-CM | POA: Diagnosis present

## 2024-10-21 DIAGNOSIS — Z992 Dependence on renal dialysis: Secondary | ICD-10-CM | POA: Diagnosis not present

## 2024-10-21 DIAGNOSIS — G4700 Insomnia, unspecified: Secondary | ICD-10-CM | POA: Diagnosis present

## 2024-10-21 DIAGNOSIS — E7800 Pure hypercholesterolemia, unspecified: Secondary | ICD-10-CM | POA: Diagnosis present

## 2024-10-21 DIAGNOSIS — Z8249 Family history of ischemic heart disease and other diseases of the circulatory system: Secondary | ICD-10-CM

## 2024-10-21 DIAGNOSIS — J9600 Acute respiratory failure, unspecified whether with hypoxia or hypercapnia: Secondary | ICD-10-CM | POA: Diagnosis not present

## 2024-10-21 DIAGNOSIS — Z801 Family history of malignant neoplasm of trachea, bronchus and lung: Secondary | ICD-10-CM

## 2024-10-21 DIAGNOSIS — J810 Acute pulmonary edema: Secondary | ICD-10-CM

## 2024-10-21 DIAGNOSIS — Z8542 Personal history of malignant neoplasm of other parts of uterus: Secondary | ICD-10-CM

## 2024-10-21 DIAGNOSIS — I160 Hypertensive urgency: Secondary | ICD-10-CM | POA: Diagnosis not present

## 2024-10-21 DIAGNOSIS — N179 Acute kidney failure, unspecified: Secondary | ICD-10-CM | POA: Diagnosis present

## 2024-10-21 DIAGNOSIS — Z91158 Patient's noncompliance with renal dialysis for other reason: Secondary | ICD-10-CM

## 2024-10-21 DIAGNOSIS — I214 Non-ST elevation (NSTEMI) myocardial infarction: Secondary | ICD-10-CM

## 2024-10-21 DIAGNOSIS — G9341 Metabolic encephalopathy: Secondary | ICD-10-CM | POA: Diagnosis present

## 2024-10-21 DIAGNOSIS — Z515 Encounter for palliative care: Secondary | ICD-10-CM | POA: Diagnosis not present

## 2024-10-21 DIAGNOSIS — Z8711 Personal history of peptic ulcer disease: Secondary | ICD-10-CM

## 2024-10-21 DIAGNOSIS — R7989 Other specified abnormal findings of blood chemistry: Secondary | ICD-10-CM | POA: Diagnosis present

## 2024-10-21 DIAGNOSIS — Z800 Family history of malignant neoplasm of digestive organs: Secondary | ICD-10-CM

## 2024-10-21 DIAGNOSIS — R4182 Altered mental status, unspecified: Secondary | ICD-10-CM | POA: Diagnosis not present

## 2024-10-21 LAB — I-STAT VENOUS BLOOD GAS, ED
Acid-base deficit: 13 mmol/L — ABNORMAL HIGH (ref 0.0–2.0)
Bicarbonate: 12.3 mmol/L — ABNORMAL LOW (ref 20.0–28.0)
Calcium, Ion: 1.09 mmol/L — ABNORMAL LOW (ref 1.15–1.40)
HCT: 28 % — ABNORMAL LOW (ref 36.0–46.0)
Hemoglobin: 9.5 g/dL — ABNORMAL LOW (ref 12.0–15.0)
O2 Saturation: 98 %
Potassium: 4.1 mmol/L (ref 3.5–5.1)
Sodium: 146 mmol/L — ABNORMAL HIGH (ref 135–145)
TCO2: 13 mmol/L — ABNORMAL LOW (ref 22–32)
pCO2, Ven: 26.3 mmHg — ABNORMAL LOW (ref 44–60)
pH, Ven: 7.278 (ref 7.25–7.43)
pO2, Ven: 115 mmHg — ABNORMAL HIGH (ref 32–45)

## 2024-10-21 LAB — CBC WITH DIFFERENTIAL/PLATELET
Abs Immature Granulocytes: 0.05 K/uL (ref 0.00–0.07)
Basophils Absolute: 0 K/uL (ref 0.0–0.1)
Basophils Relative: 0 %
Eosinophils Absolute: 0 K/uL (ref 0.0–0.5)
Eosinophils Relative: 0 %
HCT: 30 % — ABNORMAL LOW (ref 36.0–46.0)
Hemoglobin: 9.4 g/dL — ABNORMAL LOW (ref 12.0–15.0)
Immature Granulocytes: 1 %
Lymphocytes Relative: 6 %
Lymphs Abs: 0.4 K/uL — ABNORMAL LOW (ref 0.7–4.0)
MCH: 30.9 pg (ref 26.0–34.0)
MCHC: 31.3 g/dL (ref 30.0–36.0)
MCV: 98.7 fL (ref 80.0–100.0)
Monocytes Absolute: 0.7 K/uL (ref 0.1–1.0)
Monocytes Relative: 11 %
Neutro Abs: 5.4 K/uL (ref 1.7–7.7)
Neutrophils Relative %: 82 %
Platelets: 120 K/uL — ABNORMAL LOW (ref 150–400)
RBC: 3.04 MIL/uL — ABNORMAL LOW (ref 3.87–5.11)
RDW: 18.5 % — ABNORMAL HIGH (ref 11.5–15.5)
WBC: 6.6 K/uL (ref 4.0–10.5)
nRBC: 0 % (ref 0.0–0.2)

## 2024-10-21 LAB — ECHOCARDIOGRAM COMPLETE
Area-P 1/2: 5.13 cm2
Calc EF: 52 %
MV M vel: 6.89 m/s
MV Peak grad: 189.9 mmHg
Radius: 0.5 cm
S' Lateral: 4.2 cm
Single Plane A2C EF: 51 %
Single Plane A4C EF: 51.4 %

## 2024-10-21 LAB — HEPATITIS B SURFACE ANTIGEN: Hepatitis B Surface Ag: NONREACTIVE

## 2024-10-21 LAB — I-STAT ARTERIAL BLOOD GAS, ED
Acid-base deficit: 13 mmol/L — ABNORMAL HIGH (ref 0.0–2.0)
Bicarbonate: 13 mmol/L — ABNORMAL LOW (ref 20.0–28.0)
Calcium, Ion: 1.18 mmol/L (ref 1.15–1.40)
HCT: 27 % — ABNORMAL LOW (ref 36.0–46.0)
Hemoglobin: 9.2 g/dL — ABNORMAL LOW (ref 12.0–15.0)
O2 Saturation: 100 %
Patient temperature: 98.9
Potassium: 4.1 mmol/L (ref 3.5–5.1)
Sodium: 146 mmol/L — ABNORMAL HIGH (ref 135–145)
TCO2: 14 mmol/L — ABNORMAL LOW (ref 22–32)
pCO2 arterial: 31.1 mmHg — ABNORMAL LOW (ref 32–48)
pH, Arterial: 7.231 — ABNORMAL LOW (ref 7.35–7.45)
pO2, Arterial: 204 mmHg — ABNORMAL HIGH (ref 83–108)

## 2024-10-21 LAB — COMPREHENSIVE METABOLIC PANEL WITH GFR
ALT: 24 U/L (ref 0–44)
AST: 40 U/L (ref 15–41)
Albumin: 3.3 g/dL — ABNORMAL LOW (ref 3.5–5.0)
Alkaline Phosphatase: 45 U/L (ref 38–126)
Anion gap: 17 — ABNORMAL HIGH (ref 5–15)
BUN: 137 mg/dL — ABNORMAL HIGH (ref 8–23)
CO2: 13 mmol/L — ABNORMAL LOW (ref 22–32)
Calcium: 8.3 mg/dL — ABNORMAL LOW (ref 8.9–10.3)
Chloride: 115 mmol/L — ABNORMAL HIGH (ref 98–111)
Creatinine, Ser: 10.15 mg/dL — ABNORMAL HIGH (ref 0.44–1.00)
GFR, Estimated: 4 mL/min — ABNORMAL LOW (ref >60)
Glucose, Bld: 169 mg/dL — ABNORMAL HIGH (ref 70–99)
Potassium: 4.1 mmol/L (ref 3.5–5.1)
Sodium: 145 mmol/L (ref 135–145)
Total Bilirubin: 0.9 mg/dL (ref 0.0–1.2)
Total Protein: 6.6 g/dL (ref 6.5–8.1)

## 2024-10-21 LAB — CBG MONITORING, ED: Glucose-Capillary: 126 mg/dL — ABNORMAL HIGH (ref 70–99)

## 2024-10-21 LAB — TYPE AND SCREEN
ABO/RH(D): O POS
Antibody Screen: NEGATIVE

## 2024-10-21 LAB — GLUCOSE, CAPILLARY
Glucose-Capillary: 122 mg/dL — ABNORMAL HIGH (ref 70–99)
Glucose-Capillary: 130 mg/dL — ABNORMAL HIGH (ref 70–99)

## 2024-10-21 LAB — BRAIN NATRIURETIC PEPTIDE: B Natriuretic Peptide: 2214.4 pg/mL — ABNORMAL HIGH (ref 0.0–100.0)

## 2024-10-21 LAB — RESP PANEL BY RT-PCR (RSV, FLU A&B, COVID)  RVPGX2
Influenza A by PCR: NEGATIVE
Influenza B by PCR: NEGATIVE
Resp Syncytial Virus by PCR: NEGATIVE
SARS Coronavirus 2 by RT PCR: NEGATIVE

## 2024-10-21 LAB — TROPONIN I (HIGH SENSITIVITY)
Troponin I (High Sensitivity): 198 ng/L (ref <18)
Troponin I (High Sensitivity): 408 ng/L (ref <18)

## 2024-10-21 LAB — MRSA NEXT GEN BY PCR, NASAL: MRSA by PCR Next Gen: NOT DETECTED

## 2024-10-21 MED ORDER — HYDRALAZINE HCL 50 MG PO TABS
100.0000 mg | ORAL_TABLET | Freq: Two times a day (BID) | ORAL | Status: DC
  Administered 2024-10-22 – 2024-10-24 (×4): 100 mg via ORAL
  Filled 2024-10-21 (×4): qty 2

## 2024-10-21 MED ORDER — FUROSEMIDE 10 MG/ML IJ SOLN
80.0000 mg | Freq: Two times a day (BID) | INTRAMUSCULAR | Status: DC
  Administered 2024-10-21 – 2024-10-26 (×10): 80 mg via INTRAVENOUS
  Filled 2024-10-21 (×10): qty 8

## 2024-10-21 MED ORDER — SENNOSIDES-DOCUSATE SODIUM 8.6-50 MG PO TABS: 1.0000 | ORAL_TABLET | Freq: Every evening | ORAL | Status: DC | PRN

## 2024-10-21 MED ORDER — FUROSEMIDE 10 MG/ML IJ SOLN
80.0000 mg | INTRAMUSCULAR | Status: AC
  Administered 2024-10-21: 80 mg via INTRAVENOUS
  Filled 2024-10-21: qty 8

## 2024-10-21 MED ORDER — FUROSEMIDE 10 MG/ML IJ SOLN
40.0000 mg | INTRAMUSCULAR | Status: AC
  Administered 2024-10-21: 40 mg via INTRAVENOUS
  Filled 2024-10-21: qty 4

## 2024-10-21 MED ORDER — PANTOPRAZOLE SODIUM 40 MG PO TBEC
40.0000 mg | DELAYED_RELEASE_TABLET | Freq: Every day | ORAL | Status: DC
  Administered 2024-10-23 – 2024-11-01 (×10): 40 mg via ORAL
  Filled 2024-10-21 (×10): qty 1

## 2024-10-21 MED ORDER — ALBUTEROL SULFATE (2.5 MG/3ML) 0.083% IN NEBU: 2.5000 mg | INHALATION_SOLUTION | RESPIRATORY_TRACT | Status: DC | PRN

## 2024-10-21 MED ORDER — INSULIN ASPART 100 UNIT/ML IJ SOLN
0.0000 [IU] | INTRAMUSCULAR | Status: DC
  Filled 2024-10-21: qty 10

## 2024-10-21 MED ORDER — ATORVASTATIN CALCIUM 40 MG PO TABS: 40.0000 mg | ORAL_TABLET | Freq: Every day | ORAL | Status: DC

## 2024-10-21 MED ORDER — ACETAMINOPHEN 650 MG RE SUPP: 650.0000 mg | Freq: Four times a day (QID) | RECTAL | Status: DC | PRN

## 2024-10-21 MED ORDER — HEPARIN SODIUM (PORCINE) 1000 UNIT/ML DIALYSIS
2800.0000 [IU] | Freq: Once | INTRAMUSCULAR | Status: AC
  Administered 2024-10-21: 2800 [IU]
  Filled 2024-10-21 (×2): qty 3

## 2024-10-21 MED ORDER — METOPROLOL SUCCINATE ER 25 MG PO TB24
25.0000 mg | ORAL_TABLET | Freq: Every day | ORAL | Status: DC
  Administered 2024-10-22 – 2024-11-01 (×10): 25 mg via ORAL
  Filled 2024-10-21 (×11): qty 1

## 2024-10-21 MED ORDER — CHLORHEXIDINE GLUCONATE CLOTH 2 % EX PADS
6.0000 | MEDICATED_PAD | Freq: Every day | CUTANEOUS | Status: DC
  Administered 2024-10-22 – 2024-10-27 (×5): 6 via TOPICAL

## 2024-10-21 MED ORDER — LORAZEPAM 2 MG/ML IJ SOLN
0.5000 mg | INTRAMUSCULAR | Status: AC
  Administered 2024-10-21: 0.5 mg via INTRAVENOUS
  Filled 2024-10-21: qty 1

## 2024-10-21 MED ORDER — SODIUM CHLORIDE 0.9% FLUSH
3.0000 mL | Freq: Two times a day (BID) | INTRAVENOUS | Status: DC
  Administered 2024-10-21 – 2024-11-01 (×21): 3 mL via INTRAVENOUS

## 2024-10-21 MED ORDER — FENTANYL CITRATE (PF) 50 MCG/ML IJ SOSY
25.0000 ug | PREFILLED_SYRINGE | INTRAMUSCULAR | Status: DC | PRN
Start: 2024-10-21 — End: 2024-11-01
  Administered 2024-10-21: 25 ug via INTRAVENOUS
  Filled 2024-10-21: qty 1

## 2024-10-21 MED ORDER — AMLODIPINE BESYLATE 10 MG PO TABS
10.0000 mg | ORAL_TABLET | Freq: Every day | ORAL | Status: DC
  Administered 2024-10-22 – 2024-11-01 (×10): 10 mg via ORAL
  Filled 2024-10-21 (×11): qty 1

## 2024-10-21 MED ORDER — HEPARIN SODIUM (PORCINE) 5000 UNIT/ML IJ SOLN
5000.0000 [IU] | Freq: Three times a day (TID) | INTRAMUSCULAR | Status: DC
  Administered 2024-10-21 – 2024-10-22 (×2): 5000 [IU] via SUBCUTANEOUS
  Filled 2024-10-21 (×2): qty 1

## 2024-10-21 MED ORDER — FERROUS SULFATE 325 (65 FE) MG PO TABS
325.0000 mg | ORAL_TABLET | Freq: Every day | ORAL | Status: DC
  Administered 2024-10-23 – 2024-11-01 (×10): 325 mg via ORAL
  Filled 2024-10-21 (×12): qty 1

## 2024-10-21 MED ORDER — CALCITRIOL 0.25 MCG PO CAPS
0.2500 ug | ORAL_CAPSULE | Freq: Every day | ORAL | Status: DC
  Administered 2024-10-23 – 2024-11-01 (×10): 0.25 ug via ORAL
  Filled 2024-10-21 (×12): qty 1

## 2024-10-21 MED ORDER — NITROGLYCERIN IN D5W 200-5 MCG/ML-% IV SOLN
0.0000 ug/min | INTRAVENOUS | Status: DC
  Administered 2024-10-21: 5 ug/min via INTRAVENOUS
  Filled 2024-10-21: qty 250

## 2024-10-21 MED ORDER — HEPARIN SODIUM (PORCINE) 5000 UNIT/ML IJ SOLN
5000.0000 [IU] | Freq: Three times a day (TID) | INTRAMUSCULAR | Status: DC
  Administered 2024-10-21: 5000 [IU] via SUBCUTANEOUS
  Filled 2024-10-21: qty 1

## 2024-10-21 MED ORDER — ACETAMINOPHEN 325 MG PO TABS
650.0000 mg | ORAL_TABLET | Freq: Four times a day (QID) | ORAL | Status: DC | PRN
  Administered 2024-10-22 – 2024-10-23 (×2): 650 mg via ORAL
  Filled 2024-10-21 (×2): qty 2

## 2024-10-21 MED ORDER — FUROSEMIDE 10 MG/ML IJ SOLN
40.0000 mg | Freq: Once | INTRAMUSCULAR | Status: AC
  Administered 2024-10-21: 40 mg via INTRAVENOUS
  Filled 2024-10-21: qty 4

## 2024-10-21 MED ORDER — CYCLOSPORINE 0.05 % OP EMUL
1.0000 [drp] | Freq: Every day | OPHTHALMIC | Status: DC
  Administered 2024-10-22 – 2024-11-01 (×11): 1 [drp] via OPHTHALMIC
  Filled 2024-10-21 (×11): qty 30

## 2024-10-21 MED ORDER — NITROGLYCERIN IN D5W 200-5 MCG/ML-% IV SOLN
0.0000 ug/min | INTRAVENOUS | Status: DC
  Administered 2024-10-21: 10 ug/min via INTRAVENOUS
  Filled 2024-10-21: qty 250

## 2024-10-21 MED ORDER — SERTRALINE HCL 100 MG PO TABS: 100.0000 mg | ORAL_TABLET | ORAL | Status: DC

## 2024-10-21 NOTE — Progress Notes (Signed)
  Echocardiogram 2D Echocardiogram has been performed.  Norleen ORN Tyia Binford 10/21/2024, 2:27 PM

## 2024-10-21 NOTE — Progress Notes (Incomplete)
 RT obtained ABG and

## 2024-10-21 NOTE — ED Notes (Signed)
 NP at bedside for bedside procedure. Consent form signed by patient's daughter.

## 2024-10-21 NOTE — ED Notes (Signed)
 Patient has ripped off bipap, is at edge of bed, increasingly agitated. Pt difficult to redirect. Bipap mask put back on patient. Smith MD notified of situation/abnormal heart rate. Pt assisted back into bed.

## 2024-10-21 NOTE — ED Triage Notes (Signed)
 Pt BIB [facility] from home with c/o [facility] throughout the night, daughter checked on her this morning, SHOB worsened, called EMS. Per family pt has been noncompliant with her lasix  and dialysis.   1 nitroglycerin PTA.   240/120

## 2024-10-21 NOTE — H&P (Addendum)
 History and Physical    Patient: Erica Whitney FMW:[id number] DOB: [birth year] DOA: 10/21/2024 DOS: the patient was seen and examined on 10/21/2024 PCP: [facility]  Patient coming from: Home  Chief Complaint:  Chief Complaint  Patient presents with   Shortness of Breath   HPI: Erica Whitney is a 83 y.o. female with medical history significant of hypertension, hyperlipidemia, ESRD on HD, diabetes mellitus type 2, breast cancer, uterine cancer, depression presents with difficulty breathing and right-sided pain. She is accompanied by her daughter who provides most history.  She experienced sudden onset of difficulty breathing this morning, which was noticed by her daughter during a routine visit. She was not on oxygen prior to this incident, but when the oxygen was removed, she struggled with breathing and seemed disoriented.  She reports right-sided pain but denies any chest pain. The symptoms began suddenly, as she was reportedly fine the previous night.  She has a history of kidney failure and is not on dialysis, though she still produces some urine. Her creatinine level is currently at 10.  She has diabetes but is not currently on insulin  or any other diabetes medication. Her daughter mentioned that she was not having issues with diabetes recently, focusing more on her kidney function.  She receives infusion shots every two weeks, with the next one scheduled for tomorrow. Patient's daughter notes that she has not been taking medications as she should always.  In the emergency department patient was noted to be afebrile with blood pressures elevated up to 226/115, respirations 15-26, and all other vital signs maintained.  Labs significant for BNP 2214.4, high-sensitivity troponin 198, hemoglobin 9.4, platelets 120, CO2 13, BUN 137, creatinine 10.15, anion gap 17, and calcium  8.3. Chest x-ray revealed diffuse interstitial and hazy airspace opacities throughout the lungs  compatible with interstitial edema however superimposed infection cannot be excluded and mild cardiomegaly.  Patient had been given furosemide  40 mg IV and started temporarily on a nitroglycerin drip but had significant drop in blood pressure for which it was discontinued.  Review of Systems: unable to review all systems due to the inability of the patient to answer questions. 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   Chronic kidney disease (CKD), stage IV (severe) (HCC)    Depression    Diabetes mellitus    Hypercholesteremia    Hypertension    Insomnia    Peptic ulcer    Uterine cancer (HCC)    dx in her 52s   Wears glasses    Past Surgical History:  Procedure Laterality Date   ABDOMINAL HYSTERECTOMY  1995   BACK SURGERY  2000   lumb lam   BIOPSY  05/14/2021   Procedure: BIOPSY;  Surgeon: Legrand Victory LITTIE DOUGLAS, MD;  Location: [facility] ENDOSCOPY;  Service: Gastroenterology;;   BREAST LUMPECTOMY Right 2015   COLONOSCOPY     ESOPHAGOGASTRODUODENOSCOPY N/A 05/14/2021   Procedure: ESOPHAGOGASTRODUODENOSCOPY (EGD);  Surgeon: Legrand Victory LITTIE DOUGLAS, MD;  Location: [facility] ENDOSCOPY;  Service: Gastroenterology;  Laterality: N/A;   ESOPHAGOGASTRODUODENOSCOPY (EGD) WITH PROPOFOL  N/A 05/26/2021   Procedure: ESOPHAGOGASTRODUODENOSCOPY (EGD) WITH PROPOFOL ;  Surgeon: Rollin Dover, MD;  Location: [facility] ENDOSCOPY;  Service: Endoscopy;  Laterality: N/A;   HEMOSTASIS CLIP PLACEMENT  05/26/2021   Procedure: HEMOSTASIS CLIP PLACEMENT;  Surgeon: Rollin Dover, MD;  Location: [facility]  ENDOSCOPY;  Service: Endoscopy;;   HEMOSTASIS CONTROL  05/14/2021   Procedure: HEMOSTASIS CONTROL;  Surgeon: Legrand Victory LITTIE DOUGLAS, MD;  Location: [facility] ENDOSCOPY;  Service: Gastroenterology;;   HOT HEMOSTASIS N/A 05/14/2021   Procedure: HOT HEMOSTASIS (ARGON PLASMA COAGULATION/BICAP);  Surgeon: Legrand Victory LITTIE DOUGLAS, MD;  Location: [facility] ENDOSCOPY;  Service: Gastroenterology;  Laterality: N/A;   HOT HEMOSTASIS N/A 05/26/2021   Procedure: HOT HEMOSTASIS (ARGON PLASMA COAGULATION/BICAP);  Surgeon: Rollin Dover, MD;  Location: [facility] ENDOSCOPY;  Service: Endoscopy;  Laterality: N/A;   ORIF METACARPAL FRACTURE  2012   left   RADIOACTIVE SEED GUIDED PARTIAL MASTECTOMY WITH AXILLARY SENTINEL LYMPH NODE BIOPSY Right 12/17/2014   Procedure: RIGHT BREAST RADIOACTIVE SEED LOCALIZED LUMPECTOMY AND SENTINEL NODE MAPPING;  Surgeon: Deward Null III, MD;  Location: [facility];  Service: General;  Laterality: Right;   SCLEROTHERAPY  05/26/2021   Procedure: MATIAS;  Surgeon: Rollin Dover, MD;  Location: [facility] ENDOSCOPY;  Service: Endoscopy;;   Social History:  reports that she quit smoking about 12 years ago. Her smoking use included cigarettes. She has quit using smokeless tobacco.  Her smokeless tobacco use included snuff. She reports that she does not drink alcohol and does not use drugs.  Allergies  Allergen Reactions   Tape Itching and Rash    Family History  Problem Relation Age of Onset   Cancer Mother    Stomach cancer Mother 42   Heart attack Father    Cancer Brother    Lung cancer Brother 25   Breast cancer Sister 65    Prior to Admission medications   Medication Sig Start Date End Date Taking? Authorizing Provider  acetaminophen  (TYLENOL ) 650 MG CR tablet Take 1,300 mg by mouth every 8 (eight) hours as needed for pain.   Yes [provider]  amLODipine  (NORVASC ) 10 MG tablet Take 1 tablet (10 mg total) by mouth daily. 05/17/21  Yes Gonfa, Taye T, MD  atorvastatin  (LIPITOR) 40 MG tablet Take 40 mg by mouth daily.   Yes [provider]  calcitRIOL (ROCALTROL) 0.25 MCG capsule Take 0.25 mcg by mouth daily. 12/22/22  Yes [provider]  Camphor-Menthol-Methyl Sal (SALONPAS EX) Apply 1 patch topically as needed (pain).   Yes [provider]   cycloSPORINE (RESTASIS) 0.05 % ophthalmic emulsion Place 1 drop into both eyes daily.   Yes [provider]  ferrous sulfate  325 (65 FE) MG tablet Take 1 tablet (325 mg total) by mouth daily with breakfast. 05/31/21  Yes Amin, Ankit C, MD  furosemide  (LASIX ) 40 MG tablet Take 1 tablet (40 mg total) by mouth daily as needed for fluid (ankle swelling). Patient taking differently: Take 40 mg by mouth 2 (two) times daily. 05/23/21  Yes Gonfa, Taye T, MD  hydrALAZINE  (APRESOLINE ) 100 MG tablet Take 100 mg by mouth 2 (two) times daily.   Yes [provider]  metoprolol  succinate (TOPROL -XL) 25 MG 24 hr tablet Take 25 mg by mouth daily. 12/22/22  Yes [provider]  pantoprazole  (PROTONIX ) 40 MG tablet Take 40 mg by mouth daily.   Yes [provider]  senna-docusate (SENOKOT-S) 8.6-50 MG tablet Take 1 tablet by mouth at bedtime as needed for mild constipation or moderate constipation. 05/30/21  Yes Amin, Ankit C, MD  traZODone  (DESYREL ) 50 MG tablet Take 50 mg by mouth at bedtime. 01/19/23  Yes [provider]  mirtazapine  (REMERON ) 30 MG tablet Take 30 mg by mouth at bedtime. Patient not taking: Reported on  10/21/2024 04/07/21   [provider]  sertraline  (ZOLOFT ) 100 MG tablet Take 100 mg by mouth every morning. Patient not taking: Reported on 10/21/2024    [provider]    Physical Exam: Vitals:   10/21/24 0905 10/21/24 0920 10/21/24 0931 10/21/24 1000  BP: (!) 123/107 (!) 121/106 (!) 121/110 (!) 140/118  Pulse: 69 71 68 73  Resp: 20 18 19  (!) 22  Temp:      TempSrc:      SpO2: 100% 100% 100% 99%   Constitutional: Elderly female appears to be restless in some respiratory distress Eyes: PERRL, lids and conjunctivae normal ENMT: Mucous membranes are moist. Posterior pharynx clear of any exudate or lesions.Normal dentition.  Neck: normal, supple.  JVD present. Respiratory: Tachypneic currently on BiPAP Cardiovascular: Regular rate and  rhythm with positive systolic murmur present.  At least +1 pitting lower extremity edema Abdomen: no tenderness, no masses palpated. Bowel sounds positive.  Musculoskeletal: no clubbing / cyanosis. No joint deformity upper and lower extremities. Good ROM, no contractures. Normal muscle tone.  Skin: no rashes, lesions, ulcers. No induration Neurologic: CN 2-12 grossly intact.  Strength 5/5 in all 4.  Psychiatric: Lethargic oriented only to self.  Normal mood.  Data Reviewed:  EKG reveals sinus rhythm at 88 bpm with QTc 500.  Reviewed labs, imaging, pertinent records as documented  Assessment and Plan: Acute respiratory failure with hypoxia secondary to heart failure with reduced EF Acute on chronic.  Patient presents with complaints of shortness of breath and right side pain.  BNP elevated at 2214.4.  Chest x-ray showed diffuse interstitial edema with hazy airspace opacities concerning for pulmonary edema.  Patient was given Lasix  40 mg IV.  Patient had been placed on BiPAP due to respiratory distress.  - Admit to a telemetry bed - Heart failure order set utilized - Continuous pulse oximetry with oxygen maintain O2 saturation greater than 90% - Continue BiPAP - N.p.o until off of BiPAP - Strict I&O's and daily weights - Check echocardiogram (echocardiogram noted EF to be 45 to 50% with grade 1 diastolic dysfunction - Lasix  120 mg IV total x 1 dose, then 80 mg twice daily  Acute kidney injury superimposed on chronic kidney disease stage V Patient presents with creatinine elevated up to 10.15 with BUN 137.  Previously noted to be around 4.  Patient appears to be at end-stage renal disease at this time.  She was reported not to dialysis as noted by daughter. - Check urinalysis once able - Avoid possible nephrotoxic agents - Recheck kidney function in a.m. - Nephrology consulted for additional recommendations, follow-up for additional recommendations  Hypertensive urgency Acute.  On  admission blood pressures elevated up to 226/115.  Patient noted to be noncompliant with blood pressure regimen. - Resume home blood pressure regimen of metoprolol , hydralazine , and amlodipine  once able - Hydralazine  IV as needed for elevated blood pressure  Elevated troponin High-sensitivity troponins 198->408.  Thought secondary to demand in the setting of CHF exacerbation. -Thought secondary to demand. - Follow-up echocardiogram  Acute metabolic encephalopathy Patient appears to be altered and not able to give further information.  Thought likely secondary to uremia. - Delirium precautions  Metabolic acidosis with elevated anion gap Labs revealed CO2 of 13 with anion gap 17.  Thought secondary to uremia. - Continue to monitor  Thrombocytopenia Acute.  Platelet count noted to be 120.  Possibly secondary to above. - Continue to monitor  Controlled diabetes mellitus type 2, without long-term use  of insulin  Patient is not on any medications for treatment.  Last hemoglobin A1c noted to be 4.4 when checked on 01/26/2023.  Depression - Continue Zoloft    Insomnia - Continue trazodone   History of breast and uterine cancer  DVT prophylaxis: Heparin Advance Care Planning:   Code Status: Do not attempt resuscitation (DNR) PRE-ARREST INTERVENTIONS DESIRED.  Discussed with family at bedside.   Consults: Nephrology, cardiology Family Communication: Daughter updated at bedside Severity of Illness: The appropriate patient status for this patient is INPATIENT. Inpatient status is judged to be reasonable and necessary in order to provide the required intensity of service to ensure the patient's safety. The patient's presenting symptoms, physical exam findings, and initial radiographic and laboratory data in the context of their chronic comorbidities is felt to place them at high risk for further clinical deterioration. Furthermore, it is not anticipated that the patient will be medically stable  for discharge from the hospital within 2 midnights of admission.   * I certify that at the point of admission it is my clinical judgment that the patient will require inpatient hospital care spanning beyond 2 midnights from the point of admission due to high intensity of service, high risk for further deterioration and high frequency of surveillance required.*  Author: Maximino DELENA Sharps, MD 10/21/2024 10:27 AM  For on call review [email or web address].

## 2024-10-21 NOTE — Consult Note (Addendum)
 Reason for Consult: Renal failure Referring Physician:  Dr. 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  She absolutely does not want dialysis or seeing her daughter and sister being on dialysis.  She is followed closely by Dr. Katheryn Saba and has already been to see palliative care. - diurese aggressively; very [place] w medications, lives by herself. Her daughter is doing the best she can. . - Highly recommend having palliative see her in the hospital as well  Update: Appreciate Dr. Saba who has followed the patient for years coming down to speak with the family, son and daughter, given that Dr. Saba has followed the patient for years and the patient has repeatedly stated that she never wants to be on dialysis.  Also appreciate palliative team speaking to the family as well but unfortunately they are insisting on dialysis; I spoke to them very clearly and they understand that they are going against her wishes.  Patient CCM agreeing to place a temporary catheter and we will initiate dialysis.  When the patient is no longer uremic and able to make her decisions we can address whether to continue at that time.  -Monitor Daily I/Os, Daily weight  -Maintain MAP>65 for optimal renal perfusion.  - Avoid nephrotoxic agents such as IV contrast, NSAIDs, and phosphate containing bowel preps (FLEETS)  **Hypertensive emergency secondary to noncompliance seen by cardiology - starting BiDil, restart home meds; avoid ACEi/ARB with advanced kidney disease    **Anemia:  receives ESA q2wks outpt and IV iron  PRN.  Hb 9x2 - will check iron  panels   **CHF with BNP 2214 - diurese aggressively as tolerated  **HTN: high in setting of missed meds -- restarted here as well as addition of BiDil   **DM: per primary   HPI: Erica Whitney is an 83 y.o. female with a history of hypertension, diabetes with peripheral neuropathy, hyperlipidemia, anemia  of chronic kidney disease, history of breast cancer, uterine cancer, peptic ulcer disease, DJD, CKD 5 followed by Dr. Adra presenting with shortness of breath which has progressively gotten worse in the past week and noted to have a blood pressure of 240/120.  Chest x-ray shows pulmonary vascular congestion, sublingual nitro as well as nitroglycerin drip with drop in blood pressure to 152/126 as well as improvement in oxygenation after starting BiPAP.  Patient has had history of orthopnea but denies any significant leg swelling.  Patient was just seen at [facility] on 10/06/2024 at which time Lasix  was increased to 40 mg twice daily for 4 days and then back to 40 mg daily.  Patient does not want dialysis after seeing her daughter and sister being on dialysis.  Patient already followed by palliative care.  ROS Pertinent items are noted in HPI.  Chemistry and CBC: Creatinine  Date/Time Value Ref Range Status  02/15/2023 09:46 AM 3.86 (H) 0.44 - 1.00 mg/dL Final  94/81/7979 91:83 AM 1.34 (H) 0.44 - 1.00 mg/dL Final  88/70/7981 92:40 AM 1.1 0.6 - 1.1 mg/dL Final  94/70/7981 98:67 PM 1.1 0.6 - 1.1 mg/dL Final  88/72/7982 87:40 PM 1.0 0.6 - 1.1 mg/dL Final  92/92/7982 98:49 PM 1.0 0.6 - 1.1 mg/dL Final  97/79/7982 97:92 PM 0.9 0.6 - 1.1 mg/dL Final  88/78/7983 87:48 PM 1.1 0.6 - 1.1 mg/dL Final  91/80/7983 87:50 PM 1.0 0.6 - 1.1 mg/dL Final  94/72/7983 98:83 PM 1.0 0.6 - 1.1 mg/dL Final  97/76/7983 97:72 PM 0.8 0.6 -  1.1 mg/dL Final  98/78/7983 97:46 PM 0.8 0.6 - 1.1 mg/dL Final  88/74/7984 91:61 AM 1.0 0.6 - 1.1 mg/dL Final   Creat  Date/Time Value Ref Range Status  02/27/2023 09:18 AM 4.38 (H) 0.60 - 0.95 mg/dL Final  94/69/7976 90:59 AM 3.41 (H) 0.60 - 1.00 mg/dL Final  97/89/7977 90:49 AM 2.54 (H) 0.60 - 0.93 mg/dL Final    Comment:    For patients >28 years of age, the reference limit for Creatinine is approximately 13% higher for people identified as African-American. .    Creatinine,  Ser  Date/Time Value Ref Range Status  10/21/2024 08:17 AM 10.15 (H) 0.44 - 1.00 mg/dL Final  97/87/7975 96:91 AM 4.22 (H) 0.44 - 1.00 mg/dL Final  97/88/7975 97:51 AM 4.28 (H) 0.44 - 1.00 mg/dL Final  97/89/7975 96:73 AM 4.44 (H) 0.44 - 1.00 mg/dL Final  97/90/7975 91:46 AM 4.60 (H) 0.44 - 1.00 mg/dL Final  97/90/7975 97:49 AM 4.91 (H) 0.44 - 1.00 mg/dL Final  97/83/7976 89:63 AM 3.05 (HH) 0.44 - 1.00 mg/dL Final    Comment:    REPEATED TO VERIFY CRITICAL RESULT CALLED TO, READ BACK BY AND VERIFIED WITH: [facility] MELVIN RN @ 1110 BY J SCOTTON ON 02/01/22    05/30/2021 04:16 AM 2.26 (H) 0.44 - 1.00 mg/dL Final  93/87/7977 96:82 AM 2.22 (H) 0.44 - 1.00 mg/dL Final  93/88/7977 95:92 AM 2.35 (H) 0.44 - 1.00 mg/dL Final  93/89/7977 87:42 AM 2.39 (H) 0.44 - 1.00 mg/dL Final  93/90/7977 98:98 AM 2.77 (H) 0.44 - 1.00 mg/dL Final  93/91/7977 98:43 PM 2.89 (H) 0.44 - 1.00 mg/dL Final  94/69/7977 98:73 AM 2.68 (H) 0.44 - 1.00 mg/dL Final  94/70/7977 93:49 AM 2.56 (H) 0.44 - 1.00 mg/dL Final  94/72/7977 90:96 AM 2.22 (H) 0.44 - 1.00 mg/dL Final  97/82/7977 89:70 AM 3.08 (HH) 0.44 - 1.00 mg/dL Final    Comment:    CRITICAL RESULT CALLED TO, READ BACK BY AND VERIFIED WITH: [facility] MOON AT 1129 BY HFLYNT 02/03/21   11/05/2019 10:28 AM 1.51 (H) 0.44 - 1.00 mg/dL Final  88/77/7980 89:90 AM 1.17 (H) 0.44 - 1.00 mg/dL Final  94/70/7980 87:52 PM 1.24 (H) 0.60 - 1.10 mg/dL Final  87/68/7984 93:55 AM 0.90 0.50 - 1.10 mg/dL Final  97/87/7986 89:69 AM 0.73 0.50 - 1.10 mg/dL Final  98/91/7986 87:85 PM 0.74 0.50 - 1.10 mg/dL Final  87/93/7988 90:45 PM 0.88 0.40 - 1.20 mg/dL Final  97/78/7988 90:75 PM 0.79 0.40 - 1.20 mg/dL Final  92/84/7989 90:54 PM 0.86 0.40 - 1.20 mg/dL Final  89/79/7990 91:51 PM 0.86 0.40 - 1.20 mg/dL Final  97/90/7990 91:42 PM 0.74 0.40 - 1.20 mg/dL Final   Recent Labs  Lab 10/21/24 0817 10/21/24 0823 10/21/24 1501  NA 145 146* 146*  K 4.1 4.1 4.1  CL 115*  --   --   CO2 13*  --    --   GLUCOSE 169*  --   --   BUN 137*  --   --   CREATININE 10.15*  --   --   CALCIUM  8.3*  --   --    Recent Labs  Lab 10/21/24 0817 10/21/24 0823 10/21/24 1501  WBC 6.6  --   --   NEUTROABS 5.4  --   --   HGB 9.4* 9.5* 9.2*  HCT 30.0* 28.0* 27.0*  MCV 98.7  --   --   PLT 120*  --   --    Liver  Function Tests: Recent Labs  Lab 10/21/24 0817  AST 40  ALT 24  ALKPHOS 45  BILITOT 0.9  PROT 6.6  ALBUMIN 3.3*   No results for input(s): LIPASE, AMYLASE in the last 168 hours. No results for input(s): AMMONIA in the last 168 hours. Cardiac Enzymes: No results for input(s): CKTOTAL, CKMB, CKMBINDEX, TROPONINI in the last 168 hours. Iron  Studies: No results for input(s): IRON , TIBC, TRANSFERRIN, FERRITIN in the last 72 hours. PT/INR: @LABRCNTIP (inr:5)  Xrays/Other Studies: ) Results for orders placed or performed during the hospital encounter of 10/21/24 (from the past 48 hours)  Resp panel by RT-PCR (RSV, Flu A&B, Covid) Anterior Nasal Swab     Status: None   Collection Time: 10/21/24  8:06 AM   Specimen: Anterior Nasal Swab  Result Value Ref Range   SARS Coronavirus 2 by RT PCR NEGATIVE NEGATIVE   Influenza A by PCR NEGATIVE NEGATIVE   Influenza B by PCR NEGATIVE NEGATIVE    Comment: (NOTE) The Xpert Xpress SARS-CoV-2/FLU/RSV plus assay is intended as an aid in the diagnosis of influenza from Nasopharyngeal swab specimens and should not be used as a sole basis for treatment. Nasal washings and aspirates are unacceptable for Xpert Xpress SARS-CoV-2/FLU/RSV testing.  Fact Sheet for Patients: bloggercourse.com  Fact Sheet for Healthcare Providers: seriousbroker.it  This test is not yet approved or cleared by the [place]  FDA and has been authorized for detection and/or diagnosis of SARS-CoV-2 by FDA under an Emergency Use Authorization (EUA). This EUA will remain in effect (meaning this  test can be used) for the duration of the COVID-19 declaration under Section 564(b)(1) of the Act, 21 U.S.C. section 360bbb-3(b)(1), unless the authorization is terminated or revoked.     Resp Syncytial Virus by PCR NEGATIVE NEGATIVE    Comment: (NOTE) Fact Sheet for Patients: bloggercourse.com  Fact Sheet for Healthcare Providers: seriousbroker.it  This test is not yet approved or cleared by the [place]  FDA and has been authorized for detection and/or diagnosis of SARS-CoV-2 by FDA under an Emergency Use Authorization (EUA). This EUA will remain in effect (meaning this test can be used) for the duration of the COVID-19 declaration under Section 564(b)(1) of the Act, 21 U.S.C. section 360bbb-3(b)(1), unless the authorization is terminated or revoked.  Performed at [facility], [address], [place], [place] [address]   Troponin I (High Sensitivity)     Status: Abnormal   Collection Time: 10/21/24  8:17 AM  Result Value Ref Range   Troponin I (High Sensitivity) 198 (HH) <18 ng/L    Comment: CRITICAL RESULT CALLED TO, READ BACK BY AND VERIFIED WITH THORNTON,S RN [phone] 10/21/24 AMIREHSANIF (NOTE) Elevated high sensitivity troponin I (hsTnI) values and significant  changes across serial measurements may suggest ACS but many other  chronic and acute conditions are known to elevate hsTnI results.  Refer to the Links section for chest pain algorithms and additional  guidance. Performed at [facility], [address], [place], [place] [address]   CBC with Differential     Status: Abnormal   Collection Time: 10/21/24  8:17 AM  Result Value Ref Range   WBC 6.6 4.0 - 10.5 K/uL   RBC 3.04 (L) 3.87 - 5.11 MIL/uL   Hemoglobin 9.4 (L) 12.0 - 15.0 g/dL   HCT 69.9 (L) 63.9 - 53.9 %   MCV 98.7 80.0 - 100.0 fL   MCH 30.9 26.0 - 34.0 pg   MCHC 31.3 30.0 - 36.0 g/dL  RDW 18.5 (H) 11.5 - 15.5 %   Platelets 120 (L) 150  - 400 K/uL   nRBC 0.0 0.0 - 0.2 %   Neutrophils Relative % 82 %   Neutro Abs 5.4 1.7 - 7.7 K/uL   Lymphocytes Relative 6 %   Lymphs Abs 0.4 (L) 0.7 - 4.0 K/uL   Monocytes Relative 11 %   Monocytes Absolute 0.7 0.1 - 1.0 K/uL   Eosinophils Relative 0 %   Eosinophils Absolute 0.0 0.0 - 0.5 K/uL   Basophils Relative 0 %   Basophils Absolute 0.0 0.0 - 0.1 K/uL   Immature Granulocytes 1 %   Abs Immature Granulocytes 0.05 0.00 - 0.07 K/uL    Comment: Performed at [facility], [address], [place], [place] [address]  Comprehensive metabolic panel     Status: Abnormal   Collection Time: 10/21/24  8:17 AM  Result Value Ref Range   Sodium 145 135 - 145 mmol/L   Potassium 4.1 3.5 - 5.1 mmol/L   Chloride 115 (H) 98 - 111 mmol/L   CO2 13 (L) 22 - 32 mmol/L   Glucose, Bld 169 (H) 70 - 99 mg/dL    Comment: Glucose reference range applies only to samples taken after fasting for at least 8 hours.   BUN 137 (H) 8 - 23 mg/dL   Creatinine, Ser 89.84 (H) 0.44 - 1.00 mg/dL   Calcium  8.3 (L) 8.9 - 10.3 mg/dL   Total Protein 6.6 6.5 - 8.1 g/dL   Albumin 3.3 (L) 3.5 - 5.0 g/dL   AST 40 15 - 41 U/L   ALT 24 0 - 44 U/L   Alkaline Phosphatase 45 38 - 126 U/L   Total Bilirubin 0.9 0.0 - 1.2 mg/dL   GFR, Estimated 4 (L) >60 mL/min    Comment: (NOTE) Calculated using the CKD-EPI Creatinine Equation (2021)    Anion gap 17 (H) 5 - 15    Comment: Performed at [facility], [address], [place], [place] [address]  Brain natriuretic peptide     Status: Abnormal   Collection Time: 10/21/24  8:17 AM  Result Value Ref Range   B Natriuretic Peptide 2,214.4 (H) 0.0 - 100.0 pg/mL    Comment: Performed at [facility], [address], [place], [place] [address]  I-Stat venous blood gas, [facility], [facility], [facility])     Status: Abnormal   Collection Time: 10/21/24  8:23 AM  Result Value Ref Range   pH, Ven 7.278 7.25 - 7.43   pCO2, Ven 26.3 (L) 44 - 60 mmHg   pO2, Ven 115 (H) 32 - 45 mmHg    Bicarbonate 12.3 (L) 20.0 - 28.0 mmol/L   TCO2 13 (L) 22 - 32 mmol/L   O2 Saturation 98 %   Acid-base deficit 13.0 (H) 0.0 - 2.0 mmol/L   Sodium 146 (H) 135 - 145 mmol/L   Potassium 4.1 3.5 - 5.1 mmol/L   Calcium , Ion 1.09 (L) 1.15 - 1.40 mmol/L   HCT 28.0 (L) 36.0 - 46.0 %   Hemoglobin 9.5 (L) 12.0 - 15.0 g/dL   Sample type VENOUS   Troponin I (High Sensitivity)     Status: Abnormal   Collection Time: 10/21/24 11:38 AM  Result Value Ref Range   Troponin I (High Sensitivity) 408 (HH) <18 ng/L    Comment: CRITICAL VALUE NOTED. VALUE IS CONSISTENT WITH PREVIOUSLY REPORTED/CALLED VALUE (NOTE) Elevated high sensitivity troponin I (hsTnI) values and significant  changes  across serial measurements may suggest ACS but many other  chronic and acute conditions are known to elevate hsTnI results.  Refer to the Links section for chest pain algorithms and additional  guidance. Performed at [facility], [address], [place], [place] [address]   CBG monitoring, ED     Status: Abnormal   Collection Time: 10/21/24  2:20 PM  Result Value Ref Range   Glucose-Capillary 126 (H) 70 - 99 mg/dL    Comment: Glucose reference range applies only to samples taken after fasting for at least 8 hours.   Comment 1 Document in Chart   I-Stat arterial blood gas, ED     Status: Abnormal   Collection Time: 10/21/24  3:01 PM  Result Value Ref Range   pH, Arterial 7.231 (L) 7.35 - 7.45   pCO2 arterial 31.1 (L) 32 - 48 mmHg   pO2, Arterial 204 (H) 83 - 108 mmHg   Bicarbonate 13.0 (L) 20.0 - 28.0 mmol/L   TCO2 14 (L) 22 - 32 mmol/L   O2 Saturation 100 %   Acid-base deficit 13.0 (H) 0.0 - 2.0 mmol/L   Sodium 146 (H) 135 - 145 mmol/L   Potassium 4.1 3.5 - 5.1 mmol/L   Calcium , Ion 1.18 1.15 - 1.40 mmol/L   HCT 27.0 (L) 36.0 - 46.0 %   Hemoglobin 9.2 (L) 12.0 - 15.0 g/dL   Patient temperature 01.0 F    Collection site RADIAL, ALLEN'S TEST ACCEPTABLE    Drawn by RT    Sample type ARTERIAL     ECHOCARDIOGRAM COMPLETE Result Date: 10/21/2024    ECHOCARDIOGRAM REPORT   Patient Name:   Erica Whitney Choe Date of Exam: 10/21/2024 Medical Rec #:  [id number]          Height:       63.0 in Accession #:    [id number]         Weight:       114.0 lb Date of Birth:  [birth year]         BSA:          1.523 m Patient Age:    82 years           BP:           123/96 mmHg Patient Gender: F                  HR:           80 bpm. Exam Location:  Inpatient Procedure: 2D Echo (Both Spectral and Color Flow Doppler were utilized during            procedure). Indications:     Dyspnea  History:         Patient has no prior history of Echocardiogram examinations.                  Signs/Symptoms:Dyspnea.  Sonographer:     Norleen Amour Referring Phys:  [id number] RONDELL A SMITH Diagnosing Phys: Salena Negri MD IMPRESSIONS  1. Left ventricular ejection fraction, by estimation, is 45 to 50%. The left ventricle has mildly decreased function. The left ventricle demonstrates regional wall motion abnormalities (see scoring diagram/findings for description). The left ventricular  internal cavity size was mildly dilated. Left ventricular diastolic parameters are consistent with Grade I diastolic dysfunction (impaired relaxation). There is moderate hypokinesis of the left ventricular, entire septal wall. The average left ventricular global longitudinal strain is -21.7 %. The global longitudinal strain is abnormal.  2. Right ventricular systolic function is normal. The right ventricular size is normal. There is moderately elevated pulmonary artery systolic pressure.  3. Left atrial size was severely dilated.  4. Right atrial size was mildly dilated.  5. There is no evidence of cardiac tamponade.  6. The mitral valve is degenerative. Severe mitral valve regurgitation.  7. Tricuspid valve regurgitation is moderate.  8. The aortic valve is tricuspid. There is mild calcification of the aortic valve. Aortic valve regurgitation is trivial.  Aortic valve sclerosis is present, with no evidence of aortic valve stenosis.  9. The inferior vena cava is dilated in size with <50% respiratory variability, suggesting right atrial pressure of 15 mmHg. 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 FINDINGS  Left Ventricle: Left ventricular ejection fraction, by estimation, is 45 to 50%. The left ventricle has mildly decreased function. The left ventricle demonstrates regional wall motion abnormalities. Moderate hypokinesis of the left ventricular, entire septal wall. The average left ventricular global longitudinal strain is -21.7 %. Strain was performed and the global longitudinal strain is abnormal. The left ventricular internal cavity size was mildly dilated. There is borderline concentric left ventricular hypertrophy. Left ventricular diastolic parameters are consistent with Grade I diastolic dysfunction (impaired relaxation).  LV Wall Scoring: The entire septum is hypokinetic. The entire anterior wall, entire lateral wall, entire inferior wall, and apex are normal. Right Ventricle: The right ventricular size is normal. No increase in right ventricular wall thickness. Right ventricular systolic function is normal. There is moderately elevated pulmonary artery systolic pressure. The tricuspid regurgitant velocity is 3.49 m/s, and with an assumed right atrial pressure of 8 mmHg, the estimated right ventricular systolic pressure is 56.7 mmHg. Left Atrium: Left atrial size was severely dilated. Right Atrium: Right atrial size was mildly dilated. Pericardium: Trivial pericardial effusion is present. The pericardial effusion is posterior to the left ventricle. There is no evidence of cardiac tamponade. Mitral Valve: The mitral valve is degenerative in appearance. Severe mitral valve regurgitation. Tricuspid Valve: The tricuspid valve is grossly normal. Tricuspid valve regurgitation is moderate.  Aortic Valve: The aortic valve is tricuspid. There is mild calcification of the aortic valve. There is mild aortic valve annular calcification. Aortic valve regurgitation is trivial. Aortic valve sclerosis is present, with no evidence of aortic valve stenosis. Pulmonic Valve: The pulmonic valve was normal in structure. Pulmonic valve regurgitation is trivial. Aorta: The aortic root is normal in size and structure. Venous: The inferior vena cava is dilated in size with less than 50% respiratory variability, suggesting right atrial pressure of 15 mmHg. IAS/Shunts: The atrial septum is grossly normal. Additional Comments: 3D was performed not requiring image post processing on an independent workstation and was indeterminate.  LEFT VENTRICLE PLAX 2D LVIDd:         5.70 cm      Diastology LVIDs:         4.20 cm      LV e' medial:    4.99 cm/s LV PW:         1.00 cm      LV E/e' medial:  27.9 LV IVS:        0.90 cm      LV e' lateral:   9.11 cm/s LVOT diam:     2.00 cm      LV E/e' lateral: 15.3 LV SV:         63 LV SV Index:   42  2D Longitudinal Strain LVOT Area:     3.14 cm     2D Strain GLS (A4C):   -19.9 % LV IVRT:       71 msec      2D Strain GLS (A3C):   -21.9 %                             2D Strain GLS (A2C):   -23.3 %                             2D Strain GLS Avg:     -21.7 % LV Volumes (MOD) LV vol d, MOD A2C: 114.0 ml LV vol d, MOD A4C: 128.0 ml LV vol s, MOD A2C: 55.9 ml LV vol s, MOD A4C: 62.2 ml LV SV MOD A2C:     58.1 ml LV SV MOD A4C:     128.0 ml LV SV MOD BP:      64.0 ml RIGHT VENTRICLE RV Basal diam:  4.30 cm     PULMONARY VEINS RV S prime:     13.40 cm/s  Diastolic Velocity: 71.00 cm/s TAPSE (M-mode): 2.3 cm      S/D Velocity:       0.60                             Systolic Velocity:  45.50 cm/s LEFT ATRIUM              Index        RIGHT ATRIUM           Index LA diam:        4.30 cm  2.82 cm/m   RA Area:     18.60 cm LA Vol (A2C):   91.3 ml  59.96 ml/m  RA Volume:   54.00 ml  35.46  ml/m LA Vol (A4C):   115.0 ml 75.52 ml/m LA Biplane Vol: 102.0 ml 66.99 ml/m  AORTIC VALVE             PULMONIC VALVE LVOT Vmax:   105.00 cm/s PV Vmax:       1.13 m/s LVOT Vmean:  67.200 cm/s PV Peak grad:  5.1 mmHg LVOT VTI:    0.202 m  AORTA Ao Root diam: 2.70 cm Ao Asc diam:  3.00 cm MITRAL VALVE                  TRICUSPID VALVE MV Area (PHT): 5.13 cm       TR Peak grad:   48.7 mmHg MV Decel Time: 148 msec       TR Vmax:        349.00 cm/s MR Peak grad:    189.9 mmHg MR Mean grad:    127.0 mmHg   SHUNTS MR Vmax:         689.00 cm/s  Systemic VTI:  0.20 m MR Vmean:        535.0 cm/s   Systemic Diam: 2.00 cm MR PISA:         1.57 cm MR PISA Eff ROA: 7 mm MR PISA Radius:  0.50 cm MV E velocity: 139.00 cm/s MV A velocity: 115.00 cm/s MV E/A ratio:  1.21 Salena Negri MD Electronically signed by Salena Negri MD Signature Date/Time: 10/21/2024/2:49:25 PM    Final    DG Chest Portable 1  View Result Date: 10/21/2024 EXAM: 1 VIEW(S) XRAY OF THE CHEST 10/21/2024 08:50:00 AM COMPARISON: 01/26/2023 CLINICAL HISTORY: SOB FINDINGS: LUNGS AND PLEURA: Diffuse interstitial and hazy airspace opacities throughout lungs. ncreased peripheral septal markings identified within the lower lung zones are compatible with interstitial edema. No pleural effusion. No pneumothorax. HEART AND MEDIASTINUM: Mild cardiomegaly. Atherosclerotic calcifications of aortic arch. BONES AND SOFT TISSUES: Multilevel degenerative disc disease of spine. Scoliotic curvature. IMPRESSION: 1. Diffuse interstitial and hazy airspace opacities throughout the lungs, compatible with interstitial edema. Superimposed infection not excluded. 2. Mild cardiomegaly. Electronically signed by: Waddell Calk MD 10/21/2024 08:55 AM EST RP Workstation: [id number]    PMH:   Past Medical History:  Diagnosis Date   Anemia in chronic kidney disease (CKD)    Arthritis    Breast cancer (HCC)    Breast cancer of upper-outer quadrant of right female breast (HCC)  11/06/2014   Chronic kidney disease (CKD), stage IV (severe) (HCC)    Depression    Diabetes mellitus    Hypercholesteremia    Hypertension    Insomnia    Peptic ulcer    Uterine cancer (HCC)    dx in her 74s   Wears glasses     PSH:   Past Surgical History:  Procedure Laterality Date   ABDOMINAL HYSTERECTOMY  1995   BACK SURGERY  2000   lumb lam   BIOPSY  05/14/2021   Procedure: BIOPSY;  Surgeon: Legrand Victory LITTIE DOUGLAS, MD;  Location: [facility] ENDOSCOPY;  Service: Gastroenterology;;   BREAST LUMPECTOMY Right 2015   COLONOSCOPY     ESOPHAGOGASTRODUODENOSCOPY N/A 05/14/2021   Procedure: ESOPHAGOGASTRODUODENOSCOPY (EGD);  Surgeon: Legrand Victory LITTIE DOUGLAS, MD;  Location: [facility] ENDOSCOPY;  Service: Gastroenterology;  Laterality: N/A;   ESOPHAGOGASTRODUODENOSCOPY (EGD) WITH PROPOFOL  N/A 05/26/2021   Procedure: ESOPHAGOGASTRODUODENOSCOPY (EGD) WITH PROPOFOL ;  Surgeon: Rollin Dover, MD;  Location: [facility] ENDOSCOPY;  Service: Endoscopy;  Laterality: N/A;   HEMOSTASIS CLIP PLACEMENT  05/26/2021   Procedure: HEMOSTASIS CLIP PLACEMENT;  Surgeon: Rollin Dover, MD;  Location: [facility] ENDOSCOPY;  Service: Endoscopy;;   HEMOSTASIS CONTROL  05/14/2021   Procedure: HEMOSTASIS CONTROL;  Surgeon: Legrand Victory LITTIE DOUGLAS, MD;  Location: [facility] ENDOSCOPY;  Service: Gastroenterology;;   HOT HEMOSTASIS N/A 05/14/2021   Procedure: HOT HEMOSTASIS (ARGON PLASMA COAGULATION/BICAP);  Surgeon: Legrand Victory LITTIE DOUGLAS, MD;  Location: [facility] ENDOSCOPY;  Service: Gastroenterology;  Laterality: N/A;   HOT HEMOSTASIS N/A 05/26/2021   Procedure: HOT HEMOSTASIS (ARGON PLASMA COAGULATION/BICAP);  Surgeon: Rollin Dover, MD;  Location: [facility] ENDOSCOPY;  Service: Endoscopy;  Laterality: N/A;   ORIF METACARPAL FRACTURE  2012   left   RADIOACTIVE SEED GUIDED PARTIAL MASTECTOMY WITH AXILLARY SENTINEL LYMPH NODE BIOPSY Right 12/17/2014   Procedure: RIGHT BREAST RADIOACTIVE SEED LOCALIZED LUMPECTOMY AND SENTINEL NODE MAPPING;  Surgeon: Deward Null III, MD;  Location: [facility];  Service: General;  Laterality: Right;   SCLEROTHERAPY  05/26/2021   Procedure: MATIAS;  Surgeon: Rollin Dover, MD;  Location: [facility] ENDOSCOPY;  Service: Endoscopy;;    Allergies:  Allergies  Allergen Reactions   Tape Itching and Rash    Medications:   Prior to Admission medications   Medication Sig Start Date End Date Taking? Authorizing Provider  acetaminophen  (TYLENOL ) 650 MG CR tablet Take 1,300 mg by mouth every 8 (eight) hours as needed for pain.   Yes [provider]  amLODipine  (NORVASC ) 10 MG tablet Take 1 tablet (10 mg total) by mouth daily. 05/17/21  Yes Gonfa, Taye T, MD  atorvastatin  (LIPITOR) 40 MG tablet Take 40 mg by mouth daily.   Yes [provider]  calcitRIOL (ROCALTROL) 0.25 MCG capsule Take 0.25 mcg by mouth daily. 12/22/22  Yes [provider]  Camphor-Menthol-Methyl Sal (SALONPAS EX) Apply 1 patch topically as needed (pain).   Yes [provider]  cycloSPORINE (RESTASIS) 0.05 % ophthalmic emulsion Place 1 drop into both eyes daily.   Yes [provider]  ferrous sulfate  325 (65 FE) MG tablet Take 1 tablet (325 mg total) by mouth daily with breakfast. 05/31/21  Yes Amin, Ankit C, MD  furosemide  (LASIX ) 40 MG tablet Take 1 tablet (40 mg total) by mouth daily as needed for fluid (ankle swelling). Patient taking differently: Take 40 mg by mouth 2 (two) times daily. 05/23/21  Yes Gonfa, Taye T, MD  hydrALAZINE  (APRESOLINE ) 100 MG tablet Take 100 mg by mouth 2 (two) times daily.   Yes [provider]  metoprolol  succinate (TOPROL -XL) 25 MG 24 hr tablet Take 25 mg by mouth daily. 12/22/22  Yes [provider]  pantoprazole  (PROTONIX ) 40 MG tablet Take 40 mg by mouth daily.   Yes [provider]  senna-docusate (SENOKOT-S) 8.6-50 MG tablet Take 1 tablet by mouth at bedtime as needed for mild constipation or moderate constipation. 05/30/21  Yes Amin, Ankit C, MD  traZODone  (DESYREL ) 50 MG  tablet Take 50 mg by mouth at bedtime. 01/19/23  Yes [provider]  mirtazapine  (REMERON ) 30 MG tablet Take 30 mg by mouth at bedtime. Patient not taking: Reported on 10/21/2024 04/07/21   [provider]  sertraline  (ZOLOFT ) 100 MG tablet Take 100 mg by mouth every morning. Patient not taking: Reported on 10/21/2024    [provider]    Discontinued Meds:   Medications Discontinued During This Encounter  Medication Reason   pantoprazole  (PROTONIX ) 40 MG tablet Duplicate   gabapentin  (NEURONTIN ) 100 MG capsule Patient Preference   saccharomyces boulardii (FLORASTOR) 250 MG capsule Patient Preference   nitroGLYCERIN 50 mg in dextrose  5 % 250 mL (0.2 mg/mL) infusion    sertraline  (ZOLOFT ) tablet 100 mg     Social History:  reports that she quit smoking about 12 years ago. Her smoking use included cigarettes. She has quit using smokeless tobacco.  Her smokeless tobacco use included snuff. She reports that she does not drink alcohol and does not use drugs.  Family History:   Family History  Problem Relation Age of Onset   Cancer Mother    Stomach cancer Mother 89   Heart attack Father    Cancer Brother    Lung cancer Brother 90   Breast cancer Sister 85    Blood pressure (!) 131/115, pulse (!) 111, temperature 98.9 F (37.2 C), temperature source Axillary, resp. rate (!) 23, SpO2 99%. Gen: elderly woman, awake and alert Eyes: anicteric  ENT: MMM,  poor dentition Neck: no swelling or JVD CV: RRR no rub Abd: soft Lungs: rales GU: no foley Extr: tr-1+ edema Neuro: awake and alert, moving all 4 ext        MELIA LYNWOOD ORN, MD 10/21/2024, 3:07 PM

## 2024-10-21 NOTE — Progress Notes (Signed)
 On Pt arrival Pt using accessory muscle usage with audible course breath sounds. Pt placed on BiPAP/PS 8/8 50% and is tolerating well at this time. Pt is VSS with HTN at this time.

## 2024-10-21 NOTE — Consult Note (Signed)
 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 ED vitals were blood pressure of 226/115, afebrile, tachypnea.  Chest x-ray showed diffuse interstitial edema with cardiomegaly. Labs significant for troponin 198, BNP 2214, BUN/creatinine 137/10.15, anion gap 17 She was seen by her primary nephrologist according to home patient had refused dialysis and multiple outpatient office visits.  She appeared confused and was not able to participate in decision making in the ED.  Son and daughter requested dialysis.  After discussion with nephrology, decision was made to initiate one-time dialysis, improve her sensorium and then reassess goals of care.  Perative care was involved. PCCM was consulted and right femoral HD catheter was placed.  She was on BiPAP but subsequently became agitated, ripped off BiPAP.  PCCM was called due to bleeding from femoral line and due to agitation  Pertinent  Medical History  CKD 5 Hypertension Diabetes with peripheral neuropathy Uterine cancer Breast cancer  Significant Hospital Events: Including procedures, antibiotic start and stop dates in addition to other pertinent events     Interim History / Subjective:  On my arrival, BiPAP mask back on, patient lying on her left side, mild bleeding around femoral HD catheter  Objective    Blood pressure (!) 237/106, pulse 88, temperature 98.2 F (36.8 C), temperature source Axillary, resp. rate (!) 24, SpO2 98%.    Vent Mode: PSV;BIPAP FiO2 (%):  [50 %] 50 % PEEP:  [5 cmH20-8 cmH20] 5 cmH20 Pressure Support:  [8 cmH20] 8 cmH20   Intake/Output Summary (Last 24 hours) at 10/21/2024 1951 Last data filed at 10/21/2024 1736 Gross per 24 hour  Intake 0.06 ml   Output 0 ml  Net 0.06 ml   There were no vitals filed for this visit.  Examination: General: Elderly woman, on BiPAP, no distress HENT: Mild pallor, pupils 2 mm reactive to light, no JVD Lungs: Bilateral scattered rhonchi, no accessory muscle use, no crackles Cardiovascular: S1-S2 regular, no murmur, no S3 Abdomen: Soft, nontender Extremities: No edema, right femoral HD catheter dressing appears soaked Neuro: Awake, follows one-step commands, cannot engage in discussion, confused   ABG 7.2 3/31/204 Hemoglobin 9.2. 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  Son and daughter understand that they are going against patient's known wishes.  Plan is to engage patient in decision making when she is no longer uremic -We will admit her to ICU to provide her 1 dialysis session until she can get off BiPAP since it will be difficult to manage an agitated patient on BiPAP on the floor - DNR/DNI has been issued , BiPAP as needed okay until breathing improves - Blood pressure drop with sublingual nitro and IV nitro can be used to manage blood pressure until dialysis provided , restart BiDil when able to  take oral - Empiric ceftriaxone - May need bicarbonate supplementation if HD is delayed  Acute uremic encephalopathy -intermittent agitation Use Haldol as needed until dialysis If needed Precedex can be used with goal RASS 0    Bleeding right femoral HD catheter -pressure held, dressing changed, no active bleeding   Son  and daughter updated at bedside   Labs   CBC: Recent Labs  Lab 10/21/24 0817 10/21/24 0823 10/21/24 1501  WBC 6.6  --   --   NEUTROABS 5.4  --   --   HGB 9.4* 9.5* 9.2*  HCT 30.0* 28.0* 27.0*  MCV 98.7  --   --   PLT 120*  --   --     Basic Metabolic Panel: Recent Labs  Lab 10/21/24 0817 10/21/24 0823 10/21/24 1501  NA 145 146* 146*  K 4.1 4.1 4.1  CL 115*  --   --   CO2 13*  --   --   GLUCOSE 169*  --   --   BUN 137*  --   --   CREATININE 10.15*  --   --   CALCIUM  8.3*  --   --    GFR: CrCl cannot be calculated (Unknown ideal weight.). Recent Labs  Lab 10/21/24 0817  WBC 6.6    Liver Function Tests: Recent Labs  Lab 10/21/24 0817  AST 40  ALT 24  ALKPHOS 45  BILITOT 0.9  PROT 6.6  ALBUMIN 3.3*   No results for input(s): LIPASE, AMYLASE in the last 168 hours. No results for input(s): AMMONIA in the last 168 hours.  ABG    Component Value Date/Time   PHART 7.231 (L) 10/21/2024 1501   PCO2ART 31.1 (L) 10/21/2024 1501   PO2ART 204 (H) 10/21/2024 1501   HCO3 13.0 (L) 10/21/2024 1501   TCO2 14 (L) 10/21/2024 1501   ACIDBASEDEF 13.0 (H) 10/21/2024 1501   O2SAT 100 10/21/2024 1501     Coagulation Profile: No results for input(s): INR, PROTIME in the last 168 hours.  Cardiac Enzymes: No results for input(s): CKTOTAL, CKMB, CKMBINDEX, TROPONINI in the last 168 hours.  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   Surgical History:   Past Surgical History:  Procedure Laterality Date   ABDOMINAL HYSTERECTOMY  1995   BACK SURGERY  2000   lumb lam   BIOPSY  05/14/2021   Procedure: BIOPSY;  Surgeon: Legrand Victory LITTIE DOUGLAS, MD;  Location: [facility] ENDOSCOPY;  Service: Gastroenterology;;   BREAST LUMPECTOMY Right 2015   COLONOSCOPY     ESOPHAGOGASTRODUODENOSCOPY N/A 05/14/2021   Procedure: ESOPHAGOGASTRODUODENOSCOPY (EGD);  Surgeon: Legrand Victory LITTIE DOUGLAS, MD;  Location: [facility] ENDOSCOPY;  Service: Gastroenterology;  Laterality: N/A;   ESOPHAGOGASTRODUODENOSCOPY (EGD) WITH PROPOFOL  N/A 05/26/2021   Procedure: ESOPHAGOGASTRODUODENOSCOPY (EGD) WITH PROPOFOL ;  Surgeon: Rollin Dover, MD;  Location: [facility] ENDOSCOPY;  Service: Endoscopy;  Laterality: N/A;   HEMOSTASIS CLIP PLACEMENT  05/26/2021   Procedure: HEMOSTASIS CLIP PLACEMENT;  Surgeon: Rollin Dover, MD;  Location: [facility] ENDOSCOPY;  Service: Endoscopy;;   HEMOSTASIS CONTROL  05/14/2021   Procedure: HEMOSTASIS CONTROL;  Surgeon: Legrand Victory LITTIE DOUGLAS, MD;  Location: [facility] ENDOSCOPY;  Service: Gastroenterology;;   HOT HEMOSTASIS N/A 05/14/2021   Procedure: HOT HEMOSTASIS (ARGON PLASMA COAGULATION/BICAP);  Surgeon: Legrand Victory LITTIE DOUGLAS, MD;  Location: [facility] ENDOSCOPY;  Service: Gastroenterology;  Laterality: N/A;   HOT HEMOSTASIS N/A 05/26/2021   Procedure: HOT HEMOSTASIS (ARGON PLASMA COAGULATION/BICAP);  Surgeon: Rollin Dover, MD;  Location: [facility] ENDOSCOPY;  Service: Endoscopy;  Laterality: N/A;   ORIF METACARPAL FRACTURE  2012   left   RADIOACTIVE SEED GUIDED PARTIAL MASTECTOMY WITH AXILLARY SENTINEL LYMPH NODE BIOPSY Right 12/17/2014   Procedure: RIGHT BREAST RADIOACTIVE SEED LOCALIZED LUMPECTOMY AND SENTINEL NODE MAPPING;  Surgeon: Deward Null III, MD;  Location: [facility];  Service: General;  Laterality: Right;   SCLEROTHERAPY  05/26/2021   Procedure: MATIAS;  Surgeon: Rollin Dover, MD;  Location: [facility] ENDOSCOPY;  Service: Endoscopy;;     Social History:   reports that she quit smoking about 12 years ago. Her smoking use included cigarettes. She has quit using smokeless tobacco.  Her smokeless tobacco use included snuff. She reports that she does not drink alcohol and does not use drugs.   Family History:  Her family history includes Breast cancer (age of onset: 32) in her sister; Cancer in her brother and mother; Heart attack in her father; Lung cancer (age of onset: 94) in her brother; Stomach cancer (age of onset: 3) in her mother.   Allergies Allergies  Allergen Reactions   Tape Itching and Rash     Home Medications  Prior to Admission medications   Medication Sig Start Date End Date Taking? Authorizing Provider  acetaminophen  (TYLENOL ) 650 MG CR tablet Take 1,300 mg by mouth every 8 (eight) hours as needed for pain.   Yes [provider]  amLODipine  (NORVASC ) 10 MG tablet Take 1 tablet (10 mg total) by mouth daily. 05/17/21  Yes Gonfa, Taye T, MD  atorvastatin  (LIPITOR) 40 MG tablet Take 40 mg by mouth daily.   Yes [provider]  calcitRIOL (ROCALTROL) 0.25 MCG capsule Take 0.25 mcg by mouth daily. 12/22/22  Yes [provider]  Camphor-Menthol-Methyl Sal (SALONPAS EX) Apply 1 patch topically as needed (pain).   Yes [provider]  cycloSPORINE (RESTASIS) 0.05 % ophthalmic emulsion Place 1 drop into both eyes daily.   Yes [provider]  ferrous sulfate  325 (65 FE) MG tablet Take 1 tablet (325 mg total) by mouth daily with breakfast. 05/31/21  Yes Amin, Ankit C, MD  furosemide  (LASIX ) 40 MG tablet Take 1 tablet (40 mg total) by mouth daily as needed for fluid (ankle swelling). Patient taking differently: Take 40 mg by mouth 2 (two) times daily. 05/23/21  Yes Gonfa, Taye T, MD  hydrALAZINE   (APRESOLINE ) 100 MG tablet Take 100 mg by mouth 2 (two) times daily.   Yes [provider]  metoprolol  succinate (TOPROL -XL) 25 MG 24 hr tablet Take 25 mg by mouth daily. 12/22/22  Yes [provider]  pantoprazole  (PROTONIX ) 40 MG tablet Take 40 mg by mouth daily.  Yes [provider]  senna-docusate (SENOKOT-S) 8.6-50 MG tablet Take 1 tablet by mouth at bedtime as needed for mild constipation or moderate constipation. 05/30/21  Yes Amin, Ankit C, MD  traZODone  (DESYREL ) 50 MG tablet Take 50 mg by mouth at bedtime. 01/19/23  Yes [provider]  mirtazapine  (REMERON ) 30 MG tablet Take 30 mg by mouth at bedtime. Patient not taking: Reported on 10/21/2024 04/07/21   [provider]  sertraline  (ZOLOFT ) 100 MG tablet Take 100 mg by mouth every morning. Patient not taking: Reported on 10/21/2024    [provider]     Critical care time: 74 m      Harden Staff MD. [facility]. [facility] Pager : [phone]  If no response to pager , please call [phone] until 7 pm After 7:00 pm call Elink  [phone]   10/21/2024

## 2024-10-21 NOTE — ED Notes (Signed)
 Consent form placed in medical records drawer in ED [place] [facility].

## 2024-10-21 NOTE — Plan of Care (Signed)
 Spoke with the patient's daughter and son this evening alongside Dr. Melia.  I am the patient's outpatient nephrologist for the past several years.  She has expressed not wanting to do dialysis.  We had referred her to palliative care and she had been seeing them outpatient.  On the last two office visits, I brought her daughter from the waiting room into the exam room to supplement her history and to confirm family knowledge of that decision.    The patient is not able to communicate her wishes at this time   I let them know that from a nephrology standpoint, I do recommend a transition to comfort care in keeping with her wishes to never be on dialysis.    Her son is understandably frustrated and wants his mother to have whatever will keep her with them longer.  Her son and daughter have understandably had a hard time with this.   I stepped out of the room to allow them to process things and talk more as a family.  They continued their discussion with Dr. Melia.   Katheryn JAYSON Saba, MD 5:59 PM 10/21/2024

## 2024-10-21 NOTE — Consult Note (Signed)
 Palliative Care Consult Note                                  Date: 10/21/2024   Patient Name: Erica Whitney  DOB:[birth year]  FMW:[id number]  Age / Sex:83 y.o., female  PCP: [facility] Referring Physician: Claudene Maximino LABOR, MD  Reason for Consultation: Establishing goals of care  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   Chronic kidney disease (CKD), stage IV (severe) (HCC)    Depression    Diabetes mellitus    Hypercholesteremia    Hypertension    Insomnia    Peptic ulcer    Uterine cancer (HCC)    dx in her 82s   Wears glasses    Assessment & Plan:   HPI/Patient Profile: 83 y.o. 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 Family reports patient was in usual state of health in the last couple of days and started exhibiting shortness of breath and altered mental status acutely in the last 24 hours. Of note patient has previously stated she does not want dialysis due to her daughter being a dialysis patient and family confirms that has been her wishes.   Elevated BUN of 137 from baseline of 50-70s. Creatinine at 10.15 from baseline of 3.86-4.86. Metabolic acidosis with ABG of 2.76/68/795/86. Elevated BNP 2214 and high sensitivity troponin continues to trend up with current peak of 408 from 198.   Palliative medicine consulted for goals of care conversation.   SUMMARY OF RECOMMENDATIONS   DNR - limited Family wishes to do a limited time trial of dialysis knowing that she is a poor candidate for long term dialysis Continue to follow for goals of  care  Symptom Management:  Per primary team  Code Status: DNR - Limited (DNR/DNI)  Prognosis:  Unable to determine  Discharge Planning:  To Be Determined   Discussed with: Claudene MD, Esequiel NP, Freya OBIE Saba MD  Subjective:   Reviewed medical records, received report from team, assessed the patient and then meet at the patient's bedside to discuss diagnosis, prognosis, GOC, EOL wishes disposition and options.  Before meeting with the patient/family, I spent time reviewing the chart notes including H&P, ED notes, RN notes, RT notes, cardiology notes, and nephrology notes . I also reviewed vital signs, nursing flowsheets, medication administrations record, labs, and imaging.  I met with patient, daughter Elijio) and son Bard) at bedside.    We meet to discuss diagnosis prognosis, GOC, EOL wishes, disposition and options. Concept of Palliative Care was introduced as specialized medical care for people and their families living with serious illness.  If focuses on providing relief from the symptoms and stress of a serious illness.  The goal is to improve quality of life for both the patient and the family. Values and goals of care important to patient and family were attempted to be elicited.  Created space and opportunity for patient  and family to explore thoughts and feelings regarding current medical situation   Natural trajectory and current clinical status were discussed. Questions  and concerns addressed. Patient encouraged to call with questions or concerns.    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 -  Has 4 children ([place], Bernie, and Nutritional Therapist, and a daughter who has passed away with ESRD on HD) - Not married  Baseline Status: - Daughter reports her mother remains active even in her advanced age, continues to perform ADLs without assistance except cooking - Daughter provides food for her so she does not have to cook - Daughter reports about an 80 lb weight loss in the last year - Daughter reports that she continues to have a good appetite and eats most of her meals - Daughter reports that she is still cognitively intact at baseline but does say managing her medications has been difficult which as resulted in difficulty maintaining compliance  Today's Discussion: - Discussed the patient's overall health over the last year, current acute decline, health goals and code status - Family says medically she has been stable, but does not seem concerned that she has lost 80 lbs in the last year - Family reports her acute decline was unexpected as she was in her usual state of health just days prior to her current presentation - Daughter reports that the patient has not discussed her healthcare goals except for not wanting dialysis - Discussed code status with family and they agree with DNR/DNI, but wants to continue current interventions and are open to all available medical interventions the medical team has to offer to prolong the life of the patient - Educated family that the patient is most likely a poor candidate for long term dialysis as an outpatient given her history of questionable compliance due to her cognitive issues and confusion and polypharmacy - Family requesting temporary dialysis trial knowing that if she were to continue to decompensate and pass they would be accepting of that fate, but if she were to pursue long term HD as outpatient they said they would support her and ensure that she makes it to her appointments - Family expressed some mistrust of the medical team as son stated that  the medical team just wants to move let her die so we can have an empty room for another patient  - Reiterated that the medical team is only recommending things out of the best interest of the patient and family as well as her previous expressed desire to not pursue dialysis  Goals: - Family wants her to live for as long as possible knowing that her time is limited  Review of Systems  Unable to perform ROS   Objective:   Primary Diagnoses: Present on Admission:  Acute respiratory failure with hypoxia (HCC)  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     Appearance: She is ill-appearing.     Comments: Cachectic, chronically ill-appearing, uncomfortable on NIPPV.   HENT:     Head: Normocephalic.  Eyes:     Extraocular Movements: Extraocular movements intact.  Cardiovascular:     Rate and Rhythm: Normal rate.  Pulmonary:     Effort: Tachypnea present.     Comments: On non-invasive positive pressure ventilation.  Skin:    General: Skin is warm.  Neurological:     Mental Status: She is disoriented.  Psychiatric:        Mood and Affect: Mood is anxious.     Palliative Assessment/Data: 60%   Thank you for allowing us  to participate in the care of Linnell JINNY Pay PMT will continue to support holistically.  Billing based on MDM: High  Problems Addressed: One or more chronic illnesses with severe exacerbation, progression, or side effects of treatment.  Amount and/or Complexity of Data: Category 1:Review of  prior external note(s) from each unique source, Review of the result(s) of each unique test, and Assessment requiring an independent historian(s) and Category 3:Discussion of management or test interpretation with external physician/other qualified health care professional/appropriate source (not separately reported)  Risks: Decision not to resuscitate or to de-escalate care because of poor prognosis  Detailed review of medical records (labs, imaging, vital signs), medically appropriate exam, discussed with treatment team, counseling and education to patient, family, & staff, documenting clinical information, medication management, coordination of care.  Signed by: Fairy FORBES Shan DEVONNA Palliative Medicine Team  Team Phone # [phone] (Nights/Weekends)  10/21/2024, 4:52 PM

## 2024-10-21 NOTE — Progress Notes (Signed)
 RT transitioned Pt to [place] 5L but did not tolerate. Pt showing signs of distress and no following any commands. Pt placed back on BiPAP at this time. MD and RN Aware

## 2024-10-21 NOTE — ED Notes (Signed)
 RT at bedside, pt placed on bipap

## 2024-10-21 NOTE — Procedures (Signed)
 Central Venous Catheter Insertion Procedure Note  Erica Whitney  [id number]  1941-04-22  Date:10/21/24  Time:6:39 PM   Provider Performing:Erica Whitney   Procedure: Insertion of Non-tunneled Central Venous Catheter(36556)with US  guidance (23062)    Indication(s) Medication administration  Consent Risks of the procedure as well as the alternatives and risks of each were explained to the patient and/or caregiver.  Consent for the procedure was obtained and is signed in the bedside chart  Anesthesia Topical only with 1% lidocaine    Timeout Verified patient identification, verified procedure, site/side was marked, verified correct patient position, special equipment/implants available, medications/allergies/relevant history reviewed, required imaging and test results available.  Sterile Technique Maximal sterile technique including full sterile barrier drape, hand hygiene, sterile gown, sterile gloves, mask, hair covering, sterile ultrasound probe cover (if used).  Procedure Description Area of catheter insertion was cleaned with chlorhexidine  and draped in sterile fashion.   With real-time ultrasound guidance a HD catheter was placed into the right femoral vein.  Nonpulsatile blood flow and easy flushing noted in all ports.  The catheter was sutured in place and sterile dressing applied.  Complications/Tolerance None; patient tolerated the procedure well. Chest X-ray is ordered to verify placement for internal jugular or subclavian cannulation.  Chest x-ray is not ordered for femoral cannulation.  EBL Minimal  Specimen(s) None     Erica Whitney, AGACNP-BC [facility]  See Amion for personal pager PCCM on call pager [phone] until 7pm. Please call Elink 7p-7a. [phone]  10/21/2024 6:41 PM

## 2024-10-21 NOTE — Progress Notes (Signed)
 PT on BIPAP at this time.   10/21/24 2200  BiPAP/CPAP/SIPAP  $ Non-Invasive Ventilator  Non-Invasive Vent Initial  $ Face Mask Small Yes  BiPAP/CPAP/SIPAP Pt Type Adult  BiPAP/CPAP/SIPAP SERVO  Mask Type Full face mask  Dentures removed? Not applicable  Mask Size Small  Set Rate 0 breaths/min  Respiratory Rate 22 breaths/min  IPAP 0 cmH20  EPAP 0 cmH2O  Pressure Support 8 cmH20  PEEP 5 cmH20  FiO2 (%) 50 %  Flow Rate 0 lpm  Minute Ventilation 13.7  Leak 11  Peak Inspiratory Pressure (PIP) 13  Tidal Volume (Vt) 693  Patient Home Machine No  Patient Home Mask No  Patient Home Tubing No  Auto Titrate No  Press High Alarm 20 cmH2O  Press Low Alarm 2 cmH2O  Nasal massage performed Yes  CPAP/SIPAP surface wiped down Yes  Device Plugged into RED Power Outlet Yes

## 2024-10-21 NOTE — ED Notes (Signed)
 Awaiting NP team to come see patient. Transport to floor delayed. RT at bedside.

## 2024-10-21 NOTE — ED Notes (Signed)
 Bleeding noted to dialysis catheter site. Secure chat sent to Claudene, MD. ED secretary asked to page out doctor STAT.

## 2024-10-21 NOTE — ED Notes (Signed)
 MD notified of held status for all PO medications, as patient is on bipap still.

## 2024-10-21 NOTE — ED Notes (Signed)
 MD at bedside.

## 2024-10-21 NOTE — ED Notes (Signed)
 RT at bedside. RT notified this RN with concerns in change to patient mental status. Pt alert but is now not answering any questions at all. Claudene, MD notified. Pt still on bipap.

## 2024-10-21 NOTE — Progress Notes (Signed)
 Pt transported from ED via BiPAP to 109M w/o complications. RT and RN x2 at bedside.

## 2024-10-21 NOTE — ED Notes (Signed)
 RT at bedside as bipap machine going alarming. Pt family requesting for patient to drink but this RN and RT at bedside educated patient and family on importance of NPO status on bipap/aspiration risks. RT explaining to patient that the bipap machine will likely still need to be on at this time.

## 2024-10-21 NOTE — ED Notes (Signed)
Patient placed onto cardiac monitor

## 2024-10-21 NOTE — Consult Note (Signed)
 Reason for Consult: Acute decompensated congestive heart failure with minimally elevated high-sensitivity troponin I in the setting of acute on chronic renal failure/hypertensive emergency Referring Physician: EDP  Shamara ELLYANNA Erica Whitney is an 83 y.o. female.  HPI: Patient is a 83 year old female with past medical history significant for multiple medical problems i.e. 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  Patient also was placed on BiPAP with improvement in her oxygenation to 100%.  Patient denies any chest pain nausea vomiting diaphoresis.  Per daughter she was doing okay when she saw her last night this morning her breathing got worse so called EMS.  Patient gives history of orthopnea and denies any significant leg swelling.  Denies any palpitations patient denies any fever or chills but complains of nonproductive coughing.  EKG done in the ED showed normal sinus rhythm with PVCs and nonspecific ST-T wave changes no significant acute ischemic changes were noted patient was noted to have elevated BNP and high-sensitivity troponin I.  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   Chronic kidney disease (CKD), stage IV (severe) (HCC)    Depression     Diabetes mellitus    Hypercholesteremia    Hypertension    Insomnia    Peptic ulcer    Uterine cancer (HCC)    dx in her 50s   Wears glasses     Past Surgical History:  Procedure Laterality Date   ABDOMINAL HYSTERECTOMY  1995   BACK SURGERY  2000   lumb lam   BIOPSY  05/14/2021   Procedure: BIOPSY;  Surgeon: Legrand Victory LITTIE DOUGLAS, MD;  Location: [facility] ENDOSCOPY;  Service: Gastroenterology;;   BREAST LUMPECTOMY Right 2015   COLONOSCOPY     ESOPHAGOGASTRODUODENOSCOPY N/A 05/14/2021   Procedure: ESOPHAGOGASTRODUODENOSCOPY (EGD);  Surgeon: Legrand Victory LITTIE DOUGLAS, MD;  Location: [facility] ENDOSCOPY;  Service: Gastroenterology;  Laterality: N/A;   ESOPHAGOGASTRODUODENOSCOPY (EGD) WITH PROPOFOL  N/A 05/26/2021   Procedure: ESOPHAGOGASTRODUODENOSCOPY (EGD) WITH PROPOFOL ;  Surgeon: Rollin Dover, MD;  Location: [facility] ENDOSCOPY;  Service: Endoscopy;  Laterality: N/A;   HEMOSTASIS CLIP PLACEMENT  05/26/2021   Procedure: HEMOSTASIS CLIP PLACEMENT;  Surgeon: Rollin Dover, MD;  [facility];  Service: Endoscopy;;   HEMOSTASIS CONTROL  05/14/2021   Procedure: HEMOSTASIS CONTROL;  Surgeon: Legrand Victory LITTIE DOUGLAS, MD;  Location: [facility] ENDOSCOPY;  Service: Gastroenterology;;   HOT HEMOSTASIS N/A 05/14/2021   Procedure: HOT HEMOSTASIS (ARGON PLASMA COAGULATION/BICAP);  Surgeon: Legrand Victory LITTIE DOUGLAS, MD;  Location: [facility] ENDOSCOPY;  Service: Gastroenterology;  Laterality: N/A;   HOT HEMOSTASIS N/A 05/26/2021   Procedure: HOT HEMOSTASIS (ARGON PLASMA COAGULATION/BICAP);  Surgeon: Rollin Dover, MD;  Location: [facility] ENDOSCOPY;  Service: Endoscopy;  Laterality: N/A;   ORIF METACARPAL FRACTURE  2012   left   RADIOACTIVE SEED GUIDED PARTIAL  MASTECTOMY WITH AXILLARY SENTINEL LYMPH NODE BIOPSY Right 12/17/2014   Procedure: RIGHT BREAST RADIOACTIVE SEED LOCALIZED LUMPECTOMY AND SENTINEL NODE MAPPING;  Surgeon: Deward Null III, MD;  Location: [facility];  Service: General;  Laterality: Right;   SCLEROTHERAPY  05/26/2021    Procedure: MATIAS;  Surgeon: Rollin Dover, MD;  Location: [facility] ENDOSCOPY;  Service: Endoscopy;;    Family History  Problem Relation Age of Onset   Cancer Mother    Stomach cancer Mother 40   Heart attack Father    Cancer Brother    Lung cancer Brother 57   Breast cancer Sister 68    Social History:  reports that she quit smoking about 12 years ago. Her smoking use included cigarettes. She has quit using smokeless tobacco.  Her smokeless tobacco use included snuff. She reports that she does not drink alcohol and does not use drugs.  Allergies:  Allergies  Allergen Reactions   Tape Itching and Rash    Medications: I have reviewed the patient's current medications.  Results for orders placed or performed during the hospital encounter of 10/21/24 (from the past 48 hours)  Resp panel by RT-PCR (RSV, Flu A&B, Covid) Anterior Nasal Swab     Status: None   Collection Time: 10/21/24  8:06 AM   Specimen: Anterior Nasal Swab  Result Value Ref Range   SARS Coronavirus 2 by RT PCR NEGATIVE NEGATIVE   Influenza A by PCR NEGATIVE NEGATIVE   Influenza B by PCR NEGATIVE NEGATIVE    Comment: (NOTE) The Xpert Xpress SARS-CoV-2/FLU/RSV plus assay is intended as an aid in the diagnosis of influenza from Nasopharyngeal swab specimens and should not be used as a sole basis for treatment. Nasal washings and aspirates are unacceptable for Xpert Xpress SARS-CoV-2/FLU/RSV testing.  Fact Sheet for Patients: bloggercourse.com  Fact Sheet for Healthcare Providers: seriousbroker.it  This test is not yet approved or cleared by the [place]  FDA and has been authorized for detection and/or diagnosis of SARS-CoV-2 by FDA under an Emergency Use Authorization (EUA). This EUA will remain in effect (meaning this test can be used) for the duration of the COVID-19 declaration under Section 564(b)(1) of the Act, 21 U.S.C. section 360bbb-3(b)(1),  unless the authorization is terminated or revoked.     Resp Syncytial Virus by PCR NEGATIVE NEGATIVE    Comment: (NOTE) Fact Sheet for Patients: bloggercourse.com  Fact Sheet for Healthcare Providers: seriousbroker.it  This test is not yet approved or cleared by the [place]  FDA and has been authorized for detection and/or diagnosis of SARS-CoV-2 by FDA under an Emergency Use Authorization (EUA). This EUA will remain in effect (meaning this test can be used) for the duration of the COVID-19 declaration under Section 564(b)(1) of the Act, 21 U.S.C. section 360bbb-3(b)(1), unless the authorization is terminated or revoked.  Performed at [facility], [address], [place], [place] [address]   Troponin I (High Sensitivity)     Status: Abnormal   Collection Time: 10/21/24  8:17 AM  Result Value Ref Range   Troponin I (High Sensitivity) 198 (HH) <18 ng/L    Comment: CRITICAL RESULT CALLED TO, READ BACK BY AND VERIFIED WITH THORNTON,S RN [phone] 10/21/24 AMIREHSANIF (NOTE) Elevated high sensitivity troponin I (hsTnI) values and significant  changes across serial measurements may suggest ACS but many other  chronic and acute conditions are known to elevate hsTnI results.  Refer to the Links section for chest pain algorithms and additional  guidance. Performed at [facility]  [facility], [address], [place], [place] [address]   CBC with Differential     Status: Abnormal   Collection Time: 10/21/24  8:17 AM  Result Value Ref Range   WBC 6.6 4.0 - 10.5 K/uL   RBC 3.04 (L) 3.87 - 5.11 MIL/uL   Hemoglobin 9.4 (L) 12.0 - 15.0 g/dL   HCT 69.9 (L) 63.9 - 53.9 %   MCV 98.7 80.0 - 100.0 fL   MCH 30.9 26.0 - 34.0 pg   MCHC 31.3 30.0 - 36.0 g/dL   RDW 81.4 (H) 88.4 - 84.4 %   Platelets 120 (L) 150 - 400 K/uL   nRBC 0.0 0.0 - 0.2 %   Neutrophils Relative % 82 %   Neutro Abs 5.4 1.7 - 7.7 K/uL   Lymphocytes Relative 6 %    Lymphs Abs 0.4 (L) 0.7 - 4.0 K/uL   Monocytes Relative 11 %   Monocytes Absolute 0.7 0.1 - 1.0 K/uL   Eosinophils Relative 0 %   Eosinophils Absolute 0.0 0.0 - 0.5 K/uL   Basophils Relative 0 %   Basophils Absolute 0.0 0.0 - 0.1 K/uL   Immature Granulocytes 1 %   Abs Immature Granulocytes 0.05 0.00 - 0.07 K/uL    Comment: Performed at [facility], [address], [place], [place] [address]  Comprehensive metabolic panel     Status: Abnormal   Collection Time: 10/21/24  8:17 AM  Result Value Ref Range   Sodium 145 135 - 145 mmol/L   Potassium 4.1 3.5 - 5.1 mmol/L   Chloride 115 (H) 98 - 111 mmol/L   CO2 13 (L) 22 - 32 mmol/L   Glucose, Bld 169 (H) 70 - 99 mg/dL    Comment: Glucose reference range applies only to samples taken after fasting for at least 8 hours.   BUN 137 (H) 8 - 23 mg/dL   Creatinine, Ser 89.84 (H) 0.44 - 1.00 mg/dL   Calcium  8.3 (L) 8.9 - 10.3 mg/dL   Total Protein 6.6 6.5 - 8.1 g/dL   Albumin 3.3 (L) 3.5 - 5.0 g/dL   AST 40 15 - 41 U/L   ALT 24 0 - 44 U/L   Alkaline Phosphatase 45 38 - 126 U/L   Total Bilirubin 0.9 0.0 - 1.2 mg/dL   GFR, Estimated 4 (L) >60 mL/min    Comment: (NOTE) Calculated using the CKD-EPI Creatinine Equation (2021)    Anion gap 17 (H) 5 - 15    Comment: Performed at [facility], [address] [place], [place] [address]  Brain natriuretic peptide     Status: Abnormal   Collection Time: 10/21/24  8:17 AM  Result Value Ref Range   B Natriuretic Peptide 2,214.4 (H) 0.0 - 100.0 pg/mL    Comment: Performed at [facility], [address] [place], [place] [address]  I-Stat venous blood gas, [facility] ED, [facility], [facility])     Status: Abnormal   Collection Time: 10/21/24  8:23 AM  Result Value Ref Range   pH, Ven 7.278 7.25 - 7.43   pCO2, Ven 26.3 (L) 44 - 60 mmHg   pO2, Ven 115 (H) 32 - 45 mmHg   Bicarbonate 12.3 (L) 20.0 - 28.0 mmol/L   TCO2 13 (L) 22 - 32 mmol/L   O2 Saturation 98 %   Acid-base deficit 13.0 (H) 0.0 - 2.0  mmol/L   Sodium 146 (H) 135 - 145 mmol/L   Potassium 4.1 3.5 -  5.1 mmol/L   Calcium , Ion 1.09 (L) 1.15 - 1.40 mmol/L   HCT 28.0 (L) 36.0 - 46.0 %   Hemoglobin 9.5 (L) 12.0 - 15.0 g/dL   Sample type VENOUS     DG Chest Portable 1 View Result Date: 10/21/2024 EXAM: 1 VIEW(S) XRAY OF THE CHEST 10/21/2024 08:50:00 AM COMPARISON: 01/26/2023 CLINICAL HISTORY: SOB FINDINGS: LUNGS AND PLEURA: Diffuse interstitial and hazy airspace opacities throughout lungs. ncreased peripheral septal markings identified within the lower lung zones are compatible with interstitial edema. No pleural effusion. No pneumothorax. HEART AND MEDIASTINUM: Mild cardiomegaly. Atherosclerotic calcifications of aortic arch. BONES AND SOFT TISSUES: Multilevel degenerative disc disease of spine. Scoliotic curvature. IMPRESSION: 1. Diffuse interstitial and hazy airspace opacities throughout the lungs, compatible with interstitial edema. Superimposed infection not excluded. 2. Mild cardiomegaly. Electronically signed by: Waddell Calk MD 10/21/2024 08:55 AM EST RP Workstation: [id number]    Review of Systems  Unable to perform ROS: Acuity of condition   Blood pressure (!) 140/118, pulse 73, temperature 98.2 F (36.8 C), temperature source Axillary, resp. rate (!) 22, SpO2 99%. Physical Exam HENT:     Head: Normocephalic and atraumatic.  Eyes:     Extraocular Movements: Extraocular movements intact.     Pupils: Pupils are equal, round, and reactive to light.  Neck:     Vascular: JVD present.  Cardiovascular:     Rate and Rhythm: Normal rate.     Heart sounds: Murmur (2/6 systolic murmur noted) heard.     Gallop (S3 gallop noted no pericardial rub) present.  Pulmonary:     Comments: Bilateral rhonchi and rales noted Abdominal:     General: Bowel sounds are normal.     Palpations: Abdomen is soft.  Musculoskeletal:     Cervical back: Normal range of motion and neck supple.     Comments: No clubbing cyanosis 1+ edema left  leg     Assessment/Plan: Hypertensive emergency complicated by acute pulmonary edema secondary to noncompliance to medication Minimally elevated troponin I secondary to above doubt significant MI Acute hypoxic respiratory failure secondary to above rule out pneumonia Acute on chronic kidney disease Hypertension Diabetes mellitus Hyperlipidemia Anemia of chronic disease History of uterine and breast carcinoma in the past History of peptic ulcer disease Degenerative joint disease Plan Check serial enzymes and EKG and lipid panel Check 2D echo check for LV systolic/diastolic dysfunction and wall motion abnormalities Agree with IV nitro drip Start BiDil 1 tablet 3 times daily Renal consult Discussed briefly with daughter regarding hemodialysis absolutely refuses for that. Patient not the candidate for any cardiac interventions we will treat medically Will add beta-blockers once fully compensated Wean off BiPAP as tolerated Marnesha Gagen, Rober 10/21/2024, 10:19 AM

## 2024-10-21 NOTE — ED Notes (Signed)
 Claudene, MD at bedside assessing patient and talking to family member. Awaiting new orders. No urine output at this time despite previous administration of Lasix .

## 2024-10-21 NOTE — ED Provider Notes (Signed)
 [place] EMERGENCY DEPARTMENT AT [facility] Provider Note   CSN: [id number] Arrival date & time: 10/21/24  0754     History  Chief Complaint  Patient presents with   Shortness of Breath    Erica Whitney is a 83 y.o. 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 Per family pt has been noncompliant with her medicines, including antihypertensives, lasix . She has refused dialysis in the past. EMS BP 240/120. 1 nitroglycerin PTA and on arrival blood pressure 152/126 which shortly increases again to 226/115. Pt denies f/c, chest pain, nausea/vomiting. Endorses nonproductive cough and orthopnea.    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   Chronic kidney disease (CKD), stage IV (severe) (HCC)    Depression    Diabetes mellitus    Hypercholesteremia    Hypertension    Insomnia    Peptic ulcer    Uterine cancer (HCC)    dx in her 28s   Wears glasses        Home Medications Prior to Admission medications   Medication Sig Start Date End Date Taking? Authorizing Provider  acetaminophen  (TYLENOL ) 650 MG CR tablet Take 1,300 mg by mouth every 8 (eight) hours as needed for pain.   Yes [provider]  amLODipine  (NORVASC ) 10 MG tablet Take 1 tablet (10 mg total) by mouth daily. 05/17/21  Yes Gonfa, Taye T, MD  atorvastatin  (LIPITOR) 40 MG tablet Take 40 mg by mouth daily.   Yes [provider]  calcitRIOL (ROCALTROL) 0.25 MCG capsule Take 0.25 mcg by mouth daily. 12/22/22  Yes [provider]   Camphor-Menthol-Methyl Sal (SALONPAS EX) Apply 1 patch topically as needed (pain).   Yes [provider]  cycloSPORINE (RESTASIS) 0.05 % ophthalmic emulsion Place 1 drop into both eyes daily.   Yes [provider]  ferrous sulfate  325 (65 FE) MG tablet Take 1 tablet (325 mg total) by mouth daily with breakfast. 05/31/21  Yes Amin, Ankit C, MD  furosemide  (LASIX ) 40 MG tablet Take 1 tablet (40 mg total) by mouth daily as needed for fluid (ankle swelling). Patient taking differently: Take 40 mg by mouth 2 (two) times daily. 05/23/21  Yes Gonfa, Taye T, MD  hydrALAZINE  (APRESOLINE ) 100 MG tablet Take 100 mg by mouth 2 (two) times daily.   Yes [provider]  metoprolol  succinate (TOPROL -XL) 25 MG 24 hr tablet Take 25 mg by mouth daily. 12/22/22  Yes [provider]  pantoprazole  (PROTONIX ) 40 MG tablet Take 40 mg by mouth daily.   Yes [provider]  senna-docusate (SENOKOT-S) 8.6-50 MG tablet Take 1 tablet by mouth at bedtime as needed for mild constipation or moderate constipation. 05/30/21  Yes Amin, Ankit C, MD  traZODone  (DESYREL ) 50 MG tablet Take 50 mg by mouth at bedtime. 01/19/23  Yes [provider]  mirtazapine  (REMERON ) 30 MG tablet Take 30 mg by mouth at bedtime. Patient not taking: Reported on 10/21/2024 04/07/21   [provider]  sertraline  (ZOLOFT ) 100 MG tablet Take 100 mg by mouth  every morning. Patient not taking: Reported on 10/21/2024    [provider]      Allergies    Tape    Review of Systems   Review of Systems A 10 point review of systems was performed and is negative unless otherwise reported in HPI.  Physical Exam Updated Vital Signs BP (!) 126/111   Pulse 70   Temp 98.2 F (36.8 C) (Axillary)   Resp (!) 21   SpO2 100%  Physical Exam General: Normal appearing female, lying in bed.  HEENT: PERRLA, Sclera anicteric, MMM, trachea midline.  Cardiology: RRR, no murmurs/rubs/gallops.  Resp:  Tachypneic, breathing every few words, with coarse rales bilaterally.  On BiPAP. Abd: Soft, non-tender, non-distended. No rebound tenderness or guarding.  GU: Deferred. MSK: 2+ pitting symmetric BL LE peripheral edema with no signs of trauma. Extremities without deformity or TTP. No cyanosis or clubbing. Skin: warm, dry.  Neuro: A&Ox4, CNs II-XII grossly intact. MAEs. Sensation grossly intact.  Psych: Normal mood and affect.   ED Results / Procedures / Treatments   Labs (all labs ordered are listed, but only abnormal results are displayed) Labs Reviewed  CBC WITH DIFFERENTIAL/PLATELET - Abnormal; Notable for the following components:      Result Value   RBC 3.04 (*)    Hemoglobin 9.4 (*)    HCT 30.0 (*)    RDW 18.5 (*)    Platelets 120 (*)    Lymphs Abs 0.4 (*)    All other components within normal limits  COMPREHENSIVE METABOLIC PANEL WITH GFR - Abnormal; Notable for the following components:   Chloride 115 (*)    CO2 13 (*)    Glucose, Bld 169 (*)    BUN 137 (*)    Creatinine, Ser 10.15 (*)    Calcium  8.3 (*)    Albumin 3.3 (*)    GFR, Estimated 4 (*)    Anion gap 17 (*)    All other components within normal limits  BRAIN NATRIURETIC PEPTIDE - Abnormal; Notable for the following components:   B Natriuretic Peptide 2,214.4 (*)    All other components within normal limits  I-STAT VENOUS BLOOD GAS, ED - Abnormal; Notable for the following components:   pCO2, Ven 26.3 (*)    pO2, Ven 115 (*)    Bicarbonate 12.3 (*)    TCO2 13 (*)    Acid-base deficit 13.0 (*)    Sodium 146 (*)    Calcium , Ion 1.09 (*)    HCT 28.0 (*)    Hemoglobin 9.5 (*)    All other components within normal limits  TROPONIN I (HIGH SENSITIVITY) - Abnormal; Notable for the following components:   Troponin I (High Sensitivity) 198 (*)    All other components within normal limits  RESP PANEL BY RT-PCR (RSV, FLU A&B, COVID)  RVPGX2  TROPONIN I (HIGH SENSITIVITY)    EKG EKG  Interpretation Date/Time:  Tuesday October 21 2024 07:59:58 EST Ventricular Rate:  88 PR Interval:  146 QRS Duration:  99 QT Interval:  413 QTC Calculation: 500 R Axis:   13  Text Interpretation: Sinus tachycardia Multiform ventricular premature complexes Nonspecific T abnormalities, lateral leads Borderline prolonged QT interval Confirmed by Franklyn Gills [phone]) on 10/21/2024 8:04:38 AM  Radiology DG Chest Portable 1 View Result Date: 10/21/2024 EXAM: 1 VIEW(S) XRAY OF THE CHEST 10/21/2024 08:50:00 AM COMPARISON: 01/26/2023 CLINICAL HISTORY: SOB FINDINGS: LUNGS AND PLEURA: Diffuse interstitial and hazy airspace opacities throughout lungs. ncreased peripheral septal markings identified within the  lower lung zones are compatible with interstitial edema. No pleural effusion. No pneumothorax. HEART AND MEDIASTINUM: Mild cardiomegaly. Atherosclerotic calcifications of aortic arch. BONES AND SOFT TISSUES: Multilevel degenerative disc disease of spine. Scoliotic curvature. IMPRESSION: 1. Diffuse interstitial and hazy airspace opacities throughout the lungs, compatible with interstitial edema. Superimposed infection not excluded. 2. Mild cardiomegaly. Electronically signed by: Waddell Calk MD 10/21/2024 08:55 AM EST RP Workstation: [id number]    Procedures .Critical Care  Performed by: Franklyn Sid SAILOR, MD Authorized by: Franklyn Sid SAILOR, MD   Critical care provider statement:    Critical care time (minutes):  45   Critical care was necessary to treat or prevent imminent or life-threatening deterioration of the following conditions:  Renal failure, respiratory failure and cardiac failure   Critical care was time spent personally by me on the following activities:  Development of treatment plan with patient or surrogate, discussions with consultants, evaluation of patient's response to treatment, examination of patient, ordering and review of laboratory studies, ordering and review of radiographic  studies, ordering and performing treatments and interventions, pulse oximetry, re-evaluation of patient's condition, review of old charts and obtaining history from patient or surrogate   Care discussed with: admitting provider       Medications Ordered in ED Medications  nitroGLYCERIN 50 mg in dextrose  5 % 250 mL (0.2 mg/mL) infusion (0 mcg/min Intravenous Stopped 10/21/24 0853)  fentaNYL  (SUBLIMAZE ) injection 25 mcg (has no administration in time range)  furosemide  (LASIX ) injection 40 mg (40 mg Intravenous Given 10/21/24 0844)    ED Course/ Medical Decision Making/ A&P                          Medical Decision Making Amount and/or Complexity of Data Reviewed Labs: ordered. Decision-making details documented in ED Course. Radiology: ordered. Decision-making details documented in ED Course.  Risk Prescription drug management. Decision regarding hospitalization.    This patient presents to the ED for concern of resp distress, HTN; this involves an extensive number of treatment options, and is a complaint that carries with it a high risk of complications and morbidity.  I considered the following differential and admission for this acute, potentially life threatening condition.   MDM:    DDX for dyspnea includes but is not limited to:  W/ patient's extremely high BP, consider acute pulmonary edema/hypertensive crisis, given nitroglycerin by EMS and will start nitroglycerin gtt here for recurrently high BP. Also consider heart failure exacerbation; no known h/o heart failure in chart or care everywhere. No CP to indicate ACS, EKG w/o signs of ischemia, troponin is elevated to 198, likely type II NSTEMI, will consult w/ cardiology. She does have h/o cancer but no active cancer and leg swelling is BL and symmetric, w/ rales and fluid/orthopnea, PE is not highest likelihood diagnosis. No wheezing ot indicate COPD. Also considered PNA, but no fever or leukocytosis, less likely. Her Cr is  elevated to >10 from 3-4, AKI on CKD, could be from [facility] emergency or from advancing CKD, will d/w nephrology. Reassuringly no significant electrolyte derangements. Patient is pulling >600 cc on BiPAP with improved RR. Has no hypercarbia but rather low pCO2 and low bicarb w/ pH wnl, indicating metabolic acidosis compensated resp alkalosis.  Started nitroglycerin gtt with robust response, SBP in 120s mmHg, so nitroglycerin gtt stopped. Will be admitted to medicine on BIPAP{.    Clinical Course as of 10/21/24 1059  Tue Oct 21, 2024  0859 B Natriuretic PeptideROLLEN):  2,214.4 [HN]  0859 DG Chest Portable 1 View 1. Diffuse interstitial and hazy airspace opacities throughout the lungs, compatible with interstitial edema. Superimposed infection not excluded. 2. Mild cardiomegaly.   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 Levern who will see the patient in consult.  [HN]  1053 D/w Dr Jerrye w/ nephrology who stated that she saw Ms. Pavlicek in her clinic recently and she opted for conservative measures to manage her CKD stage 5 instead of dialysis. Patient is not on dialysis. Dr. Claudene made aware. [HN]    Clinical Course User Index [HN] Franklyn Sid SAILOR, MD    Labs: I Ordered, and personally interpreted labs.  The pertinent results include:  those listed above  Imaging Studies ordered: I ordered imaging studies including CXR I independently visualized and interpreted imaging. I agree with the radiologist interpretation  Additional history obtained from chart review, family at bedside.   Cardiac Monitoring: The patient was maintained on a cardiac monitor.  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   Chronic kidney disease (CKD), stage IV (severe) (HCC)    Depression    Diabetes mellitus    Hypercholesteremia    Hypertension    Insomnia    Peptic ulcer    Uterine cancer (HCC)    dx in her 54s   Wears glasses      Medicines Meds ordered this encounter  Medications   nitroGLYCERIN 50 mg in dextrose  5 % 250 mL (0.2 mg/mL) infusion   furosemide  (LASIX ) injection 40 mg   fentaNYL  (SUBLIMAZE ) injection 25 mcg    I have reviewed the patients home medicines and have made adjustments as needed  Problem List / ED Course: Problem List Items Addressed This Visit       Genitourinary   AKI (acute kidney injury)   Other Visit Diagnoses       Hypertensive emergency    -  Primary   Relevant Medications   nitroGLYCERIN 50 mg in dextrose  5 % 250 mL (0.2 mg/mL) infusion   furosemide  (LASIX ) injection 40 mg (Completed)     Acute pulmonary edema (HCC)         Non-ST elevation MI (NSTEMI) (HCC)       Relevant Medications   nitroGLYCERIN 50 mg in dextrose  5 % 250 mL (0.2 mg/mL) infusion   furosemide  (LASIX ) injection 40 mg (Completed)                   This note was created using dictation software, which may contain spelling or grammatical errors.    Franklyn Sid SAILOR, MD 10/21/24 [phone]

## 2024-10-21 NOTE — Progress Notes (Addendum)
 Patient's son arrived and talks with him and his sister at bedside made note that he wanted to attempt dialysis despite knowing what patient's wishes were of not being on dialysis.  At this time patient is altered and not able to readily reaffirm her wishes at this time.  Palliative care had been consulted and further talks with family members at bedside were pursued, but ultimately son and now daughter want their mother to at least attempt dialysis.  They request a trial of dialysis for which nephrology was made aware.  Nephrology arrange for temporary dialysis catheter placement to trial dialysis.

## 2024-10-21 NOTE — Progress Notes (Addendum)
 eLink Physician-Brief Progress Note Patient Name: Erica Whitney DOB: December 25, 1940 MRN: [id number]   Date of Service  10/21/2024  HPI/Events of Note  83 year old female with a history of CKD, essential hypertension, diabetes, and breast/uterine cancer who presents with dyspnea in the setting of acute decompensated heart failure and hypertensive emergency.  On examination, the patient has mildly tachypneic, hypertensive, saturating 100% on 50% FiO2 with nitroglycerin infusion.  BiPAP in place.  Results consistent with metabolic acidosis, kidney injury with uremia, elevated BNP and troponin.  Anemia and thrombocytopenia present.  Echo consistent with moderately reduced EF and regional wall motion abnormalities complicated by severe MR.  Chest radiograph consistent with fluid overload.  eICU Interventions  Continue to trend troponins with a.m. labs.  Maintain BiPAP, DNR/DNI  Dialysis catheter in place, anticipating dialysis.  Trial of Lasix .  Blood pressure management with IV nitroglycerin and afterload reduction with BiPAP.  Home meds-amlodipine , hydralazine , metoprolol   DVT prophylaxis with heparin GI prophylaxis with home pantoprazole    2244 - remove do not place purewick order  Intervention Category Evaluation Type: New Patient Evaluation  Jermond Burkemper 10/21/2024, 9:06 PM

## 2024-10-22 ENCOUNTER — Inpatient Hospital Stay (HOSPITAL_COMMUNITY)
Admission: RE | Admit: 2024-10-22 | Discharge: 2024-10-22 | Disposition: A | Discharge status: Home / Self Care | Source: Ambulatory Visit | Attending: Nephrology | Admitting: Nephrology

## 2024-10-22 ENCOUNTER — Inpatient Hospital Stay (HOSPITAL_COMMUNITY)

## 2024-10-22 DIAGNOSIS — J9600 Acute respiratory failure, unspecified whether with hypoxia or hypercapnia: Secondary | ICD-10-CM

## 2024-10-22 DIAGNOSIS — R4182 Altered mental status, unspecified: Secondary | ICD-10-CM | POA: Diagnosis not present

## 2024-10-22 DIAGNOSIS — J810 Acute pulmonary edema: Secondary | ICD-10-CM | POA: Diagnosis not present

## 2024-10-22 DIAGNOSIS — I255 Ischemic cardiomyopathy: Secondary | ICD-10-CM

## 2024-10-22 DIAGNOSIS — I214 Non-ST elevation (NSTEMI) myocardial infarction: Secondary | ICD-10-CM

## 2024-10-22 DIAGNOSIS — D631 Anemia in chronic kidney disease: Secondary | ICD-10-CM

## 2024-10-22 DIAGNOSIS — I160 Hypertensive urgency: Secondary | ICD-10-CM | POA: Diagnosis not present

## 2024-10-22 LAB — RENAL FUNCTION PANEL
Albumin: 3 g/dL — ABNORMAL LOW (ref 3.5–5.0)
Anion gap: 19 — ABNORMAL HIGH (ref 5–15)
BUN: 145 mg/dL — ABNORMAL HIGH (ref 8–23)
CO2: 11 mmol/L — ABNORMAL LOW (ref 22–32)
Calcium: 8.1 mg/dL — ABNORMAL LOW (ref 8.9–10.3)
Chloride: 117 mmol/L — ABNORMAL HIGH (ref 98–111)
Creatinine, Ser: 10.34 mg/dL — ABNORMAL HIGH (ref 0.44–1.00)
GFR, Estimated: 3 mL/min — ABNORMAL LOW (ref >60)
Glucose, Bld: 136 mg/dL — ABNORMAL HIGH (ref 70–99)
Phosphorus: 11.5 mg/dL — ABNORMAL HIGH (ref 2.5–4.6)
Potassium: 4.2 mmol/L (ref 3.5–5.1)
Sodium: 147 mmol/L — ABNORMAL HIGH (ref 135–145)

## 2024-10-22 LAB — TROPONIN I (HIGH SENSITIVITY)
Troponin I (High Sensitivity): 1260 ng/L (ref <18)
Troponin I (High Sensitivity): 1143 ng/L (ref <18)
Troponin I (High Sensitivity): 1073 ng/L (ref <18)

## 2024-10-22 LAB — CBC
HCT: 27.3 % — ABNORMAL LOW (ref 36.0–46.0)
Hemoglobin: 8.6 g/dL — ABNORMAL LOW (ref 12.0–15.0)
MCH: 31.2 pg (ref 26.0–34.0)
MCHC: 31.5 g/dL (ref 30.0–36.0)
MCV: 98.9 fL (ref 80.0–100.0)
Platelets: 62 K/uL — ABNORMAL LOW (ref 150–400)
RBC: 2.76 MIL/uL — ABNORMAL LOW (ref 3.87–5.11)
RDW: 18.4 % — ABNORMAL HIGH (ref 11.5–15.5)
WBC: 9.3 K/uL (ref 4.0–10.5)
nRBC: 0 % (ref 0.0–0.2)

## 2024-10-22 LAB — IRON AND TIBC
Iron: 18 ug/dL — ABNORMAL LOW (ref 28–170)
Saturation Ratios: 13 % (ref 10.4–31.8)
TIBC: 140 ug/dL — ABNORMAL LOW (ref 250–450)
UIBC: 122 ug/dL

## 2024-10-22 LAB — BASIC METABOLIC PANEL WITH GFR
Anion gap: 20 — ABNORMAL HIGH (ref 5–15)
BUN: 145 mg/dL — ABNORMAL HIGH (ref 8–23)
CO2: 10 mmol/L — ABNORMAL LOW (ref 22–32)
Calcium: 8.1 mg/dL — ABNORMAL LOW (ref 8.9–10.3)
Chloride: 116 mmol/L — ABNORMAL HIGH (ref 98–111)
Creatinine, Ser: 10.29 mg/dL — ABNORMAL HIGH (ref 0.44–1.00)
GFR, Estimated: 3 mL/min — ABNORMAL LOW (ref >60)
Glucose, Bld: 136 mg/dL — ABNORMAL HIGH (ref 70–99)
Potassium: 4.2 mmol/L (ref 3.5–5.1)
Sodium: 146 mmol/L — ABNORMAL HIGH (ref 135–145)

## 2024-10-22 LAB — MAGNESIUM: Magnesium: 1.8 mg/dL (ref 1.7–2.4)

## 2024-10-22 LAB — GLUCOSE, CAPILLARY
Glucose-Capillary: 138 mg/dL — ABNORMAL HIGH (ref 70–99)
Glucose-Capillary: 137 mg/dL — ABNORMAL HIGH (ref 70–99)
Glucose-Capillary: 126 mg/dL — ABNORMAL HIGH (ref 70–99)
Glucose-Capillary: 91 mg/dL (ref 70–99)
Glucose-Capillary: 115 mg/dL — ABNORMAL HIGH (ref 70–99)
Glucose-Capillary: 97 mg/dL (ref 70–99)

## 2024-10-22 LAB — FERRITIN: Ferritin: 273 ng/mL (ref 11–307)

## 2024-10-22 LAB — PHOSPHORUS: Phosphorus: 11.5 mg/dL — ABNORMAL HIGH (ref 2.5–4.6)

## 2024-10-22 MED ORDER — LABETALOL HCL 5 MG/ML IV SOLN
10.0000 mg | INTRAVENOUS | Status: DC | PRN
  Filled 2024-10-22: qty 4

## 2024-10-22 MED ORDER — HEPARIN SODIUM (PORCINE) 1000 UNIT/ML IJ SOLN
INTRAMUSCULAR | Status: AC
  Filled 2024-10-22: qty 6

## 2024-10-22 MED ORDER — ASPIRIN 81 MG PO TBEC
81.0000 mg | DELAYED_RELEASE_TABLET | Freq: Every day | ORAL | Status: DC
  Administered 2024-10-22 – 2024-11-01 (×11): 81 mg via ORAL
  Filled 2024-10-22 (×11): qty 1

## 2024-10-22 MED ORDER — LIDOCAINE-PRILOCAINE 2.5-2.5 % EX CREA
1.0000 | TOPICAL_CREAM | CUTANEOUS | Status: DC | PRN
Start: 2024-10-22 — End: 2024-10-23

## 2024-10-22 MED ORDER — LIDOCAINE HCL (PF) 1 % IJ SOLN: 5.0000 mL | INTRAMUSCULAR | Status: DC | PRN

## 2024-10-22 MED ORDER — NEPRO/CARBSTEADY PO LIQD
237.0000 mL | Freq: Two times a day (BID) | ORAL | Status: DC
  Administered 2024-10-22 – 2024-11-01 (×13): 237 mL via ORAL

## 2024-10-22 MED ORDER — HYDRALAZINE HCL 20 MG/ML IJ SOLN
10.0000 mg | INTRAMUSCULAR | Status: DC | PRN
  Administered 2024-10-22: 20 mg via INTRAVENOUS
  Filled 2024-10-22: qty 1

## 2024-10-22 MED ORDER — POLYVINYL ALCOHOL 1.4 % OP SOLN
1.0000 [drp] | OPHTHALMIC | Status: DC | PRN
  Administered 2024-10-22 – 2024-10-31 (×4): 1 [drp] via OPHTHALMIC
  Filled 2024-10-22: qty 15

## 2024-10-22 MED ORDER — ANTICOAGULANT SODIUM CITRATE 4% (200MG/5ML) IV SOLN: 5.0000 mL | Status: DC | PRN

## 2024-10-22 MED ORDER — HEPARIN SODIUM (PORCINE) 1000 UNIT/ML DIALYSIS
1000.0000 [IU] | INTRAMUSCULAR | Status: DC | PRN
  Administered 2024-10-22: 2800 [IU]

## 2024-10-22 MED ORDER — NITROGLYCERIN 2 % TD OINT
0.5000 [in_us] | TOPICAL_OINTMENT | Freq: Four times a day (QID) | TRANSDERMAL | Status: DC
  Administered 2024-10-22 – 2024-10-24 (×9): 0.5 [in_us] via TOPICAL
  Filled 2024-10-22: qty 30

## 2024-10-22 MED ORDER — TRAZODONE HCL 50 MG PO TABS
50.0000 mg | ORAL_TABLET | Freq: Every evening | ORAL | Status: DC | PRN
Start: 2024-10-22 — End: 2024-11-01
  Administered 2024-10-22 – 2024-10-28 (×6): 50 mg via ORAL
  Filled 2024-10-22 (×7): qty 1

## 2024-10-22 MED ORDER — ALTEPLASE 2 MG IJ SOLR: 2.0000 mg | Freq: Once | INTRAMUSCULAR | Status: DC | PRN

## 2024-10-22 MED ORDER — ATORVASTATIN CALCIUM 80 MG PO TABS
80.0000 mg | ORAL_TABLET | Freq: Every day | ORAL | Status: DC
  Administered 2024-10-22 – 2024-11-01 (×11): 80 mg via ORAL
  Filled 2024-10-22 (×6): qty 1
  Filled 2024-10-22: qty 2
  Filled 2024-10-22: qty 1
  Filled 2024-10-22: qty 2
  Filled 2024-10-22 (×2): qty 1

## 2024-10-22 MED ORDER — PENTAFLUOROPROP-TETRAFLUOROETH EX AERO
1.0000 | INHALATION_SPRAY | CUTANEOUS | Status: DC | PRN
Start: 2024-10-22 — End: 2024-10-23

## 2024-10-22 NOTE — Progress Notes (Signed)
 Patient taken off Bipap and placed on [place] 3L with humidity following HD at the bedside. Patient has no increased WOB at this time. RN at bedside.

## 2024-10-22 NOTE — Evaluation (Signed)
 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   Chronic kidney disease (CKD), stage IV (severe) (HCC)    Depression    Diabetes mellitus    Hypercholesteremia    Hypertension    Insomnia    Peptic ulcer    Uterine cancer (HCC)    dx in her 83s   Wears glasses    Past Surgical History:  Past Surgical History:  Procedure Laterality Date   ABDOMINAL HYSTERECTOMY  1995   BACK SURGERY  2000   lumb lam   BIOPSY  05/14/2021   Procedure: BIOPSY;  Surgeon: Legrand Victory LITTIE DOUGLAS, MD;  Location: [facility] ENDOSCOPY;  Service: Gastroenterology;;   BREAST LUMPECTOMY Right 2015   COLONOSCOPY     ESOPHAGOGASTRODUODENOSCOPY N/A 05/14/2021   Procedure: ESOPHAGOGASTRODUODENOSCOPY (EGD);  Surgeon: Legrand Victory LITTIE DOUGLAS, MD;  Location: [facility] ENDOSCOPY;  Service: Gastroenterology;  Laterality: N/A;   ESOPHAGOGASTRODUODENOSCOPY (EGD) WITH PROPOFOL  N/A 05/26/2021   Procedure: ESOPHAGOGASTRODUODENOSCOPY (EGD) WITH PROPOFOL ;  Surgeon: Rollin Dover, MD;  Location: [facility] ENDOSCOPY;  Service: Endoscopy;  Laterality: N/A;   HEMOSTASIS CLIP PLACEMENT  05/26/2021   Procedure: HEMOSTASIS CLIP PLACEMENT;  Surgeon: Rollin Dover, MD;  Location: [facility] ENDOSCOPY;  Service: Endoscopy;;   HEMOSTASIS CONTROL  05/14/2021   Procedure: HEMOSTASIS CONTROL;  Surgeon: Legrand Victory LITTIE DOUGLAS, MD;  Location: [facility] ENDOSCOPY;  Service: Gastroenterology;;   HOT HEMOSTASIS N/A 05/14/2021   Procedure: HOT HEMOSTASIS (ARGON PLASMA COAGULATION/BICAP);  Surgeon: Legrand Victory LITTIE DOUGLAS, MD;  Location: [facility] ENDOSCOPY;  Service:  Gastroenterology;  Laterality: N/A;   HOT HEMOSTASIS N/A 05/26/2021   Procedure: HOT HEMOSTASIS (ARGON PLASMA COAGULATION/BICAP);  Surgeon: Rollin Dover, MD;  Location: [facility] ENDOSCOPY;  Service: Endoscopy;  Laterality: N/A;   ORIF METACARPAL FRACTURE  2012   left   RADIOACTIVE SEED GUIDED PARTIAL MASTECTOMY WITH AXILLARY SENTINEL LYMPH NODE BIOPSY Right 12/17/2014   Procedure: RIGHT BREAST RADIOACTIVE SEED LOCALIZED LUMPECTOMY AND SENTINEL NODE MAPPING;  Surgeon: Deward Null III, MD;  Location:  SURGERY CENTER;  Service: General;  Laterality: Right;   SCLEROTHERAPY  05/26/2021   Procedure: MATIAS;  Surgeon: Rollin Dover, MD;  Location: [facility] ENDOSCOPY;  Service: Endoscopy;;   HPI:  83 year old with CKD 5 brought in by EMS from home due to shortness of breath for 1 day.  ED vitals were blood pressure of 226/115, afebrile, tachypnea.  Chest x-ray showed diffuse interstitial edema with cardiomegaly.  Labs significant for troponin 198, BNP 2214, BUN/creatinine 137/10.15, anion gap 17  She was seen by her primary nephrologist according to home patient had refused dialysis and multiple outpatient office visits.  She appeared confused and was not able to participate in decision making in the ED.  Son and daughter requested dialysis.  After discussion with nephrology, decision was made to initiate one-time dialysis, improve her sensorium and then reassess goals of care.  Palliative care was involved.  Pt had an episode of acute dysphagia in 2025 due to retropharyngeal swelling. Family reports pt has been eating and drinking well at home though she has had weight loss.  Assessment / Plan / Recommendation  Clinical Impression  Pt with no observable dysphagia. Able to sip water and masticate solids with small careful bites. Could not induce pt to drink 3 oz consecutively to challenge swallowing, but pt has not been intubated, is alert and self feeding and family deny baseline impairment, so risk of  aspiration or dysphagia is low. Recommend pt resume home diet. Regular texture thin liquids written. MD may want to modify to renal diet. SLP Visit Diagnosis: Dysphagia, unspecified (R13.10)    Aspiration Risk  Mild aspiration risk;Risk for inadequate nutrition/hydration (Pt has been losing weight)    Diet Recommendation Regular;Thin liquid    Liquid Administration via: Cup;Straw Medication Administration: Whole meds with liquid Supervision: Patient able to self feed Compensations: Slow rate;Small sips/bites Postural Changes: Seated upright at 90 degrees    Other  Recommendations       Assistance Recommended at Discharge    Functional Status Assessment    Frequency and Duration            Prognosis        Swallow Study   General HPI: 83 year old with CKD 5 brought in by EMS from home due to shortness of breath for 1 day.  ED vitals were blood pressure of 226/115, afebrile, tachypnea.  Chest x-ray showed diffuse interstitial edema with cardiomegaly.  Labs significant for troponin 198, BNP 2214, BUN/creatinine 137/10.15, anion gap 17  She was seen by her primary nephrologist according to home patient had refused dialysis and multiple outpatient office visits.  She appeared confused and was not able to participate in decision making in the ED.  Son and daughter requested dialysis.  After discussion with nephrology, decision was made to initiate one-time dialysis, improve her sensorium and then reassess goals of care.  Palliative care was involved.  Pt had an episode of acute dysphagia in 2025 due to retropharyngeal swelling. Family reports pt has been eating and drinking well at home though she has had weight loss. Type of Study: Bedside Swallow Evaluation Previous Swallow Assessment: see HPI Diet Prior to this Study: NPO Temperature Spikes Noted: No Respiratory Status: Nasal cannula History of Recent Intubation: No Behavior/Cognition: Alert;Cooperative;Pleasant mood Oral Cavity  Assessment: Dry Oral Care Completed by SLP: No Oral Cavity - Dentition: Adequate natural dentition Vision: Functional for self-feeding Self-Feeding Abilities: Able to feed self Patient Positioning: Upright in bed Baseline Vocal Quality: Low vocal intensity Volitional Cough: Strong Volitional Swallow: Able to elicit    Oral/Motor/Sensory Function Overall Oral Motor/Sensory Function: Within functional limits   Ice Chips     Thin Liquid Thin Liquid: Within functional limits Presentation: Straw;Cup    Nectar Thick Nectar Thick Liquid: Not tested   Honey Thick Honey Thick Liquid: Not tested   Puree Puree: Within functional limits Presentation: Self Fed;Spoon   Solid     Solid: Within functional limits Presentation: Self Fed      Destry Dauber, Consuelo Fitch 10/22/2024,12:54 PM

## 2024-10-22 NOTE — Progress Notes (Signed)
 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 Family reports patient was in usual state of health in the last couple of days and started exhibiting shortness of breath and altered mental status acutely in the last 24 hours. Of note patient has previously stated she does not want dialysis due to her daughter being a dialysis patient and family confirms that has been her wishes.   Palliative medicine consulted for goals of care discussion.   SUMMARY OF RECOMMENDATIONS DNR - limited Continue to follow for goals of care with patient and family once patient returns to baseline and is able to participate in conversation  Symptom Management:  Per primary team  Code Status: DNR - Limited (DNR/DNI)  Prognosis: Unable to determine  Discharge Planning: To Be Determined  Discussed with: Pleas MD, Esequiel NP, daughter  Subjective:   Subjective: Chart Reviewed. Updates received. Patient Assessed.  Created space and opportunity for patient  and family to explore thoughts and feelings regarding current medical situation.  Today's Discussion: Today before meeting with the patient/family, I reviewed the chart notes including critical care notes, nephrology notes. I also reviewed vital signs, nursing flowsheets, medication administrations record, labs, and imaging.   Patient was still on BiPAP during visit in the morning and getting dialysis with RN at the bedside. Patient still seemed altered during visit and uncomfortable on BiPAP. Spoke with daughter in the waiting room and she shared again that she is aware that her mother would not want this, but wants to continue dialysis for now and if her mother requested it be stopped, then she would honor it knowing her mother may be close to the end of her life and that she will respect her mother's wishes.   Review of Systems  Unable to perform ROS   Objective:   Primary Diagnoses: Present on Admission:  Acute respiratory failure with hypoxia (HCC)  Heart failure with reduced ejection fraction (HFrEF) (HCC)  Acute kidney injury superimposed on stage 4 chronic kidney disease (HCC)  Hypertensive urgency  Elevated troponin  Metabolic acidosis with increased anion gap and accumulation of organic acids  Thrombocytopenia  Depression  Insomnia  Acute metabolic encephalopathy  Renal failure   Vital Signs:  BP (!) 175/97   Pulse 86   Temp 97.9 F (36.6 C)   Resp (!) 22   Wt 50.9 kg   SpO2 100%  BMI 19.88 kg/m   Physical Exam Constitutional:      Comments: Cachectic, chronically ill appearing.   HENT:     Head: Normocephalic.  Cardiovascular:     Rate and Rhythm: Normal rate.  Pulmonary:     Effort: Pulmonary effort is normal.     Comments: On BiPAP Musculoskeletal:     Right lower leg: No edema.     Left lower leg: No edema.  Skin:    General: Skin is warm.  Neurological:     Mental Status: She is disoriented.     Palliative Assessment/Data: 50%   Existing Vynca/ACP Documentation: None  Thank you for allowing us  to participate in the care of Linnell JINNY Pay PMT will continue to support holistically.  Time Total: 35 minutes  Detailed review of medical records (labs, imaging, vital signs), medically appropriate exam, discussed with treatment team, counseling and education to patient, family, & staff, documenting clinical information, medication management, coordination of care  Fairy FORBES Shan DEVONNA  Palliative Medicine Team  Team Phone # [phone] (Nights/Weekends) 10/22/2024 3:12 PM

## 2024-10-22 NOTE — Progress Notes (Signed)
 NAME:  Erica Whitney, MRN:  [id number], DOB:  05-18-1941, LOS: 1 ADMISSION DATE:  10/21/2024, CONSULTATION DATE:  10/21/2024  REFERRING MD:  Claudene, TRH, CHIEF COMPLAINT: Respiratory distress  History of Present Illness:  83 year old with CKD 5 brought in by EMS from home due to shortness of breath for 1 day. ED vitals were blood pressure of 226/115, afebrile, tachypnea.  Chest x-ray showed diffuse interstitial edema with cardiomegaly. Labs significant for troponin 198, BNP 2214, BUN/creatinine 137/10.15, anion gap 17 She was seen by her primary nephrologist according to home patient had refused dialysis and multiple outpatient office visits.  She appeared confused and was not able to participate in decision making in the ED.  Son and daughter requested dialysis.  After discussion with nephrology, decision was made to initiate one-time dialysis, improve her sensorium and then reassess goals of care.  Palliative care was involved. PCCM was consulted and right femoral HD catheter was placed.  She was on BiPAP but subsequently became agitated, ripped off BiPAP.  PCCM was called due to bleeding from femoral line and due to agitation. She was started on dialysis on 11/5.  Pertinent  Medical History  CKD 5 Hypertension Diabetes with peripheral neuropathy Uterine cancer Breast cancer  Significant Hospital Events: Including procedures, antibiotic start and stop dates in addition to other pertinent events   Admitted on 11/4 and transferred to ICU due to agitation and necessitating nitroglycerin drip. 1st session of dialysis 11/5  Interim History / Subjective:  Patient is resting in bed on BiPAP. Able to nod head to respond to questions. She denies pain. Intermittently conversant  Seen by nephrology who are planning for HD session today with goal of 2L UF and 2nd session of HD tomorrow.  Objective    Blood pressure (!) 123/108, pulse 81, temperature (!) 96.9 F (36.1 C), temperature source  Axillary, resp. rate 16, weight 52.9 kg, SpO2 99%.    BP dropped from 200s over 110s to 120 over 106 this morning. Saturating on 100% on 40% FiO2 Vent Mode: PSV;BIPAP FiO2 (%):  [40 %-50 %] 40 % PEEP:  [5 cmH20-8 cmH20] 5 cmH20 Pressure Support:  [8 cmH20] 8 cmH20   Intake/Output Summary (Last 24 hours) at 10/22/2024 0709 Last data filed at 10/22/2024 0300 Gross per 24 hour  Intake 76.65 ml  Output 0 ml  Net 76.65 ml   Filed Weights   10/21/24 2046 10/22/24 0500  Weight: 52.1 kg 52.9 kg    Examination: General: Lying in bed in no acute distress on BiPAP, HENT: Opens eyes to voice. Sclera non-icteric Lungs: Bilateral rhonchi, No labored breathing. Cardiovascular: S1S2. RRR Abdomen: Soft, non-tender non-distended Extremities: No peripheral edema. Blood present over dressing of HD catheter Neuro: Awake, follows commands  Tropoinin increased to 1260 from 408 yesterday. Hgb 8.6 from 9.4 yesterday K 4.2  Na 147 Co2 at 11 BUN 145 Anion gap 19  Resolved problem list   Assessment and Plan   Acute hypoxic respiratory failure due to pulmonary edema CKD V now ESRD Volume overload Uremic encephalopathy Shortness of breath on presentation. Chest x-ray consistent with pulmonary edema. Tolerating BiPAP well. Goals of care discussion with family, nephrology, and palliative care resulted int he placement of femoral dialysis catheter against the patients known wishes. -Dialysis today per nephrology -Lasix  80mg  BID -Continue BiPAP, attempt to wean off BiPAP after volume removal with HD -DNR/DNI -Once patient has become less encephelopathic, plan to formalize her wishes in writing. -Chest x-ray today to evaluate for  pneumonia -Iron  panel per nephrology  Hypertensive crisis Likely due to volume overload. -Start Hydralazine  100mg  BID -Continue Nitroglycerin drip until volume removal with dialysis.  NSTEMI -Troponin elevated to 198 on admission and worsened to 1260. -BNP 2214 -EKG  showed sinus tachycardia with no ST segment elevations -Likely in the setting of volume overload/demand ischemia, also with severe MR -Trend troponin until peak, consider cardiology consultation if it continues to worsen after dialysis  Ischemic cardiomyopthy -BNP 2214 -showing EF 45 to 50% with severe MR and septal hypokinesis consistent with ischemic cardiomyopathy   Thrombocytopenia -Platelets 62 this morning, decreased from 120 yesterday. -4T score 3 -Possibly in the setting of blood loss after placement of dialysis catheter. -CBC tomorrow -HIT antibody  HLD -continue Atorvastatin  40mg   DMII Last A1C 4.4 in 2024 -SSI -BG goal 140-180  FenGI -NPO while on BiPAP -Protonix  daily 40mg  -Bowel regimen with Senokot 1 tablet daily Labs   CBC: Recent Labs  Lab 10/21/24 0817 10/21/24 0823 10/21/24 1501 10/22/24 0602  WBC 6.6  --   --  9.3  NEUTROABS 5.4  --   --   --   HGB 9.4* 9.5* 9.2* 8.6*  HCT 30.0* 28.0* 27.0* 27.3*  MCV 98.7  --   --  98.9  PLT 120*  --   --  62*    Basic Metabolic Panel: Recent Labs  Lab 10/21/24 0817 10/21/24 0823 10/21/24 1501 10/22/24 0248 10/22/24 0249  NA 145 146* 146* 146* 147*  K 4.1 4.1 4.1 4.2 4.2  CL 115*  --   --  116* 117*  CO2 13*  --   --  10* 11*  GLUCOSE 169*  --   --  136* 136*  BUN 137*  --   --  145* 145*  CREATININE 10.15*  --   --  10.29* 10.34*  CALCIUM  8.3*  --   --  8.1* 8.1*  MG  --   --   --  1.8  --   PHOS  --   --   --  11.5* 11.5*   GFR: CrCl cannot be calculated (Unknown ideal weight.). Recent Labs  Lab 10/21/24 0817 10/22/24 0602  WBC 6.6 9.3    Liver Function Tests: Recent Labs  Lab 10/21/24 0817 10/22/24 0249  AST 40  --   ALT 24  --   ALKPHOS 45  --   BILITOT 0.9  --   PROT 6.6  --   ALBUMIN 3.3* 3.0*   No results for input(s): LIPASE, AMYLASE in the last 168 hours. No results for input(s): AMMONIA in the last 168 hours.  ABG    Component Value Date/Time   PHART 7.231 (L)  10/21/2024 1501   PCO2ART 31.1 (L) 10/21/2024 1501   PO2ART 204 (H) 10/21/2024 1501   HCO3 13.0 (L) 10/21/2024 1501   TCO2 14 (L) 10/21/2024 1501   ACIDBASEDEF 13.0 (H) 10/21/2024 1501   O2SAT 100 10/21/2024 1501     Coagulation Profile: No results for input(s): INR, PROTIME in the last 168 hours.  Cardiac Enzymes: No results for input(s): CKTOTAL, CKMB, CKMBINDEX, TROPONINI in the last 168 hours.  HbA1C: Hgb A1c MFr Bld  Date/Time Value Ref Range Status  01/26/2023 02:50 AM 4.4 (L) 4.8 - 5.6 % Final    Comment:    (NOTE) Pre diabetes:          5.7%-6.4%  Diabetes:              >6.4%  Glycemic control  for   <7.0% adults with diabetes     CBG: Recent Labs  Lab 10/21/24 1420 10/21/24 2040 10/21/24 2343 10/22/24 0353  GLUCAP 126* 130* 122* 138*   Past Medical History:  She,  has a past medical history of Anemia in chronic kidney disease (CKD), Arthritis, Breast cancer (HCC), Breast cancer of upper-outer quadrant of right female breast (HCC) (11/06/2014), Chronic kidney disease (CKD), stage IV (severe) (HCC), Depression, Diabetes mellitus, Hypercholesteremia, Hypertension, Insomnia, Peptic ulcer, Uterine cancer (HCC), and Wears glasses.   Surgical History:   Past Surgical History:  Procedure Laterality Date   ABDOMINAL HYSTERECTOMY  1995   BACK SURGERY  2000   lumb lam   BIOPSY  05/14/2021   Procedure: BIOPSY;  Surgeon: Legrand Victory LITTIE DOUGLAS, MD;  Location: [facility] ENDOSCOPY;  Service: Gastroenterology;;   BREAST LUMPECTOMY Right 2015   COLONOSCOPY     ESOPHAGOGASTRODUODENOSCOPY N/A 05/14/2021   Procedure: ESOPHAGOGASTRODUODENOSCOPY (EGD);  Surgeon: Legrand Victory LITTIE DOUGLAS, MD;  Location: [facility] ENDOSCOPY;  Service: Gastroenterology;  Laterality: N/A;   ESOPHAGOGASTRODUODENOSCOPY (EGD) WITH PROPOFOL  N/A 05/26/2021   Procedure: ESOPHAGOGASTRODUODENOSCOPY (EGD) WITH PROPOFOL ;  Surgeon: Rollin Dover, MD;  Location: [facility] ENDOSCOPY;  Service: Endoscopy;  Laterality: N/A;    HEMOSTASIS CLIP PLACEMENT  05/26/2021   Procedure: HEMOSTASIS CLIP PLACEMENT;  Surgeon: Rollin Dover, MD;  Location: [facility] ENDOSCOPY;  Service: Endoscopy;;   HEMOSTASIS CONTROL  05/14/2021   Procedure: HEMOSTASIS CONTROL;  Surgeon: Legrand Victory LITTIE DOUGLAS, MD;  Location: [facility] ENDOSCOPY;  Service: Gastroenterology;;   HOT HEMOSTASIS N/A 05/14/2021   Procedure: HOT HEMOSTASIS (ARGON PLASMA COAGULATION/BICAP);  Surgeon: Legrand Victory LITTIE DOUGLAS, MD;  Location: [facility] ENDOSCOPY;  Service: Gastroenterology;  Laterality: N/A;   HOT HEMOSTASIS N/A 05/26/2021   Procedure: HOT HEMOSTASIS (ARGON PLASMA COAGULATION/BICAP);  Surgeon: Rollin Dover, MD;  Location: [facility] ENDOSCOPY;  Service: Endoscopy;  Laterality: N/A;   ORIF METACARPAL FRACTURE  2012   left   RADIOACTIVE SEED GUIDED PARTIAL MASTECTOMY WITH AXILLARY SENTINEL LYMPH NODE BIOPSY Right 12/17/2014   Procedure: RIGHT BREAST RADIOACTIVE SEED LOCALIZED LUMPECTOMY AND SENTINEL NODE MAPPING;  Surgeon: Deward Null III, MD;  Location: [facility];  Service: General;  Laterality: Right;   SCLEROTHERAPY  05/26/2021   Procedure: MATIAS;  Surgeon: Rollin Dover, MD;  Location: [facility] ENDOSCOPY;  Service: Endoscopy;;     Social History:   reports that she quit smoking about 12 years ago. Her smoking use included cigarettes. She has quit using smokeless tobacco.  Her smokeless tobacco use included snuff. She reports that she does not drink alcohol and does not use drugs.   Family History:  Her family history includes Breast cancer (age of onset: 56) in her sister; Cancer in her brother and mother; Heart attack in her father; Lung cancer (age of onset: 68) in her brother; Stomach cancer (age of onset: 29) in her mother.   Allergies Allergies  Allergen Reactions   Tape Itching and Rash     Home Medications  Prior to Admission medications   Medication Sig Start Date End Date Taking? Authorizing Provider  acetaminophen  (TYLENOL ) 650 MG CR tablet Take 1,300 mg by mouth  every 8 (eight) hours as needed for pain.   Yes [provider]  amLODipine  (NORVASC ) 10 MG tablet Take 1 tablet (10 mg total) by mouth daily. 05/17/21  Yes Gonfa, Taye T, MD  atorvastatin  (LIPITOR) 40 MG tablet Take 40 mg by mouth daily.   Yes [provider]  calcitRIOL (ROCALTROL) 0.25 MCG capsule Take 0.25  mcg by mouth daily. 12/22/22  Yes [provider]  Camphor-Menthol-Methyl Sal (SALONPAS EX) Apply 1 patch topically as needed (pain).   Yes [provider]  cycloSPORINE (RESTASIS) 0.05 % ophthalmic emulsion Place 1 drop into both eyes daily.   Yes [provider]  ferrous sulfate  325 (65 FE) MG tablet Take 1 tablet (325 mg total) by mouth daily with breakfast. 05/31/21  Yes Amin, Ankit C, MD  furosemide  (LASIX ) 40 MG tablet Take 1 tablet (40 mg total) by mouth daily as needed for fluid (ankle swelling). Patient taking differently: Take 40 mg by mouth 2 (two) times daily. 05/23/21  Yes Gonfa, Taye T, MD  hydrALAZINE  (APRESOLINE ) 100 MG tablet Take 100 mg by mouth 2 (two) times daily.   Yes [provider]  metoprolol  succinate (TOPROL -XL) 25 MG 24 hr tablet Take 25 mg by mouth daily. 12/22/22  Yes [provider]  pantoprazole  (PROTONIX ) 40 MG tablet Take 40 mg by mouth daily.   Yes [provider]  senna-docusate (SENOKOT-S) 8.6-50 MG tablet Take 1 tablet by mouth at bedtime as needed for mild constipation or moderate constipation. 05/30/21  Yes Amin, Ankit C, MD  traZODone  (DESYREL ) 50 MG tablet Take 50 mg by mouth at bedtime. 01/19/23  Yes [provider]  mirtazapine  (REMERON ) 30 MG tablet Take 30 mg by mouth at bedtime. Patient not taking: Reported on 10/21/2024 04/07/21   [provider]  sertraline  (ZOLOFT ) 100 MG tablet Take 100 mg by mouth every morning. Patient not taking: Reported on 10/21/2024    [provider]    Melvenia Morrison, PGY1 Internal Medicine Residency Critical care  service

## 2024-10-22 NOTE — TOC Initial Note (Signed)
 Transition of Care [facility]) - Initial/Assessment Note    Patient Details  Name: Erica Whitney MRN: [id number] Date of Birth: 07/26/1941  Transition of Care [facility]) CM/SW Contact:    Lauraine FORBES Saa, LCSWA Phone Number: 10/22/2024, 3:52 PM  Clinical Narrative:                  3:52 PM CSW introduced self and role to patient's daughter, Darrick (per chart review, patient is not fully oriented). Darrick confirmed patient resides at home alone and that family provides patient transportation to doctor's appointments. Erica Whitney confirmed patient does not have SNF or HH history (Erica Whitney informed CSW that [facility] did not start services with patient and was unsure if patient used [facility] in the past). Erica Whitney informed CSW that patient has DME (rollator, front wheel walker, cane) history. Per chart review, patient has a PCP and insurance. Patient's preferred pharmacy is [facility] [address]. Erica Whitney expressed interest in home caregiver services. TOC will continue to follow.  Expected Discharge Plan: Home/Self Care Barriers to Discharge: Continued Medical Work up   Patient Goals and CMS Choice            Expected Discharge Plan and Services       Living arrangements for the past 2 months: Apartment                                      Prior Living Arrangements/Services Living arrangements for the past 2 months: Apartment Lives with:: Self Patient language and need for interpreter reviewed:: Yes        Need for Family Participation in Patient Care: Yes (Comment)     Criminal Activity/Legal Involvement Pertinent to Current Situation/Hospitalization: No - Comment as needed  Activities of Daily Living   ADL Screening (condition at time of admission) Independently performs ADLs?: No Does the patient have a NEW difficulty with bathing/dressing/toileting/self-feeding that is expected to last >3 days?: Yes (Initiates electronic notice to provider for possible OT  consult) Is the patient deaf or have difficulty hearing?: No Does the patient have difficulty seeing, even when wearing glasses/contacts?: No Does the patient have difficulty concentrating, remembering, or making decisions?: No  Permission Sought/Granted Permission sought to share information with : Family Supports Permission granted to share information with : No (Contact information on chart)  Share Information with NAME: Erica Whitney     Permission granted to share info w Relationship: Daughter  Permission granted to share info w Contact Information: [phone]  Emotional Assessment   Attitude/Demeanor/Rapport: Unable to Assess Affect (typically observed): Unable to Assess Orientation: : Oriented to Self, Oriented to Place Alcohol / Substance Use: Not Applicable Psych Involvement: No (comment)  Admission diagnosis:  Acute pulmonary edema (HCC) [J81.0] Renal failure [N19] Acute respiratory failure with hypoxia (HCC) [J96.01] AKI (acute kidney injury) [N17.9] Non-ST elevation MI (NSTEMI) (HCC) [I21.4] Hypertensive emergency [I16.1] H/O fluid overload [Z86.39] Patient Active Problem List   Diagnosis Date Noted   H/O fluid overload 10/21/2024   Acute respiratory failure with hypoxia (HCC) 10/21/2024   Hypertensive urgency 10/21/2024   Heart failure with reduced ejection fraction (HFrEF) (HCC) 10/21/2024   Elevated troponin 10/21/2024   Metabolic acidosis with increased anion gap and accumulation of organic acids 10/21/2024   Thrombocytopenia 10/21/2024   Depression 10/21/2024   Insomnia 10/21/2024   History of breast cancer 10/21/2024   Renal failure 10/21/2024   Anemia of chronic renal  failure, stage 5 (HCC) 08/24/2024   Cellulitis 01/26/2023   Acute metabolic encephalopathy 01/26/2023   Acute kidney injury superimposed on stage 4 chronic kidney disease (HCC) 01/26/2023   CKD (chronic kidney disease) stage 4, GFR 15-29 ml/min (HCC) 01/26/2023   Anemia in chronic  kidney disease (CKD) 04/18/2022   Duodenal ulcer with hemorrhage    Acute blood loss anemia 05/25/2021   Rectal bleeding 05/14/2021   Bright red blood per rectum 05/13/2021   HTN (hypertension) 05/13/2021   PUD (peptic ulcer disease) 05/13/2021   Controlled type 2 diabetes mellitus without complication, without long-term current use of insulin  (HCC) 05/13/2021   Osteopenia 01/13/2015   Uterine cancer (HCC)    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   [facility]P:  [facility] Pharmacy:   [facility] [address] [place] - [address] [place]) [place] [address] Phone: [phone] Fax: [phone]     Social Drivers of Health (SDOH) Social History: SDOH Screenings   Food Insecurity: No Food Insecurity (10/22/2024)  Housing: Low Risk  (10/22/2024)  Transportation Needs: No Transportation Needs (10/22/2024)  Utilities: Not At Risk (10/22/2024)  Social Connections: Socially Isolated (10/22/2024)  Tobacco Use: Medium Risk (10/21/2024)   SDOH Interventions: Social Connections Interventions: Patient Unable to Answer   Readmission Risk Interventions     No data to display

## 2024-10-22 NOTE — Progress Notes (Signed)
 Heart Failure Navigator Progress Note  Assessed for Heart & Vascular TOC clinic readiness.  Patient does not meet criteria due to ESRD started on hemodialysis. No HF TOC. .   Navigator will sign off at this time.   Stephane Haddock, BSN, Scientist, Clinical (histocompatibility And Immunogenetics) Only

## 2024-10-22 NOTE — Progress Notes (Addendum)
 Upon re-evaluation, patient remains somewhat confused. Able to state name and situation but disoriented to year and month. Intermittently falling asleep.   Will plan for second session of dialysis tomorrow with nephrology with hopeful improvement of her mental status to allow for her to make her own medical decisions regarding dialysis.

## 2024-10-22 NOTE — Progress Notes (Signed)
 Subjective:  Patient drowsy and sleepy remains on BiPAP started on hemodialysis as per family's wishes noted to have elevated high-sensitivity troponin earlier this morning.  Blood pressure labile  Objective:  Vital Signs in the last 24 hours: Temp:  [96.8 F (36 C)-99.5 F (37.5 C)] 97.9 F (36.6 C) (11/05 1045) Pulse Rate:  [72-111] 75 (11/05 1045) Resp:  [15-27] 15 (11/05 1045) BP: (113-237)/(79-149) 180/87 (11/05 1045) SpO2:  [91 %-100 %] 100 % (11/05 1045) FiO2 (%):  [40 %-50 %] 40 % (11/05 0802) Weight:  [50.9 kg-52.9 kg] 50.9 kg (11/05 1045)  Intake/Output from previous day: 11/04 0701 - 11/05 0700 In: 124.7 [I.V.:124.7] Out: 0  Intake/Output from this shift: Total I/O In: 39 [I.V.:39] Out: 2000 [Other:2000]  Physical Exam: Neck: no adenopathy, no carotid bruit, no JVD, and supple, symmetrical, trachea midline Lungs: Bilateral rhonchi and faint rales noted Heart: regular rate and rhythm, S1, S2 normal, and 2/6 systolic murmur noted Abdomen: soft, non-tender; bowel sounds normal; no masses,  no organomegaly Extremities: extremities normal, atraumatic, no cyanosis or edema  Lab Results: Recent Labs    10/21/24 0817 10/21/24 0823 10/21/24 1501 10/22/24 0602  WBC 6.6  --   --  9.3  HGB 9.4*   < > 9.2* 8.6*  PLT 120*  --   --  62*   < > = values in this interval not displayed.   Recent Labs    10/22/24 0248 10/22/24 0249  NA 146* 147*  K 4.2 4.2  CL 116* 117*  CO2 10* 11*  GLUCOSE 136* 136*  BUN 145* 145*  CREATININE 10.29* 10.34*   No results for input(s): TROPONINI in the last 72 hours.  Invalid input(s): CK, MB Hepatic Function Panel Recent Labs    10/21/24 0817 10/22/24 0249  PROT 6.6  --   ALBUMIN 3.3* 3.0*  AST 40  --   ALT 24  --   ALKPHOS 45  --   BILITOT 0.9  --    No results for input(s): CHOL in the last 72 hours. No results for input(s): PROTIME in the last 72 hours.  Imaging: Imaging results have been reviewed and DG  Chest Port 1 View Result Date: 10/22/2024 EXAM: 1 VIEW(S) XRAY OF THE CHEST 10/22/2024 05:33:45 AM COMPARISON: 10/21/2024 CLINICAL HISTORY: Hypoxia FINDINGS: LUNGS AND PLEURA: Increasing bilateral patchy lung opacities. Increasing interstitial changes. No pulmonary edema. No pleural effusion. No pneumothorax. HEART AND MEDIASTINUM: Overlapping cardiac leads. Cardiomegaly. BONES AND SOFT TISSUES: Surgical clips in right axillary region. Spine curvature and degenerative changes. IMPRESSION: 1. Interval progression of bilateral interstitial and patchy airspace opacities within both lungs. 2. Cardiomegaly. Electronically signed by: Waddell Calk MD 10/22/2024 05:39 AM EST RP Workstation: [id number]   ECHOCARDIOGRAM COMPLETE Result Date: 10/21/2024    ECHOCARDIOGRAM REPORT   Patient Name:   Erica Whitney Date of Exam: 10/21/2024 Medical Rec #:  [id number]          Height:       63.0 in Accession #:    [id number]         Weight:       114.0 lb Date of Birth:  04/07/1941         BSA:          1.523 m Patient Age:    83 years           BP:           123/96 mmHg Patient Gender: F  HR:           80 bpm. Exam Location:  Inpatient Procedure: 2D Echo (Both Spectral and Color Flow Doppler were utilized during            procedure). Indications:     Dyspnea  History:         Patient has no prior history of Echocardiogram examinations.                  Signs/Symptoms:Dyspnea.  Sonographer:     Norleen Amour Referring Phys:  [id number] RONDELL A SMITH Diagnosing Phys: Salena Negri MD IMPRESSIONS  1. Left ventricular ejection fraction, by estimation, is 45 to 50%. The left ventricle has mildly decreased function. The left ventricle demonstrates regional wall motion abnormalities (see scoring diagram/findings for description). The left ventricular  internal cavity size was mildly dilated. Left ventricular diastolic parameters are consistent with Grade I diastolic dysfunction (impaired relaxation). There is  moderate hypokinesis of the left ventricular, entire septal wall. The average left ventricular global longitudinal strain is -21.7 %. The global longitudinal strain is abnormal.  2. Right ventricular systolic function is normal. The right ventricular size is normal. There is moderately elevated pulmonary artery systolic pressure.  3. Left atrial size was severely dilated.  4. Right atrial size was mildly dilated.  5. There is no evidence of cardiac tamponade.  6. The mitral valve is degenerative. Severe mitral valve regurgitation.  7. Tricuspid valve regurgitation is moderate.  8. The aortic valve is tricuspid. There is mild calcification of the aortic valve. Aortic valve regurgitation is trivial. Aortic valve sclerosis is present, with no evidence of aortic valve stenosis.  9. The inferior vena cava is dilated in size with <50% respiratory variability, suggesting right atrial pressure of 15 mmHg. 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 FINDINGS  Left Ventricle: Left ventricular ejection fraction, by estimation, is 45 to 50%. The left ventricle has mildly decreased function. The left ventricle demonstrates regional wall motion abnormalities. Moderate hypokinesis of the left ventricular, entire septal wall. The average left ventricular global longitudinal strain is -21.7 %. Strain was performed and the global longitudinal strain is abnormal. The left ventricular internal cavity size was mildly dilated. There is borderline concentric left ventricular hypertrophy. Left ventricular diastolic parameters are consistent with Grade I diastolic dysfunction (impaired relaxation).  LV Wall Scoring: The entire septum is hypokinetic. The entire anterior wall, entire lateral wall, entire inferior wall, and apex are normal. Right Ventricle: The right ventricular size is normal. No increase in right ventricular wall thickness. Right ventricular systolic  function is normal. There is moderately elevated pulmonary artery systolic pressure. The tricuspid regurgitant velocity is 3.49 m/s, and with an assumed right atrial pressure of 8 mmHg, the estimated right ventricular systolic pressure is 56.7 mmHg. Left Atrium: Left atrial size was severely dilated. Right Atrium: Right atrial size was mildly dilated. Pericardium: Trivial pericardial effusion is present. The pericardial effusion is posterior to the left ventricle. There is no evidence of cardiac tamponade. Mitral Valve: The mitral valve is degenerative in appearance. Severe mitral valve regurgitation. Tricuspid Valve: The tricuspid valve is grossly normal. Tricuspid valve regurgitation is moderate. Aortic Valve: The aortic valve is tricuspid. There is mild calcification of the aortic valve. There is mild aortic valve annular calcification. Aortic valve regurgitation is trivial. Aortic valve sclerosis is present, with no evidence of aortic valve stenosis. Pulmonic Valve: The pulmonic valve was normal in structure. Pulmonic valve regurgitation is trivial. Aorta:  The aortic root is normal in size and structure. Venous: The inferior vena cava is dilated in size with less than 50% respiratory variability, suggesting right atrial pressure of 15 mmHg. IAS/Shunts: The atrial septum is grossly normal. Additional Comments: 3D was performed not requiring image post processing on an independent workstation and was indeterminate.  LEFT VENTRICLE PLAX 2D LVIDd:         5.70 cm      Diastology LVIDs:         4.20 cm      LV e' medial:    4.99 cm/s LV PW:         1.00 cm      LV E/e' medial:  27.9 LV IVS:        0.90 cm      LV e' lateral:   9.11 cm/s LVOT diam:     2.00 cm      LV E/e' lateral: 15.3 LV SV:         63 LV SV Index:   42           2D Longitudinal Strain LVOT Area:     3.14 cm     2D Strain GLS (A4C):   -19.9 % LV IVRT:       71 msec      2D Strain GLS (A3C):   -21.9 %                             2D Strain GLS (A2C):    -23.3 %                             2D Strain GLS Avg:     -21.7 % LV Volumes (MOD) LV vol d, MOD A2C: 114.0 ml LV vol d, MOD A4C: 128.0 ml LV vol s, MOD A2C: 55.9 ml LV vol s, MOD A4C: 62.2 ml LV SV MOD A2C:     58.1 ml LV SV MOD A4C:     128.0 ml LV SV MOD BP:      64.0 ml RIGHT VENTRICLE RV Basal diam:  4.30 cm     PULMONARY VEINS RV S prime:     13.40 cm/s  Diastolic Velocity: 71.00 cm/s TAPSE (M-mode): 2.3 cm      S/D Velocity:       0.60                             Systolic Velocity:  45.50 cm/s LEFT ATRIUM              Index        RIGHT ATRIUM           Index LA diam:        4.30 cm  2.82 cm/m   RA Area:     18.60 cm LA Vol (A2C):   91.3 ml  59.96 ml/m  RA Volume:   54.00 ml  35.46 ml/m LA Vol (A4C):   115.0 ml 75.52 ml/m LA Biplane Vol: 102.0 ml 66.99 ml/m  AORTIC VALVE             PULMONIC VALVE LVOT Vmax:   105.00 cm/s PV Vmax:       1.13 m/s LVOT Vmean:  67.200 cm/s PV Peak grad:  5.1 mmHg LVOT VTI:    0.202 m  AORTA Ao Root diam: 2.70 cm Ao Asc diam:  3.00 cm MITRAL VALVE                  TRICUSPID VALVE MV Area (PHT): 5.13 cm       TR Peak grad:   48.7 mmHg MV Decel Time: 148 msec       TR Vmax:        349.00 cm/s MR Peak grad:    189.9 mmHg MR Mean grad:    127.0 mmHg   SHUNTS MR Vmax:         689.00 cm/s  Systemic VTI:  0.20 m MR Vmean:        535.0 cm/s   Systemic Diam: 2.00 cm MR PISA:         1.57 cm MR PISA Eff ROA: 7 mm MR PISA Radius:  0.50 cm MV E velocity: 139.00 cm/s MV A velocity: 115.00 cm/s MV E/A ratio:  1.21 Salena Negri MD Electronically signed by Salena Negri MD Signature Date/Time: 10/21/2024/2:49:25 PM    Final    DG Chest Portable 1 View Result Date: 10/21/2024 EXAM: 1 VIEW(S) XRAY OF THE CHEST 10/21/2024 08:50:00 AM COMPARISON: 01/26/2023 CLINICAL HISTORY: SOB FINDINGS: LUNGS AND PLEURA: Diffuse interstitial and hazy airspace opacities throughout lungs. ncreased peripheral septal markings identified within the lower lung zones are compatible with interstitial edema.  No pleural effusion. No pneumothorax. HEART AND MEDIASTINUM: Mild cardiomegaly. Atherosclerotic calcifications of aortic arch. BONES AND SOFT TISSUES: Multilevel degenerative disc disease of spine. Scoliotic curvature. IMPRESSION: 1. Diffuse interstitial and hazy airspace opacities throughout the lungs, compatible with interstitial edema. Superimposed infection not excluded. 2. Mild cardiomegaly. 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

## 2024-10-22 NOTE — Progress Notes (Signed)
 eLink Physician-Brief Progress Note Patient Name: Erica Whitney DOB: Mar 07, 1941 MRN: [id number]   Date of Service  10/22/2024  HPI/Events of Note  Insomnia  eICU Interventions  Add Trazodone  PRN     Intervention Category Minor Interventions: Routine modifications to care plan (e.g. PRN medications for pain, fever)  Nnenna Meador 10/22/2024, 9:30 PM

## 2024-10-22 NOTE — Progress Notes (Signed)
 Erica Whitney is an 83 y.o. 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  Chest x-ray shows pulmonary vascular congestion, sublingual nitro as well as nitroglycerin drip with drop in blood pressure to 152/126 as well as improvement in oxygenation after starting BiPAP.  Patient has had history of orthopnea but denies any significant leg swelling.  Patient was just seen at [facility] on 10/06/2024 at which time Lasix  was increased to 40 mg twice daily for 4 days and then back to 40 mg daily.  Patient does not want dialysis after seeing her daughter and sister being on dialysis.  Patient already followed by palliative care.   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  As an outpt followed by Dr. Jerrye for >2 years she made it very clear that she didn't want dialysis after seeing her daughter and sister being on dialysis, already followed by palliative care as outpt. - diurese aggressively; very [place] w medications, lives by herself. Her daughter is doing the best she can. . - Appreciate palliative care seeing her in the hospital as well and also Dr. Jerrye speaking with the family at length on 11/4. The family (brother and sister) are adamant that they want dialysis to be initiated despite extensive conversation about the patient not wanting to ever be on dialysis. They expressed understanding that they are going against her wishes.   Appreciate CCM placing a right fem temporary catheter so promptly on 11/4  Seen on HD #1 3K bath low flows and shortened tx to decrease risk of DE Goal 2L UF  Planning on HD #2 tomorrow  When the patient is no longer uremic and able to make her decisions we can  address whether to continue at that time. Would hold off on TC + CLIP until she's of clear mind.   -Monitor Daily I/Os, Daily weight  -Maintain MAP>65 for optimal renal perfusion.  - Avoid nephrotoxic agents such as IV contrast, NSAIDs, and phosphate containing bowel preps (FLEETS)   **Hypertensive emergency secondary to noncompliance seen by cardiology - starting BiDil, restart home meds; avoid ACEi/ARB with advanced kidney disease    **Anemia:  receives ESA q2wks outpt and IV iron  PRN.  Hb 9x2 - will check iron  panels   **CHF with BNP 2214 - diurese aggressively as tolerated   **HTN: high in setting of missed meds -- restarted here as well as addition of BiDil   **DM: per primary  Subjective: Confused moved to ICU, +SOB, updated daughter who was bedside.   Chemistry and CBC: Creatinine  Date/Time Value Ref Range Status  02/15/2023 09:46 AM 3.86 (H) 0.44 - 1.00 mg/dL Final  94/81/7979 91:83 AM 1.34 (H) 0.44 - 1.00 mg/dL Final  88/70/7981 92:40 AM 1.1 0.6 - 1.1 mg/dL Final  94/70/7981 98:67 PM 1.1 0.6 - 1.1 mg/dL Final  88/72/7982 87:40 PM 1.0 0.6 - 1.1 mg/dL Final  92/92/7982 98:49 PM 1.0 0.6 - 1.1 mg/dL Final  97/79/7982 97:92 PM 0.9 0.6 - 1.1 mg/dL Final  88/78/7983 87:48 PM 1.1 0.6 - 1.1 mg/dL Final  91/80/7983 87:50 PM 1.0 0.6 - 1.1 mg/dL Final  94/72/7983 98:83 PM 1.0 0.6 - 1.1 mg/dL Final  97/76/7983 97:72 PM 0.8 0.6 - 1.1 mg/dL Final  01/07/2015 02:53 PM 0.8 0.6 - 1.1 mg/dL Final  88/74/7984 91:61 AM 1.0 0.6 - 1.1 mg/dL Final   Creat  Date/Time Value Ref Range Status  02/27/2023 09:18 AM 4.38 (H) 0.60 - 0.95 mg/dL Final  94/69/7976 90:59 AM 3.41 (H) 0.60 - 1.00 mg/dL Final  97/89/7977 90:49 AM 2.54 (H) 0.60 - 0.93 mg/dL Final    Comment:    For patients >80 years of age, the reference limit for Creatinine is approximately 13% higher for people identified as African-American. .    Creatinine, Ser  Date/Time Value Ref Range Status  10/22/2024 02:49 AM 10.34  (H) 0.44 - 1.00 mg/dL Final  88/94/7974 97:51 AM 10.29 (H) 0.44 - 1.00 mg/dL Final  88/95/7974 91:82 AM 10.15 (H) 0.44 - 1.00 mg/dL Final  97/87/7975 96:91 AM 4.22 (H) 0.44 - 1.00 mg/dL Final  97/88/7975 97:51 AM 4.28 (H) 0.44 - 1.00 mg/dL Final  97/89/7975 96:73 AM 4.44 (H) 0.44 - 1.00 mg/dL Final  97/90/7975 91:46 AM 4.60 (H) 0.44 - 1.00 mg/dL Final  97/90/7975 97:49 AM 4.91 (H) 0.44 - 1.00 mg/dL Final  97/83/7976 89:63 AM 3.05 (HH) 0.44 - 1.00 mg/dL Final    Comment:    REPEATED TO VERIFY CRITICAL RESULT CALLED TO, READ BACK BY AND VERIFIED WITH: [facility] MELVIN RN @ 1110 BY J SCOTTON ON 02/01/22    05/30/2021 04:16 AM 2.26 (H) 0.44 - 1.00 mg/dL Final  93/87/7977 96:82 AM 2.22 (H) 0.44 - 1.00 mg/dL Final  93/88/7977 95:92 AM 2.35 (H) 0.44 - 1.00 mg/dL Final  93/89/7977 87:42 AM 2.39 (H) 0.44 - 1.00 mg/dL Final  93/90/7977 98:98 AM 2.77 (H) 0.44 - 1.00 mg/dL Final  93/91/7977 98:43 PM 2.89 (H) 0.44 - 1.00 mg/dL Final  94/69/7977 98:73 AM 2.68 (H) 0.44 - 1.00 mg/dL Final  94/70/7977 93:49 AM 2.56 (H) 0.44 - 1.00 mg/dL Final  94/72/7977 90:96 AM 2.22 (H) 0.44 - 1.00 mg/dL Final  97/82/7977 89:70 AM 3.08 (HH) 0.44 - 1.00 mg/dL Final    Comment:    CRITICAL RESULT CALLED TO, READ BACK BY AND VERIFIED WITH: [facility] MOON AT 1129 BY HFLYNT 02/03/21   11/05/2019 10:28 AM 1.51 (H) 0.44 - 1.00 mg/dL Final  88/77/7980 89:90 AM 1.17 (H) 0.44 - 1.00 mg/dL Final  94/70/7980 87:52 PM 1.24 (H) 0.60 - 1.10 mg/dL Final  87/68/7984 93:55 AM 0.90 0.50 - 1.10 mg/dL Final  97/87/7986 89:69 AM 0.73 0.50 - 1.10 mg/dL Final  98/91/7986 87:85 PM 0.74 0.50 - 1.10 mg/dL Final  87/93/7988 90:45 PM 0.88 0.40 - 1.20 mg/dL Final  97/78/7988 90:75 PM 0.79 0.40 - 1.20 mg/dL Final  92/84/7989 90:54 PM 0.86 0.40 - 1.20 mg/dL Final  89/79/7990 91:51 PM 0.86 0.40 - 1.20 mg/dL Final  97/90/7990 91:42 PM 0.74 0.40 - 1.20 mg/dL Final   Recent Labs  Lab 10/21/24 0817 10/21/24 0823 10/21/24 1501 10/22/24 0248  10/22/24 0249  NA 145 146* 146* 146* 147*  K 4.1 4.1 4.1 4.2 4.2  CL 115*  --   --  116* 117*  CO2 13*  --   --  10* 11*  GLUCOSE 169*  --   --  136* 136*  BUN 137*  --   --  145* 145*  CREATININE 10.15*  --   --  10.29* 10.34*  CALCIUM  8.3*  --   --  8.1* 8.1*  PHOS  --   --   --  11.5* 11.5*   Recent Labs  Lab 10/21/24 0817 10/21/24 9176  10/21/24 1501 10/22/24 0602  WBC 6.6  --   --  9.3  NEUTROABS 5.4  --   --   --   HGB 9.4* 9.5* 9.2* 8.6*  HCT 30.0* 28.0* 27.0* 27.3*  MCV 98.7  --   --  98.9  PLT 120*  --   --  62*   Liver Function Tests: Recent Labs  Lab 10/21/24 0817 10/22/24 0249  AST 40  --   ALT 24  --   ALKPHOS 45  --   BILITOT 0.9  --   PROT 6.6  --   ALBUMIN 3.3* 3.0*   No results for input(s): LIPASE, AMYLASE in the last 168 hours. No results for input(s): AMMONIA in the last 168 hours. Cardiac Enzymes: No results for input(s): CKTOTAL, CKMB, CKMBINDEX, TROPONINI in the last 168 hours. Iron  Studies: No results for input(s): IRON , TIBC, TRANSFERRIN, FERRITIN in the last 72 hours. PT/INR: @LABRCNTIP (inr:5)  Xrays/Other Studies: ) Results for orders placed or performed during the hospital encounter of 10/21/24 (from the past 48 hours)  Resp panel by RT-PCR (RSV, Flu A&B, Covid) Anterior Nasal Swab     Status: None   Collection Time: 10/21/24  8:06 AM   Specimen: Anterior Nasal Swab  Result Value Ref Range   SARS Coronavirus 2 by RT PCR NEGATIVE NEGATIVE   Influenza A by PCR NEGATIVE NEGATIVE   Influenza B by PCR NEGATIVE NEGATIVE    Comment: (NOTE) The Xpert Xpress SARS-CoV-2/FLU/RSV plus assay is intended as an aid in the diagnosis of influenza from Nasopharyngeal swab specimens and should not be used as a sole basis for treatment. Nasal washings and aspirates are unacceptable for Xpert Xpress SARS-CoV-2/FLU/RSV testing.  Fact Sheet for Patients: bloggercourse.com  Fact Sheet for Healthcare  Providers: seriousbroker.it  This test is not yet approved or cleared by the [place]  FDA and has been authorized for detection and/or diagnosis of SARS-CoV-2 by FDA under an Emergency Use Authorization (EUA). This EUA will remain in effect (meaning this test can be used) for the duration of the COVID-19 declaration under Section 564(b)(1) of the Act, 21 U.S.C. section 360bbb-3(b)(1), unless the authorization is terminated or revoked.     Resp Syncytial Virus by PCR NEGATIVE NEGATIVE    Comment: (NOTE) Fact Sheet for Patients: bloggercourse.com  Fact Sheet for Healthcare Providers: seriousbroker.it  This test is not yet approved or cleared by the [place]  FDA and has been authorized for detection and/or diagnosis of SARS-CoV-2 by FDA under an Emergency Use Authorization (EUA). This EUA will remain in effect (meaning this test can be used) for the duration of the COVID-19 declaration under Section 564(b)(1) of the Act, 21 U.S.C. section 360bbb-3(b)(1), unless the authorization is terminated or revoked.  Performed at [facility], [address] [place], [place] [address]   Troponin I (High Sensitivity)     Status: Abnormal   Collection Time: 10/21/24  8:17 AM  Result Value Ref Range   Troponin I (High Sensitivity) 198 (HH) <18 ng/L    Comment: CRITICAL RESULT CALLED TO, READ BACK BY AND VERIFIED WITH THORNTON,S RN [phone] 10/21/24 AMIREHSANIF (NOTE) Elevated high sensitivity troponin I (hsTnI) values and significant  changes across serial measurements may suggest ACS but many other  chronic and acute conditions are known to elevate hsTnI results.  Refer to the Links section for chest pain algorithms and additional  guidance. Performed at [facility], [address] [place], [place] [address]   CBC with  Differential     Status: Abnormal   Collection Time: 10/21/24  8:17 AM   Result Value Ref Range   WBC 6.6 4.0 - 10.5 K/uL   RBC 3.04 (L) 3.87 - 5.11 MIL/uL   Hemoglobin 9.4 (L) 12.0 - 15.0 g/dL   HCT 69.9 (L) 63.9 - 53.9 %   MCV 98.7 80.0 - 100.0 fL   MCH 30.9 26.0 - 34.0 pg   MCHC 31.3 30.0 - 36.0 g/dL   RDW 81.4 (H) 88.4 - 84.4 %   Platelets 120 (L) 150 - 400 K/uL   nRBC 0.0 0.0 - 0.2 %   Neutrophils Relative % 82 %   Neutro Abs 5.4 1.7 - 7.7 K/uL   Lymphocytes Relative 6 %   Lymphs Abs 0.4 (L) 0.7 - 4.0 K/uL   Monocytes Relative 11 %   Monocytes Absolute 0.7 0.1 - 1.0 K/uL   Eosinophils Relative 0 %   Eosinophils Absolute 0.0 0.0 - 0.5 K/uL   Basophils Relative 0 %   Basophils Absolute 0.0 0.0 - 0.1 K/uL   Immature Granulocytes 1 %   Abs Immature Granulocytes 0.05 0.00 - 0.07 K/uL    Comment: Performed at [facility], [address], [place], [place] [address]  Comprehensive metabolic panel     Status: Abnormal   Collection Time: 10/21/24  8:17 AM  Result Value Ref Range   Sodium 145 135 - 145 mmol/L   Potassium 4.1 3.5 - 5.1 mmol/L   Chloride 115 (H) 98 - 111 mmol/L   CO2 13 (L) 22 - 32 mmol/L   Glucose, Bld 169 (H) 70 - 99 mg/dL    Comment: Glucose reference range applies only to samples taken after fasting for at least 8 hours.   BUN 137 (H) 8 - 23 mg/dL   Creatinine, Ser 89.84 (H) 0.44 - 1.00 mg/dL   Calcium  8.3 (L) 8.9 - 10.3 mg/dL   Total Protein 6.6 6.5 - 8.1 g/dL   Albumin 3.3 (L) 3.5 - 5.0 g/dL   AST 40 15 - 41 U/L   ALT 24 0 - 44 U/L   Alkaline Phosphatase 45 38 - 126 U/L   Total Bilirubin 0.9 0.0 - 1.2 mg/dL   GFR, Estimated 4 (L) >60 mL/min    Comment: (NOTE) Calculated using the CKD-EPI Creatinine Equation (2021)    Anion gap 17 (H) 5 - 15    Comment: Performed at [facility], [address] [place], [place] [address]  Brain natriuretic peptide     Status: Abnormal   Collection Time: 10/21/24  8:17 AM  Result Value Ref Range   B Natriuretic Peptide 2,214.4 (H) 0.0 - 100.0 pg/mL    Comment: Performed at  [facility], [address], [place], [place] [address]  I-Stat venous blood gas, [facility] ED, [facility], [facility])     Status: Abnormal   Collection Time: 10/21/24  8:23 AM  Result Value Ref Range   pH, Ven 7.278 7.25 - 7.43   pCO2, Ven 26.3 (L) 44 - 60 mmHg   pO2, Ven 115 (H) 32 - 45 mmHg   Bicarbonate 12.3 (L) 20.0 - 28.0 mmol/L   TCO2 13 (L) 22 - 32 mmol/L   O2 Saturation 98 %   Acid-base deficit 13.0 (H) 0.0 - 2.0 mmol/L   Sodium 146 (H) 135 - 145 mmol/L   Potassium 4.1 3.5 - 5.1 mmol/L   Calcium , Ion 1.09 (L) 1.15 - 1.40 mmol/L  HCT 28.0 (L) 36.0 - 46.0 %   Hemoglobin 9.5 (L) 12.0 - 15.0 g/dL   Sample type VENOUS   Troponin I (High Sensitivity)     Status: Abnormal   Collection Time: 10/21/24 11:38 AM  Result Value Ref Range   Troponin I (High Sensitivity) 408 (HH) <18 ng/L    Comment: CRITICAL VALUE NOTED. VALUE IS CONSISTENT WITH PREVIOUSLY REPORTED/CALLED VALUE (NOTE) Elevated high sensitivity troponin I (hsTnI) values and significant  changes across serial measurements may suggest ACS but many other  chronic and acute conditions are known to elevate hsTnI results.  Refer to the Links section for chest pain algorithms and additional  guidance. Performed at [facility], [address], [place], [place] [address]   CBG monitoring, ED     Status: Abnormal   Collection Time: 10/21/24  2:20 PM  Result Value Ref Range   Glucose-Capillary 126 (H) 70 - 99 mg/dL    Comment: Glucose reference range applies only to samples taken after fasting for at least 8 hours.   Comment 1 Document in Chart   I-Stat arterial blood gas, ED     Status: Abnormal   Collection Time: 10/21/24  3:01 PM  Result Value Ref Range   pH, Arterial 7.231 (L) 7.35 - 7.45   pCO2 arterial 31.1 (L) 32 - 48 mmHg   pO2, Arterial 204 (H) 83 - 108 mmHg   Bicarbonate 13.0 (L) 20.0 - 28.0 mmol/L   TCO2 14 (L) 22 - 32 mmol/L   O2 Saturation 100 %   Acid-base deficit 13.0 (H) 0.0 - 2.0 mmol/L   Sodium 146 (H)  135 - 145 mmol/L   Potassium 4.1 3.5 - 5.1 mmol/L   Calcium , Ion 1.18 1.15 - 1.40 mmol/L   HCT 27.0 (L) 36.0 - 46.0 %   Hemoglobin 9.2 (L) 12.0 - 15.0 g/dL   Patient temperature 01.0 F    Collection site RADIAL, ALLEN'S TEST ACCEPTABLE    Drawn by RT    Sample type ARTERIAL   MRSA Next Gen by PCR, Nasal     Status: None   Collection Time: 10/21/24  7:47 PM   Specimen: Nasal Mucosa; Nasal Swab  Result Value Ref Range   MRSA by PCR Next Gen NOT DETECTED NOT DETECTED    Comment: (NOTE) The GeneXpert MRSA Assay (FDA approved for NASAL specimens only), is one component of a comprehensive MRSA colonization surveillance program. It is not intended to diagnose MRSA infection nor to guide or monitor treatment for MRSA infections. Test performance is not FDA approved in patients less than 20 years old. Performed at [facility], [address] [place], [place] [address]   Glucose, capillary     Status: Abnormal   Collection Time: 10/21/24  8:40 PM  Result Value Ref Range   Glucose-Capillary 130 (H) 70 - 99 mg/dL    Comment: Glucose reference range applies only to samples taken after fasting for at least 8 hours.  Hepatitis B surface antigen     Status: None   Collection Time: 10/21/24  8:59 PM  Result Value Ref Range   Hepatitis B Surface Ag NON REACTIVE NON REACTIVE    Comment: Performed at [facility], [address] [place], [place] [address]  Type and screen If need to transfuse blood products please use the blood administration order set     Status: None   Collection Time: 10/21/24  8:59 PM  Result Value Ref Range  ABO/RH(D) O POS    Antibody Screen NEG    Sample Expiration      10/24/2024,2359 Performed at [facility], [address] [place], [place] [address]   Glucose, capillary     Status: Abnormal   Collection Time: 10/21/24 11:43 PM  Result Value Ref Range   Glucose-Capillary 122 (H) 70 - 99 mg/dL    Comment: Glucose reference range applies only  to samples taken after fasting for at least 8 hours.  Basic metabolic panel     Status: Abnormal   Collection Time: 10/22/24  2:48 AM  Result Value Ref Range   Sodium 146 (H) 135 - 145 mmol/L   Potassium 4.2 3.5 - 5.1 mmol/L   Chloride 116 (H) 98 - 111 mmol/L   CO2 10 (L) 22 - 32 mmol/L   Glucose, Bld 136 (H) 70 - 99 mg/dL    Comment: Glucose reference range applies only to samples taken after fasting for at least 8 hours.   BUN 145 (H) 8 - 23 mg/dL   Creatinine, Ser 89.70 (H) 0.44 - 1.00 mg/dL   Calcium  8.1 (L) 8.9 - 10.3 mg/dL   GFR, Estimated 3 (L) >60 mL/min    Comment: (NOTE) Calculated using the CKD-EPI Creatinine Equation (2021)    Anion gap 20 (H) 5 - 15    Comment: Performed at [facility], [address], [place], [place] [address]  Magnesium      Status: None   Collection Time: 10/22/24  2:48 AM  Result Value Ref Range   Magnesium  1.8 1.7 - 2.4 mg/dL    Comment: Performed at [facility], [address] [place], [place] [address]  Phosphorus     Status: Abnormal   Collection Time: 10/22/24  2:48 AM  Result Value Ref Range   Phosphorus 11.5 (H) 2.5 - 4.6 mg/dL    Comment: Performed at [facility], [address], [place], [place] [address]  Renal function panel     Status: Abnormal   Collection Time: 10/22/24  2:49 AM  Result Value Ref Range   Sodium 147 (H) 135 - 145 mmol/L   Potassium 4.2 3.5 - 5.1 mmol/L   Chloride 117 (H) 98 - 111 mmol/L   CO2 11 (L) 22 - 32 mmol/L   Glucose, Bld 136 (H) 70 - 99 mg/dL    Comment: Glucose reference range applies only to samples taken after fasting for at least 8 hours.   BUN 145 (H) 8 - 23 mg/dL   Creatinine, Ser 89.65 (H) 0.44 - 1.00 mg/dL   Calcium  8.1 (L) 8.9 - 10.3 mg/dL   Phosphorus 88.4 (H) 2.5 - 4.6 mg/dL   Albumin 3.0 (L) 3.5 - 5.0 g/dL   GFR, Estimated 3 (L) >60 mL/min    Comment: (NOTE) Calculated using the CKD-EPI Creatinine Equation (2021)    Anion gap 19 (H) 5 - 15    Comment: Performed at  [facility], [address], [place], [place] [address]  Glucose, capillary     Status: Abnormal   Collection Time: 10/22/24  3:53 AM  Result Value Ref Range   Glucose-Capillary 138 (H) 70 - 99 mg/dL    Comment: Glucose reference range applies only to samples taken after fasting for at least 8 hours.  CBC     Status: Abnormal   Collection Time: 10/22/24  6:02 AM  Result Value Ref Range   WBC 9.3 4.0 - 10.5 K/uL  RBC 2.76 (L) 3.87 - 5.11 MIL/uL   Hemoglobin 8.6 (L) 12.0 - 15.0 g/dL   HCT 72.6 (L) 63.9 - 53.9 %   MCV 98.9 80.0 - 100.0 fL   MCH 31.2 26.0 - 34.0 pg   MCHC 31.5 30.0 - 36.0 g/dL   RDW 81.5 (H) 88.4 - 84.4 %   Platelets 62 (L) 150 - 400 K/uL    Comment: SPECIMEN CHECKED FOR CLOTS DELTA CHECK NOTED PLATELET COUNT CONFIRMED BY SMEAR Immature Platelet Fraction may be clinically indicated, consider ordering this additional test [id number]    nRBC 0.0 0.0 - 0.2 %    Comment: Performed at [facility], [address] [place], [place] [address]  Troponin I (High Sensitivity)     Status: Abnormal   Collection Time: 10/22/24  6:02 AM  Result Value Ref Range   Troponin I (High Sensitivity) 1,260 (HH) <18 ng/L    Comment: CRITICAL VALUE NOTED. VALUE IS CONSISTENT WITH PREVIOUSLY REPORTED/CALLED VALUE (NOTE) Elevated high sensitivity troponin I (hsTnI) values and significant  changes across serial measurements may suggest ACS but many other  chronic and acute conditions are known to elevate hsTnI results.  Refer to the Links section for chest pain algorithms and additional  guidance. Performed at [facility], [address], [place], [place] [address]   Glucose, capillary     Status: Abnormal   Collection Time: 10/22/24  8:07 AM  Result Value Ref Range   Glucose-Capillary 137 (H) 70 - 99 mg/dL    Comment: Glucose reference range applies only to samples taken after fasting for at least 8 hours.   DG Chest Port 1 View Result Date: 10/22/2024 EXAM: 1  VIEW(S) XRAY OF THE CHEST 10/22/2024 05:33:45 AM COMPARISON: 10/21/2024 CLINICAL HISTORY: Hypoxia FINDINGS: LUNGS AND PLEURA: Increasing bilateral patchy lung opacities. Increasing interstitial changes. No pulmonary edema. No pleural effusion. No pneumothorax. HEART AND MEDIASTINUM: Overlapping cardiac leads. Cardiomegaly. BONES AND SOFT TISSUES: Surgical clips in right axillary region. Spine curvature and degenerative changes. IMPRESSION: 1. Interval progression of bilateral interstitial and patchy airspace opacities within both lungs. 2. Cardiomegaly. Electronically signed by: Waddell Calk MD 10/22/2024 05:39 AM EST RP Workstation: [id number]   ECHOCARDIOGRAM COMPLETE Result Date: 10/21/2024    ECHOCARDIOGRAM REPORT   Patient Name:   Erica Whitney Date of Exam: 10/21/2024 Medical Rec #:  [id number]          Height:       63.0 in Accession #:    [id number]         Weight:       114.0 lb Date of Birth:  24-Mar-1941         BSA:          1.523 m Patient Age:    82 years           BP:           123/96 mmHg Patient Gender: F                  HR:           80 bpm. Exam Location:  Inpatient Procedure: 2D Echo (Both Spectral and Color Flow Doppler were utilized during            procedure). Indications:     Dyspnea  History:         Patient has no prior history of Echocardiogram examinations.  Signs/Symptoms:Dyspnea.  Sonographer:     Norleen Amour Referring Phys:  [id number] RONDELL A SMITH Diagnosing Phys: Salena Negri MD IMPRESSIONS  1. Left ventricular ejection fraction, by estimation, is 45 to 50%. The left ventricle has mildly decreased function. The left ventricle demonstrates regional wall motion abnormalities (see scoring diagram/findings for description). The left ventricular  internal cavity size was mildly dilated. Left ventricular diastolic parameters are consistent with Grade I diastolic dysfunction (impaired relaxation). There is moderate hypokinesis of the left ventricular, entire  septal wall. The average left ventricular global longitudinal strain is -21.7 %. The global longitudinal strain is abnormal.  2. Right ventricular systolic function is normal. The right ventricular size is normal. There is moderately elevated pulmonary artery systolic pressure.  3. Left atrial size was severely dilated.  4. Right atrial size was mildly dilated.  5. There is no evidence of cardiac tamponade.  6. The mitral valve is degenerative. Severe mitral valve regurgitation.  7. Tricuspid valve regurgitation is moderate.  8. The aortic valve is tricuspid. There is mild calcification of the aortic valve. Aortic valve regurgitation is trivial. Aortic valve sclerosis is present, with no evidence of aortic valve stenosis.  9. The inferior vena cava is dilated in size with <50% respiratory variability, suggesting right atrial pressure of 15 mmHg. 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 FINDINGS  Left Ventricle: Left ventricular ejection fraction, by estimation, is 45 to 50%. The left ventricle has mildly decreased function. The left ventricle demonstrates regional wall motion abnormalities. Moderate hypokinesis of the left ventricular, entire septal wall. The average left ventricular global longitudinal strain is -21.7 %. Strain was performed and the global longitudinal strain is abnormal. The left ventricular internal cavity size was mildly dilated. There is borderline concentric left ventricular hypertrophy. Left ventricular diastolic parameters are consistent with Grade I diastolic dysfunction (impaired relaxation).  LV Wall Scoring: The entire septum is hypokinetic. The entire anterior wall, entire lateral wall, entire inferior wall, and apex are normal. Right Ventricle: The right ventricular size is normal. No increase in right ventricular wall thickness. Right ventricular systolic function is normal. There is moderately elevated  pulmonary artery systolic pressure. The tricuspid regurgitant velocity is 3.49 m/s, and with an assumed right atrial pressure of 8 mmHg, the estimated right ventricular systolic pressure is 56.7 mmHg. Left Atrium: Left atrial size was severely dilated. Right Atrium: Right atrial size was mildly dilated. Pericardium: Trivial pericardial effusion is present. The pericardial effusion is posterior to the left ventricle. There is no evidence of cardiac tamponade. Mitral Valve: The mitral valve is degenerative in appearance. Severe mitral valve regurgitation. Tricuspid Valve: The tricuspid valve is grossly normal. Tricuspid valve regurgitation is moderate. Aortic Valve: The aortic valve is tricuspid. There is mild calcification of the aortic valve. There is mild aortic valve annular calcification. Aortic valve regurgitation is trivial. Aortic valve sclerosis is present, with no evidence of aortic valve stenosis. Pulmonic Valve: The pulmonic valve was normal in structure. Pulmonic valve regurgitation is trivial. Aorta: The aortic root is normal in size and structure. Venous: The inferior vena cava is dilated in size with less than 50% respiratory variability, suggesting right atrial pressure of 15 mmHg. IAS/Shunts: The atrial septum is grossly normal. Additional Comments: 3D was performed not requiring image post processing on an independent workstation and was indeterminate.  LEFT VENTRICLE PLAX 2D LVIDd:         5.70 cm      Diastology LVIDs:  4.20 cm      LV e' medial:    4.99 cm/s LV PW:         1.00 cm      LV E/e' medial:  27.9 LV IVS:        0.90 cm      LV e' lateral:   9.11 cm/s LVOT diam:     2.00 cm      LV E/e' lateral: 15.3 LV SV:         63 LV SV Index:   42           2D Longitudinal Strain LVOT Area:     3.14 cm     2D Strain GLS (A4C):   -19.9 % LV IVRT:       71 msec      2D Strain GLS (A3C):   -21.9 %                             2D Strain GLS (A2C):   -23.3 %                             2D Strain  GLS Avg:     -21.7 % LV Volumes (MOD) LV vol d, MOD A2C: 114.0 ml LV vol d, MOD A4C: 128.0 ml LV vol s, MOD A2C: 55.9 ml LV vol s, MOD A4C: 62.2 ml LV SV MOD A2C:     58.1 ml LV SV MOD A4C:     128.0 ml LV SV MOD BP:      64.0 ml RIGHT VENTRICLE RV Basal diam:  4.30 cm     PULMONARY VEINS RV S prime:     13.40 cm/s  Diastolic Velocity: 71.00 cm/s TAPSE (M-mode): 2.3 cm      S/D Velocity:       0.60                             Systolic Velocity:  45.50 cm/s LEFT ATRIUM              Index        RIGHT ATRIUM           Index LA diam:        4.30 cm  2.82 cm/m   RA Area:     18.60 cm LA Vol (A2C):   91.3 ml  59.96 ml/m  RA Volume:   54.00 ml  35.46 ml/m LA Vol (A4C):   115.0 ml 75.52 ml/m LA Biplane Vol: 102.0 ml 66.99 ml/m  AORTIC VALVE             PULMONIC VALVE LVOT Vmax:   105.00 cm/s PV Vmax:       1.13 m/s LVOT Vmean:  67.200 cm/s PV Peak grad:  5.1 mmHg LVOT VTI:    0.202 m  AORTA Ao Root diam: 2.70 cm Ao Asc diam:  3.00 cm MITRAL VALVE                  TRICUSPID VALVE MV Area (PHT): 5.13 cm       TR Peak grad:   48.7 mmHg MV Decel Time: 148 msec       TR Vmax:        349.00 cm/s MR Peak grad:    189.9 mmHg MR Mean grad:  127.0 mmHg   SHUNTS MR Vmax:         689.00 cm/s  Systemic VTI:  0.20 m MR Vmean:        535.0 cm/s   Systemic Diam: 2.00 cm MR PISA:         1.57 cm MR PISA Eff ROA: 7 mm MR PISA Radius:  0.50 cm MV E velocity: 139.00 cm/s MV A velocity: 115.00 cm/s MV E/A ratio:  1.21 Salena Negri MD Electronically signed by Salena Negri MD Signature Date/Time: 10/21/2024/2:49:25 PM    Final    DG Chest Portable 1 View Result Date: 10/21/2024 EXAM: 1 VIEW(S) XRAY OF THE CHEST 10/21/2024 08:50:00 AM COMPARISON: 01/26/2023 CLINICAL HISTORY: SOB FINDINGS: LUNGS AND PLEURA: Diffuse interstitial and hazy airspace opacities throughout lungs. ncreased peripheral septal markings identified within the lower lung zones are compatible with interstitial edema. No pleural effusion. No pneumothorax. HEART AND  MEDIASTINUM: Mild cardiomegaly. Atherosclerotic calcifications of aortic arch. BONES AND SOFT TISSUES: Multilevel degenerative disc disease of spine. Scoliotic curvature. IMPRESSION: 1. Diffuse interstitial and hazy airspace opacities throughout the lungs, compatible with interstitial edema. Superimposed infection not excluded. 2. Mild cardiomegaly. Electronically signed by: Waddell Calk MD 10/21/2024 08:55 AM EST RP Workstation: [id number]    PMH:   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   Chronic kidney disease (CKD), stage IV (severe) (HCC)    Depression    Diabetes mellitus    Hypercholesteremia    Hypertension    Insomnia    Peptic ulcer    Uterine cancer (HCC)    dx in her 23s   Wears glasses     PSH:   Past Surgical History:  Procedure Laterality Date   ABDOMINAL HYSTERECTOMY  1995   BACK SURGERY  2000   lumb lam   BIOPSY  05/14/2021   Procedure: BIOPSY;  Surgeon: Legrand Victory LITTIE DOUGLAS, MD;  Location: [facility] ENDOSCOPY;  Service: Gastroenterology;;   BREAST LUMPECTOMY Right 2015   COLONOSCOPY     ESOPHAGOGASTRODUODENOSCOPY N/A 05/14/2021   Procedure: ESOPHAGOGASTRODUODENOSCOPY (EGD);  Surgeon: Legrand Victory LITTIE DOUGLAS, MD;  Location: [facility] ENDOSCOPY;  Service: Gastroenterology;  Laterality: N/A;   ESOPHAGOGASTRODUODENOSCOPY (EGD) WITH PROPOFOL  N/A 05/26/2021   Procedure: ESOPHAGOGASTRODUODENOSCOPY (EGD) WITH PROPOFOL ;  Surgeon: Rollin Dover, MD;  Location: [facility] ENDOSCOPY;  Service: Endoscopy;  Laterality: N/A;   HEMOSTASIS CLIP PLACEMENT  05/26/2021   Procedure: HEMOSTASIS CLIP PLACEMENT;  Surgeon: Rollin Dover, MD;  Location: [facility] ENDOSCOPY;  Service: Endoscopy;;   HEMOSTASIS CONTROL  05/14/2021   Procedure: HEMOSTASIS CONTROL;  Surgeon: Legrand Victory LITTIE DOUGLAS, MD;  Location: [facility] ENDOSCOPY;  Service: Gastroenterology;;   HOT HEMOSTASIS N/A  05/14/2021   Procedure: HOT HEMOSTASIS (ARGON PLASMA COAGULATION/BICAP);  Surgeon: Legrand Victory LITTIE DOUGLAS, MD;  Location: [facility] ENDOSCOPY;  Service: Gastroenterology;  Laterality: N/A;   HOT HEMOSTASIS N/A 05/26/2021   Procedure: HOT HEMOSTASIS (ARGON PLASMA COAGULATION/BICAP);  Surgeon: Rollin Dover, MD;  Location: [facility] ENDOSCOPY;  Service: Endoscopy;  Laterality: N/A;   ORIF METACARPAL FRACTURE  2012   left   RADIOACTIVE SEED GUIDED PARTIAL MASTECTOMY WITH AXILLARY SENTINEL LYMPH NODE BIOPSY Right 12/17/2014   Procedure: RIGHT BREAST RADIOACTIVE SEED LOCALIZED LUMPECTOMY AND SENTINEL NODE MAPPING;  Surgeon: Deward Null III, MD;  Location:  SURGERY CENTER;  Service: General;  Laterality: Right;   SCLEROTHERAPY  05/26/2021   Procedure: MATIAS;  Surgeon: Rollin Dover, MD;  Location: [facility] ENDOSCOPY;  Service: Endoscopy;;    Allergies:  Allergies  Allergen Reactions   Tape Itching and Rash    Medications:   Prior to Admission medications   Medication Sig Start Date End Date Taking? Authorizing Provider  acetaminophen  (TYLENOL ) 650 MG CR tablet Take 1,300 mg by mouth every 8 (eight) hours as needed for pain.   Yes [provider]  amLODipine  (NORVASC ) 10 MG tablet Take 1 tablet (10 mg total) by mouth daily. 05/17/21  Yes Gonfa, Taye T, MD  atorvastatin  (LIPITOR) 40 MG tablet Take 40 mg by mouth daily.   Yes [provider]  calcitRIOL (ROCALTROL) 0.25 MCG capsule Take 0.25 mcg by mouth daily. 12/22/22  Yes [provider]  Camphor-Menthol-Methyl Sal (SALONPAS EX) Apply 1 patch topically as needed (pain).   Yes [provider]  cycloSPORINE (RESTASIS) 0.05 % ophthalmic emulsion Place 1 drop into both eyes daily.   Yes [provider]  ferrous sulfate  325 (65 FE) MG tablet Take 1 tablet (325 mg total) by mouth daily with breakfast. 05/31/21  Yes Amin, Ankit C, MD  furosemide  (LASIX ) 40 MG tablet Take 1 tablet (40 mg total) by mouth daily as needed for  fluid (ankle swelling). Patient taking differently: Take 40 mg by mouth 2 (two) times daily. 05/23/21  Yes Gonfa, Taye T, MD  hydrALAZINE  (APRESOLINE ) 100 MG tablet Take 100 mg by mouth 2 (two) times daily.   Yes [provider]  metoprolol  succinate (TOPROL -XL) 25 MG 24 hr tablet Take 25 mg by mouth daily. 12/22/22  Yes [provider]  pantoprazole  (PROTONIX ) 40 MG tablet Take 40 mg by mouth daily.   Yes [provider]  senna-docusate (SENOKOT-S) 8.6-50 MG tablet Take 1 tablet by mouth at bedtime as needed for mild constipation or moderate constipation. 05/30/21  Yes Amin, Ankit C, MD  traZODone  (DESYREL ) 50 MG tablet Take 50 mg by mouth at bedtime. 01/19/23  Yes [provider]  mirtazapine  (REMERON ) 30 MG tablet Take 30 mg by mouth at bedtime. Patient not taking: Reported on 10/21/2024 04/07/21   [provider]  sertraline  (ZOLOFT ) 100 MG tablet Take 100 mg by mouth every morning. Patient not taking: Reported on 10/21/2024    [provider]    Discontinued Meds:   Medications Discontinued During This Encounter  Medication Reason   pantoprazole  (PROTONIX ) 40 MG tablet Duplicate   gabapentin  (NEURONTIN ) 100 MG capsule Patient Preference   saccharomyces boulardii (FLORASTOR) 250 MG capsule Patient Preference   nitroGLYCERIN 50 mg in dextrose  5 % 250 mL (0.2 mg/mL) infusion    sertraline  (ZOLOFT ) tablet 100 mg    heparin injection 5,000 Units     Social History:  reports that she quit smoking about 12 years ago. Her smoking use included cigarettes. She has quit using smokeless tobacco.  Her smokeless tobacco use included snuff. She reports that she does not drink alcohol and does not use drugs.  Family History:   Family History  Problem Relation Age of Onset   Cancer Mother    Stomach cancer Mother 40   Heart attack Father    Cancer Brother    Lung cancer Brother 64   Breast cancer Sister 18    Blood pressure (!) 136/116, pulse 92,  temperature 99.5 F (37.5 C), temperature source Axillary, resp. rate 17, weight 52.9 kg, SpO2 100%. Physical Exam: Gen: elderly woman, arousable on bipap Eyes: anicteric  ENT: MMM,  poor dentition Neck: no swelling  CV: RRR no rub Abd: soft Lungs: rales Extr: tr-1+ edema Neuro: arousable, moving all 4 ext        Erica LYNWOOD ORN, MD 10/22/2024, 8:20 AM

## 2024-10-22 NOTE — Plan of Care (Signed)

## 2024-10-22 NOTE — Plan of Care (Signed)
  Problem: Education: Goal: Ability to demonstrate management of disease process will improve Outcome: Not Progressing Goal: Ability to verbalize understanding of medication therapies will improve Outcome: Not Progressing Goal: Individualized Educational Video(s) Outcome: Not Applicable   Problem: Activity: Goal: Capacity to carry out activities will improve Outcome: Progressing   Problem: Cardiac: Goal: Ability to achieve and maintain adequate cardiopulmonary perfusion will improve Outcome: Progressing   Problem: Education: Goal: Knowledge of General Education information will improve Description: Including pain rating scale, medication(s)/side effects and non-pharmacologic comfort measures Outcome: Not Progressing   Problem: Clinical Measurements: Goal: Ability to maintain clinical measurements within normal limits will improve Outcome: Progressing Goal: Will remain free from infection Outcome: Progressing Goal: Diagnostic test results will improve Outcome: Progressing Goal: Respiratory complications will improve Outcome: Progressing Goal: Cardiovascular complication will be avoided Outcome: Progressing   Problem: Health Behavior/Discharge Planning: Goal: Ability to manage health-related needs will improve Outcome: Not Progressing   Problem: Activity: Goal: Risk for activity intolerance will decrease Outcome: Progressing   Problem: Nutrition: Goal: Adequate nutrition will be maintained Outcome: Progressing   Problem: Coping: Goal: Level of anxiety will decrease Outcome: Progressing   Problem: Elimination: Goal: Will not experience complications related to bowel motility Outcome: Progressing Goal: Will not experience complications related to urinary retention Outcome: Progressing   Problem: Pain Managment: Goal: General experience of comfort will improve and/or be controlled Outcome: Progressing   Problem: Safety: Goal: Ability to remain free from injury  will improve Outcome: Progressing   Problem: Skin Integrity: Goal: Risk for impaired skin integrity will decrease Outcome: Progressing

## 2024-10-23 LAB — CBC
HCT: 22.9 % — ABNORMAL LOW (ref 36.0–46.0)
Hemoglobin: 7.5 g/dL — ABNORMAL LOW (ref 12.0–15.0)
MCH: 31.3 pg (ref 26.0–34.0)
MCHC: 32.8 g/dL (ref 30.0–36.0)
MCV: 95.4 fL (ref 80.0–100.0)
Platelets: 61 K/uL — ABNORMAL LOW (ref 150–400)
RBC: 2.4 MIL/uL — ABNORMAL LOW (ref 3.87–5.11)
RDW: 18.1 % — ABNORMAL HIGH (ref 11.5–15.5)
WBC: 7.9 K/uL (ref 4.0–10.5)
nRBC: 0 % (ref 0.0–0.2)

## 2024-10-23 LAB — GLUCOSE, CAPILLARY
Glucose-Capillary: 82 mg/dL (ref 70–99)
Glucose-Capillary: 86 mg/dL (ref 70–99)
Glucose-Capillary: 129 mg/dL — ABNORMAL HIGH (ref 70–99)
Glucose-Capillary: 73 mg/dL (ref 70–99)
Glucose-Capillary: 102 mg/dL — ABNORMAL HIGH (ref 70–99)
Glucose-Capillary: 86 mg/dL (ref 70–99)

## 2024-10-23 LAB — RENAL FUNCTION PANEL
Albumin: 2.5 g/dL — ABNORMAL LOW (ref 3.5–5.0)
Anion gap: 17 — ABNORMAL HIGH (ref 5–15)
BUN: 108 mg/dL — ABNORMAL HIGH (ref 8–23)
CO2: 17 mmol/L — ABNORMAL LOW (ref 22–32)
Calcium: 7.8 mg/dL — ABNORMAL LOW (ref 8.9–10.3)
Chloride: 108 mmol/L (ref 98–111)
Creatinine, Ser: 8.04 mg/dL — ABNORMAL HIGH (ref 0.44–1.00)
GFR, Estimated: 5 mL/min — ABNORMAL LOW (ref >60)
Glucose, Bld: 83 mg/dL (ref 70–99)
Phosphorus: 8 mg/dL — ABNORMAL HIGH (ref 2.5–4.6)
Potassium: 3.7 mmol/L (ref 3.5–5.1)
Sodium: 142 mmol/L (ref 135–145)

## 2024-10-23 LAB — HEPATITIS B SURFACE ANTIBODY, QUANTITATIVE: Hep B S AB Quant (Post): <3.5 m[IU]/mL — ABNORMAL LOW

## 2024-10-23 LAB — MAGNESIUM: Magnesium: 1.7 mg/dL (ref 1.7–2.4)

## 2024-10-23 LAB — HEPARIN INDUCED PLATELET AB (HIT ANTIBODY): Heparin Induced Plt Ab: 0.145 {OD_unit} (ref 0.000–0.400)

## 2024-10-23 MED ORDER — INSULIN ASPART 100 UNIT/ML IJ SOLN
0.0000 [IU] | Freq: Three times a day (TID) | INTRAMUSCULAR | Status: DC
  Administered 2024-10-23: 1 [IU] via SUBCUTANEOUS
  Administered 2024-10-24 – 2024-10-29 (×7): 2 [IU] via SUBCUTANEOUS
  Administered 2024-10-30: 1 [IU] via SUBCUTANEOUS
  Administered 2024-10-31: 2 [IU] via SUBCUTANEOUS
  Filled 2024-10-23: qty 1
  Filled 2024-10-23 (×3): qty 2
  Filled 2024-10-23: qty 1
  Filled 2024-10-23: qty 2
  Filled 2024-10-23: qty 1
  Filled 2024-10-23: qty 2

## 2024-10-23 MED ORDER — SODIUM CHLORIDE 0.9 % IV SOLN
200.0000 mg | INTRAVENOUS | Status: AC
Start: 2024-10-23 — End: 2024-10-25
  Administered 2024-10-23 – 2024-10-24 (×2): 200 mg via INTRAVENOUS
  Filled 2024-10-23 (×3): qty 10

## 2024-10-23 MED ORDER — DOXYCYCLINE HYCLATE 100 MG PO TABS
100.0000 mg | ORAL_TABLET | Freq: Two times a day (BID) | ORAL | Status: AC
  Administered 2024-10-23 – 2024-10-27 (×10): 100 mg via ORAL
  Filled 2024-10-23 (×10): qty 1

## 2024-10-23 MED ORDER — INSULIN ASPART 100 UNIT/ML IJ SOLN
0.0000 [IU] | Freq: Every day | INTRAMUSCULAR | Status: DC
  Filled 2024-10-23: qty 1
  Filled 2024-10-23: qty 2

## 2024-10-23 MED ORDER — HEPARIN SODIUM (PORCINE) 1000 UNIT/ML IJ SOLN
INTRAMUSCULAR | Status: AC
  Filled 2024-10-23: qty 3

## 2024-10-23 MED ORDER — DARBEPOETIN ALFA 100 MCG/0.5ML IJ SOSY
100.0000 ug | PREFILLED_SYRINGE | Freq: Once | INTRAMUSCULAR | Status: AC
  Administered 2024-10-24: 100 ug via SUBCUTANEOUS
  Filled 2024-10-23: qty 0.5

## 2024-10-23 NOTE — Progress Notes (Addendum)
 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 Family reports patient was in usual state of health in the last couple of days and started exhibiting shortness of breath and altered mental status acutely in the last 24 hours. Of note patient has previously stated she does not want dialysis due to her daughter being a dialysis patient and family confirms that has been her wishes.   Palliative medicine consulted for goals of care discussion.   SUMMARY OF RECOMMENDATIONS DNR - limited Awaiting improvement in mentation for further GOC, mentation may be improved by 11/7 to continue GOC with patient HCPOA and AD paperwork left with daughter  Symptom Management:  Per primary team  Code Status: DNR - Limited (DNR/DNI)  Prognosis: Unable to determine  Discharge Planning: To Be Determined  Discussed with: Pleas MD, daughter  Subjective:   Subjective: Chart Reviewed. Updates received. Patient  Assessed. Created space and opportunity for patient  and family to explore thoughts and feelings regarding current medical situation.  Today's Discussion: Today before meeting with the patient/family, I reviewed the chart notes including critical care notes, nephrology notes. I also reviewed vital signs, nursing flowsheets, medication administrations record, labs, and imaging.   Patient resting comfortably with daughter bedside. Daughter reports that she was restless yesterday but after receiving trazodone  patient was able to sleep throughout the night. Daughter reports that her mother was able to eat some of her meals yesterday.  Discussed with daughter about mother most likely needing more assistance going forward given some concern for memory issues and questionable medication compliance as a result of that. Offered advanced directives, HCPOA, and MOST form documentation once the patient's mentation improves and daughter was agreeable.   Update 1600: Spoke with daughter and she expressed that mother was able to answer why she is in the hospital and that she is receiving dialysis. Daughter reported that mother was agreeable to dialysis in the short term, but unsure if she would want to continue in the long term. Attempted to speak with the patient after her dialysis session but patient was somnolent and unable to fully wake up to answer questions adequately. Daughter will be staying with the patient overnight and be available for GOC discussions.   Review of Systems  Unable to perform ROS   Objective:   Primary Diagnoses: Present on Admission:  Acute respiratory failure with hypoxia (HCC)  Heart failure with reduced ejection fraction (HFrEF) (HCC)  Acute kidney injury superimposed on stage 4 chronic kidney disease (HCC)  Hypertensive urgency  Elevated troponin  Metabolic acidosis with increased anion gap and accumulation of organic acids  Thrombocytopenia  Depression  Insomnia  Acute  metabolic encephalopathy  Renal failure   Vital Signs:  BP (!) 149/80 (BP Location: Left Arm)   Pulse 76   Temp 99.3 F (37.4 C) (Axillary)   Resp 18   Wt 53.1 kg   SpO2 100%   BMI 20.74 kg/m   Physical Exam Constitutional:      Comments: Somnolent, chronically ill appearing.   HENT:     Head: Normocephalic.  Cardiovascular:     Rate and Rhythm: Normal rate.  Pulmonary:     Effort: Pulmonary effort is normal.     Comments: On BiPAP Musculoskeletal:     Right lower leg: No edema.     Left lower leg: No edema.  Skin:    General: Skin is warm.    Palliative Assessment/Data: 50%   Existing Vynca/ACP Documentation: None  Thank you for allowing us  to participate in the care of Linnell JINNY Pay PMT will continue to support holistically.  Time Total: 35 minutes  Detailed review of medical records (labs, imaging, vital signs), medically appropriate exam, discussed with treatment team, counseling and education to patient, family, & staff, documenting clinical information, medication management, coordination of care  Fairy FORBES Shan DEVONNA  Palliative Medicine Team  Team Phone # [phone] (Nights/Weekends) 10/23/2024 9:51 AM

## 2024-10-23 NOTE — Progress Notes (Signed)
 Subjective:  More alert and awake seen in hemodialysis department tolerating hemodialysis well and off BiPAP satting above 95% on room air.  Cardiac enzymes trending down EKG no significant acute ischemic changes.  Blood pressure better controlled now  Objective:  Vital Signs in the last 24 hours: Temp:  [98.3 F (36.8 C)-101.1 F (38.4 C)] 98.3 F (36.8 C) (11/06 1159) Pulse Rate:  [49-95] 78 (11/06 1430) Resp:  [15-25] 19 (11/06 1430) BP: (118-179)/(66-126) 129/80 (11/06 1430) SpO2:  [92 %-100 %] 100 % (11/06 1430) Weight:  [53.1 kg] 53.1 kg (11/06 1159)  Intake/Output from previous day: 11/05 0701 - 11/06 0700 In: 252 [P.O.:180; I.V.:72] Out: 2000  Intake/Output from this shift: No intake/output data recorded.  Physical Exam: Neck: no adenopathy, no carotid bruit, no JVD, and supple, symmetrical, trachea midline Lungs: Bilateral rhonchi noted Heart: regular rate and rhythm, S1, S2 normal, and 2/6 systolic murmur noted Abdomen: soft, non-tender; bowel sounds normal; no masses,  no organomegaly Extremities: extremities normal, atraumatic, no cyanosis or edema  Lab Results: Recent Labs    10/22/24 0602 10/23/24 0324  WBC 9.3 7.9  HGB 8.6* 7.5*  PLT 62* 61*   Recent Labs    10/22/24 0249 10/23/24 0324  NA 147* 142  K 4.2 3.7  CL 117* 108  CO2 11* 17*  GLUCOSE 136* 83  BUN 145* 108*  CREATININE 10.34* 8.04*   No results for input(s): TROPONINI in the last 72 hours.  Invalid input(s): CK, MB Hepatic Function Panel Recent Labs    10/21/24 0817 10/22/24 0249 10/23/24 0324  PROT 6.6  --   --   ALBUMIN 3.3*   < > 2.5*  AST 40  --   --   ALT 24  --   --   ALKPHOS 45  --   --   BILITOT 0.9  --   --    < > = values in this interval not displayed.   No results for input(s): CHOL in the last 72 hours. No results for input(s): PROTIME in the last 72 hours.  Imaging: Imaging results have been reviewed and DG CHEST PORT 1 VIEW Result Date:  10/22/2024 EXAM: 1 VIEW(S) XRAY OF THE CHEST 10/22/2024 01:30:00 PM COMPARISON: 10/22/2024 CLINICAL HISTORY: Respiratory failure (HCC) FINDINGS: LUNGS AND PLEURA: Mild pulmonary edema. Bilateral airspace opacities, right greater than left. No pleural effusion. No pneumothorax. HEART AND MEDIASTINUM: Unchanged cardiomegaly. Aortic atherosclerosis. No acute abnormality of the cardiac and mediastinal silhouettes. BONES AND SOFT TISSUES: Scoliotic curvature of thoracolumbar spine. No acute osseous abnormality. IMPRESSION: 1. Mild pulmonary edema with bilateral airspace opacities, right greater than left. 2. Cardiomegaly, unchanged. Electronically signed by: Lonni Necessary MD 10/22/2024 05:30 PM EST RP Workstation: [id number]   DG Chest Port 1 View Result Date: 10/22/2024 EXAM: 1 VIEW(S) XRAY OF THE CHEST 10/22/2024 05:33:45 AM COMPARISON: 10/21/2024 CLINICAL HISTORY: Hypoxia FINDINGS: LUNGS AND PLEURA: Increasing bilateral patchy lung opacities. Increasing interstitial changes. No pulmonary edema. No pleural effusion. No pneumothorax. HEART AND MEDIASTINUM: Overlapping cardiac leads. Cardiomegaly. BONES AND SOFT TISSUES: Surgical clips in right axillary region. Spine curvature and degenerative changes. IMPRESSION: 1. Interval progression of bilateral interstitial and patchy airspace opacities within both lungs. 2. Cardiomegaly. Electronically signed by: Waddell Calk MD 10/22/2024 05:39 AM EST RP Workstation: [id number]    Cardiac Studies:  Assessment/Plan:  Status post hypertensive emergency complicated by acute pulmonary edema secondary to noncompliance to medication Minimally elevated troponin I secondary to above doubt significant MI Status post acute hypoxic respiratory  failure secondary to above rule out pneumonia Acute on chronic kidney disease now started on hemodialysis Metabolic acidosis with encephalopathy improved Hypertension Diabetes mellitus Hyperlipidemia Anemia of chronic  disease History of uterine and breast carcinoma in the past History of peptic ulcer disease Degenerative joint disease Plan Continue present management per primary team Antibiotics per primary team  LOS: 2 days    Levern Hutching 10/23/2024, 2:44 PM

## 2024-10-23 NOTE — Progress Notes (Signed)
 NAME:  Erica Whitney, MRN:  [id number], DOB:  02/11/1941, LOS: 2 ADMISSION DATE:  10/21/2024, CONSULTATION DATE:  10/21/2024  REFERRING MD:  Claudene, TRH, CHIEF COMPLAINT: Respiratory distress  History of Present Illness:  83 year old with CKD 5 brought in by EMS from home due to shortness of breath for 1 day. ED vitals were blood pressure of 226/115, afebrile, tachypnea.  Chest x-ray showed diffuse interstitial edema with cardiomegaly. Labs significant for troponin 198, BNP 2214, BUN/creatinine 137/10.15, anion gap 17 She was seen by her primary nephrologist according to home patient had refused dialysis and multiple outpatient office visits.  She appeared confused and was not able to participate in decision making in the ED.  Son and daughter requested dialysis.  After discussion with nephrology, decision was made to initiate one-time dialysis, improve her sensorium and then reassess goals of care.  Palliative care was involved. PCCM was consulted and right femoral HD catheter was placed.  She was on BiPAP but subsequently became agitated, ripped off BiPAP.  PCCM was called due to bleeding from femoral line and due to agitation. She was started on dialysis on 11/5.  Pertinent  Medical History  CKD 5 Hypertension Diabetes with peripheral neuropathy Uterine cancer Breast cancer  Significant Hospital Events: Including procedures, antibiotic start and stop dates in addition to other pertinent events   Admitted on 11/4 and transferred to ICU due to agitation and necessitating nitroglycerin drip. 1st session of dialysis 11/5  Interim History / Subjective:  Improvement in mental status today. Feels less short of breath this morning. No abdominal pain. No acute concerns.  Objective    Blood pressure (!) 149/80, pulse 76, temperature 99.3 F (37.4 C), temperature source Axillary, resp. rate 18, weight 53.1 kg, SpO2 100%.    BP ranged from 118/71 to 167/92 Pulse within normal  limits Febrile to 101.1 overnight RR 18 Saturating well on 2L [place]     Intake/Output Summary (Last 24 hours) at 10/23/2024 1040 Last data filed at 10/22/2024 2142 Gross per 24 hour  Intake 212.93 ml  Output 2000 ml  Net -1787.07 ml   Filed Weights   10/22/24 0806 10/22/24 1045 10/23/24 0500  Weight: 52.9 kg 50.9 kg 53.1 kg    Examination:  General: Lying in bed in no acute distress on 2L [place] HENT: Opens eyes to voice. Sclera non-icteric. Pupils smaller than appropriate for room light. Lungs: Bilateral rhonchi, improved since yesterday Cardiovascular: S1S2. RRR Abdomen: Soft, non-tender non-distended Extremities: No peripheral edema. Blood present over dressing of HD catheter  Neuro: Awake, follows commands. Oriented x 4.   Tropoinin peaked at 1143 No leukocytosis Hgb at 7.5 from 8.6 yesterday RFP Improved after dialysis  CXR showed pulmonary edema with airspace opacities right greater than left.  Resolved problem list   Assessment and Plan   Acute hypoxic respiratory failure due to pulmonary edema CKD V now ESRD Volume overload Uremic encephalopathy  Concern for pneumonia Shortness of breath on presentation. Chest x-ray consistent with pulmonary edema. Tolerating BiPAP well. Goals of care discussion with family, nephrology, and palliative care resulted in the placement of femoral dialysis catheter against the patients known wishes. Underwent one session of dialysis with improvement in shortness of breath, placed on 2L  after BiPAP. Her encephalopathy has improved. RFP improved after dialysis. Interval chest x-ray with airspace opacities.  -Dialysis today per nephrology -Lasix  80mg  IV BID -Wean O2 as tolerated -DNR/DNI -Once patient has become less encephelopathic, plan to formalize her treatment wishes in writing. -  Due to the patients fever and concern for new potential O2 requirement, will start Doxycycline 100mg  BID for 5 days. -Stable for transfer to telemetry  floor.  Hypertensive crisis Likely due to volume overload.  Improved since yesterday after HD -BP should continue to improve with dialysis -Home medications Amlodipine  10mg  daily, Hydralazine  100mg  BID, Metoprolol  25mg  daily. -PRN Hydralazine  IV for BP over 180 -PRN Labetalol  10mg  IV for SBP over 180, second line -Lasix  80mg  IV BID  NSTEMI -Troponin elevated to 198 on admission and worsened to 1260 before downtrending -BNP 2214 -EKG showed sinus tachycardia with no ST segment elevations -Likely in the setting of volume overload/demand ischemia, also with severe MR -nitroglycerin paste per cardiology -Volume removal as above -Increase atorvastatin  to 80mg  daily -Aspirin 81mg  daily  Ischemic cardiomyopthy -BNP 2214 -showing EF 45 to 50% with severe MR and septal hypokinesis consistent with ischemic cardiomyopathy  -Volume removal with dialysis  Thrombocytopenia -Platelets 61 this morning, stable since yesterday -4T score 3 -Possibly in the setting of blood loss after placement of dialysis catheter. -CBC tomorrow -HIT antibody pending -Holding heparin products  HLD -Increase atorvastatin  to 80mg  daily  DMII Last A1C 4.4 in 2024 -SSI -BG goal 140-180  FenGI -Renal diet with fluid restriction -Protonix  daily 40mg  -Bowel regimen with Senokot 1 tablet daily Labs   CBC: Recent Labs  Lab 10/21/24 0817 10/21/24 0823 10/21/24 1501 10/22/24 0602 10/23/24 0324  WBC 6.6  --   --  9.3 7.9  NEUTROABS 5.4  --   --   --   --   HGB 9.4* 9.5* 9.2* 8.6* 7.5*  HCT 30.0* 28.0* 27.0* 27.3* 22.9*  MCV 98.7  --   --  98.9 95.4  PLT 120*  --   --  62* 61*    Basic Metabolic Panel: Recent Labs  Lab 10/21/24 0817 10/21/24 0823 10/21/24 1501 10/22/24 0248 10/22/24 0249 10/23/24 0324  NA 145 146* 146* 146* 147* 142  K 4.1 4.1 4.1 4.2 4.2 3.7  CL 115*  --   --  116* 117* 108  CO2 13*  --   --  10* 11* 17*  GLUCOSE 169*  --   --  136* 136* 83  BUN 137*  --   --  145* 145*  108*  CREATININE 10.15*  --   --  10.29* 10.34* 8.04*  CALCIUM  8.3*  --   --  8.1* 8.1* 7.8*  MG  --   --   --  1.8  --  1.7  PHOS  --   --   --  11.5* 11.5* 8.0*   GFR: CrCl cannot be calculated (Unknown ideal weight.). Recent Labs  Lab 10/21/24 0817 10/22/24 0602 10/23/24 0324  WBC 6.6 9.3 7.9    Liver Function Tests: Recent Labs  Lab 10/21/24 0817 10/22/24 0249 10/23/24 0324  AST 40  --   --   ALT 24  --   --   ALKPHOS 45  --   --   BILITOT 0.9  --   --   PROT 6.6  --   --   ALBUMIN 3.3* 3.0* 2.5*   No results for input(s): LIPASE, AMYLASE in the last 168 hours. No results for input(s): AMMONIA in the last 168 hours.  ABG    Component Value Date/Time   PHART 7.231 (L) 10/21/2024 1501   PCO2ART 31.1 (L) 10/21/2024 1501   PO2ART 204 (H) 10/21/2024 1501   HCO3 13.0 (L) 10/21/2024 1501   TCO2 14 (  L) 10/21/2024 1501   ACIDBASEDEF 13.0 (H) 10/21/2024 1501   O2SAT 100 10/21/2024 1501     Coagulation Profile: No results for input(s): INR, PROTIME in the last 168 hours.  Cardiac Enzymes: No results for input(s): CKTOTAL, CKMB, CKMBINDEX, TROPONINI in the last 168 hours.  HbA1C: Hgb A1c MFr Bld  Date/Time Value Ref Range Status  01/26/2023 02:50 AM 4.4 (L) 4.8 - 5.6 % Final    Comment:    (NOTE) Pre diabetes:          5.7%-6.4%  Diabetes:              >6.4%  Glycemic control for   <7.0% adults with diabetes     CBG: Recent Labs  Lab 10/22/24 1601 10/22/24 1951 10/22/24 2316 10/23/24 0314 10/23/24 0747  GLUCAP 91 115* 97 82 86   Past Medical History:  She,  has a past medical history of Anemia in chronic kidney disease (CKD), Arthritis, Breast cancer (HCC), Breast cancer of upper-outer quadrant of right female breast (HCC) (11/06/2014), Chronic kidney disease (CKD), stage IV (severe) (HCC), Depression, Diabetes mellitus, Hypercholesteremia, Hypertension, Insomnia, Peptic ulcer, Uterine cancer (HCC), and Wears glasses.   Surgical  History:   Past Surgical History:  Procedure Laterality Date   ABDOMINAL HYSTERECTOMY  1995   BACK SURGERY  2000   lumb lam   BIOPSY  05/14/2021   Procedure: BIOPSY;  Surgeon: Legrand Victory LITTIE DOUGLAS, MD;  Location[facility] END[facility]COPY;  Service: Gastroenterology;;   BREAST LUMPECTOMY Right 2015   COLONOSCOPY     ESOPHAGOGASTRODUODENOSCOPY N/A 05/14/2021   Procedure: ESOPHAGOGASTRODUODENOSCOPY (EGD);  Surgeon: Legrand Victory LITTIE DOUGLAS, MD;  Location: [facility]OSCOPY;  Service: Gastroenterology;  Laterality: N/A;   ESOPHAGOGASTRODUODENOSCOPY (EGD) WITH PROPOFOL  N/A 05/26/2021   Procedure: ESOPHAGOGASTRODUODENOSCOPY (EGD) WITH PROPOFOL ;  Surgeon: Rollin Dover, MD;  Location: [facility] ENDOSCOPY;  Service: Endoscopy;  Laterality: N/A;   HEMOSTASIS CLIP PLACEMENT  05/26/2021   Procedure: HEMOSTASIS CLIP PLACEMENT;  Surgeon: Rollin Dover, MD;  Location[facility] ENDOSCOPY;  Service: Endoscopy;;   HEMOSTASIS CONTROL  05/14/2021   Procedure: HEMOSTASIS CONTROL;  Surgeon: Legrand Victory LITTIE DOUGLAS, MD;  Location: [facility] ENDOSCOPY;  Service: Gastroenterology;;   HOT HEMOSTASIS N/A 05/14/2021   Procedure: HOT HEMOSTASIS (ARGON PLASMA COAGULATION/BICAP);  Surgeon: Legrand Victory LITTIE DOUGLAS, MD;  Location: [facility] ENDOSCOPY;  Service: Gastroenterology;  Laterality: N/A;   HOT HEMOSTASIS N/A 05/26/2021   Procedure: HOT HEMOSTASIS (ARGON PLASMA COAGULATION/BICAP);  Surgeon: Rollin Dover, MD;  Location: [facility] ENDOSCOPY;  Service: Endoscopy;  Laterality: N/A;   ORIF METACARPAL FRACTURE  2012   left   RADIOACTIVE SEED GUIDED PARTIAL MASTECTOMY WITH AXILLARY SENTINEL LYMPH NODE BIOPSY Right 12/17/2014   Procedure: RIGHT BREAST RADIOACTIVE SEED LOCALIZED LUMPECTOMY AND SENTINEL NODE MAPPING;  Surgeon: Deward Null III, MD;  Location: [facility];  Service: General;  Laterality: Right;   SCLEROTHERAPY  05/26/2021   Procedure: MATIAS;  Surgeon: Rollin Dover, MD;  Location: [facility] ENDOSCOPY;  Service: Endoscopy;;     Social History:   reports that she quit  smoking about 12 years ago. Her smoking use included cigarettes. She has quit using smokeless tobacco.  Her smokeless tobacco use included snuff. She reports that she does not drink alcohol and does not use drugs.   Family History:  Her family history includes Breast cancer (age of onset: 8) in her sister; Cancer in her brother and mother; Heart attack in her father; Lung cancer (age of onset: 76) in her brother; Stomach cancer (age of  onset: 95) in her mother.   Allergies Allergies  Allergen Reactions   Tape Itching and Rash     Home Medications  Prior to Admission medications   Medication Sig Start Date End Date Taking? Authorizing Provider  acetaminophen  (TYLENOL ) 650 MG CR tablet Take 1,300 mg by mouth every 8 (eight) hours as needed for pain.   Yes [provider]  amLODipine  (NORVASC ) 10 MG tablet Take 1 tablet (10 mg total) by mouth daily. 05/17/21  Yes Gonfa, Taye T, MD  atorvastatin  (LIPITOR) 40 MG tablet Take 40 mg by mouth daily.   Yes [provider]  calcitRIOL (ROCALTROL) 0.25 MCG capsule Take 0.25 mcg by mouth daily. 12/22/22  Yes [provider]  Camphor-Menthol-Methyl Sal (SALONPAS EX) Apply 1 patch topically as needed (pain).   Yes [provider]  cycloSPORINE (RESTASIS) 0.05 % ophthalmic emulsion Place 1 drop into both eyes daily.   Yes [provider]  ferrous sulfate  325 (65 FE) MG tablet Take 1 tablet (325 mg total) by mouth daily with breakfast. 05/31/21  Yes Amin, Ankit C, MD  furosemide  (LASIX ) 40 MG tablet Take 1 tablet (40 mg total) by mouth daily as needed for fluid (ankle swelling). Patient taking differently: Take 40 mg by mouth 2 (two) times daily. 05/23/21  Yes Gonfa, Taye T, MD  hydrALAZINE  (APRESOLINE ) 100 MG tablet Take 100 mg by mouth 2 (two) times daily.   Yes [provider]  metoprolol  succinate (TOPROL -XL) 25 MG 24 hr tablet Take 25 mg by mouth daily. 12/22/22  Yes [provider]  pantoprazole   (PROTONIX ) 40 MG tablet Take 40 mg by mouth daily.   Yes [provider]  senna-docusate (SENOKOT-S) 8.6-50 MG tablet Take 1 tablet by mouth at bedtime as needed for mild constipation or moderate constipation. 05/30/21  Yes Amin, Ankit C, MD  traZODone  (DESYREL ) 50 MG tablet Take 50 mg by mouth at bedtime. 01/19/23  Yes [provider]  mirtazapine  (REMERON ) 30 MG tablet Take 30 mg by mouth at bedtime. Patient not taking: Reported on 10/21/2024 04/07/21   [provider]  sertraline  (ZOLOFT ) 100 MG tablet Take 100 mg by mouth every morning. Patient not taking: Reported on 10/21/2024    [provider]    Melvenia Morrison, PGY1 Internal Medicine Residency Critical care service

## 2024-10-23 NOTE — Plan of Care (Signed)
 A+OX4,  ate 25% of meals, dialysis session today. Lasix  given no, UOP, no bm.  Problem: Education: Goal: Ability to demonstrate management of disease process will improve 10/23/2024 1617 by Val Violeta FALCON, RN Outcome: Progressing 10/23/2024 1617 by Val Violeta FALCON, RN Outcome: Progressing Goal: Ability to verbalize understanding of medication therapies will improve 10/23/2024 1617 by Val Violeta FALCON, RN Outcome: Progressing 10/23/2024 1617 by Val Violeta FALCON, RN Outcome: Progressing   Problem: Activity: Goal: Capacity to carry out activities will improve 10/23/2024 1617 by Val Violeta FALCON, RN Outcome: Progressing 10/23/2024 1617 by Val Violeta FALCON, RN Outcome: Progressing   Problem: Cardiac: Goal: Ability to achieve and maintain adequate cardiopulmonary perfusion will improve 10/23/2024 1617 by Val Violeta FALCON, RN Outcome: Progressing 10/23/2024 1617 by Val Violeta FALCON, RN Outcome: Progressing   Problem: Education: Goal: Knowledge of General Education information will improve Description: Including pain rating scale, medication(s)/side effects and non-pharmacologic comfort measures 10/23/2024 1617 by Val Violeta FALCON, RN Outcome: Progressing 10/23/2024 1617 by Val Violeta FALCON, RN Outcome: Progressing   Problem: Health Behavior/Discharge Planning: Goal: Ability to manage health-related needs will improve 10/23/2024 1617 by Val Violeta FALCON, RN Outcome: Progressing 10/23/2024 1617 by Val Violeta FALCON, RN Outcome: Progressing   Problem: Clinical Measurements: Goal: Ability to maintain clinical measurements within normal limits will improve 10/23/2024 1617 by Val Violeta FALCON, RN Outcome: Progressing 10/23/2024 1617 by Val Violeta FALCON, RN Outcome: Progressing Goal: Will remain free from infection 10/23/2024 1617 by Val Violeta FALCON, RN Outcome: Progressing 10/23/2024 1617 by Val Violeta FALCON, RN Outcome: Progressing Goal: Diagnostic test  results will improve 10/23/2024 1617 by Val Violeta FALCON, RN Outcome: Progressing 10/23/2024 1617 by Val Violeta FALCON, RN Outcome: Progressing Goal: Respiratory complications will improve 10/23/2024 1617 by Val Violeta FALCON, RN Outcome: Progressing 10/23/2024 1617 by Val Violeta FALCON, RN Outcome: Progressing Goal: Cardiovascular complication will be avoided 10/23/2024 1617 by Val Violeta FALCON, RN Outcome: Progressing 10/23/2024 1617 by Val Violeta FALCON, RN Outcome: Progressing   Problem: Activity: Goal: Risk for activity intolerance will decrease 10/23/2024 1617 by Val Violeta FALCON, RN Outcome: Progressing 10/23/2024 1617 by Val Violeta FALCON, RN Outcome: Progressing   Problem: Nutrition: Goal: Adequate nutrition will be maintained 10/23/2024 1617 by Val Violeta FALCON, RN Outcome: Progressing 10/23/2024 1617 by Val Violeta FALCON, RN Outcome: Progressing   Problem: Coping: Goal: Level of anxiety will decrease Outcome: Progressing   Problem: Elimination: Goal: Will not experience complications related to bowel motility Outcome: Progressing Goal: Will not experience complications related to urinary retention Outcome: Progressing   Problem: Pain Managment: Goal: General experience of comfort will improve and/or be controlled Outcome: Progressing   Problem: Safety: Goal: Ability to remain free from injury will improve Outcome: Progressing   Problem: Skin Integrity: Goal: Risk for impaired skin integrity will decrease Outcome: Progressing

## 2024-10-23 NOTE — Progress Notes (Addendum)
 Patient ID: Linnell JINNY Pay, female   DOB: 07-24-1941, 83 y.o.   MRN: [id number] S: No new complaints.  Tolerated HD yesterday and is more awake and alert this morning.  O:BP (!) 149/80 (BP Location: Left Arm)   Pulse 76   Temp 99.3 F (37.4 C) (Axillary)   Resp 18   Wt 53.1 kg   SpO2 100%   BMI 20.74 kg/m   Intake/Output Summary (Last 24 hours) at 10/23/2024 0943 Last data filed at 10/22/2024 2142 Gross per 24 hour  Intake 217.68 ml  Output 2000 ml  Net -1782.32 ml   Intake/Output: I/O last 3 completed shifts: In: 376.6 [P.O.:180; I.V.:196.6] Out: 2000 [Other:2000]  Intake/Output this shift:  No intake/output data recorded. Weight change: 0.8 kg Gen: cachectic, elderly female in NAD CVS: RRR Resp: scattered crackles bilaterally Abd: +BS, soft, NT/ND Ext: no edema  Recent Labs  Lab 10/21/24 0817 10/21/24 0823 10/21/24 1501 10/22/24 0248 10/22/24 0249 10/23/24 0324  NA 145 146* 146* 146* 147* 142  K 4.1 4.1 4.1 4.2 4.2 3.7  CL 115*  --   --  116* 117* 108  CO2 13*  --   --  10* 11* 17*  GLUCOSE 169*  --   --  136* 136* 83  BUN 137*  --   --  145* 145* 108*  CREATININE 10.15*  --   --  10.29* 10.34* 8.04*  ALBUMIN 3.3*  --   --   --  3.0* 2.5*  CALCIUM  8.3*  --   --  8.1* 8.1* 7.8*  PHOS  --   --   --  11.5* 11.5* 8.0*  AST 40  --   --   --   --   --   ALT 24  --   --   --   --   --    Liver Function Tests: Recent Labs  Lab 10/21/24 0817 10/22/24 0249 10/23/24 0324  AST 40  --   --   ALT 24  --   --   ALKPHOS 45  --   --   BILITOT 0.9  --   --   PROT 6.6  --   --   ALBUMIN 3.3* 3.0* 2.5*   No results for input(s): LIPASE, AMYLASE in the last 168 hours. No results for input(s): AMMONIA in the last 168 hours. CBC: Recent Labs  Lab 10/21/24 0817 10/21/24 0823 10/21/24 1501 10/22/24 0602 10/23/24 0324  WBC 6.6  --   --  9.3 7.9  NEUTROABS 5.4  --   --   --   --   HGB 9.4*   < > 9.2* 8.6* 7.5*  HCT 30.0*   < > 27.0* 27.3* 22.9*  MCV 98.7   --   --  98.9 95.4  PLT 120*  --   --  62* 61*   < > = values in this interval not displayed.   Cardiac Enzymes: No results for input(s): CKTOTAL, CKMB, CKMBINDEX, TROPONINI in the last 168 hours. CBG: Recent Labs  Lab 10/22/24 1601 10/22/24 1951 10/22/24 2316 10/23/24 0314 10/23/24 0747  GLUCAP 91 115* 97 82 86    Iron  Studies:  Recent Labs    10/22/24 1336  IRON  18*  TIBC 140*  FERRITIN 273   Studies/Results: DG CHEST PORT 1 VIEW Result Date: 10/22/2024 EXAM: 1 VIEW(S) XRAY OF THE CHEST 10/22/2024 01:30:00 PM COMPARISON: 10/22/2024 CLINICAL HISTORY: Respiratory failure (HCC) FINDINGS: LUNGS AND PLEURA: Mild pulmonary edema. Bilateral airspace opacities,  right greater than left. No pleural effusion. No pneumothorax. HEART AND MEDIASTINUM: Unchanged cardiomegaly. Aortic atherosclerosis. No acute abnormality of the cardiac and mediastinal silhouettes. BONES AND SOFT TISSUES: Scoliotic curvature of thoracolumbar spine. No acute osseous abnormality. IMPRESSION: 1. Mild pulmonary edema with bilateral airspace opacities, right greater than left. 2. Cardiomegaly, unchanged. Electronically signed by: Lonni Necessary MD 10/22/2024 05:30 PM EST RP Workstation: [id number]   DG Chest Port 1 View Result Date: 10/22/2024 EXAM: 1 VIEW(S) XRAY OF THE CHEST 10/22/2024 05:33:45 AM COMPARISON: 10/21/2024 CLINICAL HISTORY: Hypoxia FINDINGS: LUNGS AND PLEURA: Increasing bilateral patchy lung opacities. Increasing interstitial changes. No pulmonary edema. No pleural effusion. No pneumothorax. HEART AND MEDIASTINUM: Overlapping cardiac leads. Cardiomegaly. BONES AND SOFT TISSUES: Surgical clips in right axillary region. Spine curvature and degenerative changes. IMPRESSION: 1. Interval progression of bilateral interstitial and patchy airspace opacities within both lungs. 2. Cardiomegaly. Electronically signed by: Waddell Calk MD 10/22/2024 05:39 AM EST RP Workstation: [id number]    ECHOCARDIOGRAM COMPLETE Result Date: 10/21/2024    ECHOCARDIOGRAM REPORT   Patient Name:   JACLYNE HAVERSTICK Seefeldt Date of Exam: 10/21/2024 Medical Rec #:  [id number]          Height:       63.0 in Accession #:    [id number]         Weight:       114.0 lb Date of Birth:  1941/04/20         BSA:          1.523 m Patient Age:    82 years           BP:           123/96 mmHg Patient Gender: F                  HR:           80 bpm. Exam Location:  Inpatient Procedure: 2D Echo (Both Spectral and Color Flow Doppler were utilized during            procedure). Indications:     Dyspnea  History:         Patient has no prior history of Echocardiogram examinations.                  Signs/Symptoms:Dyspnea.  Sonographer:     Norleen Amour Referring Phys:  [id number] RONDELL A SMITH Diagnosing Phys: Salena Negri MD IMPRESSIONS  1. Left ventricular ejection fraction, by estimation, is 45 to 50%. The left ventricle has mildly decreased function. The left ventricle demonstrates regional wall motion abnormalities (see scoring diagram/findings for description). The left ventricular  internal cavity size was mildly dilated. Left ventricular diastolic parameters are consistent with Grade I diastolic dysfunction (impaired relaxation). There is moderate hypokinesis of the left ventricular, entire septal wall. The average left ventricular global longitudinal strain is -21.7 %. The global longitudinal strain is abnormal.  2. Right ventricular systolic function is normal. The right ventricular size is normal. There is moderately elevated pulmonary artery systolic pressure.  3. Left atrial size was severely dilated.  4. Right atrial size was mildly dilated.  5. There is no evidence of cardiac tamponade.  6. The mitral valve is degenerative. Severe mitral valve regurgitation.  7. Tricuspid valve regurgitation is moderate.  8. The aortic valve is tricuspid. There is mild calcification of the aortic valve. Aortic valve regurgitation is trivial.  Aortic valve sclerosis is present, with no evidence of aortic valve stenosis.  9. The inferior vena cava  is dilated in size with <50% respiratory variability, suggesting right atrial pressure of 15 mmHg. 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 FINDINGS  Left Ventricle: Left ventricular ejection fraction, by estimation, is 45 to 50%. The left ventricle has mildly decreased function. The left ventricle demonstrates regional wall motion abnormalities. Moderate hypokinesis of the left ventricular, entire septal wall. The average left ventricular global longitudinal strain is -21.7 %. Strain was performed and the global longitudinal strain is abnormal. The left ventricular internal cavity size was mildly dilated. There is borderline concentric left ventricular hypertrophy. Left ventricular diastolic parameters are consistent with Grade I diastolic dysfunction (impaired relaxation).  LV Wall Scoring: The entire septum is hypokinetic. The entire anterior wall, entire lateral wall, entire inferior wall, and apex are normal. Right Ventricle: The right ventricular size is normal. No increase in right ventricular wall thickness. Right ventricular systolic function is normal. There is moderately elevated pulmonary artery systolic pressure. The tricuspid regurgitant velocity is 3.49 m/s, and with an assumed right atrial pressure of 8 mmHg, the estimated right ventricular systolic pressure is 56.7 mmHg. Left Atrium: Left atrial size was severely dilated. Right Atrium: Right atrial size was mildly dilated. Pericardium: Trivial pericardial effusion is present. The pericardial effusion is posterior to the left ventricle. There is no evidence of cardiac tamponade. Mitral Valve: The mitral valve is degenerative in appearance. Severe mitral valve regurgitation. Tricuspid Valve: The tricuspid valve is grossly normal. Tricuspid valve regurgitation is moderate.  Aortic Valve: The aortic valve is tricuspid. There is mild calcification of the aortic valve. There is mild aortic valve annular calcification. Aortic valve regurgitation is trivial. Aortic valve sclerosis is present, with no evidence of aortic valve stenosis. Pulmonic Valve: The pulmonic valve was normal in structure. Pulmonic valve regurgitation is trivial. Aorta: The aortic root is normal in size and structure. Venous: The inferior vena cava is dilated in size with less than 50% respiratory variability, suggesting right atrial pressure of 15 mmHg. IAS/Shunts: The atrial septum is grossly normal. Additional Comments: 3D was performed not requiring image post processing on an independent workstation and was indeterminate.  LEFT VENTRICLE PLAX 2D LVIDd:         5.70 cm      Diastology LVIDs:         4.20 cm      LV e' medial:    4.99 cm/s LV PW:         1.00 cm      LV E/e' medial:  27.9 LV IVS:        0.90 cm      LV e' lateral:   9.11 cm/s LVOT diam:     2.00 cm      LV E/e' lateral: 15.3 LV SV:         63 LV SV Index:   42           2D Longitudinal Strain LVOT Area:     3.14 cm     2D Strain GLS (A4C):   -19.9 % LV IVRT:       71 msec      2D Strain GLS (A3C):   -21.9 %                             2D Strain GLS (A2C):   -23.3 %  2D Strain GLS Avg:     -21.7 % LV Volumes (MOD) LV vol d, MOD A2C: 114.0 ml LV vol d, MOD A4C: 128.0 ml LV vol s, MOD A2C: 55.9 ml LV vol s, MOD A4C: 62.2 ml LV SV MOD A2C:     58.1 ml LV SV MOD A4C:     128.0 ml LV SV MOD BP:      64.0 ml RIGHT VENTRICLE RV Basal diam:  4.30 cm     PULMONARY VEINS RV S prime:     13.40 cm/s  Diastolic Velocity: 71.00 cm/s TAPSE (M-mode): 2.3 cm      S/D Velocity:       0.60                             Systolic Velocity:  45.50 cm/s LEFT ATRIUM              Index        RIGHT ATRIUM           Index LA diam:        4.30 cm  2.82 cm/m   RA Area:     18.60 cm LA Vol (A2C):   91.3 ml  59.96 ml/m  RA Volume:   54.00 ml  35.46  ml/m LA Vol (A4C):   115.0 ml 75.52 ml/m LA Biplane Vol: 102.0 ml 66.99 ml/m  AORTIC VALVE             PULMONIC VALVE LVOT Vmax:   105.00 cm/s PV Vmax:       1.13 m/s LVOT Vmean:  67.200 cm/s PV Peak grad:  5.1 mmHg LVOT VTI:    0.202 m  AORTA Ao Root diam: 2.70 cm Ao Asc diam:  3.00 cm MITRAL VALVE                  TRICUSPID VALVE MV Area (PHT): 5.13 cm       TR Peak grad:   48.7 mmHg MV Decel Time: 148 msec       TR Vmax:        349.00 cm/s MR Peak grad:    189.9 mmHg MR Mean grad:    127.0 mmHg   SHUNTS MR Vmax:         689.00 cm/s  Systemic VTI:  0.20 m MR Vmean:        535.0 cm/s   Systemic Diam: 2.00 cm MR PISA:         1.57 cm MR PISA Eff ROA: 7 mm MR PISA Radius:  0.50 cm MV E velocity: 139.00 cm/s MV A velocity: 115.00 cm/s MV E/A ratio:  1.21 Salena Negri MD Electronically signed by Salena Negri MD Signature Date/Time: 10/21/2024/2:49:25 PM    Final     amLODipine   10 mg Oral Daily   aspirin EC  81 mg Oral Daily   atorvastatin   80 mg Oral Daily   calcitRIOL  0.25 mcg Oral Daily   Chlorhexidine  Gluconate Cloth  6 each Topical Q0600   cycloSPORINE  1 drop Both Eyes Daily   [START ON 10/24/2024] darbepoetin (ARANESP) injection - DIALYSIS  100 mcg Subcutaneous Once   feeding supplement (NEPRO CARB STEADY)  237 mL Oral BID BM   ferrous sulfate   325 mg Oral Q breakfast   furosemide   80 mg Intravenous BID   hydrALAZINE   100 mg Oral BID   insulin  aspart  0-6 Units Subcutaneous Q4H  metoprolol  succinate  25 mg Oral Daily   nitroGLYCERIN  0.5 inch Topical Q6H   pantoprazole   40 mg Oral Daily   sodium chloride  flush  3 mL Intravenous Q12H    BMET    Component Value Date/Time   NA 142 10/23/2024 0324   NA 141 11/15/2017 0759   K 3.7 10/23/2024 0324   K 4.4 11/15/2017 0759   CL 108 10/23/2024 0324   CO2 17 (L) 10/23/2024 0324   CO2 26 11/15/2017 0759   GLUCOSE 83 10/23/2024 0324   GLUCOSE 176 (H) 11/15/2017 0759   BUN 108 (H) 10/23/2024 0324   BUN 29.8 (H) 11/15/2017 0759    CREATININE 8.04 (H) 10/23/2024 0324   CREATININE 4.38 (H) 02/27/2023 0918   CREATININE 1.1 11/15/2017 0759   CALCIUM  7.8 (L) 10/23/2024 0324   CALCIUM  10.6 (H) 11/15/2017 0759   GFRNONAA 5 (L) 10/23/2024 0324   GFRNONAA 11 (L) 02/15/2023 0946   GFRNONAA 17 (L) 01/27/2021 0950   GFRAA 20 (L) 01/27/2021 0950   CBC    Component Value Date/Time   WBC 7.9 10/23/2024 0324   RBC 2.40 (L) 10/23/2024 0324   HGB 7.5 (L) 10/23/2024 0324   HGB 9.6 (L) 02/15/2023 0946   HGB 10.8 (L) 11/15/2017 0759   HCT 22.9 (L) 10/23/2024 0324   HCT 33.1 (L) 11/15/2017 0759   PLT 61 (L) 10/23/2024 0324   PLT 253 02/15/2023 0946   PLT 196 11/15/2017 0759   MCV 95.4 10/23/2024 0324   MCV 90.8 11/15/2017 0759   MCH 31.3 10/23/2024 0324   MCHC 32.8 10/23/2024 0324   RDW 18.1 (H) 10/23/2024 0324   RDW 14.2 11/15/2017 0759   LYMPHSABS 0.4 (L) 10/21/2024 0817   LYMPHSABS 2.0 11/15/2017 0759   MONOABS 0.7 10/21/2024 0817   MONOABS 0.9 11/15/2017 0759   EOSABS 0.0 10/21/2024 0817   EOSABS 0.2 11/15/2017 0759   BASOSABS 0.0 10/21/2024 0817   BASOSABS 0.1 11/15/2017 0759    Candence J Belgarde is an 83 y.o. 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  Chest x-ray shows pulmonary vascular congestion, sublingual nitro as well as nitroglycerin drip with drop in blood pressure to 152/126 as well as improvement in oxygenation after starting BiPAP.  Patient has had history of orthopnea but denies any significant leg swelling.  Patient was just seen at [facility] on 10/06/2024 at which time Lasix  was increased to 40 mg twice daily for 4 days and then back to 40 mg daily.  Patient does not want dialysis after seeing her daughter and  sister being on dialysis.  Patient already followed by palliative care.    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  As an outpt followed by Dr. Jerrye for >2 years she made it very clear that she didn't want dialysis after seeing her daughter and sister being on dialysis, already followed by palliative care as outpt. - diurese aggressively; very  w medications, lives by herself. Her daughter is doing the best she can. . - Appreciate palliative care seeing her in the hospital as well and also Dr. Jerrye speaking with the family at length on 11/4. The family (brother and sister) are adamant that they want dialysis to be  initiated despite extensive conversation about the patient not wanting to ever be on dialysis. They expressed understanding that they are going against her wishes.  She underwent first session yesterday and is more awake and alert this morning.  When asked about ongoing dialysis, she responded well they already started it.  She plans to continue with HD for now.  For second session today.  She is stable to have HD in [facility] today.  Will plan for 3rd session tomorrow.    Appreciate CCM placing a right fem temporary catheter so promptly on 11/4   Seen on HD #1 3K bath low flows and shortened tx to decrease risk of DE Goal 2L UF   Planning on HD #2 tomorrow   When the patient is no longer uremic and able to make her decisions we can address whether to continue at that time. Would hold off on TC + CLIP until she's of clear mind.   -Monitor Daily I/Os, Daily weight  -Maintain MAP>65 for optimal renal perfusion.  - Avoid nephrotoxic agents such as IV contrast, NSAIDs, and phosphate containing bowel preps (FLEETS)   **Hypertensive emergency secondary to noncompliance seen by cardiology - starting BiDil, restart home meds; avoid ACEi/ARB with advanced kidney disease    **Anemia:  receives ESA q2wks outpt and IV iron  PRN.  Hb 7.5.  given IV iron  and Aranesp.  Transfuse  if Hgb continues to drop   **CHF with BNP 2214 - diurese aggressively as tolerated   **HTN: high in setting of missed meds -- restarted here as well as addition of BiDil   **DM: per primary  Fairy RONAL Sellar, MD [facility]

## 2024-10-23 NOTE — Procedures (Signed)
 HD Note:  Some information was entered later than the data was gathered due to patient care needs. The stated time with the data is accurate.  Received patient in bed to unit.   Alert and oriented.   Informed consent signed and in chart.   Access used: right femoral trialysis HD catheter Access issues: None  Patient dressing was saturated with blood, bio disc saturated.  Dressing change with no active oozing apparent.  Patient moves right leg up frequently and this may be the cause of the oozing.   Patient tolerated treatment well.   TX duration: 3 hours  Alert, without acute distress.  Total UF removed: 2500 ml  Hand-off given to patient's nurse.   Transported back to the room   Roderick Calo L. Lenon, RN [facility].

## 2024-10-24 ENCOUNTER — Other Ambulatory Visit: Payer: Self-pay

## 2024-10-24 DIAGNOSIS — I161 Hypertensive emergency: Secondary | ICD-10-CM | POA: Diagnosis not present

## 2024-10-24 DIAGNOSIS — J9601 Acute respiratory failure with hypoxia: Secondary | ICD-10-CM | POA: Diagnosis not present

## 2024-10-24 DIAGNOSIS — J810 Acute pulmonary edema: Secondary | ICD-10-CM | POA: Diagnosis not present

## 2024-10-24 DIAGNOSIS — N179 Acute kidney failure, unspecified: Secondary | ICD-10-CM | POA: Diagnosis not present

## 2024-10-24 LAB — CBC
HCT: 22 % — ABNORMAL LOW (ref 36.0–46.0)
Hemoglobin: 7.2 g/dL — ABNORMAL LOW (ref 12.0–15.0)
MCH: 30.9 pg (ref 26.0–34.0)
MCHC: 32.7 g/dL (ref 30.0–36.0)
MCV: 94.4 fL (ref 80.0–100.0)
Platelets: 70 K/uL — ABNORMAL LOW (ref 150–400)
RBC: 2.33 MIL/uL — ABNORMAL LOW (ref 3.87–5.11)
RDW: 17.4 % — ABNORMAL HIGH (ref 11.5–15.5)
WBC: 5.8 K/uL (ref 4.0–10.5)
nRBC: 0 % (ref 0.0–0.2)

## 2024-10-24 LAB — RENAL FUNCTION PANEL
Albumin: 2.3 g/dL — ABNORMAL LOW (ref 3.5–5.0)
Anion gap: 15 (ref 5–15)
BUN: 65 mg/dL — ABNORMAL HIGH (ref 8–23)
CO2: 21 mmol/L — ABNORMAL LOW (ref 22–32)
Calcium: 7.4 mg/dL — ABNORMAL LOW (ref 8.9–10.3)
Chloride: 100 mmol/L (ref 98–111)
Creatinine, Ser: 5.65 mg/dL — ABNORMAL HIGH (ref 0.44–1.00)
GFR, Estimated: 7 mL/min — ABNORMAL LOW (ref >60)
Glucose, Bld: 81 mg/dL (ref 70–99)
Phosphorus: 4.9 mg/dL — ABNORMAL HIGH (ref 2.5–4.6)
Potassium: 3.1 mmol/L — ABNORMAL LOW (ref 3.5–5.1)
Sodium: 136 mmol/L (ref 135–145)

## 2024-10-24 LAB — GLUCOSE, CAPILLARY
Glucose-Capillary: 76 mg/dL (ref 70–99)
Glucose-Capillary: 160 mg/dL — ABNORMAL HIGH (ref 70–99)
Glucose-Capillary: 118 mg/dL — ABNORMAL HIGH (ref 70–99)
Glucose-Capillary: 128 mg/dL — ABNORMAL HIGH (ref 70–99)

## 2024-10-24 MED ORDER — ISOSORBIDE MONONITRATE ER 30 MG PO TB24
30.0000 mg | ORAL_TABLET | Freq: Every day | ORAL | Status: DC
  Filled 2024-10-24: qty 1

## 2024-10-24 MED ORDER — ISOSORB DINITRATE-HYDRALAZINE 20-37.5 MG PO TABS
2.0000 | ORAL_TABLET | Freq: Three times a day (TID) | ORAL | Status: DC
  Administered 2024-10-24 – 2024-10-31 (×18): 2 via ORAL
  Filled 2024-10-24 (×24): qty 2

## 2024-10-24 NOTE — Evaluation (Signed)
 Physical Therapy Evaluation Patient Details Name: Erica Whitney MRN: [id number] DOB: 09/10/1941 Today's Date: 10/24/2024  History of Present Illness  Pt is an 83 y.o. female who presented 10/21/24 with SOB, AMS, and R-sided pain. Pt admitted with acute hypoxic respiratory failure due to pulmonary edema, CKD V now ESRD, volume overload, uremic encephalopathy, and concern for pneumonia. PMH: anemia in CKD, arthritis, breast cancer, CKD stage IV, depression, DM, hypercholesteremia, HTN, insomnia, peptic ulcer, uterine cancer   Clinical Impression  Pt presents with condition above and deficits mentioned below, see PT Problem List. PTA, pt was mod I intermittently holding onto furniture when mobilizing in her home and using a rollator in the community. She lives alone in a 1-level apartment with a level entry and her daughter checks on her daily pre and post work. The daughter is hoping to arrange 24/7 care with the assistance from [facility] aides at d/c. The pt has baseline memory deficits per the daughter and appears to be approaching her cognitive baseline, but displays current deficits in attention, awareness, and initiation. She also seems to be at risk for wandering. She currently displays deficits also in balance, power, generalized strength, and endurance and is at high risk for falls. She is currently requiring min-modA for transfers and minA to ambulate with a RW. She would be unsafe to be home alone at this time. If her daughter can get [facility] aides to bridge the gap between when the daughter works to help arrange 24/7 care at d/c then the pt could d/c home with HHPT. If not, then would recommend short-term inpatient rehab, < 3 hours/day. Will continue to follow acutely.        If plan is discharge home, recommend the following: A little help with walking and/or transfers;A little help with bathing/dressing/bathroom;Assistance with cooking/housework;Direct supervision/assist for medications  management;Direct supervision/assist for financial management;Assist for transportation;Supervision due to cognitive status   Can travel by private vehicle   Yes    Equipment Recommendations None recommended by PT  Recommendations for Other Services  OT consult    Functional Status Assessment Patient has had a recent decline in their functional status and demonstrates the ability to make significant improvements in function in a reasonable and predictable amount of time.     Precautions / Restrictions Precautions Precautions: Fall Restrictions Weight Bearing Restrictions Per Provider Order: No      Mobility  Bed Mobility Overal bed mobility: Needs Assistance Bed Mobility: Supine to Sit, Sit to Supine     Supine to sit: Contact guard, HOB elevated Sit to supine: Contact guard assist, Min assist, HOB elevated   General bed mobility comments: Extra time and cues needed to direct pt OOB, CGA for safety. CGA for safety the first rep back to supine, minA to lift legs the second rep    Transfers Overall transfer level: Needs assistance Equipment used: Rolling walker (2 wheels) Transfers: Sit to/from Stand Sit to Stand: Min assist, Mod assist           General transfer comment: Cues needed for hand placement and min-modA needed to shift weight anteriorly and power up to stand, 2x from EOB and 1x from commode    Ambulation/Gait Ambulation/Gait assistance: Min assist Gait Distance (Feet): 110 Feet (x3 bouts of ~100 ft > ~15 ft > ~15 ft) Assistive device: Rolling walker (2 wheels) Gait Pattern/deviations: Step-through pattern, Decreased step length - right, Decreased step length - left, Decreased stride length, Trunk flexed Gait velocity: reduced Gait velocity interpretation: <1.31  ft/sec, indicative of household ambulator   General Gait Details: Pt takes slow, small steps with a flexed posture. Pt needed frequent cues to remain more proximal to her RW with poor carryover  noted. Pt often also trying to leave RW to ambulate in the room without it, needing cues to keep it with her. MinA needed for balance and directing pt  Stairs            Wheelchair Mobility     Tilt Bed    Modified Rankin (Stroke Patients Only)       Balance Overall balance assessment: Needs assistance Sitting-balance support: No upper extremity supported, Feet supported Sitting balance-Leahy Scale: Good     Standing balance support: Bilateral upper extremity supported, During functional activity, No upper extremity supported Standing balance-Leahy Scale: Fair Standing balance comment: Able to stand statically without UE support with trunk sway and CGA-minA. Reliant on RW and minA to ambulate                             Pertinent Vitals/Pain Pain Assessment Pain Assessment: Faces Faces Pain Scale: Hurts a little bit Pain Location: generalized with mobility Pain Descriptors / Indicators: Discomfort, Grimacing Pain Intervention(s): Monitored during session, Limited activity within patient's tolerance, Repositioned    Home Living Family/patient expects to be discharged to:: Private residence Living Arrangements: Alone Available Help at Discharge: Family;Available PRN/intermittently (daughter works; son assists PRN also; they can check on her daily) Type of Home: Apartment Home Access: Level entry       Home Layout: One level Home Equipment: Rollator (4 wheels);Tub bench;Rolling Walker (2 wheels)      Prior Function Prior Level of Function : Independent/Modified Independent;History of Falls (last six months)             Mobility Comments: uses rollator outside, holds onto furniture inside; hx of falls but pt unable to recall how many ADLs Comments: Daughter assists in cooking at times and with med management; bathes and dresses herself; daughter drives her to appointments     Extremity/Trunk Assessment   Upper Extremity Assessment Upper  Extremity Assessment: Defer to OT evaluation    Lower Extremity Assessment Lower Extremity Assessment: Generalized weakness (grossly symmetrically 4- to 4; denied numbness/tingling bil)    Cervical / Trunk Assessment Cervical / Trunk Assessment: Kyphotic  Communication   Communication Communication: Impaired Factors Affecting Communication: Hearing impaired    Cognition Arousal: Alert Behavior During Therapy: WFL for tasks assessed/performed   PT - Cognitive impairments: History of cognitive impairments, Orientation, Awareness, Attention, Memory, Initiation, Sequencing, Safety/Judgement   Orientation impairments: Time                   PT - Cognition Comments: Disoriented to date, stating June then October and then 61 something for year. Daughter reports pt never knows the year or date. Daughter reports pt has a hx of some memory deficits but no diagnosis of dementia. Appears to be approaching her baseline, but still displays poor attention to task, initiation, and awareness of her safety, needing cues to direct her when ambulating and avoid wandering. Following commands: Impaired Following commands impaired: Follows one step commands with increased time, Follows multi-step commands inconsistently, Follows multi-step commands with increased time     Cueing Cueing Techniques: Verbal cues, Tactile cues, Gestural cues     General Comments General comments (skin integrity, edema, etc.): VSS; educated daughter on pt's risk for falls and current recs  for 24/7 care at d/c, she verbalized understanding    Exercises     Assessment/Plan    PT Assessment Patient needs continued PT services  PT Problem List Decreased strength;Decreased activity tolerance;Decreased balance;Decreased mobility;Decreased cognition;Decreased knowledge of use of DME;Decreased safety awareness       PT Treatment Interventions DME instruction;Gait training;Stair training;Functional mobility  training;Therapeutic activities;Therapeutic exercise;Balance training;Neuromuscular re-education;Cognitive remediation;Patient/family education    PT Goals (Current goals can be found in the Care Plan section)  Acute Rehab PT Goals Patient Stated Goal: to get better PT Goal Formulation: With patient/family Time For Goal Achievement: 11/07/24 Potential to Achieve Goals: Good    Frequency Min 2X/week     Co-evaluation               AM-PAC PT 6 Clicks Mobility  Outcome Measure Help needed turning from your back to your side while in a flat bed without using bedrails?: A Little Help needed moving from lying on your back to sitting on the side of a flat bed without using bedrails?: A Little Help needed moving to and from a bed to a chair (including a wheelchair)?: A Lot Help needed standing up from a chair using your arms (e.g., wheelchair or bedside chair)?: A Little Help needed to walk in hospital room?: A Little Help needed climbing 3-5 steps with a railing? : A Lot 6 Click Score: 16    End of Session Equipment Utilized During Treatment: Gait belt Activity Tolerance: Patient tolerated treatment well Patient left: in bed;with call bell/phone within reach;with bed alarm set;with family/visitor present Nurse Communication: Mobility status PT Visit Diagnosis: Unsteadiness on feet (R26.81);Other abnormalities of gait and mobility (R26.89);Muscle weakness (generalized) (M62.81);Difficulty in walking, not elsewhere classified (R26.2)    Time: 0811-0905 PT Time Calculation (min) (ACUTE ONLY): 54 min   Charges:   PT Evaluation $PT Eval Moderate Complexity: 1 Mod PT Treatments $Gait Training: 23-37 mins $Therapeutic Activity: 8-22 mins PT General Charges $$ ACUTE PT VISIT: 1 Visit         [name], PT, DPT Acute Rehabilitation Services  Office: [phone]   [name] 10/24/2024, 9:14 AM

## 2024-10-24 NOTE — Progress Notes (Addendum)
 PROGRESS NOTE        PATIENT DETAILS Name: Erica Whitney Age: 83 y.o. Sex: female Date of Birth: May 20, 1941 Admit Date: 10/21/2024 Admitting Physician Harden Jude GAILS, MD PCP:[facility]  Brief Summary: Patient is a 83 y.o.  female with history of CKD 5-who was brought to the ED with shortness of breath/confusion/uncontrolled hypertension-she was found to have acute pulmonary edema in the setting of progression to ESRD.  She was initially admitted to the hospitalist service but became agitated-ripped off BiPAP-had uncontrolled hypertension-requiring initiation of nitroglycerin drip-and transferred to the ICU.  Per nephrology team-patient had not wanted dialysis as an outpatient-however patient was not able to make her own decisions due to encephalopathy-family elected to start hemodialysis.  She subsequently improved-and was transferred to [facility] on 11/7.  Significant events: 11/4>> admit to [facility]-but transferred to ICU due to agitation-nitroglycerin drip 11/5>> started on HD 11/7>> transferred to [facility].  Significant studies: 11/4>> chest x-ray: Findings compatible with interstitial edema 11/4>> echo: EF 45-50%,+ wall motion abnormality, moderately elevated pulmonary artery systolic pressure.  Severe MR, moderate TR  Significant microbiology data: 11/4>> COVID/influenza/RSV PCR: Negative  Procedures: 11/4>> temporary right groin dialysis catheter by PCCM.  Consults: Nephrology cardiology PCCM Palliative care  Subjective: Lying comfortably in bed-denies any chest pain or shortness of breath.  Relatively awake and alert-no complaints.  Objective: Vitals: Blood pressure (!) 141/72, pulse (!) 104, temperature 98.1 F (36.7 C), temperature source Oral, resp. rate 20, weight 50.2 kg, SpO2 92%.   Exam: Gen Exam:Alert awake-not in any distress HEENT:atraumatic, normocephalic Chest: B/L clear to auscultation anteriorly CVS:S1S2 regular Abdomen:soft  non tender, non distended Extremities:no edema Neurology: Non focal Skin: no rash  Pertinent Labs/Radiology:    Latest Ref Rng & Units 10/24/2024    3:42 AM 10/23/2024    3:24 AM 10/22/2024    6:02 AM  CBC  WBC 4.0 - 10.5 K/uL 5.8  7.9  9.3   Hemoglobin 12.0 - 15.0 g/dL 7.2  7.5  8.6   Hematocrit 36.0 - 46.0 % 22.0  22.9  27.3   Platelets 150 - 400 K/uL 70  61  62     Lab Results  Component Value Date   NA 136 10/24/2024   K 3.1 (L) 10/24/2024   CL 100 10/24/2024   CO2 21 (L) 10/24/2024      Assessment/Plan: Acute hypoxic respiratory failure secondary to acute pulmonary edema in the setting of CKD 5 with 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 Nephrology following-plans for HD again tomorrow  Acute metabolic encephalopathy Secondary to uremia Has essentially resolved with hemodialysis-now much more awake/alert.  Hypertensive crisis Required ICU transfer nitroglycerin infusion Blood pressure now much better after HD Continue amlodipine , metoprolol  Stop transdermal nitroglycerin patches and switch to Imdur instead Follow/optimize.  Elevated troponin Doubt type I non-STEMI Suspect related to demand ischemia/type II non-STEMI. Echo as above with mildly suppressed EF and some regional wall motion abnormality Cardiology ongoing-medically managed at this point.  Normocytic anemia Secondary to critical illness and anemia related to ESRD Defer Aranesp/iron  therapies to nephrology service  Thrombocytopenia Mild Suspect related to uremia Hopefully will improve with supportive care-continue to follow trend  Hypokalemia Replete/recheck.  DM-2  CBG stable SSI  Recent Labs    10/23/24 2150 10/24/24 0754 10/24/24 1138  GLUCAP 86 76 160*     History  of depression Apparently no longer taking Zoloft  or Remeron  per home MAR. Supportive care  Insomnia Trazodone   Code status:   Code Status: Limited: Do not attempt resuscitation (DNR) -DNR-LIMITED -Do Not Intubate/DNI    DVT Prophylaxis: Place and maintain sequential compression device Start: 10/22/24 0949 SCDs Start: 10/21/24 1948   Family Communication: Daughter at bedside   Disposition Plan: Status is: Inpatient Remains inpatient appropriate because: Severity of illness   Planned Discharge Destination:Home health versus SNF   Diet: Diet Order             Diet renal with fluid restriction Fluid restriction: 1500 mL Fluid; Room service appropriate? Yes; Fluid consistency: Thin  Diet effective now                     Antimicrobial agents: Anti-infectives (From admission, onward)    Start     Dose/Rate Route Frequency Ordered Stop   10/23/24 1100  doxycycline (VIBRA-TABS) tablet 100 mg        100 mg Oral Every 12 hours 10/23/24 1005 10/28/24 0959        MEDICATIONS: Scheduled Meds:  amLODipine   10 mg Oral Daily   aspirin EC  81 mg Oral Daily   atorvastatin   80 mg Oral Daily   calcitRIOL  0.25 mcg Oral Daily   Chlorhexidine  Gluconate Cloth  6 each Topical Q0600   cycloSPORINE  1 drop Both Eyes Daily   doxycycline  100 mg Oral Q12H   feeding supplement (NEPRO CARB STEADY)  237 mL Oral BID BM   ferrous sulfate   325 mg Oral Q breakfast   furosemide   80 mg Intravenous BID   hydrALAZINE   100 mg Oral BID   insulin  aspart  0-5 Units Subcutaneous QHS   insulin  aspart  0-9 Units Subcutaneous TID WC   metoprolol  succinate  25 mg Oral Daily   nitroGLYCERIN  0.5 inch Topical Q6H   pantoprazole   40 mg Oral Daily   sodium chloride  flush  3 mL Intravenous Q12H   Continuous Infusions:  iron  sucrose Stopped (10/23/24 1613)   PRN Meds:.acetaminophen  **OR** acetaminophen , albuterol , artificial tears, fentaNYL  (SUBLIMAZE ) injection, hydrALAZINE , labetalol ,  senna-docusate, traZODone    I have personally reviewed following labs and imaging studies  LABORATORY DATA: CBC: Recent Labs  Lab 10/21/24 0817 10/21/24 0823 10/21/24 1501 10/22/24 0602 10/23/24 0324 10/24/24 0342  WBC 6.6  --   --  9.3 7.9 5.8  NEUTROABS 5.4  --   --   --   --   --   HGB 9.4* 9.5* 9.2* 8.6* 7.5* 7.2*  HCT 30.0* 28.0* 27.0* 27.3* 22.9* 22.0*  MCV 98.7  --   --  98.9 95.4 94.4  PLT 120*  --   --  62* 61* 70*    Basic Metabolic Panel: Recent Labs  Lab 10/21/24 0817 10/21/24 0823 10/21/24 1501 10/22/24 0248 10/22/24 0249 10/23/24 0324 10/24/24 0342  NA 145   < > 146* 146* 147* 142 136  K 4.1   < > 4.1 4.2 4.2 3.7 3.1*  CL 115*  --   --  116* 117* 108 100  CO2 13*  --   --  10* 11* 17* 21*  GLUCOSE 169*  --   --  136* 136* 83 81  BUN 137*  --   --  145* 145* 108*  65*  CREATININE 10.15*  --   --  10.29* 10.34* 8.04* 5.65*  CALCIUM  8.3*  --   --  8.1* 8.1* 7.8* 7.4*  MG  --   --   --  1.8  --  1.7  --   PHOS  --   --   --  11.5* 11.5* 8.0* 4.9*   < > = values in this interval not displayed.    GFR: CrCl cannot be calculated (Unknown ideal weight.).  Liver Function Tests: Recent Labs  Lab 10/21/24 0817 10/22/24 0249 10/23/24 0324 10/24/24 0342  AST 40  --   --   --   ALT 24  --   --   --   ALKPHOS 45  --   --   --   BILITOT 0.9  --   --   --   PROT 6.6  --   --   --   ALBUMIN 3.3* 3.0* 2.5* 2.3*   No results for input(s): LIPASE, AMYLASE in the last 168 hours. No results for input(s): AMMONIA in the last 168 hours.  Coagulation Profile: No results for input(s): INR, PROTIME in the last 168 hours.  Cardiac Enzymes: No results for input(s): CKTOTAL, CKMB, CKMBINDEX, TROPONINI in the last 168 hours.  BNP (last 3 results) No results for input(s): PROBNP in the last 8760 hours.  Lipid Profile: No results for input(s): CHOL, HDL, LDLCALC, TRIG, CHOLHDL, LDLDIRECT in the last 72 hours.  Thyroid  Function  Tests: No results for input(s): TSH, T4TOTAL, FREET4, T3FREE, THYROIDAB in the last 72 hours.  Anemia Panel: Recent Labs    10/22/24 1336  FERRITIN 273  TIBC 140*  IRON  18*    Urine analysis:    Component Value Date/Time   COLORURINE YELLOW 01/26/2023 0815   APPEARANCEUR CLEAR 01/26/2023 0815   LABSPEC 1.011 01/26/2023 0815   PHURINE 5.0 01/26/2023 0815   GLUCOSEU NEGATIVE 01/26/2023 0815   HGBUR NEGATIVE 01/26/2023 0815   BILIRUBINUR NEGATIVE 01/26/2023 0815   KETONESUR NEGATIVE 01/26/2023 0815   PROTEINUR 100 (A) 01/26/2023 0815   UROBILINOGEN 0.2 01/30/2012 1215   NITRITE NEGATIVE 01/26/2023 0815   LEUKOCYTESUR NEGATIVE 01/26/2023 0815    Sepsis Labs: Lactic Acid, Venous    Component Value Date/Time   LATICACIDVEN 0.7 01/26/2023 0556    MICROBIOLOGY: Recent Results (from the past 240 hours)  Resp panel by RT-PCR (RSV, Flu A&B, Covid) Anterior Nasal Swab     Status: None   Collection Time: 10/21/24  8:06 AM   Specimen: Anterior Nasal Swab  Result Value Ref Range Status   SARS Coronavirus 2 by RT PCR NEGATIVE NEGATIVE Final   Influenza A by PCR NEGATIVE NEGATIVE Final   Influenza B by PCR NEGATIVE NEGATIVE Final    Comment: (NOTE) The Xpert Xpress SARS-CoV-2/FLU/RSV plus assay is intended as an aid in the diagnosis of influenza from Nasopharyngeal swab specimens and should not be used as a sole basis for treatment. Nasal washings and aspirates are unacceptable for Xpert Xpress SARS-CoV-2/FLU/RSV testing.  Fact Sheet for Patients: bloggercourse.com  Fact Sheet for Healthcare Providers: seriousbroker.it  This test is not yet approved or cleared by the [place]  FDA and has been authorized for detection and/or diagnosis of SARS-CoV-2 by FDA under an Emergency Use Authorization (EUA). This EUA will remain in effect (meaning this test can be used) for the duration of the COVID-19 declaration under  Section 564(b)(1) of the Act, 21 U.S.C. section 360bbb-3(b)(1), unless the authorization is terminated or revoked.  Resp Syncytial Virus by PCR NEGATIVE NEGATIVE Final    Comment: (NOTE) Fact Sheet for Patients: bloggercourse.com  Fact Sheet for Healthcare Providers: seriousbroker.it  This test is not yet approved or cleared by the [place]  FDA and has been authorized for detection and/or diagnosis of SARS-CoV-2 by FDA under an Emergency Use Authorization (EUA). This EUA will remain in effect (meaning this test can be used) for the duration of the COVID-19 declaration under Section 564(b)(1) of the Act, 21 U.S.C. section 360bbb-3(b)(1), unless the authorization is terminated or revoked.  Performed at [facility], [address], [place], [place] [address]   MRSA Next Gen by PCR, Nasal     Status: None   Collection Time: 10/21/24  7:47 PM   Specimen: Nasal Mucosa; Nasal Swab  Result Value Ref Range Status   MRSA by PCR Next Gen NOT DETECTED NOT DETECTED Final    Comment: (NOTE) The GeneXpert MRSA Assay (FDA approved for NASAL specimens only), is one component of a comprehensive MRSA colonization surveillance program. It is not intended to diagnose MRSA infection nor to guide or monitor treatment for MRSA infections. Test performance is not FDA approved in patients less than 50 years old. Performed at [facility], [address] [place], [place] [address]     RADIOLOGY STUDIES/RESULTS: DG CHEST PORT 1 VIEW Result Date: 10/22/2024 EXAM: 1 VIEW(S) XRAY OF THE CHEST 10/22/2024 01:30:00 PM COMPARISON: 10/22/2024 CLINICAL HISTORY: Respiratory failure (HCC) FINDINGS: LUNGS AND PLEURA: Mild pulmonary edema. Bilateral airspace opacities, right greater than left. No pleural effusion. No pneumothorax. HEART AND MEDIASTINUM: Unchanged cardiomegaly. Aortic atherosclerosis. No acute abnormality of the cardiac and  mediastinal silhouettes. BONES AND SOFT TISSUES: Scoliotic curvature of thoracolumbar spine. No acute osseous abnormality. IMPRESSION: 1. Mild pulmonary edema with bilateral airspace opacities, right greater than left. 2. Cardiomegaly, unchanged. Electronically signed by: Lonni Necessary MD 10/22/2024 05:30 PM EST RP Workstation: [id number]     LOS: 3 days   Donalda Applebaum, MD  [facility]    To contact the attending provider between 7A-7P or the covering provider during after hours 7P-7A, please log into the web site [email or web address] and access using universal [place] password for that web site. If you do not have the password, please call the hospital operator.  10/24/2024, 11:30 AM

## 2024-10-24 NOTE — Progress Notes (Signed)
   10/24/24 2200  BiPAP/CPAP/SIPAP  Reason BIPAP/CPAP not in use Non-compliant (Pt. refused. Educated pt on reasons to wear it. Pt. is non compliant at home as well.)

## 2024-10-24 NOTE — Progress Notes (Signed)
 Patient ID: Erica Whitney, female   DOB: May 16, 1941, 83 y.o.   MRN: [id number] S: Briefly more awake and alert this morning and engaging but not having substantial understanding of her circumstances. In a very superficial/generic way she states that she would continue dialysis if it is helping her Her historical declination of dialysis as an outpatient noted and I discussed this with Dr. Jerrye Family continues to think that dialysis is the best path for her.   Palliative notes reviewed.  O:BP (!) 153/73 (BP Location: Left Arm)   Pulse 88   Temp 98.6 F (37 C) (Oral)   Resp (!) 31   Wt 50.2 kg   SpO2 99%   BMI 19.60 kg/m   Intake/Output Summary (Last 24 hours) at 10/24/2024 1043 Last data filed at 10/24/2024 0500 Gross per 24 hour  Intake 80.66 ml  Output 2500 ml  Net -2419.34 ml   Intake/Output: I/O last 3 completed shifts: In: 83.7 [I.V.:3; IV Piggyback:80.7] Out: 2500 [Other:2500]  Intake/Output this shift:  No intake/output data recorded. Weight change: 0.2 kg Gen: cachectic, elderly female in NAD CVS: RRR Resp: scattered crackles bilaterally Abd: +BS, soft, NT/ND Ext: no edema  Recent Labs  Lab 10/21/24 0817 10/21/24 0823 10/21/24 1501 10/22/24 0248 10/22/24 0249 10/23/24 0324 10/24/24 0342  NA 145 146* 146* 146* 147* 142 136  K 4.1 4.1 4.1 4.2 4.2 3.7 3.1*  CL 115*  --   --  116* 117* 108 100  CO2 13*  --   --  10* 11* 17* 21*  GLUCOSE 169*  --   --  136* 136* 83 81  BUN 137*  --   --  145* 145* 108* 65*  CREATININE 10.15*  --   --  10.29* 10.34* 8.04* 5.65*  ALBUMIN 3.3*  --   --   --  3.0* 2.5* 2.3*  CALCIUM  8.3*  --   --  8.1* 8.1* 7.8* 7.4*  PHOS  --   --   --  11.5* 11.5* 8.0* 4.9*  AST 40  --   --   --   --   --   --   ALT 24  --   --   --   --   --   --    Liver Function Tests: Recent Labs  Lab 10/21/24 0817 10/22/24 0249 10/23/24 0324 10/24/24 0342  AST 40  --   --   --   ALT 24  --   --   --   ALKPHOS 45  --   --   --   BILITOT  0.9  --   --   --   PROT 6.6  --   --   --   ALBUMIN 3.3* 3.0* 2.5* 2.3*   No results for input(s): LIPASE, AMYLASE in the last 168 hours. No results for input(s): AMMONIA in the last 168 hours. CBC: Recent Labs  Lab 10/21/24 0817 10/21/24 0823 10/22/24 0602 10/23/24 0324 10/24/24 0342  WBC 6.6  --  9.3 7.9 5.8  NEUTROABS 5.4  --   --   --   --   HGB 9.4*   < > 8.6* 7.5* 7.2*  HCT 30.0*   < > 27.3* 22.9* 22.0*  MCV 98.7  --  98.9 95.4 94.4  PLT 120*  --  62* 61* 70*   < > = values in this interval not displayed.   Cardiac Enzymes: No results for input(s): CKTOTAL, CKMB, CKMBINDEX, TROPONINI in the last  168 hours. CBG: Recent Labs  Lab 10/23/24 1118 10/23/24 1603 10/23/24 1926 10/23/24 2150 10/24/24 0754  GLUCAP 129* 73 102* 86 76    Iron  Studies:  Recent Labs    10/22/24 1336  IRON  18*  TIBC 140*  FERRITIN 273   Studies/Results: DG CHEST PORT 1 VIEW Result Date: 10/22/2024 EXAM: 1 VIEW(S) XRAY OF THE CHEST 10/22/2024 01:30:00 PM COMPARISON: 10/22/2024 CLINICAL HISTORY: Respiratory failure (HCC) FINDINGS: LUNGS AND PLEURA: Mild pulmonary edema. Bilateral airspace opacities, right greater than left. No pleural effusion. No pneumothorax. HEART AND MEDIASTINUM: Unchanged cardiomegaly. Aortic atherosclerosis. No acute abnormality of the cardiac and mediastinal silhouettes. BONES AND SOFT TISSUES: Scoliotic curvature of thoracolumbar spine. No acute osseous abnormality. IMPRESSION: 1. Mild pulmonary edema with bilateral airspace opacities, right greater than left. 2. Cardiomegaly, unchanged. Electronically signed by: Lonni Necessary MD 10/22/2024 05:30 PM EST RP Workstation: [id number]    amLODipine   10 mg Oral Daily   aspirin EC  81 mg Oral Daily   atorvastatin   80 mg Oral Daily   calcitRIOL  0.25 mcg Oral Daily   Chlorhexidine  Gluconate Cloth  6 each Topical Q0600   cycloSPORINE  1 drop Both Eyes Daily   doxycycline  100 mg Oral Q12H   feeding  supplement (NEPRO CARB STEADY)  237 mL Oral BID BM   ferrous sulfate   325 mg Oral Q breakfast   furosemide   80 mg Intravenous BID   hydrALAZINE   100 mg Oral BID   insulin  aspart  0-5 Units Subcutaneous QHS   insulin  aspart  0-9 Units Subcutaneous TID WC   metoprolol  succinate  25 mg Oral Daily   nitroGLYCERIN  0.5 inch Topical Q6H   pantoprazole   40 mg Oral Daily   sodium chloride  flush  3 mL Intravenous Q12H    BMET    Component Value Date/Time   NA 136 10/24/2024 0342   NA 141 11/15/2017 0759   K 3.1 (L) 10/24/2024 0342   K 4.4 11/15/2017 0759   CL 100 10/24/2024 0342   CO2 21 (L) 10/24/2024 0342   CO2 26 11/15/2017 0759   GLUCOSE 81 10/24/2024 0342   GLUCOSE 176 (H) 11/15/2017 0759   BUN 65 (H) 10/24/2024 0342   BUN 29.8 (H) 11/15/2017 0759   CREATININE 5.65 (H) 10/24/2024 0342   CREATININE 4.38 (H) 02/27/2023 0918   CREATININE 1.1 11/15/2017 0759   CALCIUM  7.4 (L) 10/24/2024 0342   CALCIUM  10.6 (H) 11/15/2017 0759   GFRNONAA 7 (L) 10/24/2024 0342   GFRNONAA 11 (L) 02/15/2023 0946   GFRNONAA 17 (L) 01/27/2021 0950   GFRAA 20 (L) 01/27/2021 0950   CBC    Component Value Date/Time   WBC 5.8 10/24/2024 0342   RBC 2.33 (L) 10/24/2024 0342   HGB 7.2 (L) 10/24/2024 0342   HGB 9.6 (L) 02/15/2023 0946   HGB 10.8 (L) 11/15/2017 0759   HCT 22.0 (L) 10/24/2024 0342   HCT 33.1 (L) 11/15/2017 0759   PLT 70 (L) 10/24/2024 0342   PLT 253 02/15/2023 0946   PLT 196 11/15/2017 0759   MCV 94.4 10/24/2024 0342   MCV 90.8 11/15/2017 0759   MCH 30.9 10/24/2024 0342   MCHC 32.7 10/24/2024 0342   RDW 17.4 (H) 10/24/2024 0342   RDW 14.2 11/15/2017 0759   LYMPHSABS 0.4 (L) 10/21/2024 0817   LYMPHSABS 2.0 11/15/2017 0759   MONOABS 0.7 10/21/2024 0817   MONOABS 0.9 11/15/2017 0759   EOSABS 0.0 10/21/2024 0817   EOSABS 0.2 11/15/2017 0759  BASOSABS 0.0 10/21/2024 0817   BASOSABS 0.1 11/15/2017 0759      Assessment/Plan: **Uremic CKD 5, now ESRD: Baseline Cr 7's with BUN  >112.  As an outpt followed by Dr. Jerrye for >2 years she made it very clear that she didn't want dialysis after seeing her daughter and sister being on dialysis, already followed by palliative care as outpt.  Initiated dialysis during hospitalization at request of family and patient without capacity for medical decision making.  Looks like this continues at the current time.  Plan for dialysis tomorrow on second shift, 11/8.  Dr. Jerrye will be able to discuss with family again.  Patient at best has a superficial understanding of current circumstances.  Currently has a right femoral temporary catheter placed 11/4.  Has received HD on 11/5 and 11/6.  Based upon long-term plan possibly will need tunneled catheter and clip.      **Anemia:  receives ESA q2wks outpt and IV iron  PRN.  Hb 7.5.  given IV iron  and Aranesp.  Transfuse if Hgb continues to drop   **CHF    **HTN: Stable more recently   **DM: per primary  Bernardino KATHEE Gasman, MD  [facility]

## 2024-10-24 NOTE — Evaluation (Addendum)
 Occupational Therapy Evaluation Patient Details Name: Erica Whitney MRN: [id number] DOB: 13-Jun-1941 Today's Date: 10/24/2024   History of Present Illness   Pt is an 83 y.o. female who presented 10/21/24 with SOB, AMS, and R-sided pain. Pt admitted with acute hypoxic respiratory failure due to pulmonary edema, CKD V now ESRD, volume overload, uremic encephalopathy, and concern for pneumonia. PMH: anemia in CKD, arthritis, breast cancer, CKD stage IV, depression, DM, hypercholesteremia, HTN, insomnia, peptic ulcer, uterine cancer     Clinical Impressions Patient admitted for the diagnosis above.  PTA she lives at home alone, and her daughter checks in with her prior and after work.  Daughter is hoping the patient can transition home with increased PCA assist during the daytime.  If this is arranged, home with PCA assist and [facility] rehab is a good option.  OT will continue efforts in the acute setting to address deficits.  Daughter is willing to consider SNF if needed.  Other (comment) (SNF vs HH OT.  Daughter is trying to arrage increased assist at home for mother.  Daughter prefers home     If plan is discharge home, recommend the following:    Patient has needed DME.  Daughter trying to arrange 24 hour PCA services with [facility]     Functional Status Assessment         Equipment Recommendations   Other (comment) (SNF vs HH OT.  Daughter is trying to arrage increased assist at home for mother.  Daughter prefers home)     Recommendations for Other Services         Precautions/Restrictions   Precautions Precautions: Fall Recall of Precautions/Restrictions: Impaired Restrictions Weight Bearing Restrictions Per Provider Order: No     Mobility Bed Mobility Overal bed mobility: Needs Assistance Bed Mobility: Supine to Sit, Sit to Supine     Supine to sit: Contact guard, HOB elevated Sit to supine: Contact guard assist, Min assist, HOB elevated         Transfers Overall transfer level: Needs assistance Equipment used: Rolling walker (2 wheels) Transfers: Sit to/from Stand Sit to Stand: Min assist, From elevated surface                  Balance Overall balance assessment: Needs assistance Sitting-balance support: No upper extremity supported, Feet supported Sitting balance-Leahy Scale: Good     Standing balance support: Reliant on assistive device for balance Standing balance-Leahy Scale: Fair                             ADL either performed or assessed with clinical judgement   ADL Overall ADL's : Needs assistance/impaired Eating/Feeding: Set up;Sitting   Grooming: Wash/dry hands;Wash/dry face;Supervision/safety;Sitting   Upper Body Bathing: Contact guard assist;Minimal assistance;Sitting;Cueing for safety   Lower Body Bathing: Minimal assistance;Sit to/from stand;Cueing for safety   Upper Body Dressing : Minimal assistance;Sitting   Lower Body Dressing: Minimal assistance;Sit to/from stand;Cueing for sequencing;Cueing for safety;Cueing for compensatory techniques   Toilet Transfer: Minimal assistance;Rolling walker (2 wheels);Regular Toilet;Ambulation                   Vision Patient Visual Report: No change from baseline       Perception Perception: Not tested       Praxis Praxis: Not tested       Pertinent Vitals/Pain Pain Assessment Pain Assessment: Faces Faces Pain Scale: No hurt Pain Intervention(s): Monitored during session  Extremity/Trunk Assessment Upper Extremity Assessment Upper Extremity Assessment: Generalized weakness   Lower Extremity Assessment Lower Extremity Assessment: Defer to PT evaluation   Cervical / Trunk Assessment Cervical / Trunk Assessment: Kyphotic   Communication Communication Communication: No apparent difficulties Factors Affecting Communication: Hearing impaired   Cognition Arousal: Alert Behavior During Therapy: WFL for tasks  assessed/performed Cognition: History of cognitive impairments             OT - Cognition Comments: Memory deficits at baseline                 Following commands: Impaired Following commands impaired: Follows one step commands with increased time     Cueing  General Comments   Cueing Techniques: Verbal cues;Gestural cues  VSS; educated daughter on pt's risk for falls and current recs for 24/7 care at d/c, she verbalized understanding   Exercises     Shoulder Instructions      Home Living Family/patient expects to be discharged to:: Private residence Living Arrangements: Alone Available Help at Discharge: Family;Available PRN/intermittently Type of Home: Apartment Home Access: Level entry     Home Layout: One level     Bathroom Shower/Tub: Tub/shower unit;Sponge bathes at baseline   Bathroom Toilet: Standard Bathroom Accessibility: Yes   Home Equipment: Rollator (4 wheels);Tub bench;Rolling Walker (2 wheels)          Prior Functioning/Environment Prior Level of Function : Independent/Modified Independent;History of Falls (last six months);Patient poor historian/Family not available             Mobility Comments: uses rollator outside, holds onto furniture inside; hx of falls but pt unable to recall how many ADLs Comments: Daughter assists in cooking at times and with med management; bathes and dresses herself; daughter drives her to appointments.  Sponge bathes.    OT Problem List: Decreased strength;Decreased activity tolerance;Impaired balance (sitting and/or standing);Decreased safety awareness;Decreased cognition   OT Treatment/Interventions: Self-care/ADL training;Therapeutic activities;DME and/or AE instruction;Balance training;Patient/family education      OT Goals(Current goals can be found in the care plan section)   Acute Rehab OT Goals Patient Stated Goal: Return home with increased support OT Goal Formulation: With patient Time  For Goal Achievement: 11/07/24 Potential to Achieve Goals: Fair ADL Goals Pt Will Perform Grooming: with set-up;standing Pt Will Perform Upper Body Dressing: with set-up;sitting Pt Will Perform Lower Body Dressing: sit to/from stand;with contact guard assist Pt Will Transfer to Toilet: with supervision;ambulating;regular height toilet   OT Frequency:  Min 2X/week    Co-evaluation              AM-PAC OT 6 Clicks Daily Activity     Outcome Measure Help from another person eating meals?: None Help from another person taking care of personal grooming?: A Little Help from another person toileting, which includes using toliet, bedpan, or urinal?: A Little Help from another person bathing (including washing, rinsing, drying)?: A Little Help from another person to put on and taking off regular upper body clothing?: A Little Help from another person to put on and taking off regular lower body clothing?: A Little 6 Click Score: 19   End of Session Equipment Utilized During Treatment: Rolling walker (2 wheels);Gait belt Nurse Communication: Mobility status  Activity Tolerance: Patient tolerated treatment well Patient left: in bed;with call bell/phone within reach;with family/visitor present;with bed alarm set  OT Visit Diagnosis: Unsteadiness on feet (R26.81);Muscle weakness (generalized) (M62.81);Other symptoms and signs involving cognitive function  Time: 9041-8984 OT Time Calculation (min): 17 min Charges:  OT General Charges $OT Visit: 1 Visit OT Evaluation $OT Eval Moderate Complexity: 1 Mod  10/24/2024  RP, OTR/L  Acute Rehabilitation Services  Office:  [phone]   Charlie JONETTA Halsted 10/24/2024, 10:46 AM

## 2024-10-24 NOTE — Plan of Care (Signed)
  Problem: Education: Goal: Ability to demonstrate management of disease process will improve Outcome: Progressing Goal: Ability to verbalize understanding of medication therapies will improve Outcome: Progressing   Problem: Activity: Goal: Capacity to carry out activities will improve Outcome: Progressing   Problem: Cardiac: Goal: Ability to achieve and maintain adequate cardiopulmonary perfusion will improve Outcome: Progressing   Problem: Education: Goal: Knowledge of General Education information will improve Description: Including pain rating scale, medication(s)/side effects and non-pharmacologic comfort measures Outcome: Progressing   Problem: Health Behavior/Discharge Planning: Goal: Ability to manage health-related needs will improve Outcome: Progressing   Problem: Clinical Measurements: Goal: Ability to maintain clinical measurements within normal limits will improve Outcome: Progressing Goal: Will remain free from infection Outcome: Progressing Goal: Diagnostic test results will improve Outcome: Progressing Goal: Respiratory complications will improve Outcome: Progressing Goal: Cardiovascular complication will be avoided Outcome: Progressing   Problem: Activity: Goal: Risk for activity intolerance will decrease Outcome: Progressing   Problem: Nutrition: Goal: Adequate nutrition will be maintained Outcome: Progressing   Problem: Coping: Goal: Level of anxiety will decrease Outcome: Progressing   Problem: Elimination: Goal: Will not experience complications related to bowel motility Outcome: Progressing Goal: Will not experience complications related to urinary retention Outcome: Progressing   Problem: Pain Managment: Goal: General experience of comfort will improve and/or be controlled Outcome: Progressing   Problem: Safety: Goal: Ability to remain free from injury will improve Outcome: Progressing   Problem: Skin Integrity: Goal: Risk for  impaired skin integrity will decrease Outcome: Progressing

## 2024-10-24 NOTE — Progress Notes (Signed)
 This chaplain responded to PMT NP-Joseph consult for creating the Pt. Advance Directive. The Pt. is eating lunch at the time of the visit. The Pt. daughter-Tawana and son-Brian are at the bedside.   The chaplain attempted AD education. Darrick remembers PMT sharing incomplete documents for the Pt. and family to review. The paperwork is in Tawana's car. At the time of the visit, the Pt. is unable to recall the meaning of HCPOA. With assistance from the chaplain, the Pt. is able to state Darrick is her surrogate healthcare decision maker. The Pt. son-Brian agrees with the Pt. statement. The chaplain provided education on [place] Hierarchy of Decision Makers, if the Pt. is unable to complete the HCPOA. Monday was discussed as an appropriate day for F/U.  Chaplain Leeroy Hummer [phone]

## 2024-10-24 NOTE — Progress Notes (Signed)
 Daily Progress Note   Patient Name: Erica Whitney       Date: 10/24/2024 DOB: [birth year]  Age: 83 y.o. MRN#: [id number] Attending Physician: Raenelle Donalda HERO, MD Primary Care Physician: Pa, [facility] Admit Date: 10/21/2024  Reason for Consultation/Follow-up: Establishing goals of care   Length of Stay: 3  Current Medications: Scheduled Meds:   amLODipine   10 mg Oral Daily   aspirin EC  81 mg Oral Daily   atorvastatin   80 mg Oral Daily   calcitRIOL  0.25 mcg Oral Daily   Chlorhexidine  Gluconate Cloth  6 each Topical Q0600   cycloSPORINE  1 drop Both Eyes Daily   doxycycline  100 mg Oral Q12H   feeding supplement (NEPRO CARB STEADY)  237 mL Oral BID BM   ferrous sulfate   325 mg Oral Q breakfast   furosemide   80 mg Intravenous BID   hydrALAZINE   100 mg Oral BID   insulin  aspart  0-5 Units Subcutaneous QHS   insulin  aspart  0-9 Units Subcutaneous TID WC   metoprolol  succinate  25 mg Oral Daily   nitroGLYCERIN  0.5 inch Topical Q6H   pantoprazole   40 mg Oral Daily   sodium chloride  flush  3 mL Intravenous Q12H    Continuous Infusions:  iron  sucrose Stopped (10/23/24 1613)    PRN Meds: acetaminophen  **OR** acetaminophen , albuterol , artificial tears, fentaNYL  (SUBLIMAZE ) injection, hydrALAZINE , labetalol , senna-docusate, traZODone   Physical Exam Vitals reviewed.  Constitutional:      General: She is not in acute distress. HENT:     Head: Normocephalic and atraumatic.  Cardiovascular:     Rate and Rhythm: Normal rate.  Pulmonary:     Effort: Pulmonary effort is normal.  Skin:    General: Skin is warm and dry.  Neurological:     Mental Status: She is alert and oriented to person, place, and time.  Psychiatric:        Cognition and Memory: Memory is impaired.              Vital Signs: BP (!) 153/73 (BP Location: Left Arm)   Pulse 88   Temp 98.6 F (37 C) (Oral)   Resp (!) 31   Wt 50.2 kg   SpO2 99%   BMI 19.60 kg/m  SpO2: SpO2: 99 % O2 Device: O2 Device: Room  Air O2 Flow Rate: O2 Flow Rate (L/min): 2 L/min    Patient Active Problem List   Diagnosis Date Noted   H/O fluid overload 10/21/2024   Acute respiratory failure with hypoxia (HCC) 10/21/2024   Hypertensive urgency 10/21/2024   Heart failure with reduced ejection fraction (HFrEF) (HCC) 10/21/2024   Elevated troponin 10/21/2024   Metabolic acidosis with increased anion gap and accumulation of organic acids 10/21/2024   Thrombocytopenia 10/21/2024   Depression 10/21/2024   Insomnia 10/21/2024   History of breast cancer 10/21/2024   Renal failure 10/21/2024   Anemia of chronic renal failure, stage 5 (HCC) 08/24/2024   Cellulitis 01/26/2023   Acute metabolic encephalopathy 01/26/2023   Acute kidney injury superimposed on stage 4 chronic kidney disease (HCC) 01/26/2023   CKD (chronic kidney disease) stage 4, GFR 15-29 ml/min (HCC) 01/26/2023   Anemia in chronic kidney disease (CKD) 04/18/2022   Duodenal ulcer with hemorrhage    Acute blood loss anemia 05/25/2021   Rectal bleeding 05/14/2021   Bright red blood per rectum 05/13/2021   HTN (hypertension) 05/13/2021   PUD (peptic ulcer disease) 05/13/2021   Controlled type 2 diabetes mellitus without complication, without long-term current use of insulin  (HCC) 05/13/2021   Osteopenia 01/13/2015   Uterine cancer (HCC)    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    Palliative Care Assessment & Plan   Patient Profile: 83 y.o. 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  Family reports patient was in usual state of health in the last couple of days and started exhibiting shortness of breath and altered mental status acutely in the last 24 hours. Of note patient has previously stated she does not want dialysis due to her daughter being a dialysis patient and family confirms that has been her wishes.    Palliative medicine consulted for goals of care discussion.   Today's Discussion: Patient sitting up in bed eating breakfast.  Daughter is at bedside. Daughter shared the patient's mental status continues to improve. Patient is alert, oriented, and able to participate in light conversation. Patient was able to participate with PT today.   When we began discussing the patient's ESRD and GOC she repeatedly changed the subject to her grandson who has Crohn's disease. The patient understands she has had dialysis while in the hospital and tells me several people in her family have needed dialysis. She shared that her mother and sister both had cancer-- when I asked about her cancer history she shared she had not had cancer (daughter confirmed she has had both breast and uterine cancer). The patient is unable to have meaningful discussion around goals of care today.  Daughter shared the family would like to continue with dialysis trial. Discussed the importance of continued conversation with family and the medical providers regarding overall plan of care and treatment options, ensuring decisions are within the context of the patient's values and GOCs.  Encouraged family to call PMT with questions or concerns. PMT will continue to support.  Recommendations/Plan: DNR/DNI Awaiting improvement in mentation for further GOC discussion Family wants to continue dialysis trial  Continued PMT support    Code Status:    Code Status Orders  (From admission, onward)           Start  Ordered   10/21/24 1949  Do not attempt resuscitation (DNR)- Limited -Do Not Intubate  (DNI)  Continuous       Question Answer Comment  If pulseless and not breathing No CPR or chest compressions.   In Pre-Arrest Conditions (Patient Is Breathing and Has A Pulse) Do not intubate. Provide all appropriate non-invasive medical interventions. Avoid ICU transfer unless indicated or required.   Consent: Discussion documented in EHR or advanced directives reviewed      10/21/24 1950            Extensive chart review has been completed prior to seeing the patient including labs, vital signs, imaging, progress/consult notes, orders, medications, and available advance directive documents.  Care plan was discussed with Dr. Raenelle  Time spent: 65 minutes  Thank you for allowing the Palliative Medicine Team to assist in the care of this patient.    Stephane CHRISTELLA Palin, NP  Please contact Palliative Medicine Team phone at [phone] for questions and concerns.

## 2024-10-24 NOTE — Progress Notes (Signed)
 Patient arrived to 5W30 transferred from ICU at 0100.  Report received.   Daughter at bedside, plan of care update provided, daughter to depart and return later in the day.   Patient awake, alert, oriented x3, able to follow commands. Denies pain. VS obtained.   CHG bath, lines checked, skin check.

## 2024-10-24 NOTE — Progress Notes (Signed)
 Subjective:  More alert and awake denies any chest pain or shortness of breath complains of coughing has low-grade temp  Objective:  Vital Signs in the last 24 hours: Temp:  [98 F (36.7 C)-100.1 F (37.8 C)] 98.1 F (36.7 C) (11/07 1100) Pulse Rate:  [57-104] 104 (11/07 1100) Resp:  [14-31] 20 (11/07 1100) BP: (107-159)/(61-80) 141/72 (11/07 1100) SpO2:  [92 %-100 %] 92 % (11/07 1100) Weight:  [50.2 kg] 50.2 kg (11/07 0500)  Intake/Output from previous day: 11/06 0701 - 11/07 0700 In: 80.7 [IV Piggyback:80.7] Out: 2500  Intake/Output from this shift: No intake/output data recorded.  Physical Exam: Neck: no adenopathy, no carotid bruit, no JVD, and supple, symmetrical, trachea midline Lungs: Bilateral rhonchi noted Heart: regular rate and rhythm, S1, S2 normal, and 2/6 systolic murmur noted Abdomen: soft, non-tender; bowel sounds normal; no masses,  no organomegaly Extremities: extremities normal, atraumatic, no cyanosis or edema  Lab Results: Recent Labs    10/23/24 0324 10/24/24 0342  WBC 7.9 5.8  HGB 7.5* 7.2*  PLT 61* 70*   Recent Labs    10/23/24 0324 10/24/24 0342  NA 142 136  K 3.7 3.1*  CL 108 100  CO2 17* 21*  GLUCOSE 83 81  BUN 108* 65*  CREATININE 8.04* 5.65*   No results for input(s): TROPONINI in the last 72 hours.  Invalid input(s): CK, MB Hepatic Function Panel Recent Labs    10/24/24 0342  ALBUMIN 2.3*   No results for input(s): CHOL in the last 72 hours. No results for input(s): PROTIME in the last 72 hours.  Imaging: Imaging results have been reviewed and DG CHEST PORT 1 VIEW Result Date: 10/22/2024 EXAM: 1 VIEW(S) XRAY OF THE CHEST 10/22/2024 01:30:00 PM COMPARISON: 10/22/2024 CLINICAL HISTORY: Respiratory failure (HCC) FINDINGS: LUNGS AND PLEURA: Mild pulmonary edema. Bilateral airspace opacities, right greater than left. No pleural effusion. No pneumothorax. HEART AND MEDIASTINUM: Unchanged cardiomegaly. Aortic  atherosclerosis. No acute abnormality of the cardiac and mediastinal silhouettes. BONES AND SOFT TISSUES: Scoliotic curvature of thoracolumbar spine. No acute osseous abnormality. IMPRESSION: 1. Mild pulmonary edema with bilateral airspace opacities, right greater than left. 2. Cardiomegaly, unchanged. Electronically signed by: Lonni Necessary MD 10/22/2024 05:30 PM EST RP Workstation: [id number]    Cardiac Studies:  Assessment/Plan:  Status post hypertensive emergency complicated by acute pulmonary edema secondary to noncompliance to medication Minimally elevated troponin I secondary to above doubt significant MI Status post acute hypoxic respiratory failure secondary to above rule out pneumonia Acute on chronic kidney disease now started on hemodialysis Metabolic acidosis with encephalopathy improved Hypertension Diabetes mellitus Hyperlipidemia Anemia of chronic disease History of uterine 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

## 2024-10-25 DIAGNOSIS — Z7189 Other specified counseling: Secondary | ICD-10-CM | POA: Diagnosis not present

## 2024-10-25 DIAGNOSIS — N179 Acute kidney failure, unspecified: Secondary | ICD-10-CM | POA: Diagnosis not present

## 2024-10-25 DIAGNOSIS — Z515 Encounter for palliative care: Secondary | ICD-10-CM | POA: Diagnosis not present

## 2024-10-25 DIAGNOSIS — N184 Chronic kidney disease, stage 4 (severe): Secondary | ICD-10-CM | POA: Diagnosis not present

## 2024-10-25 LAB — RENAL FUNCTION PANEL
Albumin: 2.2 g/dL — ABNORMAL LOW (ref 3.5–5.0)
Anion gap: 15 (ref 5–15)
BUN: 81 mg/dL — ABNORMAL HIGH (ref 8–23)
CO2: 20 mmol/L — ABNORMAL LOW (ref 22–32)
Calcium: 7.6 mg/dL — ABNORMAL LOW (ref 8.9–10.3)
Chloride: 100 mmol/L (ref 98–111)
Creatinine, Ser: 6.56 mg/dL — ABNORMAL HIGH (ref 0.44–1.00)
GFR, Estimated: 6 mL/min — ABNORMAL LOW (ref >60)
Glucose, Bld: 116 mg/dL — ABNORMAL HIGH (ref 70–99)
Phosphorus: 5.3 mg/dL — ABNORMAL HIGH (ref 2.5–4.6)
Potassium: 3.2 mmol/L — ABNORMAL LOW (ref 3.5–5.1)
Sodium: 135 mmol/L (ref 135–145)

## 2024-10-25 LAB — CBC
HCT: 21.8 % — ABNORMAL LOW (ref 36.0–46.0)
Hemoglobin: 7.1 g/dL — ABNORMAL LOW (ref 12.0–15.0)
MCH: 31.1 pg (ref 26.0–34.0)
MCHC: 32.6 g/dL (ref 30.0–36.0)
MCV: 95.6 fL (ref 80.0–100.0)
Platelets: 88 K/uL — ABNORMAL LOW (ref 150–400)
RBC: 2.28 MIL/uL — ABNORMAL LOW (ref 3.87–5.11)
RDW: 17 % — ABNORMAL HIGH (ref 11.5–15.5)
WBC: 5.5 K/uL (ref 4.0–10.5)
nRBC: 0 % (ref 0.0–0.2)

## 2024-10-25 LAB — GLUCOSE, CAPILLARY
Glucose-Capillary: 112 mg/dL — ABNORMAL HIGH (ref 70–99)
Glucose-Capillary: 94 mg/dL (ref 70–99)
Glucose-Capillary: 177 mg/dL — ABNORMAL HIGH (ref 70–99)
Glucose-Capillary: 119 mg/dL — ABNORMAL HIGH (ref 70–99)

## 2024-10-25 MED ORDER — HEPARIN SODIUM (PORCINE) 1000 UNIT/ML IJ SOLN
2800.0000 [IU] | Freq: Once | INTRAMUSCULAR | Status: AC
  Administered 2024-10-25: 2800 [IU]

## 2024-10-25 MED ORDER — POTASSIUM CHLORIDE CRYS ER 20 MEQ PO TBCR
20.0000 meq | EXTENDED_RELEASE_TABLET | Freq: Once | ORAL | Status: AC
  Administered 2024-10-25: 20 meq via ORAL
  Filled 2024-10-25: qty 1

## 2024-10-25 MED ORDER — HEPARIN SODIUM (PORCINE) 1000 UNIT/ML IJ SOLN
INTRAMUSCULAR | Status: AC
  Filled 2024-10-25: qty 3

## 2024-10-25 NOTE — Progress Notes (Signed)
 Patient ID: Erica Whitney, female   DOB: [birth year], 83 y.o.   MRN: [id number]   Subjective:  Seen and examined on dialysis.  Procedure supervised.  Right femoral catheter in use.  Blood pressure 141/77 and HR .  Tolerating treatment.  Her children elected to start her on dialysis this week - she had previously expressed preferences for conservative management and had been seen by palliative care outpatient.    Review of systems:  Denies shortness of breath or chest pain  Denies n/v Slow to answer questions    O:BP (!) 141/77   Pulse 67   Temp 98.1 F (36.7 C) (Oral)   Resp (!) 22   Ht 5' 3 (1.6 m)   Wt 49.6 kg   SpO2 100%   BMI 19.37 kg/m  No intake or output data in the 24 hours ending 10/25/24 1135  Intake/Output: I/O last 3 completed shifts: In: 23.8 [IV Piggyback:23.8] Out: 0   Intake/Output this shift:  No intake/output data recorded. Weight change: -3 kg  General thin elderly female in bed in no acute distress HEENT normocephalic atraumatic extraocular movements intact sclera anicteric Neck supple trachea midline Lungs clear to auscultation bilaterally normal work of breathing at rest  Heart S1S2 no rub Abdomen soft nontender nondistended Extremities no edema  Psych no anxiety or agitation  Neuro alert and oriented to person, year, and location though slow to answer questions Access Right femoral nontunneled catheter   Recent Labs  Lab 10/21/24 0817 10/21/24 0823 10/21/24 1501 10/22/24 0248 10/22/24 0249 10/23/24 0324 10/24/24 0342 10/25/24 0421  NA 145 146* 146* 146* 147* 142 136 135  K 4.1 4.1 4.1 4.2 4.2 3.7 3.1* 3.2*  CL 115*  --   --  116* 117* 108 100 100  CO2 13*  --   --  10* 11* 17* 21* 20*  GLUCOSE 169*  --   --  136* 136* 83 81 116*  BUN 137*  --   --  145* 145* 108* 65* 81*  CREATININE 10.15*  --   --  10.29* 10.34* 8.04* 5.65* 6.56*  ALBUMIN 3.3*  --   --   --  3.0* 2.5* 2.3* 2.2*  CALCIUM  8.3*  --   --  8.1* 8.1* 7.8* 7.4* 7.6*   PHOS  --   --   --  11.5* 11.5* 8.0* 4.9* 5.3*  AST 40  --   --   --   --   --   --   --   ALT 24  --   --   --   --   --   --   --    Liver Function Tests: Recent Labs  Lab 10/21/24 0817 10/22/24 0249 10/23/24 0324 10/24/24 0342 10/25/24 0421  AST 40  --   --   --   --   ALT 24  --   --   --   --   ALKPHOS 45  --   --   --   --   BILITOT 0.9  --   --   --   --   PROT 6.6  --   --   --   --   ALBUMIN 3.3*   < > 2.5* 2.3* 2.2*   < > = values in this interval not displayed.   No results for input(s): LIPASE, AMYLASE in the last 168 hours. No results for input(s): AMMONIA in the last 168 hours. CBC: Recent Labs  Lab 10/21/24  9182 10/21/24 0823 10/22/24 0602 10/23/24 0324 10/24/24 0342 10/25/24 0421  WBC 6.6  --  9.3 7.9 5.8 5.5  NEUTROABS 5.4  --   --   --   --   --   HGB 9.4*   < > 8.6* 7.5* 7.2* 7.1*  HCT 30.0*   < > 27.3* 22.9* 22.0* 21.8*  MCV 98.7  --  98.9 95.4 94.4 95.6  PLT 120*  --  62* 61* 70* 88*   < > = values in this interval not displayed.   Cardiac Enzymes: No results for input(s): CKTOTAL, CKMB, CKMBINDEX, TROPONINI in the last 168 hours. CBG: Recent Labs  Lab 10/24/24 0754 10/24/24 1138 10/24/24 1614 10/24/24 2117 10/25/24 0812  GLUCAP 76 160* 118* 128* 112*    Iron  Studies:  Recent Labs    10/22/24 1336  IRON  18*  TIBC 140*  FERRITIN 273   Studies/Results: No results found.   amLODipine   10 mg Oral Daily   aspirin EC  81 mg Oral Daily   atorvastatin   80 mg Oral Daily   calcitRIOL  0.25 mcg Oral Daily   Chlorhexidine  Gluconate Cloth  6 each Topical Q0600   cycloSPORINE  1 drop Both Eyes Daily   doxycycline  100 mg Oral Q12H   feeding supplement (NEPRO CARB STEADY)  237 mL Oral BID BM   ferrous sulfate   325 mg Oral Q breakfast   furosemide   80 mg Intravenous BID   insulin  aspart  0-5 Units Subcutaneous QHS   insulin  aspart  0-9 Units Subcutaneous TID WC   isosorbide-hydrALAZINE   2 tablet Oral TID   metoprolol   succinate  25 mg Oral Daily   pantoprazole   40 mg Oral Daily   potassium chloride   20 mEq Oral Once   sodium chloride  flush  3 mL Intravenous Q12H    BMET    Component Value Date/Time   NA 135 10/25/2024 0421   NA 141 11/15/2017 0759   K 3.2 (L) 10/25/2024 0421   K 4.4 11/15/2017 0759   CL 100 10/25/2024 0421   CO2 20 (L) 10/25/2024 0421   CO2 26 11/15/2017 0759   GLUCOSE 116 (H) 10/25/2024 0421   GLUCOSE 176 (H) 11/15/2017 0759   BUN 81 (H) 10/25/2024 0421   BUN 29.8 (H) 11/15/2017 0759   CREATININE 6.56 (H) 10/25/2024 0421   CREATININE 4.38 (H) 02/27/2023 0918   CREATININE 1.1 11/15/2017 0759   CALCIUM  7.6 (L) 10/25/2024 0421   CALCIUM  10.6 (H) 11/15/2017 0759   GFRNONAA 6 (L) 10/25/2024 0421   GFRNONAA 11 (L) 02/15/2023 0946   GFRNONAA 17 (L) 01/27/2021 0950   GFRAA 20 (L) 01/27/2021 0950   CBC    Component Value Date/Time   WBC 5.5 10/25/2024 0421   RBC 2.28 (L) 10/25/2024 0421   HGB 7.1 (L) 10/25/2024 0421   HGB 9.6 (L) 02/15/2023 0946   HGB 10.8 (L) 11/15/2017 0759   HCT 21.8 (L) 10/25/2024 0421   HCT 33.1 (L) 11/15/2017 0759   PLT 88 (L) 10/25/2024 0421   PLT 253 02/15/2023 0946   PLT 196 11/15/2017 0759   MCV 95.6 10/25/2024 0421   MCV 90.8 11/15/2017 0759   MCH 31.1 10/25/2024 0421   MCHC 32.6 10/25/2024 0421   RDW 17.0 (H) 10/25/2024 0421   RDW 14.2 11/15/2017 0759   LYMPHSABS 0.4 (L) 10/21/2024 0817   LYMPHSABS 2.0 11/15/2017 0759   MONOABS 0.7 10/21/2024 0817   MONOABS 0.9 11/15/2017 0759   EOSABS 0.0  10/21/2024 0817   EOSABS 0.2 11/15/2017 0759   BASOSABS 0.0 10/21/2024 0817   BASOSABS 0.1 11/15/2017 0759      Assessment/Plan: **[place] CKD 5, now ESRD: Baseline Cr 7's with BUN >112.  As an outpt followed by Dr. Jerrye for >2 years; she had consistently expressed her wishes to not do dialysis after seeing her daughter and sister go through it.  Already followed by palliative care as outpt.  Right femoral temporary catheter placed 11/4.  She was  initiated on dialysis during hospitalization at request of family and patient without capacity for medical decision making. - Got HD today as previously ordered - Will discuss with her family and goal for patient to be able to participate; she is slow to answer questions and was uremic on presentation.   - Appreciate palliative care and all teams involved in the care of the patient     **Anemia of CKD:  usually receives ESA q2wks outpt and IV iron  PRN.  Appears she got retacrit  on 10/08/24 - Transfuse if Hgb continues to drop   **CHF - optimize volume status with HD for now; on room air   **HTN: improved   **DM: per primary team   Erica JAYSON Jerrye, MD  [facility] 11:48 AM 10/25/2024

## 2024-10-25 NOTE — Progress Notes (Signed)
 Pt. Came in on bed, assisted by porter. Pt. Awake and responsive. Verbally gave Consent and on file.  TX duration: 2.5 hour UF removal:  Access used: Right femoral Catheter Access issue:Old blood present on the exit site.  Cleaned and Changed dressing. Dressing change due: 11/01/2024  Pt. Completed and tolerated tx well. Catheter locked and dwelled with heparin. Endorsed to Floor nurse. Transported pt. To room   [employer] B. Kawanna Christley, RN [facility]

## 2024-10-25 NOTE — Progress Notes (Signed)
 Subjective:  Seen in hemodialysis department, tolerating hemodialysis.  No complaints.  No active cardiac issues now blood pressure better controlled  Objective:  Vital Signs in the last 24 hours: Temp:  [97.6 F (36.4 C)-98.2 F (36.8 C)] 98.1 F (36.7 C) (11/08 0826) Pulse Rate:  [61-104] 61 (11/08 1030) Resp:  [16-23] 20 (11/08 1030) BP: (116-165)/(54-120) 163/84 (11/08 1030) SpO2:  [92 %-100 %] 100 % (11/08 1030) Weight:  [49.6 kg-50.1 kg] 49.6 kg (11/08 0826)  Intake/Output from previous day: No intake/output data recorded. Intake/Output from this shift: No intake/output data recorded.  Physical Exam: Exam unchanged  Lab Results: Recent Labs    10/24/24 0342 10/25/24 0421  WBC 5.8 5.5  HGB 7.2* 7.1*  PLT 70* 88*   Recent Labs    10/24/24 0342 10/25/24 0421  NA 136 135  K 3.1* 3.2*  CL 100 100  CO2 21* 20*  GLUCOSE 81 116*  BUN 65* 81*  CREATININE 5.65* 6.56*   No results for input(s): TROPONINI in the last 72 hours.  Invalid input(s): CK, MB Hepatic Function Panel Recent Labs    10/25/24 0421  ALBUMIN 2.2*   No results for input(s): CHOL in the last 72 hours. No results for input(s): PROTIME in the last 72 hours.  Imaging: Imaging results have been reviewed and No results found.  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  LOS: 4 days    Levern Hutching 10/25/2024, 10:52 AM

## 2024-10-25 NOTE — Plan of Care (Signed)
  Problem: Education: Goal: Ability to demonstrate management of disease process will improve Outcome: Progressing Goal: Ability to verbalize understanding of medication therapies will improve Outcome: Progressing   Problem: Activity: Goal: Capacity to carry out activities will improve Outcome: Progressing   Problem: Cardiac: Goal: Ability to achieve and maintain adequate cardiopulmonary perfusion will improve Outcome: Progressing   Problem: Education: Goal: Knowledge of General Education information will improve Description: Including pain rating scale, medication(s)/side effects and non-pharmacologic comfort measures Outcome: Progressing   Problem: Health Behavior/Discharge Planning: Goal: Ability to manage health-related needs will improve Outcome: Progressing   Problem: Clinical Measurements: Goal: Ability to maintain clinical measurements within normal limits will improve Outcome: Progressing Goal: Will remain free from infection Outcome: Progressing Goal: Diagnostic test results will improve Outcome: Progressing Goal: Respiratory complications will improve Outcome: Progressing Goal: Cardiovascular complication will be avoided Outcome: Progressing   Problem: Activity: Goal: Risk for activity intolerance will decrease Outcome: Progressing   Problem: Nutrition: Goal: Adequate nutrition will be maintained Outcome: Progressing   Problem: Coping: Goal: Level of anxiety will decrease Outcome: Progressing   Problem: Elimination: Goal: Will not experience complications related to bowel motility Outcome: Progressing Goal: Will not experience complications related to urinary retention Outcome: Progressing   Problem: Pain Managment: Goal: General experience of comfort will improve and/or be controlled Outcome: Progressing   Problem: Safety: Goal: Ability to remain free from injury will improve Outcome: Progressing   Problem: Skin Integrity: Goal: Risk for  impaired skin integrity will decrease Outcome: Progressing

## 2024-10-25 NOTE — Plan of Care (Signed)
  Problem: Education: Goal: Ability to demonstrate management of disease process will improve 10/25/2024 0715 by Elnor Zachary CROME, RN Outcome: Progressing 10/25/2024 0715 by Elnor Zachary CROME, RN Outcome: Progressing Goal: Ability to verbalize understanding of medication therapies will improve 10/25/2024 0715 by Elnor Zachary CROME, RN Outcome: Progressing 10/25/2024 0715 by Elnor Zachary CROME, RN Outcome: Progressing   Problem: Activity: Goal: Capacity to carry out activities will improve 10/25/2024 0715 by Elnor Zachary CROME, RN Outcome: Progressing 10/25/2024 0715 by Elnor Zachary CROME, RN Outcome: Progressing   Problem: Cardiac: Goal: Ability to achieve and maintain adequate cardiopulmonary perfusion will improve 10/25/2024 0715 by Elnor Zachary CROME, RN Outcome: Progressing 10/25/2024 0715 by Elnor Zachary CROME, RN Outcome: Progressing   Problem: Education: Goal: Knowledge of General Education information will improve Description: Including pain rating scale, medication(s)/side effects and non-pharmacologic comfort measures 10/25/2024 0715 by Elnor Zachary CROME, RN Outcome: Progressing 10/25/2024 0715 by Elnor Zachary CROME, RN Outcome: Progressing   Problem: Health Behavior/Discharge Planning: Goal: Ability to manage health-related needs will improve 10/25/2024 0715 by Elnor Zachary CROME, RN Outcome: Progressing 10/25/2024 0715 by Elnor Zachary CROME, RN Outcome: Progressing   Problem: Clinical Measurements: Goal: Ability to maintain clinical measurements within normal limits will improve 10/25/2024 0715 by Elnor Zachary CROME, RN Outcome: Progressing 10/25/2024 0715 by Elnor Zachary CROME, RN Outcome: Progressing Goal: Will remain free from infection 10/25/2024 0715 by Elnor Zachary CROME, RN Outcome: Progressing 10/25/2024 0715 by Elnor Zachary CROME, RN Outcome: Progressing Goal: Diagnostic test results will improve 10/25/2024 0715 by Elnor Zachary CROME, RN Outcome: Progressing 10/25/2024 0715 by Elnor Zachary CROME, RN Outcome:  Progressing Goal: Respiratory complications will improve 10/25/2024 0715 by Elnor Zachary CROME, RN Outcome: Progressing 10/25/2024 0715 by Elnor Zachary CROME, RN Outcome: Progressing Goal: Cardiovascular complication will be avoided 10/25/2024 0715 by Elnor Zachary CROME, RN Outcome: Progressing 10/25/2024 0715 by Elnor Zachary CROME, RN Outcome: Progressing   Problem: Activity: Goal: Risk for activity intolerance will decrease 10/25/2024 0715 by Elnor Zachary CROME, RN Outcome: Progressing 10/25/2024 0715 by Elnor Zachary CROME, RN Outcome: Progressing   Problem: Nutrition: Goal: Adequate nutrition will be maintained 10/25/2024 0715 by Elnor Zachary CROME, RN Outcome: Progressing 10/25/2024 0715 by Elnor Zachary CROME, RN Outcome: Progressing   Problem: Coping: Goal: Level of anxiety will decrease 10/25/2024 0715 by Elnor Zachary CROME, RN Outcome: Progressing 10/25/2024 0715 by Elnor Zachary CROME, RN Outcome: Progressing   Problem: Elimination: Goal: Will not experience complications related to bowel motility 10/25/2024 0715 by Elnor Zachary CROME, RN Outcome: Progressing 10/25/2024 0715 by Elnor Zachary CROME, RN Outcome: Progressing Goal: Will not experience complications related to urinary retention 10/25/2024 0715 by Elnor Zachary CROME, RN Outcome: Progressing 10/25/2024 0715 by Elnor Zachary CROME, RN Outcome: Progressing   Problem: Pain Managment: Goal: General experience of comfort will improve and/or be controlled 10/25/2024 0715 by Elnor Zachary CROME, RN Outcome: Progressing 10/25/2024 0715 by Elnor Zachary CROME, RN Outcome: Progressing   Problem: Safety: Goal: Ability to remain free from injury will improve 10/25/2024 0715 by Elnor Zachary CROME, RN Outcome: Progressing 10/25/2024 0715 by Elnor Zachary CROME, RN Outcome: Progressing   Problem: Skin Integrity: Goal: Risk for impaired skin integrity will decrease 10/25/2024 0715 by Elnor Zachary CROME, RN Outcome: Progressing 10/25/2024 0715 by Elnor Zachary CROME, RN Outcome: Progressing

## 2024-10-25 NOTE — Progress Notes (Signed)
 PROGRESS NOTE        PATIENT DETAILS Name: Erica Whitney Age: 83 y.o. Sex: female Date of Birth: [birth year] Admit Date: 10/21/2024 Admitting Physician Harden Jude GAILS, MD PCP:[facility]  Brief Summary: Patient is a 83 y.o.  female with history of CKD 5-who was brought to the ED with shortness of breath/confusion/uncontrolled hypertension-she was found to have acute pulmonary edema in the setting of progression to ESRD.  She was initially admitted to the hospitalist service but became agitated-ripped off BiPAP-had uncontrolled hypertension-requiring initiation of nitroglycerin drip-and transferred to the ICU.  Per nephrology team-patient had not wanted dialysis as an outpatient-however patient was not able to make her own decisions due to encephalopathy-family elected to start hemodialysis.  She subsequently improved-and was transferred to [facility] on 11/7.  Significant events: 11/4>> admit to [facility]-but transferred to ICU due to agitation-nitroglycerin drip 11/5>> started on HD 11/7>> transferred to [facility].  Significant studies: 11/4>> chest x-ray: Findings compatible with interstitial edema 11/4>> echo: EF 45-50%,+ wall motion abnormality, moderately elevated pulmonary artery systolic pressure.  Severe MR, moderate TR  Significant microbiology data: 11/4>> COVID/influenza/RSV PCR: Negative  Procedures: 11/4>> temporary right groin dialysis catheter by PCCM.  Consults: Nephrology cardiology PCCM Palliative care  Subjective: Seen earlier this morning at hemodialysis-no major issues overnight-appears slightly confused but easily redirectable.  Objective: Vitals: Blood pressure (!) 141/68, pulse 70, temperature 98.1 F (36.7 C), temperature source Oral, resp. rate 12, height 5' 3 (1.6 m), weight 49.6 kg, SpO2 98%.   Exam: Gen Exam:Alert awake-not in any distress HEENT:atraumatic, normocephalic Chest: B/L clear to auscultation anteriorly CVS:S1S2  regular Abdomen:soft non tender, non distended Extremities:no edema Neurology: Non focal Skin: no rash  Pertinent Labs/Radiology:    Latest Ref Rng & Units 10/25/2024    4:21 AM 10/24/2024    3:42 AM 10/23/2024    3:24 AM  CBC  WBC 4.0 - 10.5 K/uL 5.5  5.8  7.9   Hemoglobin 12.0 - 15.0 g/dL 7.1  7.2  7.5   Hematocrit 36.0 - 46.0 % 21.8  22.0  22.9   Platelets 150 - 400 K/uL 88  70  61     Lab Results  Component Value Date   NA 135 10/25/2024   K 3.2 (L) 10/25/2024   CL 100 10/25/2024   CO2 20 (L) 10/25/2024      Assessment/Plan: Acute hypoxic respiratory failure secondary to acute pulmonary edema in the setting of CKD 5 with 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  Currently although improved-still somewhat confused-family still wanting her to continue trial of dialysis-palliative care/nephrology following.  Acute metabolic encephalopathy Secondary to uremia Although encephalopathy markedly better-still intermittently/pleasantly confused.  Hypertensive crisis Required ICU transfer nitroglycerin infusion Blood pressure now much better after HD Continue amlodipine , metoprolol , BiDil  Elevated troponin Doubt type I non-STEMI Suspect related to demand ischemia/type II non-STEMI. Echo as above with mildly suppressed EF and some regional wall motion abnormality Cardiology ongoing-medically managed at this point.  Normocytic anemia Secondary to critical illness and anemia related to ESRD Defer Aranesp/iron  therapies to nephrology service  Thrombocytopenia Mild Suspect related to uremia Hopefully will improve with supportive care-continue to follow trend  Hypokalemia Continue to replete/recheck.  DM-2  CBG stable SSI  Recent Labs    10/24/24 1614  10/24/24 2117 10/25/24 0812  GLUCAP 118* 128* 112*     History of depression Apparently no longer taking Zoloft  or Remeron  per home MAR. Supportive care  Insomnia Trazodone   Code status:   Code Status: Limited: Do not attempt resuscitation (DNR) -DNR-LIMITED -Do Not Intubate/DNI    DVT Prophylaxis: Place and maintain sequential compression device Start: 10/22/24 0949 SCDs Start: 10/21/24 1948   Family Communication: Daughter at bedside 11/7-none at bedside this morning   Disposition Plan: Status is: Inpatient Remains inpatient appropriate because: Severity of illness   Planned Discharge Destination:Home health versus SNF   Diet: Diet Order             Diet renal with fluid restriction Fluid restriction: 1500 mL Fluid; Room service appropriate? Yes; Fluid consistency: Thin  Diet effective now                     Antimicrobial agents: Anti-infectives (From admission, onward)    Start     Dose/Rate Route Frequency Ordered Stop   10/23/24 1100  doxycycline (VIBRA-TABS) tablet 100 mg        100 mg Oral Every 12 hours 10/23/24 1005 10/28/24 0959        MEDICATIONS: Scheduled Meds:  amLODipine   10 mg Oral Daily   aspirin EC  81 mg Oral Daily   atorvastatin   80 mg Oral Daily   calcitRIOL  0.25 mcg Oral Daily   Chlorhexidine  Gluconate Cloth  6 each Topical Q0600   cycloSPORINE  1 drop Both Eyes Daily   doxycycline  100 mg Oral Q12H   feeding supplement (NEPRO CARB STEADY)  237 mL Oral BID BM   ferrous sulfate   325 mg Oral Q breakfast   furosemide   80 mg Intravenous BID   insulin  aspart  0-5 Units Subcutaneous QHS   insulin  aspart  0-9 Units Subcutaneous TID WC   isosorbide-hydrALAZINE   2 tablet Oral TID   metoprolol  succinate  25 mg Oral Daily   pantoprazole   40 mg Oral Daily   potassium chloride   20 mEq Oral Once   sodium chloride  flush  3 mL Intravenous Q12H   Continuous Infusions:   PRN Meds:.acetaminophen  **OR** acetaminophen , albuterol ,  artificial tears, fentaNYL  (SUBLIMAZE ) injection, labetalol , senna-docusate, traZODone    I have personally reviewed following labs and imaging studies  LABORATORY DATA: CBC: Recent Labs  Lab 10/21/24 0817 10/21/24 0823 10/21/24 1501 10/22/24 0602 10/23/24 0324 10/24/24 0342 10/25/24 0421  WBC 6.6  --   --  9.3 7.9 5.8 5.5  NEUTROABS 5.4  --   --   --   --   --   --   HGB 9.4*   < > 9.2* 8.6* 7.5* 7.2* 7.1*  HCT 30.0*   < > 27.0* 27.3* 22.9* 22.0* 21.8*  MCV 98.7  --   --  98.9 95.4 94.4 95.6  PLT 120*  --   --  62* 61* 70* 88*   < > = values in this interval not displayed.    Basic Metabolic Panel: Recent Labs  Lab 10/22/24 0248 10/22/24 0249 10/23/24 0324 10/24/24 0342 10/25/24 0421  NA 146* 147* 142 136 135  K 4.2 4.2 3.7 3.1* 3.2*  CL 116* 117* 108 100 100  CO2 10* 11* 17* 21* 20*  GLUCOSE 136* 136* 83 81 116*  BUN 145* 145* 108* 65* 81*  CREATININE 10.29* 10.34* 8.04* 5.65* 6.56*  CALCIUM   8.1* 8.1* 7.8* 7.4* 7.6*  MG 1.8  --  1.7  --   --   PHOS 11.5* 11.5* 8.0* 4.9* 5.3*    GFR: Estimated Creatinine Clearance: 5.3 mL/min (A) (by C-G formula based on SCr of 6.56 mg/dL (H)).  Liver Function Tests: Recent Labs  Lab 10/21/24 0817 10/22/24 0249 10/23/24 0324 10/24/24 0342 10/25/24 0421  AST 40  --   --   --   --   ALT 24  --   --   --   --   ALKPHOS 45  --   --   --   --   BILITOT 0.9  --   --   --   --   PROT 6.6  --   --   --   --   ALBUMIN 3.3* 3.0* 2.5* 2.3* 2.2*   No results for input(s): LIPASE, AMYLASE in the last 168 hours. No results for input(s): AMMONIA in the last 168 hours.  Coagulation Profile: No results for input(s): INR, PROTIME in the last 168 hours.  Cardiac Enzymes: No results for input(s): CKTOTAL, CKMB, CKMBINDEX, TROPONINI in the last 168 hours.  BNP (last 3 results) No results for input(s): PROBNP in the last 8760 hours.  Lipid Profile: No results for input(s): CHOL, HDL, LDLCALC, TRIG,  CHOLHDL, LDLDIRECT in the last 72 hours.  Thyroid  Function Tests: No results for input(s): TSH, T4TOTAL, FREET4, T3FREE, THYROIDAB in the last 72 hours.  Anemia Panel: Recent Labs    10/22/24 1336  FERRITIN 273  TIBC 140*  IRON  18*    Urine analysis:    Component Value Date/Time   COLORURINE YELLOW 01/26/2023 0815   APPEARANCEUR CLEAR 01/26/2023 0815   LABSPEC 1.011 01/26/2023 0815   PHURINE 5.0 01/26/2023 0815   GLUCOSEU NEGATIVE 01/26/2023 0815   HGBUR NEGATIVE 01/26/2023 0815   BILIRUBINUR NEGATIVE 01/26/2023 0815   KETONESUR NEGATIVE 01/26/2023 0815   PROTEINUR 100 (A) 01/26/2023 0815   UROBILINOGEN 0.2 01/30/2012 1215   NITRITE NEGATIVE 01/26/2023 0815   LEUKOCYTESUR NEGATIVE 01/26/2023 0815    Sepsis Labs: Lactic Acid, Venous    Component Value Date/Time   LATICACIDVEN 0.7 01/26/2023 0556    MICROBIOLOGY: Recent Results (from the past 240 hours)  Resp panel by RT-PCR (RSV, Flu A&B, Covid) Anterior Nasal Swab     Status: None   Collection Time: 10/21/24  8:06 AM   Specimen: Anterior Nasal Swab  Result Value Ref Range Status   SARS Coronavirus 2 by RT PCR NEGATIVE NEGATIVE Final   Influenza A by PCR NEGATIVE NEGATIVE Final   Influenza B by PCR NEGATIVE NEGATIVE Final    Comment: (NOTE) The Xpert Xpress SARS-CoV-2/FLU/RSV plus assay is intended as an aid in the diagnosis of influenza from Nasopharyngeal swab specimens and should not be used as a sole basis for treatment. Nasal washings and aspirates are unacceptable for Xpert Xpress SARS-CoV-2/FLU/RSV testing.  Fact Sheet for Patients: bloggercourse.com  Fact Sheet for Healthcare Providers: seriousbroker.it  This test is not yet approved or cleared by the [place]  FDA and has been authorized for detection and/or diagnosis of SARS-CoV-2 by FDA under an Emergency Use Authorization (EUA). This EUA will remain in effect (meaning this test  can be used) for the duration of the COVID-19 declaration under Section 564(b)(1) of the Act, 21 U.S.C. section 360bbb-3(b)(1), unless the authorization is terminated or revoked.     Resp Syncytial Virus by PCR NEGATIVE NEGATIVE Final    Comment: (NOTE) Fact Sheet for  Patients: bloggercourse.com  Fact Sheet for Healthcare Providers: seriousbroker.it  This test is not yet approved or cleared by the [place]  FDA and has been authorized for detection and/or diagnosis of SARS-CoV-2 by FDA under an Emergency Use Authorization (EUA). This EUA will remain in effect (meaning this test can be used) for the duration of the COVID-19 declaration under Section 564(b)(1) of the Act, 21 U.S.C. section 360bbb-3(b)(1), unless the authorization is terminated or revoked.  Performed at [facility], [address], [place], [place] [address]   MRSA Next Gen by PCR, Nasal     Status: None   Collection Time: 10/21/24  7:47 PM   Specimen: Nasal Mucosa; Nasal Swab  Result Value Ref Range Status   MRSA by PCR Next Gen NOT DETECTED NOT DETECTED Final    Comment: (NOTE) The GeneXpert MRSA Assay (FDA approved for NASAL specimens only), is one component of a comprehensive MRSA colonization surveillance program. It is not intended to diagnose MRSA infection nor to guide or monitor treatment for MRSA infections. Test performance is not FDA approved in patients less than 43 years old. Performed at [facility], [address], [place], [place] [address]     RADIOLOGY STUDIES/RESULTS: No results found.    LOS: 4 days   Donalda Applebaum, MD  [facility]    To contact the attending provider between 7A-7P or the covering provider during after hours 7P-7A, please log into the web site [email or web address] and access using universal [place] password for that web site. If you do not have the password, please call the hospital  operator.  10/25/2024, 11:16 AM

## 2024-10-25 NOTE — Progress Notes (Signed)
   10/25/24 1137  Vitals  Temp 98.1 F (36.7 C)  Pulse Rate 60  Resp 16  BP (!) 182/107  SpO2 100 %  O2 Device Room Air  Oxygen Therapy  Patient Activity (if Appropriate) In bed  Pulse Oximetry Type Continuous  Oximetry Probe Site Changed No  Post Treatment  Dialyzer Clearance Lightly streaked  Hemodialysis Intake (mL) 0 mL  Liters Processed 44.9  Fluid Removed (mL) 1000 mL  Tolerated HD Treatment Yes

## 2024-10-25 NOTE — Progress Notes (Signed)
 Daily Progress Note   Patient Name: Erica Whitney       Date: 10/25/2024 DOB: 1941/09/03  Age: 83 y.o. MRN#: [id number] Attending Physician: Erica Donalda HERO, MD Primary Care Physician: Erica Whitney Admit Date: 10/21/2024  Reason for Consultation/Follow-up: Establishing goals of care   Length of Stay: 4  Current Medications: Scheduled Meds:   amLODipine   10 mg Oral Daily   aspirin EC  81 mg Oral Daily   atorvastatin   80 mg Oral Daily   calcitRIOL  0.25 mcg Oral Daily   Chlorhexidine  Gluconate Cloth  6 each Topical Q0600   cycloSPORINE  1 drop Both Eyes Daily   doxycycline  100 mg Oral Q12H   feeding supplement (NEPRO CARB STEADY)  237 mL Oral BID BM   ferrous sulfate   325 mg Oral Q breakfast   furosemide   80 mg Intravenous BID   insulin  aspart  0-5 Units Subcutaneous QHS   insulin  aspart  0-9 Units Subcutaneous TID WC   isosorbide-hydrALAZINE   2 tablet Oral TID   metoprolol  succinate  25 mg Oral Daily   pantoprazole   40 mg Oral Daily   potassium chloride   20 mEq Oral Once   sodium chloride  flush  3 mL Intravenous Q12H    Continuous Infusions:    PRN Meds: acetaminophen  **OR** acetaminophen , albuterol , artificial tears, fentaNYL  (SUBLIMAZE ) injection, labetalol , senna-docusate, traZODone   Physical Exam Vitals reviewed.  Constitutional:      General: She is sleeping. She is not in acute distress. HENT:     Head: Normocephalic and atraumatic.  Cardiovascular:     Rate and Rhythm: Normal rate.  Pulmonary:     Effort: Pulmonary effort is normal.  Skin:    General: Skin is warm and dry.             Vital Signs: BP (!) 147/76   Pulse 81   Temp 98.1 F (36.7 C)   Resp (!) 22   Ht 5' 3 (1.6 m)   Wt 49.6 kg   SpO2 100%   BMI 19.37 kg/m  SpO2: SpO2:  100 % O2 Device: O2 Device: Room Air O2 Flow Rate: O2 Flow Rate (L/min): 2 L/min    Patient Active Problem List   Diagnosis Date Noted   H/O fluid overload 10/21/2024   Acute respiratory failure with  hypoxia (HCC) 10/21/2024   Hypertensive urgency 10/21/2024   Heart failure with reduced ejection fraction (HFrEF) (HCC) 10/21/2024   Elevated troponin 10/21/2024   Metabolic acidosis with increased anion gap and accumulation of organic acids 10/21/2024   Thrombocytopenia 10/21/2024   Depression 10/21/2024   Insomnia 10/21/2024   History of breast cancer 10/21/2024   Renal failure 10/21/2024   Anemia of chronic renal failure, stage 5 (HCC) 08/24/2024   Cellulitis 01/26/2023   Acute metabolic encephalopathy 01/26/2023   Acute kidney injury superimposed on stage 4 chronic kidney disease (HCC) 01/26/2023   CKD (chronic kidney disease) stage 4, GFR 15-29 ml/min (HCC) 01/26/2023   Anemia in chronic kidney disease (CKD) 04/18/2022   Duodenal ulcer with hemorrhage    Acute blood loss anemia 05/25/2021   Rectal bleeding 05/14/2021   Bright red blood per rectum 05/13/2021   HTN (hypertension) 05/13/2021   PUD (peptic ulcer disease) 05/13/2021   Controlled type 2 diabetes mellitus without complication, without long-term current use of insulin  (HCC) 05/13/2021   Osteopenia 01/13/2015   Uterine cancer (HCC)    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    Palliative Care Assessment & Plan   Patient Profile: 83 y.o. 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 Family reports patient was in usual state of health in the last couple of days and started exhibiting shortness of breath and altered mental status acutely in the last 24 hours. Of note  patient has previously stated she does not want dialysis due to her daughter being a dialysis patient and family confirms that has been her wishes.    Palliative medicine consulted for goals of care discussion.   Today's Discussion: Reviewed chart. Plan is for patient to have dialysis today. Patient lying in bed sleeping. She appears comfortable and is in no apparent distress. She does not easily awaken when I enter the room. No family at bedside.   08:55 Spoke with patient's daughter Erica Whitney by phone. Tawanna shared the patients mental status waxes and wanes through the day. We discussed that we would like to continue goals of care discussions with the patient at a time when she can fully understand her end stage renal disease, the treatment proposed (dialysis), the risks of treatment, and the risks of other treatment (including no treatment). In the past she has stated she would not want dialysis. At this time Erica Whitney wants to continue with the dialysis trial.  Discussed the importance of continued conversation with family and the medical providers regarding overall plan of care and treatment options, ensuring decisions are within the context of the patient's values and GOCs.  Encouraged family to call PMT with questions or concerns. PMT will continue to support.  Recommendations/Plan: DNR/DNI Awaiting improvement in mentation for further GOC discussion Family wants to continue dialysis trial  Continued PMT support    Code Status:    Code Status Orders  (From admission, onward)           Start     Ordered   10/21/24 1949  Do not attempt resuscitation (DNR)- Limited -Do Not Intubate (DNI)  Continuous       Question Answer Comment  If pulseless and not breathing No CPR or chest compressions.   In Pre-Arrest Conditions (Patient Is Breathing and Has  A Pulse) Do not intubate. Provide all appropriate non-invasive medical interventions. Avoid ICU transfer unless indicated or required.    Consent: Discussion documented in EHR or advanced directives reviewed      10/21/24 1950            Extensive chart review has been completed prior to seeing the patient including labs, vital signs, imaging, progress/consult notes, orders, medications, and available advance directive documents.   Time spent: 35 minutes  Thank you for allowing the Palliative Medicine Team to assist in the care of this patient.    Erica CHRISTELLA Palin, NP  Please contact Palliative Medicine Team phone at [phone] for questions and concerns.

## 2024-10-26 DIAGNOSIS — N179 Acute kidney failure, unspecified: Secondary | ICD-10-CM | POA: Diagnosis not present

## 2024-10-26 DIAGNOSIS — Z515 Encounter for palliative care: Secondary | ICD-10-CM | POA: Diagnosis not present

## 2024-10-26 DIAGNOSIS — N184 Chronic kidney disease, stage 4 (severe): Secondary | ICD-10-CM | POA: Diagnosis not present

## 2024-10-26 DIAGNOSIS — Z7189 Other specified counseling: Secondary | ICD-10-CM | POA: Diagnosis not present

## 2024-10-26 LAB — RENAL FUNCTION PANEL
Albumin: 2.2 g/dL — ABNORMAL LOW (ref 3.5–5.0)
Anion gap: 9 (ref 5–15)
BUN: 43 mg/dL — ABNORMAL HIGH (ref 8–23)
CO2: 25 mmol/L (ref 22–32)
Calcium: 7.7 mg/dL — ABNORMAL LOW (ref 8.9–10.3)
Chloride: 99 mmol/L (ref 98–111)
Creatinine, Ser: 4.43 mg/dL — ABNORMAL HIGH (ref 0.44–1.00)
GFR, Estimated: 9 mL/min — ABNORMAL LOW (ref >60)
Glucose, Bld: 114 mg/dL — ABNORMAL HIGH (ref 70–99)
Phosphorus: 3.7 mg/dL (ref 2.5–4.6)
Potassium: 3.6 mmol/L (ref 3.5–5.1)
Sodium: 133 mmol/L — ABNORMAL LOW (ref 135–145)

## 2024-10-26 LAB — GLUCOSE, CAPILLARY
Glucose-Capillary: 105 mg/dL — ABNORMAL HIGH (ref 70–99)
Glucose-Capillary: 158 mg/dL — ABNORMAL HIGH (ref 70–99)
Glucose-Capillary: 171 mg/dL — ABNORMAL HIGH (ref 70–99)
Glucose-Capillary: 86 mg/dL (ref 70–99)

## 2024-10-26 MED ORDER — DARBEPOETIN ALFA 100 MCG/0.5ML IJ SOSY
100.0000 ug | PREFILLED_SYRINGE | INTRAMUSCULAR | Status: DC
  Administered 2024-10-31: 100 ug via SUBCUTANEOUS
  Filled 2024-10-26: qty 0.5

## 2024-10-26 MED ORDER — ALTEPLASE 2 MG IJ SOLR
2.0000 mg | Freq: Once | INTRAMUSCULAR | Status: AC
  Administered 2024-10-26: 2 mg
  Filled 2024-10-26: qty 2

## 2024-10-26 MED ORDER — CHLORHEXIDINE GLUCONATE CLOTH 2 % EX PADS: 6.0000 | MEDICATED_PAD | Freq: Every day | CUTANEOUS | Status: DC

## 2024-10-26 NOTE — Progress Notes (Signed)
 PT Cancellation Note  Patient Details Name: Erica Whitney MRN: [id number] DOB: [birth year]   Cancelled Treatment:    Reason Eval/Treat Not Completed: Other (comment) pt nauseated this afternoon, started ~20 min prior to PT arrival and declining mobility. Given emesis bags and cool washcloth, RN notified. Will continue to follow and progress as able.   Izetta Call, PT, DPT   Acute Rehabilitation Department Office [phone] Secure Chat Communication Preferred    Izetta JULIANNA Call 10/26/2024, 2:29 PM

## 2024-10-26 NOTE — Progress Notes (Signed)
 PROGRESS NOTE        PATIENT DETAILS Name: Erica Whitney Age: 83 y.o. Sex: female Date of Birth: 1941/04/12 Admit Date: 10/21/2024 Admitting Physician Harden Jude GAILS, MD PCP:[facility]  Brief Summary: Patient is a 83 y.o.  female with history of CKD 5-who was brought to the ED with shortness of breath/confusion/uncontrolled hypertension-she was found to have acute pulmonary edema in the setting of progression to ESRD.  She was initially admitted to the hospitalist service but became agitated-ripped off BiPAP-had uncontrolled hypertension-requiring initiation of nitroglycerin drip-and transferred to the ICU.  Per nephrology team-patient had not wanted dialysis as an outpatient-however patient was not able to make her own decisions due to encephalopathy-family elected to start hemodialysis.  She subsequently improved-and was transferred to [facility] on 11/7.  Significant events: 11/4>> admit to [facility]-but transferred to ICU due to agitation-nitroglycerin drip 11/5>> started on HD 11/7>> transferred to [facility].  Significant studies: 11/4>> chest x-ray: Findings compatible with interstitial edema 11/4>> echo: EF 45-50%,+ wall motion abnormality, moderately elevated pulmonary artery systolic pressure.  Severe MR, moderate TR  Significant microbiology data: 11/4>> COVID/influenza/RSV PCR: Negative  Procedures: 11/4>> temporary right groin dialysis catheter by PCCM.  Consults: Nephrology cardiology PCCM Palliative care  Subjective: Slow to respond-just woke up but answers all my questions appropriately.  Daughter at bedside.  Objective: Vitals: Blood pressure (!) 143/57, pulse 80, temperature 98.4 F (36.9 C), temperature source Oral, resp. rate 16, height 5' 3 (1.6 m), weight 48.6 kg, SpO2 100%.   Exam: Gen Exam:Alert awake-not in any distress HEENT:atraumatic, normocephalic Chest: B/L clear to auscultation anteriorly CVS:S1S2 regular Abdomen:soft  non tender, non distended Extremities:no edema Neurology: Non focal Skin: no rash  Pertinent Labs/Radiology:    Latest Ref Rng & Units 10/25/2024    4:21 AM 10/24/2024    3:42 AM 10/23/2024    3:24 AM  CBC  WBC 4.0 - 10.5 K/uL 5.5  5.8  7.9   Hemoglobin 12.0 - 15.0 g/dL 7.1  7.2  7.5   Hematocrit 36.0 - 46.0 % 21.8  22.0  22.9   Platelets 150 - 400 K/uL 88  70  61     Lab Results  Component Value Date   NA 133 (L) 10/26/2024   K 3.6 10/26/2024   CL 99 10/26/2024   CO2 25 10/26/2024      Assessment/Plan: Acute hypoxic respiratory failure secondary to acute pulmonary edema in the setting of CKD 5 with 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  Nephrology/palliative care engaging family/patient-regarding whether or not it is feasible to do long-term dialysis.  Will await further recommendations.  Acute metabolic encephalopathy Secondary to uremia Although encephalopathy markedly better-slow to respond but does respond appropriately Delirium precautions.  Hypertensive crisis Required ICU transfer nitroglycerin infusion Blood pressure now much better after HD Continue amlodipine , metoprolol , BiDil  Elevated troponin Doubt type I non-STEMI Suspect related to demand ischemia/type II non-STEMI. Echo as above with mildly suppressed EF and some regional wall motion abnormality Cardiology ongoing-medically managed at this point.  Normocytic anemia Secondary to critical illness and anemia related to ESRD Defer Aranesp/iron  therapies to nephrology service  Thrombocytopenia Mild Suspect related to uremia Hopefully will improve with  supportive care-continue to follow trend  Hypokalemia Repleted-recheck periodically  DM-2  CBG stable SSI  Recent Labs     10/25/24 1600 10/25/24 2115 10/26/24 0749  GLUCAP 177* 119* 105*     History of depression Apparently no longer taking Zoloft  or Remeron  per home MAR. Supportive care  Insomnia Trazodone   Code status:   Code Status: Limited: Do not attempt resuscitation (DNR) -DNR-LIMITED -Do Not Intubate/DNI    DVT Prophylaxis: Place and maintain sequential compression device Start: 10/22/24 0949 SCDs Start: 10/21/24 1948   Family Communication: Daughter at bedside 11/9   Disposition Plan: Status is: Inpatient Remains inpatient appropriate because: Severity of illness   Planned Discharge Destination:Home health versus SNF   Diet: Diet Order             Diet renal with fluid restriction Fluid restriction: 1500 mL Fluid; Room service appropriate? Yes; Fluid consistency: Thin  Diet effective now                     Antimicrobial agents: Anti-infectives (From admission, onward)    Start     Dose/Rate Route Frequency Ordered Stop   10/23/24 1100  doxycycline (VIBRA-TABS) tablet 100 mg        100 mg Oral Every 12 hours 10/23/24 1005 10/28/24 0959        MEDICATIONS: Scheduled Meds:  amLODipine   10 mg Oral Daily   aspirin EC  81 mg Oral Daily   atorvastatin   80 mg Oral Daily   calcitRIOL  0.25 mcg Oral Daily   Chlorhexidine  Gluconate Cloth  6 each Topical Q0600   cycloSPORINE  1 drop Both Eyes Daily   doxycycline  100 mg Oral Q12H   feeding supplement (NEPRO CARB STEADY)  237 mL Oral BID BM   ferrous sulfate   325 mg Oral Q breakfast   furosemide   80 mg Intravenous BID   insulin  aspart  0-5 Units Subcutaneous QHS   insulin  aspart  0-9 Units Subcutaneous TID WC   isosorbide-hydrALAZINE   2 tablet Oral TID   metoprolol  succinate  25 mg Oral Daily   pantoprazole   40 mg Oral Daily   sodium chloride  flush  3 mL Intravenous Q12H   Continuous Infusions:   PRN Meds:.acetaminophen  **OR** acetaminophen , albuterol , artificial tears, fentaNYL  (SUBLIMAZE ) injection, labetalol ,  senna-docusate, traZODone    I have personally reviewed following labs and imaging studies  LABORATORY DATA: CBC: Recent Labs  Lab 10/21/24 0817 10/21/24 0823 10/21/24 1501 10/22/24 0602 10/23/24 0324 10/24/24 0342 10/25/24 0421  WBC 6.6  --   --  9.3 7.9 5.8 5.5  NEUTROABS 5.4  --   --   --   --   --   --   HGB 9.4*   < > 9.2* 8.6* 7.5* 7.2* 7.1*  HCT 30.0*   < > 27.0* 27.3* 22.9* 22.0* 21.8*  MCV 98.7  --   --  98.9 95.4 94.4 95.6  PLT 120*  --   --  62* 61* 70* 88*   < > = values in this interval not displayed.    Basic Metabolic Panel: Recent Labs  Lab 10/22/24 0248 10/22/24 0249 10/23/24 0324 10/24/24 0342 10/25/24 0421 10/26/24 0412  NA 146* 147* 142 136 135 133*  K 4.2 4.2 3.7 3.1* 3.2* 3.6  CL 116* 117* 108 100 100 99  CO2 10* 11* 17* 21* 20* 25  GLUCOSE 136* 136* 83 81 116* 114*  BUN 145* 145* 108* 65* 81* 43*  CREATININE 10.29* 10.34*  8.04* 5.65* 6.56* 4.43*  CALCIUM  8.1* 8.1* 7.8* 7.4* 7.6* 7.7*  MG 1.8  --  1.7  --   --   --   PHOS 11.5* 11.5* 8.0* 4.9* 5.3* 3.7    GFR: Estimated Creatinine Clearance: 7.6 mL/min (A) (by C-G formula based on SCr of 4.43 mg/dL (H)).  Liver Function Tests: Recent Labs  Lab 10/21/24 0817 10/22/24 0249 10/23/24 0324 10/24/24 0342 10/25/24 0421 10/26/24 0412  AST 40  --   --   --   --   --   ALT 24  --   --   --   --   --   ALKPHOS 45  --   --   --   --   --   BILITOT 0.9  --   --   --   --   --   PROT 6.6  --   --   --   --   --   ALBUMIN 3.3* 3.0* 2.5* 2.3* 2.2* 2.2*   No results for input(s): LIPASE, AMYLASE in the last 168 hours. No results for input(s): AMMONIA in the last 168 hours.  Coagulation Profile: No results for input(s): INR, PROTIME in the last 168 hours.  Cardiac Enzymes: No results for input(s): CKTOTAL, CKMB, CKMBINDEX, TROPONINI in the last 168 hours.  BNP (last 3 results) No results for input(s): PROBNP in the last 8760 hours.  Lipid Profile: No results for  input(s): CHOL, HDL, LDLCALC, TRIG, CHOLHDL, LDLDIRECT in the last 72 hours.  Thyroid  Function Tests: No results for input(s): TSH, T4TOTAL, FREET4, T3FREE, THYROIDAB in the last 72 hours.  Anemia Panel: No results for input(s): VITAMINB12, FOLATE, FERRITIN, TIBC, IRON , RETICCTPCT in the last 72 hours.   Urine analysis:    Component Value Date/Time   COLORURINE YELLOW 01/26/2023 0815   APPEARANCEUR CLEAR 01/26/2023 0815   LABSPEC 1.011 01/26/2023 0815   PHURINE 5.0 01/26/2023 0815   GLUCOSEU NEGATIVE 01/26/2023 0815   HGBUR NEGATIVE 01/26/2023 0815   BILIRUBINUR NEGATIVE 01/26/2023 0815   KETONESUR NEGATIVE 01/26/2023 0815   PROTEINUR 100 (A) 01/26/2023 0815   UROBILINOGEN 0.2 01/30/2012 1215   NITRITE NEGATIVE 01/26/2023 0815   LEUKOCYTESUR NEGATIVE 01/26/2023 0815    Sepsis Labs: Lactic Acid, Venous    Component Value Date/Time   LATICACIDVEN 0.7 01/26/2023 0556    MICROBIOLOGY: Recent Results (from the past 240 hours)  Resp panel by RT-PCR (RSV, Flu A&B, Covid) Anterior Nasal Swab     Status: None   Collection Time: 10/21/24  8:06 AM   Specimen: Anterior Nasal Swab  Result Value Ref Range Status   SARS Coronavirus 2 by RT PCR NEGATIVE NEGATIVE Final   Influenza A by PCR NEGATIVE NEGATIVE Final   Influenza B by PCR NEGATIVE NEGATIVE Final    Comment: (NOTE) The Xpert Xpress SARS-CoV-2/FLU/RSV plus assay is intended as an aid in the diagnosis of influenza from Nasopharyngeal swab specimens and should not be used as a sole basis for treatment. Nasal washings and aspirates are unacceptable for Xpert Xpress SARS-CoV-2/FLU/RSV testing.  Fact Sheet for Patients: bloggercourse.com  Fact Sheet for Healthcare Providers: seriousbroker.it  This test is not yet approved or cleared by the [place]  FDA and has been authorized for detection and/or diagnosis of SARS-CoV-2 by FDA under an  Emergency Use Authorization (EUA). This EUA will remain in effect (meaning this test can be used) for the duration of the COVID-19 declaration under Section 564(b)(1) of the Act, 21 U.S.C. section 360bbb-3(b)(1),  unless the authorization is terminated or revoked.     Resp Syncytial Virus by PCR NEGATIVE NEGATIVE Final    Comment: (NOTE) Fact Sheet for Patients: bloggercourse.com  Fact Sheet for Healthcare Providers: seriousbroker.it  This test is not yet approved or cleared by the [place]  FDA and has been authorized for detection and/or diagnosis of SARS-CoV-2 by FDA under an Emergency Use Authorization (EUA). This EUA will remain in effect (meaning this test can be used) for the duration of the COVID-19 declaration under Section 564(b)(1) of the Act, 21 U.S.C. section 360bbb-3(b)(1), unless the authorization is terminated or revoked.  Performed at [facility], [address] [place], [place] [address]   MRSA Next Gen by PCR, Nasal     Status: None   Collection Time: 10/21/24  7:47 PM   Specimen: Nasal Mucosa; Nasal Swab  Result Value Ref Range Status   MRSA by PCR Next Gen NOT DETECTED NOT DETECTED Final    Comment: (NOTE) The GeneXpert MRSA Assay (FDA approved for NASAL specimens only), is one component of a comprehensive MRSA colonization surveillance program. It is not intended to diagnose MRSA infection nor to guide or monitor treatment for MRSA infections. Test performance is not FDA approved in patients less than 30 years old. Performed at [facility], [address] [place], [place] [address]     RADIOLOGY STUDIES/RESULTS: No results found.    LOS: 5 days   Donalda Applebaum, MD  [facility]    To contact the attending provider between 7A-7P or the covering provider during after hours 7P-7A, please log into the web site [email or web address] and access using universal Circle password  for that web site. If you do not have the password, please call the hospital operator.  10/26/2024, 10:45 AM

## 2024-10-26 NOTE — TOC Progression Note (Signed)
 Transition of Care [facility]) - Progression Note    Patient Details  Name: Erica Whitney MRN: [id number] Date of Birth: 01-03-1941  [facility] CM/SW Contact  Isaiah Public, LCSWA Phone Number: 10/26/2024, 4:03 PM  Clinical Narrative:     CSW received consult for possible SNF placement at time of discharge. Patient agreeable for CSW to speak with her daughter Darrick regarding PT recommendation of SNF placement at time of discharge.CSW spoke with patients daughter Darrick regarding PT recommendation of SNF placement for patient at time of discharge. Tawana informed CSW that PTA patient comes from home alone.Patients daughter  expressed understanding of PT recommendation and politely declined SNF placement  for patient at time of discharge. Patients daughter reports plan is for patient to return home. CSW informed patients daughter that CM will follow up for home needs.All questions answered.No further questions reported at this time.    Expected Discharge Plan: Home/Self Care Barriers to Discharge: Continued Medical Work up               Expected Discharge Plan and Services       Living arrangements for the past 2 months: Apartment                                       Social Drivers of Health (SDOH) Interventions SDOH Screenings   Food Insecurity: No Food Insecurity (10/22/2024)  Housing: Low Risk  (10/22/2024)  Transportation Needs: No Transportation Needs (10/22/2024)  Utilities: Not At Risk (10/22/2024)  Social Connections: Socially Isolated (10/22/2024)  Tobacco Use: Medium Risk (10/21/2024)    Readmission Risk Interventions     No data to display

## 2024-10-26 NOTE — Progress Notes (Signed)
 Patient ID: Erica Whitney, female   DOB: [birth year], 83 y.o.   MRN: [id number]   Subjective:  Last HD on 11/8 with 1 kg UF.  She had no UOP charted over 11/8.  Her children elected to start her on dialysis this past week.  She had previously expressed preferences for conservative management and had been seen by palliative care outpatient.  She states that dialysis wasn't bad.  There is ongoing concern that she is doing this for her children and I agree that she may be.  At this time, the patient doesn't want to stop dialysis.  She repeated back to me I want to keep doing dialysis and I want the fistula surgery on my arm.    She has been ambulating in the room and with PT and has a femoral catheter.  Her nurse has concerns about how long she has been at dialysis because it's just been a couple of hours with a small amount of fluid off - we discussed that this is normal for someone just starting on HD.    Review of systems:    Denies shortness of breath or chest pain  Denies n/v  O:BP 119/88 (BP Location: Left Arm)   Pulse 96   Temp 98.2 F (36.8 C) (Oral)   Resp 16   Ht 5' 3 (1.6 m)   Wt 48.6 kg   SpO2 97%   BMI 18.98 kg/m  No intake or output data in the 24 hours ending 10/26/24 1433  Intake/Output: I/O last 3 completed shifts: In: -  Out: 1000 [Other:1000]  Intake/Output this shift:  No intake/output data recorded. Weight change: -0.5 kg  General thin elderly female in bed in no acute distress HEENT normocephalic atraumatic extraocular movements intact sclera anicteric Neck supple trachea midline Lungs clear to auscultation bilaterally normal work of breathing at rest on room air Heart S1S2 no rub Abdomen soft nontender nondistended Extremities no edema  Psych no anxiety or agitation; soft-spoken  Neuro not fully back to baseline but is alert and oriented to person, year, and location  Access Right femoral nontunneled catheter   Recent Labs  Lab 10/21/24 0817  10/21/24 0823 10/21/24 1501 10/22/24 0248 10/22/24 0249 10/23/24 0324 10/24/24 0342 10/25/24 0421 10/26/24 0412  NA 145   < > 146* 146* 147* 142 136 135 133*  K 4.1   < > 4.1 4.2 4.2 3.7 3.1* 3.2* 3.6  CL 115*  --   --  116* 117* 108 100 100 99  CO2 13*  --   --  10* 11* 17* 21* 20* 25  GLUCOSE 169*  --   --  136* 136* 83 81 116* 114*  BUN 137*  --   --  145* 145* 108* 65* 81* 43*  CREATININE 10.15*  --   --  10.29* 10.34* 8.04* 5.65* 6.56* 4.43*  ALBUMIN 3.3*  --   --   --  3.0* 2.5* 2.3* 2.2* 2.2*  CALCIUM  8.3*  --   --  8.1* 8.1* 7.8* 7.4* 7.6* 7.7*  PHOS  --   --   --  11.5* 11.5* 8.0* 4.9* 5.3* 3.7  AST 40  --   --   --   --   --   --   --   --   ALT 24  --   --   --   --   --   --   --   --    < > = values  in this interval not displayed.   Liver Function Tests: Recent Labs  Lab 10/21/24 0817 10/22/24 0249 10/24/24 0342 10/25/24 0421 10/26/24 0412  AST 40  --   --   --   --   ALT 24  --   --   --   --   ALKPHOS 45  --   --   --   --   BILITOT 0.9  --   --   --   --   PROT 6.6  --   --   --   --   ALBUMIN 3.3*   < > 2.3* 2.2* 2.2*   < > = values in this interval not displayed.   No results for input(s): LIPASE, AMYLASE in the last 168 hours. No results for input(s): AMMONIA in the last 168 hours. CBC: Recent Labs  Lab 10/21/24 0817 10/21/24 0823 10/22/24 0602 10/23/24 0324 10/24/24 0342 10/25/24 0421  WBC 6.6  --  9.3 7.9 5.8 5.5  NEUTROABS 5.4  --   --   --   --   --   HGB 9.4*   < > 8.6* 7.5* 7.2* 7.1*  HCT 30.0*   < > 27.3* 22.9* 22.0* 21.8*  MCV 98.7  --  98.9 95.4 94.4 95.6  PLT 120*  --  62* 61* 70* 88*   < > = values in this interval not displayed.   Cardiac Enzymes: No results for input(s): CKTOTAL, CKMB, CKMBINDEX, TROPONINI in the last 168 hours. CBG: Recent Labs  Lab 10/25/24 1216 10/25/24 1600 10/25/24 2115 10/26/24 0749 10/26/24 1146  GLUCAP 94 177* 119* 105* 158*    Iron  Studies:  No results for input(s): IRON ,  TIBC, TRANSFERRIN, FERRITIN in the last 72 hours.  Studies/Results: No results found.   amLODipine   10 mg Oral Daily   aspirin EC  81 mg Oral Daily   atorvastatin   80 mg Oral Daily   calcitRIOL  0.25 mcg Oral Daily   Chlorhexidine  Gluconate Cloth  6 each Topical Q0600   cycloSPORINE  1 drop Both Eyes Daily   doxycycline  100 mg Oral Q12H   feeding supplement (NEPRO CARB STEADY)  237 mL Oral BID BM   ferrous sulfate   325 mg Oral Q breakfast   furosemide   80 mg Intravenous BID   insulin  aspart  0-5 Units Subcutaneous QHS   insulin  aspart  0-9 Units Subcutaneous TID WC   isosorbide-hydrALAZINE   2 tablet Oral TID   metoprolol  succinate  25 mg Oral Daily   pantoprazole   40 mg Oral Daily   sodium chloride  flush  3 mL Intravenous Q12H    BMET    Component Value Date/Time   NA 133 (L) 10/26/2024 0412   NA 141 11/15/2017 0759   K 3.6 10/26/2024 0412   K 4.4 11/15/2017 0759   CL 99 10/26/2024 0412   CO2 25 10/26/2024 0412   CO2 26 11/15/2017 0759   GLUCOSE 114 (H) 10/26/2024 0412   GLUCOSE 176 (H) 11/15/2017 0759   BUN 43 (H) 10/26/2024 0412   BUN 29.8 (H) 11/15/2017 0759   CREATININE 4.43 (H) 10/26/2024 0412   CREATININE 4.38 (H) 02/27/2023 0918   CREATININE 1.1 11/15/2017 0759   CALCIUM  7.7 (L) 10/26/2024 0412   CALCIUM  10.6 (H) 11/15/2017 0759   GFRNONAA 9 (L) 10/26/2024 0412   GFRNONAA 11 (L) 02/15/2023 0946   GFRNONAA 17 (L) 01/27/2021 0950   GFRAA 20 (L) 01/27/2021 0950   CBC    Component  Value Date/Time   WBC 5.5 10/25/2024 0421   RBC 2.28 (L) 10/25/2024 0421   HGB 7.1 (L) 10/25/2024 0421   HGB 9.6 (L) 02/15/2023 0946   HGB 10.8 (L) 11/15/2017 0759   HCT 21.8 (L) 10/25/2024 0421   HCT 33.1 (L) 11/15/2017 0759   PLT 88 (L) 10/25/2024 0421   PLT 253 02/15/2023 0946   PLT 196 11/15/2017 0759   MCV 95.6 10/25/2024 0421   MCV 90.8 11/15/2017 0759   MCH 31.1 10/25/2024 0421   MCHC 32.6 10/25/2024 0421   RDW 17.0 (H) 10/25/2024 0421   RDW 14.2 11/15/2017  0759   LYMPHSABS 0.4 (L) 10/21/2024 0817   LYMPHSABS 2.0 11/15/2017 0759   MONOABS 0.7 10/21/2024 0817   MONOABS 0.9 11/15/2017 0759   EOSABS 0.0 10/21/2024 0817   EOSABS 0.2 11/15/2017 0759   BASOSABS 0.0 10/21/2024 0817   BASOSABS 0.1 11/15/2017 0759      Assessment/Plan: **Uremic CKD 5, now ESRD: Baseline Cr 7's with BUN >112.  As an outpt followed by Dr. Jerrye for >2 years; she had consistently expressed her wishes to not do dialysis after seeing her daughter and sister go through it.  Already followed by palliative care as outpt.  She was initiated on dialysis during hospitalization at request of family and patient without capacity for medical decision making.  Right femoral temporary catheter placed 11/4.  There is ongoing concern that she is doing this for her children and I agree that this may be true.  At this time, the patient doesn't want to stop dialysis.  ----------------- - Next HD tomorrow.  Assess dialysis needs daily   - Stop scheduled IV lasix   - I ordered vein mapping - Will need to call [facility] tomorrow to request a tunneled catheter and permanent access placement.  After dialysis tomorrow, would go ahead and plan to remove the non-tunneled femoral catheter  - Appreciate palliative care and all teams involved in the care of the patient     **Anemia of CKD:  usually receives ESA q2wks outpt and IV iron  PRN through [facility].  She got retacrit  on 10/08/24 - Will give aranesp 100 mcg every Friday  - CBC in AM - PRBC's per primary team discretion    **Hypokalemia - improved with repletion  **CHF - optimize volume status with HD for now; on room air   **HTN: improved   **DM: per primary team   Katheryn JAYSON Jerrye, MD  [facility] 3:32 PM 10/26/2024    [facility]  Advanced Care Planning Note  Advanced care planning was held with the following present: patient, her daughter, and physician.  Nurse present during parts of our conversation as  well.  Conversation was face to face in her hospital room  Consent was given by the patient/family/surrogate for the discussion  The conversation lasted 25 minutes  The following was discussed: the patient previously had expressed not wanting to do dialysis.  Her children brought her to the ER when she was uremic and they had made the decision to initiate dialysis.  Now that she has started on dialysis, she is reluctant to stop.  Wouldn't have chosen it, but we're here now.  She states that the people who were on dialysis that she knew previously had died.  We discussed the option of stopping dialysis and the option of continuing dialysis.  We discussed that if we stopped dialysis, she would pass away and that we would in that case involve palliative  care.  We also discussed strategies for reducing symptoms with dialysis and that these included treatment adherence.   At the conclusion of the meeting the following decisions were made the patient wishes to continue on dialysis.  She is ok with pursuing longer term access - we specifically discussed getting a fistula and getting a tunneled dialysis catheter.   Katheryn JAYSON Saba, MD 3:40 PM 10/26/2024

## 2024-10-27 ENCOUNTER — Inpatient Hospital Stay (HOSPITAL_COMMUNITY)

## 2024-10-27 DIAGNOSIS — N184 Chronic kidney disease, stage 4 (severe): Secondary | ICD-10-CM | POA: Diagnosis not present

## 2024-10-27 DIAGNOSIS — N186 End stage renal disease: Secondary | ICD-10-CM

## 2024-10-27 DIAGNOSIS — Z7189 Other specified counseling: Secondary | ICD-10-CM | POA: Diagnosis not present

## 2024-10-27 DIAGNOSIS — N179 Acute kidney failure, unspecified: Secondary | ICD-10-CM | POA: Diagnosis not present

## 2024-10-27 DIAGNOSIS — Z515 Encounter for palliative care: Secondary | ICD-10-CM | POA: Diagnosis not present

## 2024-10-27 LAB — CBC
HCT: 24 % — ABNORMAL LOW (ref 36.0–46.0)
Hemoglobin: 7.6 g/dL — ABNORMAL LOW (ref 12.0–15.0)
MCH: 30.9 pg (ref 26.0–34.0)
MCHC: 31.7 g/dL (ref 30.0–36.0)
MCV: 97.6 fL (ref 80.0–100.0)
Platelets: 132 K/uL — ABNORMAL LOW (ref 150–400)
RBC: 2.46 MIL/uL — ABNORMAL LOW (ref 3.87–5.11)
RDW: 15.9 % — ABNORMAL HIGH (ref 11.5–15.5)
WBC: 7.7 K/uL (ref 4.0–10.5)
nRBC: 0 % (ref 0.0–0.2)

## 2024-10-27 LAB — RENAL FUNCTION PANEL
Albumin: 2.3 g/dL — ABNORMAL LOW (ref 3.5–5.0)
Anion gap: 13 (ref 5–15)
BUN: 54 mg/dL — ABNORMAL HIGH (ref 8–23)
CO2: 24 mmol/L (ref 22–32)
Calcium: 8.1 mg/dL — ABNORMAL LOW (ref 8.9–10.3)
Chloride: 101 mmol/L (ref 98–111)
Creatinine, Ser: 5.33 mg/dL — ABNORMAL HIGH (ref 0.44–1.00)
GFR, Estimated: 8 mL/min — ABNORMAL LOW (ref >60)
Glucose, Bld: 119 mg/dL — ABNORMAL HIGH (ref 70–99)
Phosphorus: 4.1 mg/dL (ref 2.5–4.6)
Potassium: 3.7 mmol/L (ref 3.5–5.1)
Sodium: 138 mmol/L (ref 135–145)

## 2024-10-27 LAB — GLUCOSE, CAPILLARY
Glucose-Capillary: 112 mg/dL — ABNORMAL HIGH (ref 70–99)
Glucose-Capillary: 113 mg/dL — ABNORMAL HIGH (ref 70–99)
Glucose-Capillary: 172 mg/dL — ABNORMAL HIGH (ref 70–99)
Glucose-Capillary: 175 mg/dL — ABNORMAL HIGH (ref 70–99)

## 2024-10-27 MED ORDER — CHLORHEXIDINE GLUCONATE CLOTH 2 % EX PADS
6.0000 | MEDICATED_PAD | Freq: Every day | CUTANEOUS | Status: DC
  Administered 2024-10-28: 6 via TOPICAL

## 2024-10-27 MED ORDER — HEPARIN SODIUM (PORCINE) 1000 UNIT/ML DIALYSIS: 1000.0000 [IU] | INTRAMUSCULAR | Status: DC | PRN

## 2024-10-27 MED ORDER — HEPARIN SODIUM (PORCINE) 1000 UNIT/ML IJ SOLN
INTRAMUSCULAR | Status: AC
  Filled 2024-10-27: qty 3

## 2024-10-27 MED ORDER — ALTEPLASE 2 MG IJ SOLR: 2.0000 mg | Freq: Once | INTRAMUSCULAR | Status: DC | PRN

## 2024-10-27 NOTE — Progress Notes (Signed)
 [facility] NEPHROLOGY PROGRESS NOTE  Assessment/ Plan: Pt is a 83 y.o. yo female with new ESRD on dialysis.  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  The first HD was on 11/5 and has been tolerating well.  However, there was problem with the catheter this morning presumably due to movement.  She did not complete the HD today.  We will plan for dialysis tomorrow.  We will consult vascular for permanent access placement.  Renal navigator is following to manage outpatient dialysis.  # Hypertension/volume: Monitor BP, UF with HD.  # Anemia of CKD: Continue weekly Aranesp, monitor hemoglobin.  # CHF, optimize volume with dialysis, fluid restriction.  # CKD-MBD: Corrected calcium  level and phosphorus level acceptable.  Also on VDRA.  Subjective: Seen and examined at bedside.  Patient had dialysis this morning however problem with access presumably due to her movement.  She denies nausea, vomiting, chest pain or shortness of breath.  Discussed with HD nurse and the floor nurse. Objective Vital signs in last 24 hours: Vitals:   10/27/24 0852 10/27/24 0855 10/27/24 0901 10/27/24 1608  BP: (!) 139/107 (!) 146/63 (!) 146/63 (!) 145/62  Pulse: (!) 49 77 (!) 59 70  Resp: 17 (!) 22 17 19   Temp:    98.7 F (37.1 C)  TempSrc:    Oral  SpO2: 100% 100% 100% 100%  Weight:      Height:       Weight change: -2 kg  Intake/Output Summary (Last 24 hours) at 10/27/2024 1635 Last data filed at 10/27/2024 0901 Gross per 24 hour  Intake --  Output 0 ml  Net 0 ml       Labs: RENAL PANEL Recent Labs  Lab 10/22/24 0248 10/22/24 0249 10/23/24 0324 10/24/24 0342 10/25/24 0421 10/26/24 0412 10/27/24 0348  NA 146*   < > 142 136 135 133* 138  K 4.2   < > 3.7 3.1* 3.2* 3.6 3.7  CL 116*   < > 108 100 100 99 101  CO2 10*   < > 17* 21* 20* 25 24  GLUCOSE 136*   < > 83 81 116* 114* 119*  BUN 145*   < > 108* 65* 81* 43* 54*   CREATININE 10.29*   < > 8.04* 5.65* 6.56* 4.43* 5.33*  CALCIUM  8.1*   < > 7.8* 7.4* 7.6* 7.7* 8.1*  MG 1.8  --  1.7  --   --   --   --   PHOS 11.5*   < > 8.0* 4.9* 5.3* 3.7 4.1  ALBUMIN  --    < > 2.5* 2.3* 2.2* 2.2* 2.3*   < > = values in this interval not displayed.    Liver Function Tests: Recent Labs  Lab 10/21/24 0817 10/22/24 0249 10/25/24 0421 10/26/24 0412 10/27/24 0348  AST 40  --   --   --   --   ALT 24  --   --   --   --   ALKPHOS 45  --   --   --   --   BILITOT 0.9  --   --   --   --   PROT 6.6  --   --   --   --   ALBUMIN 3.3*   < > 2.2* 2.2* 2.3*   < > = values in this interval not displayed.   No results for input(s): LIPASE, AMYLASE in the last  168 hours. No results for input(s): AMMONIA in the last 168 hours. CBC: Recent Labs    07/02/24 0819 07/02/24 0822 08/13/24 0830 08/27/24 0822 09/10/24 0809 09/10/24 0811 10/08/24 0854 10/08/24 0906 10/22/24 0602 10/22/24 1336 10/23/24 0324 10/24/24 0342 10/25/24 0421 10/27/24 0348  HGB  --    < >  --    < >  --    < >  --    < > 8.6*  --  7.5* 7.2* 7.1* 7.6*  MCV  --   --   --   --   --   --   --    < > 98.9  --  95.4 94.4 95.6 97.6  FERRITIN 226  --  192  --  192  --  281  --   --  273  --   --   --   --   TIBC 188*  --  189*  --  209*  --  185*  --   --  140*  --   --   --   --   IRON  40  --  64  --  81  --  90  --   --  18*  --   --   --   --    < > = values in this interval not displayed.    Cardiac Enzymes: No results for input(s): CKTOTAL, CKMB, CKMBINDEX, TROPONINI in the last 168 hours. CBG: Recent Labs  Lab 10/26/24 1601 10/26/24 2058 10/27/24 0936 10/27/24 1214 10/27/24 1606  GLUCAP 171* 86 112* 113* 172*    Iron  Studies: No results for input(s): IRON , TIBC, TRANSFERRIN, FERRITIN in the last 72 hours. Studies/Results: VAS US  UPPER EXT VEIN MAPPING (PRE-OP AVF) Result Date: 10/27/2024 UPPER EXTREMITY VEIN MAPPING Patient Name:  Erica Whitney [place]  Date of Exam:    10/27/2024 Medical Rec #: [id number]           Accession #:    [id number] Date of Birth: [birth year]          Patient Gender: F Patient Age:   36 years Exam Location:  [facility] Procedure:      VAS US  UPPER EXT VEIN MAPPING (PRE-OP AVF) Referring Phys: KATHERYN SABA --------------------------------------------------------------------------------  Indications: Pre-access. History: ESRD need for HD access.  Limitations: patient movement and body habitus Comparison Study: No previous exams Performing Technologist: Jody Hill RVT, RDMS  Examination Guidelines: A complete evaluation includes B-mode imaging, spectral Doppler, color Doppler, and power Doppler as needed of all accessible portions of each vessel. Bilateral testing is considered an integral part of a complete examination. Limited examinations for reoccurring indications may be performed as noted. +-----------------+-------------+----------+---------+ Right Cephalic   Diameter (cm)Depth (cm)Findings  +-----------------+-------------+----------+---------+ Shoulder             0.32                         +-----------------+-------------+----------+---------+ Prox upper arm       0.22                         +-----------------+-------------+----------+---------+ Mid upper arm        0.24               branching +-----------------+-------------+----------+---------+ Dist upper arm       0.17                         +-----------------+-------------+----------+---------+  Antecubital fossa    0.20                         +-----------------+-------------+----------+---------+ Prox forearm         0.31               branching +-----------------+-------------+----------+---------+ Mid forearm          0.17                         +-----------------+-------------+----------+---------+ Dist forearm         0.10               branching +-----------------+-------------+----------+---------+ Wrist                0.10                          +-----------------+-------------+----------+---------+ +-----------------+-------------+----------+--------------+ Right Basilic    Diameter (cm)Depth (cm)   Findings    +-----------------+-------------+----------+--------------+ Prox upper arm       0.38        0.76                  +-----------------+-------------+----------+--------------+ Mid upper arm        0.24        0.78                  +-----------------+-------------+----------+--------------+ Dist upper arm       0.25        0.76                  +-----------------+-------------+----------+--------------+ Antecubital fossa    0.22        0.33     branching    +-----------------+-------------+----------+--------------+ Prox forearm         0.11        0.16                  +-----------------+-------------+----------+--------------+ Mid forearm          0.10        0.17     branching    +-----------------+-------------+----------+--------------+ Distal forearm       0.07        0.19                  +-----------------+-------------+----------+--------------+ Wrist                                   not visualized +-----------------+-------------+----------+--------------+ +-----------------+-------------+----------+---------+ Left Cephalic    Diameter (cm)Depth (cm)Findings  +-----------------+-------------+----------+---------+ Shoulder             0.28                         +-----------------+-------------+----------+---------+ Prox upper arm       0.27                         +-----------------+-------------+----------+---------+ Mid upper arm        0.25                         +-----------------+-------------+----------+---------+ Dist upper arm       0.27               branching +-----------------+-------------+----------+---------+ Antecubital  fossa    0.37                         +-----------------+-------------+----------+---------+ Prox forearm          0.35               branching +-----------------+-------------+----------+---------+ Mid forearm          0.22               branching +-----------------+-------------+----------+---------+ Dist forearm         0.17                         +-----------------+-------------+----------+---------+ Wrist                0.13                         +-----------------+-------------+----------+---------+ +-----------------+-------------+----------+---------+ Left Basilic     Diameter (cm)Depth (cm)Findings  +-----------------+-------------+----------+---------+ Prox upper arm     0.38/0.22  0.75/0.56 branching +-----------------+-------------+----------+---------+ Mid upper arm        0.31        0.69             +-----------------+-------------+----------+---------+ Dist upper arm       0.24        0.54             +-----------------+-------------+----------+---------+ Antecubital fossa    0.28        0.44   branching +-----------------+-------------+----------+---------+ Prox forearm         0.20        0.28             +-----------------+-------------+----------+---------+ Mid forearm          0.20        0.43             +-----------------+-------------+----------+---------+ Distal forearm       0.20        0.21             +-----------------+-------------+----------+---------+ Wrist                0.16        0.21             +-----------------+-------------+----------+---------+ *See table(s) above for measurements and observations.  Diagnosing physician: Fonda Rim Electronically signed by Fonda Rim on 10/27/2024 at 3:13:57 PM.    Final     Medications: Infusions:   Scheduled Medications:  amLODipine   10 mg Oral Daily   aspirin EC  81 mg Oral Daily   atorvastatin   80 mg Oral Daily   calcitRIOL  0.25 mcg Oral Daily   Chlorhexidine  Gluconate Cloth  6 each Topical Q0600   Chlorhexidine  Gluconate Cloth  6 each Topical Q0600    cycloSPORINE  1 drop Both Eyes Daily   [START ON 10/31/2024] darbepoetin (ARANESP) injection - DIALYSIS  100 mcg Subcutaneous Q Fri-1800   doxycycline  100 mg Oral Q12H   feeding supplement (NEPRO CARB STEADY)  237 mL Oral BID BM   ferrous sulfate   325 mg Oral Q breakfast   insulin  aspart  0-5 Units Subcutaneous QHS   insulin  aspart  0-9 Units Subcutaneous TID WC   isosorbide-hydrALAZINE   2 tablet Oral TID   metoprolol  succinate  25 mg Oral Daily   pantoprazole   40 mg Oral Daily   sodium chloride  flush  3 mL Intravenous  Q12H    have reviewed scheduled and prn medications.  Physical Exam: General:NAD, comfortable Heart:RRR, s1s2 nl Lungs:clear b/l, no crackle Abdomen:soft, Non-tender, non-distended Extremities:No edema Dialysis Access: Femoral groin catheter with dry blood, no sign of active bleeding.  Silvio Sausedo Prasad Fredrick [place] 10/27/2024,4:35 PM  LOS: 6 days

## 2024-10-27 NOTE — TOC Progression Note (Signed)
 Transition of Care [facility]) - Progression Note    Patient Details  Name: Erica Whitney MRN: [id number] Date of Birth: 1941-02-27  [facility] of [facility] CM/SW Contact  Nola Devere Hands, RN Phone Number: 10/27/2024, 11:09 AM  Clinical Narrative:   Patient is a 83 y.o.  female with history of CKD 5-who was brought to the ED with shortness of breath/confusion/uncontrolled hypertension-she was found to have acute pulmonary edema in the setting of progression to ESRD.   Case Manager spoke with patient's daughter- Erica Whitney regarding patient and family decision for Home Health rather than SNF placement. Erica states that her mom has used [facility] in the past and she is fine with using them now. Referral called to Darleene Gowda, [facility] Liaison. Requested Face to Face for HHPT/OT/and Aide. ICM  will continue to follow.   Expected Discharge Plan: Home w Home Health Services Barriers to Discharge: Continued Medical Work up               Expected Discharge Plan and Services   Discharge Planning Services: CM Consult Post Acute Care Choice: Home Health Living arrangements for the past 2 months: Apartment                 DME Arranged: N/A DME Agency: NA       HH Arranged: PT, OT, Nurse's Aide HH Agency: [facility] Date [facility] Agency Contacted: 10/27/24 Time HH Agency Contacted: 1041 Representative spoke with at [facility] Agency: Darleene Gowda   Social Drivers of Health (SDOH) Interventions SDOH Screenings   Food Insecurity: No Food Insecurity (10/22/2024)  Housing: Low Risk  (10/22/2024)  Transportation Needs: No Transportation Needs (10/22/2024)  Utilities: Not At Risk (10/22/2024)  Social Connections: Socially Isolated (10/22/2024)  Tobacco Use: Medium Risk (10/21/2024)    Readmission Risk Interventions     No data to display

## 2024-10-27 NOTE — Procedures (Signed)
 HD Note:  Some information was entered later than the data was gathered due to patient care needs. The stated time with the data is accurate.  Received patient in bed to unit.   Alert and oriented.   Informed consent signed and in chart.   Access used: right femoral temp pigtail HD trialysis catheter Access issues: Catheter did not function related to patient movement and crimping.  Patient movement of her right leg up to her chest.   Patient unable to complete this order because of access issues.  TX duration:0.08 hours  Alert, without acute distress.  Patient received 400 ml of fluid during treatment.  Hand-off given to patient's nurse.   Transported back to the room   Amarien Carne L. Lenon, RN Kidney Dialysis Unit.

## 2024-10-27 NOTE — Progress Notes (Signed)
   10/27/24 0000  BiPAP/CPAP/SIPAP  Reason BIPAP/CPAP not in use Non-compliant (Pt. refused. Educated pt on reasons to wear it. Pt. is non compliant at home as well.)

## 2024-10-27 NOTE — Care Management Important Message (Signed)
 Important Message  Patient Details  Name: Erica Whitney MRN: [id number] Date of Birth: 1941/07/08   Important Message Given:  Yes - Medicare IM     Claretta Deed 10/27/2024, 3:16 PM

## 2024-10-27 NOTE — Plan of Care (Signed)
  Problem: Education: Goal: Ability to demonstrate management of disease process will improve Outcome: Progressing Goal: Ability to verbalize understanding of medication therapies will improve Outcome: Progressing   Problem: Activity: Goal: Capacity to carry out activities will improve Outcome: Progressing   Problem: Cardiac: Goal: Ability to achieve and maintain adequate cardiopulmonary perfusion will improve Outcome: Progressing   Problem: Education: Goal: Knowledge of General Education information will improve Description: Including pain rating scale, medication(s)/side effects and non-pharmacologic comfort measures Outcome: Progressing   Problem: Health Behavior/Discharge Planning: Goal: Ability to manage health-related needs will improve Outcome: Progressing   Problem: Clinical Measurements: Goal: Ability to maintain clinical measurements within normal limits will improve Outcome: Progressing Goal: Will remain free from infection Outcome: Progressing Goal: Diagnostic test results will improve Outcome: Progressing Goal: Respiratory complications will improve Outcome: Progressing Goal: Cardiovascular complication will be avoided Outcome: Progressing   Problem: Activity: Goal: Risk for activity intolerance will decrease Outcome: Progressing   Problem: Nutrition: Goal: Adequate nutrition will be maintained Outcome: Progressing   Problem: Coping: Goal: Level of anxiety will decrease Outcome: Progressing   Problem: Elimination: Goal: Will not experience complications related to bowel motility Outcome: Progressing Goal: Will not experience complications related to urinary retention Outcome: Progressing   Problem: Pain Managment: Goal: General experience of comfort will improve and/or be controlled Outcome: Progressing   Problem: Safety: Goal: Ability to remain free from injury will improve Outcome: Progressing   Problem: Skin Integrity: Goal: Risk for  impaired skin integrity will decrease Outcome: Progressing

## 2024-10-27 NOTE — Plan of Care (Signed)
  Problem: Education: Goal: Ability to demonstrate management of disease process will improve Outcome: Progressing Goal: Ability to verbalize understanding of medication therapies will improve Outcome: Progressing   Problem: Activity: Goal: Capacity to carry out activities will improve Outcome: Progressing   Problem: Cardiac: Goal: Ability to achieve and maintain adequate cardiopulmonary perfusion will improve Outcome: Progressing   Problem: Education: Goal: Knowledge of General Education information will improve Description: Including pain rating scale, medication(s)/side effects and non-pharmacologic comfort measures Outcome: Progressing   Problem: Health Behavior/Discharge Planning: Goal: Ability to manage health-related needs will improve Outcome: Progressing   Problem: Clinical Measurements: Goal: Ability to maintain clinical measurements within normal limits will improve Outcome: Progressing Goal: Will remain free from infection Outcome: Progressing Goal: Diagnostic test results will improve Outcome: Progressing Goal: Respiratory complications will improve Outcome: Progressing Goal: Cardiovascular complication will be avoided Outcome: Progressing   Problem: Activity: Goal: Risk for activity intolerance will decrease Outcome: Progressing   Problem: Nutrition: Goal: Adequate nutrition will be maintained Outcome: Progressing   Problem: Coping: Goal: Level of anxiety will decrease Outcome: Progressing   Problem: Elimination: Goal: Will not experience complications related to bowel motility Outcome: Progressing Goal: Will not experience complications related to urinary retention Outcome: Progressing   Problem: Pain Managment: Goal: General experience of comfort will improve and/or be controlled Outcome: Progressing   Problem: Safety: Goal: Ability to remain free from injury will improve Outcome: Progressing   Problem: Skin Integrity: Goal: Risk for  impaired skin integrity will decrease Outcome: Progressing   Problem: Education: Goal: Knowledge of disease and its progression will improve Outcome: Progressing Goal: Individualized Educational Video(s) Outcome: Progressing   Problem: Fluid Volume: Goal: Compliance with measures to maintain balanced fluid volume will improve Outcome: Progressing   Problem: Health Behavior/Discharge Planning: Goal: Ability to manage health-related needs will improve Outcome: Progressing   Problem: Nutritional: Goal: Ability to make healthy dietary choices will improve Outcome: Progressing   Problem: Clinical Measurements: Goal: Complications related to the disease process, condition or treatment will be avoided or minimized Outcome: Progressing

## 2024-10-27 NOTE — Progress Notes (Signed)
 Daily Progress Note   Patient Name: Erica Whitney       Date: 10/27/2024 DOB: [birth year]  Age: 83 y.o. MRN#: [id number] Attending Physician: Raenelle Donalda HERO, MD Primary Care Physician: Pa, [facility] Admit Date: 10/21/2024 Length of Stay: 6 days  Reason for Consultation/Follow-up: Establishing goals of care  Subjective:  Subjective: EMR reviewed including personal review of recent notes from hospitalist, palliative care, nephrology, and bedside care team.  Recent labs reviewed which reveal creatinine 5.33, albumin 2.3, hemoglobin 7.6, platelets 132.  Note trouble with femoral dialysis catheter resulting in significantly shortened dialysis today.  Also note completion of bilateral upper extremity vein mapping today.  I saw and examined patient in conjunction with Fairy Kemps, PA from palliative care team.  On arrival she was sitting in bedside chair in no distress.  She is awake and alert though hard of hearing.  At times it is difficult to tell if she is really understanding question, but I think this is more due to hearing issue than mental status at this time.  It is noted in chart that she waxes and wanes throughout the day.  She tells us  that she is tolerating dialysis okay and has no particular complaints.  On review of systems she notes being tired, having abdominal pain, and having headache after dialysis sessions.  We discussed regarding dialysis and she tells us  that they have already moved tomorrow dialysis catheter and points to chest and saying they are going to place when they are later per her conversation with Dr. Jerrye.  Discussed her prior desire to avoid dialysis and she reports now that it is not too bad now that she has started it.  She has a superficial understanding of circumstances and care plan, but it is unclear regarding the level of her understanding with complexity of her disease.  Discussed the dialysis goal is to maintain quality of life and acceptable level  but it does not fix the underlying issue going on from having chronic renal failure.  Discussed that there will be continued overall disease progression of her multiple comorbidities even with continued dialysis treatment.  We also discussed that if she reaches a point where she no longer feels that she is gaining benefits that outweigh the burdens associated with dialysis, the option would always be available to forego further dialysis at that time.  At this point, she reports she wants to continue with dialysis she has been started on recently.  Will continue to follow along and progress conversation as appropriate based upon her clinical course.   Review of Systems tired, having abdominal pain, and having headache after dialysis sessions.  Objective:   Vital Signs:  BP (!) 146/63   Pulse (!) 59   Temp 99.2 F (37.3 C)   Resp 17   Ht 5' 3 (1.6 m)   Wt 47.5 kg   SpO2 100%   BMI 18.55 kg/m   Physical Exam: General: Frail-appearing.  No acute distress ENT: Atraumatic normocephalic CV: Regular rate and rhythm Lungs: No increased work of breathing Skin: Warm and dry  Imaging: @IMAGES @  I personally reviewed recent imaging.   Assessment & Plan:   Assessment:82 y.o. female with past medical history of HTN, HLD, CKD 5 not on dialysis, history of breast cancer in remission, uterine cancer s/p hysterectomy (1995), T2DM admitted on 10/21/2024 with hypertensive emergency pulmonary edema, AKI on CKD5, and metabolic encephalopathy.  She had previously stated as an outpatient and she would not want dialysis  long-term, however, trial of dialysis was started and now she has stated plan to continue at this time (although her understanding of her situation is still not clear) recommendations/Plan: # Complex medical decision making/goals of care:  - She has a superficial understanding of her medical condition and care plan.  It is not clear if she really understands the severity of her condition  and the long-term need for continued dialysis.  At this point, however, she states she wants to continue with dialysis moving forward.  Will continue to follow and progress conversation based upon her clinical course and understanding of her situation.  -  Code Status: Limited: Do not attempt resuscitation (DNR) -DNR-LIMITED -Do Not Intubate/DNI   Prognosis: Guarded  # Psychosocial Support:  - Family including her children  # Discharge Planning: To Be Determined  -  Discussed with: Patient  Thank you for allowing the palliative care team to participate in the care Erica Whitney.  Amaryllis Meissner, MD Palliative Care Provider PMT # [phone]  If patient remains symptomatic despite maximum doses, please call PMT at [phone] between 0700 and 1900. Outside of these hours, please call attending, as PMT does not have night coverage.   I personally spent a total of 43 minutes in the care of the patient today including preparing to see the patient, getting/reviewing separately obtained history, performing a medically appropriate exam/evaluation, counseling and educating, and documenting clinical information in the EHR.

## 2024-10-27 NOTE — Progress Notes (Signed)
 Erica Whitney [id number] [facility] liaison note:   This is a current patient with [facility], followed for outpatient palliative care.   [facility] will continue to follow for discharge disposition.   Please call with any OPP or hospice related questions or concerns.   Thank you, Eleanor Nail, LPN [phone]

## 2024-10-27 NOTE — Progress Notes (Signed)
 Physical Therapy Treatment Patient Details Name: Erica Whitney MRN: [id number] DOB: Oct 14, 1941 Today's Date: 10/27/2024   History of Present Illness Pt is an 83 y.o. female who presented 10/21/24 with SOB, AMS, and R-sided pain. Pt admitted with acute hypoxic respiratory failure due to pulmonary edema, CKD V now ESRD, volume overload, uremic encephalopathy, and concern for pneumonia. PMH: anemia in CKD, arthritis, breast cancer, CKD stage IV, depression, DM, hypercholesteremia, HTN, insomnia, peptic ulcer, uterine cancer    PT Comments  The pt is demonstrating good functional progress, now only needing CGA-supervision for all functional mobility. However, she is reliant on a RW for balance when ambulating, displaying deficits in leg strength and balance with attempts to ambulate without UE support. She remains at risk for falls and needs reminders to keep her RW more proximal for safety when ambulating. Will continue to follow acutely.    If plan is discharge home, recommend the following: A little help with walking and/or transfers;A little help with bathing/dressing/bathroom;Assistance with cooking/housework;Direct supervision/assist for medications management;Direct supervision/assist for financial management;Assist for transportation;Supervision due to cognitive status   Can travel by private vehicle     Yes  Equipment Recommendations  None recommended by PT    Recommendations for Other Services       Precautions / Restrictions Precautions Precautions: Fall Restrictions Weight Bearing Restrictions Per Provider Order: No     Mobility  Bed Mobility Overal bed mobility: Needs Assistance Bed Mobility: Supine to Sit     Supine to sit: HOB elevated, Supervision     General bed mobility comments: Supervision for safety exiting L EOB    Transfers Overall transfer level: Needs assistance Equipment used: Rolling walker (2 wheels), None Transfers: Sit to/from Stand Sit to  Stand: Contact guard assist           General transfer comment: Pt stood  from EOB to RW 1x with no LOB, CGA for safety. x5 serial reps from recliner to no AD, CGA for safety    Ambulation/Gait Ambulation/Gait assistance: Contact guard assist Gait Distance (Feet): 160 Feet Assistive device: Rolling walker (2 wheels), None Gait Pattern/deviations: Step-through pattern, Decreased step length - right, Decreased step length - left, Decreased stride length, Trunk flexed Gait velocity: reduced Gait velocity interpretation: <1.31 ft/sec, indicative of household ambulator   General Gait Details: Pt takes slow, small steps with a flexed posture. Pt needed frequent cues to remain more proximal to her RW with poor carryover noted. Attempted to ambulate ~5 ft without UE support, but pt unsteady and shaky in her legs, expressing a need for the RW. No LOB when using RW, CGA for safety   Stairs             Wheelchair Mobility     Tilt Bed    Modified Rankin (Stroke Patients Only)       Balance Overall balance assessment: Needs assistance Sitting-balance support: No upper extremity supported, Feet supported Sitting balance-Leahy Scale: Good     Standing balance support: Bilateral upper extremity supported, During functional activity, No upper extremity supported Standing balance-Leahy Scale: Fair Standing balance comment: Able to stand statically without UE support with trunk sway and CGA. Reliant on RW to ambulate                            Communication Communication Communication: Impaired Factors Affecting Communication: Hearing impaired  Cognition Arousal: Alert Behavior During Therapy: WFL for tasks assessed/performed   PT -  Cognitive impairments: History of cognitive impairments, Orientation, Awareness, Attention, Memory, Initiation, Sequencing, Safety/Judgement                       PT - Cognition Comments: Daughter reports pt never knows the  year or date. Daughter reports pt has a hx of some memory deficits but no diagnosis of dementia. Appears to be approaching her baseline, but still displays poor attention to task, initiation, and awareness. Followed cues and remained on task better today, but needs reminders to remain proximal to RW Following commands: Impaired Following commands impaired: Follows one step commands with increased time, Follows multi-step commands inconsistently, Follows multi-step commands with increased time    Cueing Cueing Techniques: Verbal cues, Tactile cues, Visual cues  Exercises Other Exercises Other Exercises: x5 serial sit <> stand from chair, CGA    General Comments General comments (skin integrity, edema, etc.): MD cleared pt for session      Pertinent Vitals/Pain Pain Assessment Pain Assessment: Faces Faces Pain Scale: Hurts a little bit Pain Location: generalized with mobility Pain Descriptors / Indicators: Discomfort, Grimacing Pain Intervention(s): Limited activity within patient's tolerance, Monitored during session, Repositioned    Home Living                          Prior Function            PT Goals (current goals can now be found in the care plan section) Acute Rehab PT Goals Patient Stated Goal: to get better PT Goal Formulation: With patient Time For Goal Achievement: 11/07/24 Potential to Achieve Goals: Good Progress towards PT goals: Progressing toward goals    Frequency    Min 2X/week      PT Plan      Co-evaluation              AM-PAC PT 6 Clicks Mobility   Outcome Measure  Help needed turning from your back to your side while in a flat bed without using bedrails?: A Little Help needed moving from lying on your back to sitting on the side of a flat bed without using bedrails?: A Little Help needed moving to and from a bed to a chair (including a wheelchair)?: A Little Help needed standing up from a chair using your arms (e.g.,  wheelchair or bedside chair)?: A Little Help needed to walk in hospital room?: A Little Help needed climbing 3-5 steps with a railing? : A Lot 6 Click Score: 17    End of Session Equipment Utilized During Treatment: Gait belt Activity Tolerance: Patient tolerated treatment well Patient left: with call bell/phone within reach;in chair;with chair alarm set Nurse Communication: Mobility status PT Visit Diagnosis: Unsteadiness on feet (R26.81);Other abnormalities of gait and mobility (R26.89);Muscle weakness (generalized) (M62.81);Difficulty in walking, not elsewhere classified (R26.2)     Time: 8767-8744 PT Time Calculation (min) (ACUTE ONLY): 23 min  Charges:    $Gait Training: 8-22 mins $Therapeutic Activity: 8-22 mins PT General Charges $$ ACUTE PT VISIT: 1 Visit                     [name], PT, DPT Acute Rehabilitation Services  Office: [phone]    [name] 10/27/2024, 1:01 PM

## 2024-10-27 NOTE — Progress Notes (Signed)
 PROGRESS NOTE        PATIENT DETAILS Name: Erica Whitney Age: 83 y.o. Sex: female Date of Birth: [birth year] Admit Date: 10/21/2024 Admitting Physician Harden Jude GAILS, MD PCP:[facility]  Brief Summary: Patient is a 83 y.o.  female with history of CKD 5-who was brought to the ED with shortness of breath/confusion/uncontrolled hypertension-she was found to have acute pulmonary edema in the setting of progression to ESRD.  She was initially admitted to the hospitalist service but became agitated-ripped off BiPAP-had uncontrolled hypertension-requiring initiation of nitroglycerin drip-and transferred to the ICU.  Per nephrology team-patient had not wanted dialysis as an outpatient-however patient was not able to make her own decisions due to encephalopathy-family elected to start hemodialysis.  She subsequently improved-and was transferred to [facility] on 11/7.  Significant events: 11/4>> admit to [facility]-but transferred to ICU due to agitation-nitroglycerin drip 11/5>> started on HD 11/7>> transferred to [facility].  Significant studies: 11/4>> chest x-ray: Findings compatible with interstitial edema 11/4>> echo: EF 45-50%,+ wall motion abnormality, moderately elevated pulmonary artery systolic pressure.  Severe MR, moderate TR  Significant microbiology data: 11/4>> COVID/influenza/RSV PCR: Negative  Procedures: 11/4>> temporary right groin dialysis catheter by PCCM.  Consults: Nephrology cardiology PCCM Palliative care  Subjective: Seen earlier this morning at hemodialysis-sleepy but answering questions appropriately-.  Objective: Vitals: Blood pressure (!) 146/63, pulse (!) 59, temperature 99.2 F (37.3 C), resp. rate 17, height 5' 3 (1.6 m), weight 47.5 kg, SpO2 100%.   Exam: Gen Exam:Alert awake-not in any distress HEENT:atraumatic, normocephalic Chest: B/L clear to auscultation anteriorly CVS:S1S2 regular Abdomen:soft non tender, non  distended Extremities:no edema Neurology: Non focal Skin: no rash  Pertinent Labs/Radiology:    Latest Ref Rng & Units 10/27/2024    3:48 AM 10/25/2024    4:21 AM 10/24/2024    3:42 AM  CBC  WBC 4.0 - 10.5 K/uL 7.7  5.5  5.8   Hemoglobin 12.0 - 15.0 g/dL 7.6  7.1  7.2   Hematocrit 36.0 - 46.0 % 24.0  21.8  22.0   Platelets 150 - 400 K/uL 132  88  70     Lab Results  Component Value Date   NA 138 10/27/2024   K 3.7 10/27/2024   CL 101 10/27/2024   CO2 24 10/27/2024      Assessment/Plan: Acute hypoxic respiratory failure secondary to acute pulmonary edema in the setting of CKD 5 with 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  Nephrology/palliative care engaging family/patient-after extra discussion-plans are now to pursue [facility] catheter/AV fistula-nephrology will consult vascular surgery.  Acute metabolic encephalopathy Secondary to uremia Although encephalopathy markedly better-slow to respond but does respond appropriately Delirium precautions.  Hypertensive crisis Required ICU transfer nitroglycerin infusion Blood pressure now much better after HD Continue amlodipine , metoprolol , BiDil  Elevated troponin Doubt type I non-STEMI Suspect related to demand ischemia/type II non-STEMI. Echo as above with mildly suppressed EF and some regional wall motion abnormality Cardiology ongoing-medically managed at this point.  Normocytic anemia Secondary to critical illness and anemia related to ESRD Defer Aranesp/iron  therapies to nephrology service  Thrombocytopenia Mild Suspect related to uremia Hopefully will improve with supportive care-continue to follow trend  Hypokalemia Repleted-recheck periodically  DM-2  CBG stable SSI  Recent Labs     10/26/24 1601 10/26/24 2058 10/27/24 0936  GLUCAP 171* 86 112*     History of depression Apparently no longer taking Zoloft  or Remeron  per home MAR. Supportive care  Insomnia Trazodone   Code status:   Code Status: Limited: Do not attempt resuscitation (DNR) -DNR-LIMITED -Do Not Intubate/DNI    DVT Prophylaxis: Place and maintain sequential compression device Start: 10/22/24 0949 SCDs Start: 10/21/24 1948   Family Communication: Daughter at bedside 11/9   Disposition Plan: Status is: Inpatient Remains inpatient appropriate because: Severity of illness   Planned Discharge Destination:Home health versus SNF   Diet: Diet Order             Diet renal with fluid restriction Fluid restriction: 1500 mL Fluid; Room service appropriate? Yes; Fluid consistency: Thin  Diet effective now                     Antimicrobial agents: Anti-infectives (From admission, onward)    Start     Dose/Rate Route Frequency Ordered Stop   10/23/24 1100  doxycycline (VIBRA-TABS) tablet 100 mg        100 mg Oral Every 12 hours 10/23/24 1005 10/28/24 0959        MEDICATIONS: Scheduled Meds:  amLODipine   10 mg Oral Daily   aspirin EC  81 mg Oral Daily   atorvastatin   80 mg Oral Daily   calcitRIOL  0.25 mcg Oral Daily   Chlorhexidine  Gluconate Cloth  6 each Topical Q0600   Chlorhexidine  Gluconate Cloth  6 each Topical Q0600   cycloSPORINE  1 drop Both Eyes Daily   [START ON 10/31/2024] darbepoetin (ARANESP) injection - DIALYSIS  100 mcg Subcutaneous Q Fri-1800   doxycycline  100 mg Oral Q12H   feeding supplement (NEPRO CARB STEADY)  237 mL Oral BID BM   ferrous sulfate   325 mg Oral Q breakfast   insulin  aspart  0-5 Units Subcutaneous QHS   insulin  aspart  0-9 Units Subcutaneous TID WC   isosorbide-hydrALAZINE   2 tablet Oral TID   metoprolol  succinate  25 mg Oral Daily   pantoprazole   40 mg Oral Daily   sodium chloride  flush  3 mL Intravenous Q12H   Continuous  Infusions:   PRN Meds:.acetaminophen  **OR** acetaminophen , albuterol , artificial tears, fentaNYL  (SUBLIMAZE ) injection, labetalol , senna-docusate, traZODone    I have personally reviewed following labs and imaging studies  LABORATORY DATA: CBC: Recent Labs  Lab 10/21/24 0817 10/21/24 0823 10/22/24 0602 10/23/24 0324 10/24/24 0342 10/25/24 0421 10/27/24 0348  WBC 6.6  --  9.3 7.9 5.8 5.5 7.7  NEUTROABS 5.4  --   --   --   --   --   --   HGB 9.4*   < > 8.6* 7.5* 7.2* 7.1* 7.6*  HCT 30.0*   < > 27.3* 22.9* 22.0* 21.8* 24.0*  MCV 98.7  --  98.9 95.4 94.4 95.6 97.6  PLT 120*  --  62* 61* 70* 88* 132*   < > = values in this interval not displayed.    Basic Metabolic Panel: Recent Labs  Lab 10/22/24 0248 10/22/24 0249 10/23/24 0324 10/24/24 0342 10/25/24 0421 10/26/24 0412 10/27/24 0348  NA 146*   < > 142 136 135 133* 138  K 4.2   < > 3.7 3.1* 3.2* 3.6 3.7  CL 116*   < > 108 100 100 99 101  CO2 10*   < > 17* 21* 20* 25 24  GLUCOSE 136*   < > 83 81 116* 114* 119*  BUN 145*   < > 108* 65* 81* 43* 54*  CREATININE 10.29*   < > 8.04* 5.65* 6.56* 4.43* 5.33*  CALCIUM  8.1*   < > 7.8* 7.4* 7.6* 7.7* 8.1*  MG 1.8  --  1.7  --   --   --   --   PHOS 11.5*   < > 8.0* 4.9* 5.3* 3.7 4.1   < > = values in this interval not displayed.    GFR: Estimated Creatinine Clearance: 6.2 mL/min (A) (by C-G formula based on SCr of 5.33 mg/dL (H)).  Liver Function Tests: Recent Labs  Lab 10/21/24 0817 10/22/24 0249 10/23/24 0324 10/24/24 0342 10/25/24 0421 10/26/24 0412 10/27/24 0348  AST 40  --   --   --   --   --   --   ALT 24  --   --   --   --   --   --   ALKPHOS 45  --   --   --   --   --   --   BILITOT 0.9  --   --   --   --   --   --   PROT 6.6  --   --   --   --   --   --   ALBUMIN 3.3*   < > 2.5* 2.3* 2.2* 2.2* 2.3*   < > = values in this interval not displayed.   No results for input(s): LIPASE, AMYLASE in the last 168 hours. No results for input(s): AMMONIA in  the last 168 hours.  Coagulation Profile: No results for input(s): INR, PROTIME in the last 168 hours.  Cardiac Enzymes: No results for input(s): CKTOTAL, CKMB, CKMBINDEX, TROPONINI in the last 168 hours.  BNP (last 3 results) No results for input(s): PROBNP in the last 8760 hours.  Lipid Profile: No results for input(s): CHOL, HDL, LDLCALC, TRIG, CHOLHDL, LDLDIRECT in the last 72 hours.  Thyroid  Function Tests: No results for input(s): TSH, T4TOTAL, FREET4, T3FREE, THYROIDAB in the last 72 hours.  Anemia Panel: No results for input(s): VITAMINB12, FOLATE, FERRITIN, TIBC, IRON , RETICCTPCT in the last 72 hours.   Urine analysis:    Component Value Date/Time   COLORURINE YELLOW 01/26/2023 0815   APPEARANCEUR CLEAR 01/26/2023 0815   LABSPEC 1.011 01/26/2023 0815   PHURINE 5.0 01/26/2023 0815   GLUCOSEU NEGATIVE 01/26/2023 0815   HGBUR NEGATIVE 01/26/2023 0815   BILIRUBINUR NEGATIVE 01/26/2023 0815   KETONESUR NEGATIVE 01/26/2023 0815   PROTEINUR 100 (A) 01/26/2023 0815   UROBILINOGEN 0.2 01/30/2012 1215   NITRITE NEGATIVE 01/26/2023 0815   LEUKOCYTESUR NEGATIVE 01/26/2023 0815    Sepsis Labs: Lactic Acid, Venous    Component Value Date/Time   LATICACIDVEN 0.7 01/26/2023 0556    MICROBIOLOGY: Recent Results (from the past 240 hours)  Resp panel by RT-PCR (RSV, Flu A&B, Covid) Anterior Nasal Swab     Status: None   Collection Time: 10/21/24  8:06 AM   Specimen: Anterior Nasal Swab  Result Value Ref Range Status   SARS Coronavirus 2 by RT PCR NEGATIVE NEGATIVE Final   Influenza A by PCR NEGATIVE NEGATIVE Final   Influenza B by PCR NEGATIVE NEGATIVE Final    Comment: (NOTE) The Xpert Xpress SARS-CoV-2/FLU/RSV plus assay is intended as an aid in the diagnosis of influenza from Nasopharyngeal swab specimens and should not be used as a sole basis for treatment. Nasal  washings and aspirates are unacceptable for Xpert  Xpress SARS-CoV-2/FLU/RSV testing.  Fact Sheet for Patients: bloggercourse.com  Fact Sheet for Healthcare Providers: seriousbroker.it  This test is not yet approved or cleared by the [place]  FDA and has been authorized for detection and/or diagnosis of SARS-CoV-2 by FDA under an Emergency Use Authorization (EUA). This EUA will remain in effect (meaning this test can be used) for the duration of the COVID-19 declaration under Section 564(b)(1) of the Act, 21 U.S.C. section 360bbb-3(b)(1), unless the authorization is terminated or revoked.     Resp Syncytial Virus by PCR NEGATIVE NEGATIVE Final    Comment: (NOTE) Fact Sheet for Patients: bloggercourse.com  Fact Sheet for Healthcare Providers: seriousbroker.it  This test is not yet approved or cleared by the [place]  FDA and has been authorized for detection and/or diagnosis of SARS-CoV-2 by FDA under an Emergency Use Authorization (EUA). This EUA will remain in effect (meaning this test can be used) for the duration of the COVID-19 declaration under Section 564(b)(1) of the Act, 21 U.S.C. section 360bbb-3(b)(1), unless the authorization is terminated or revoked.  Performed at [facility], [address] [place], [place] [address]   MRSA Next Gen by PCR, Nasal     Status: None   Collection Time: 10/21/24  7:47 PM   Specimen: Nasal Mucosa; Nasal Swab  Result Value Ref Range Status   MRSA by PCR Next Gen NOT DETECTED NOT DETECTED Final    Comment: (NOTE) The GeneXpert MRSA Assay (FDA approved for NASAL specimens only), is one component of a comprehensive MRSA colonization surveillance program. It is not intended to diagnose MRSA infection nor to guide or monitor treatment for MRSA infections. Test performance is not FDA approved in patients less than 26 years old. Performed at [facility], [address] [place], [place] [address]     RADIOLOGY STUDIES/RESULTS: VAS US  UPPER EXT VEIN MAPPING (PRE-OP AVF) Result Date: 10/27/2024 UPPER EXTREMITY VEIN MAPPING Patient Name:  MERRIEL ZINGER E[place]holt  Date of Exam:   10/27/2024 Medical Rec #: [id number]           Accession #:    [id number] Date of Birth: 03/16/1941          Patient Gender: F Patient Age:   58 years Exam Location:  [facility] Procedure:      VAS US  UPPER EXT VEIN MAPPING (PRE-OP AVF) Referring Phys: KATHERYN SABA --------------------------------------------------------------------------------  Indications: Pre-access. History: ESRD need for HD access.  Limitations: patient movement and body habitus Comparison Study: No previous exams Performing Technologist: Jody Hill RVT, RDMS  Examination Guidelines: A complete evaluation includes B-mode imaging, spectral Doppler, color Doppler, and power Doppler as needed of all accessible portions of each vessel. Bilateral testing is considered an integral part of a complete examination. Limited examinations for reoccurring indications may be performed as noted. +-----------------+-------------+----------+---------+ Right Cephalic   Diameter (cm)Depth (cm)Findings  +-----------------+-------------+----------+---------+ Shoulder             0.32                         +-----------------+-------------+----------+---------+ Prox upper arm       0.22                         +-----------------+-------------+----------+---------+ Mid upper arm        0.24  branching +-----------------+-------------+----------+---------+ Dist upper arm       0.17                         +-----------------+-------------+----------+---------+ Antecubital fossa    0.20                         +-----------------+-------------+----------+---------+ Prox forearm         0.31               branching +-----------------+-------------+----------+---------+ Mid forearm          0.17                          +-----------------+-------------+----------+---------+ Dist forearm         0.10               branching +-----------------+-------------+----------+---------+ Wrist                0.10                         +-----------------+-------------+----------+---------+ +-----------------+-------------+----------+--------------+ Right Basilic    Diameter (cm)Depth (cm)   Findings    +-----------------+-------------+----------+--------------+ Prox upper arm       0.38        0.76                  +-----------------+-------------+----------+--------------+ Mid upper arm        0.24        0.78                  +-----------------+-------------+----------+--------------+ Dist upper arm       0.25        0.76                  +-----------------+-------------+----------+--------------+ Antecubital fossa    0.22        0.33     branching    +-----------------+-------------+----------+--------------+ Prox forearm         0.11        0.16                  +-----------------+-------------+----------+--------------+ Mid forearm          0.10        0.17     branching    +-----------------+-------------+----------+--------------+ Distal forearm       0.07        0.19                  +-----------------+-------------+----------+--------------+ Wrist                                   not visualized +-----------------+-------------+----------+--------------+ +-----------------+-------------+----------+---------+ Left Cephalic    Diameter (cm)Depth (cm)Findings  +-----------------+-------------+----------+---------+ Shoulder             0.28                         +-----------------+-------------+----------+---------+ Prox upper arm       0.27                         +-----------------+-------------+----------+---------+ Mid upper arm        0.25                         +-----------------+-------------+----------+---------+  Dist upper arm        0.27               branching +-----------------+-------------+----------+---------+ Antecubital fossa    0.37                         +-----------------+-------------+----------+---------+ Prox forearm         0.35               branching +-----------------+-------------+----------+---------+ Mid forearm          0.22               branching +-----------------+-------------+----------+---------+ Dist forearm         0.17                         +-----------------+-------------+----------+---------+ Wrist                0.13                         +-----------------+-------------+----------+---------+ +-----------------+-------------+----------+---------+ Left Basilic     Diameter (cm)Depth (cm)Findings  +-----------------+-------------+----------+---------+ Prox upper arm     0.38/0.22  0.75/0.56 branching +-----------------+-------------+----------+---------+ Mid upper arm        0.31        0.69             +-----------------+-------------+----------+---------+ Dist upper arm       0.24        0.54             +-----------------+-------------+----------+---------+ Antecubital fossa    0.28        0.44   branching +-----------------+-------------+----------+---------+ Prox forearm         0.20        0.28             +-----------------+-------------+----------+---------+ Mid forearm          0.20        0.43             +-----------------+-------------+----------+---------+ Distal forearm       0.20        0.21             +-----------------+-------------+----------+---------+ Wrist                0.16        0.21             +-----------------+-------------+----------+---------+ *See table(s) above for measurements and observations.  Diagnosing physician:    Preliminary       LOS: 6 days   Donalda Applebaum, MD  [facility]    To contact the attending provider between 7A-7P or the covering provider during after hours 7P-7A,  please log into the web site [email or web address] and access using universal [place] password for that web site. If you do not have the password, please call the hospital operator.  10/27/2024, 11:47 AM

## 2024-10-27 NOTE — Progress Notes (Signed)
 PT Cancellation Note  Patient Details Name: DEZIYA AMERO MRN: [id number] DOB: 11-Jan-1941   Cancelled Treatment:    Reason Eval/Treat Not Completed: (P) Patient at procedure or test/unavailable. Pt at HD. Will plan to follow-up later as time permits.   [name], PT, DPT Acute Rehabilitation Services  Office: [phone]    [name] 10/27/2024, 8:01 AM

## 2024-10-27 NOTE — Progress Notes (Signed)
 BUE vein mapping has been completed.   Results can be found under chart review under CV PROC. 10/27/2024 11:44 AM Savion Washam RVT, RDMS

## 2024-10-27 NOTE — Progress Notes (Signed)
 Requested to see pt for out-pt HD needs at d/c. Spoke to pt's daughter, Darrick, via phone. Introduced self and explained role. Discussed out-pt HD options. Pt's daughter prefers [facility] if possible. Referral submitted to [facility] admissions this afternoon for review. Pt's daughter states that pt's family or pt's aide will assist with transportation to HD appts at d/c. Will assist as needed.   Randine Mungo Dialysis Navigator [phone]

## 2024-10-28 DIAGNOSIS — Z7189 Other specified counseling: Secondary | ICD-10-CM | POA: Diagnosis not present

## 2024-10-28 DIAGNOSIS — N186 End stage renal disease: Secondary | ICD-10-CM | POA: Diagnosis not present

## 2024-10-28 DIAGNOSIS — Z515 Encounter for palliative care: Secondary | ICD-10-CM | POA: Diagnosis not present

## 2024-10-28 DIAGNOSIS — N179 Acute kidney failure, unspecified: Secondary | ICD-10-CM | POA: Diagnosis not present

## 2024-10-28 DIAGNOSIS — N184 Chronic kidney disease, stage 4 (severe): Secondary | ICD-10-CM | POA: Diagnosis not present

## 2024-10-28 LAB — CBC
HCT: 23.8 % — ABNORMAL LOW (ref 36.0–46.0)
Hemoglobin: 7.3 g/dL — ABNORMAL LOW (ref 12.0–15.0)
MCH: 30.9 pg (ref 26.0–34.0)
MCHC: 30.7 g/dL (ref 30.0–36.0)
MCV: 100.8 fL — ABNORMAL HIGH (ref 80.0–100.0)
Platelets: 172 K/uL (ref 150–400)
RBC: 2.36 MIL/uL — ABNORMAL LOW (ref 3.87–5.11)
RDW: 16 % — ABNORMAL HIGH (ref 11.5–15.5)
WBC: 7.5 K/uL (ref 4.0–10.5)
nRBC: 0 % (ref 0.0–0.2)

## 2024-10-28 LAB — RENAL FUNCTION PANEL
Albumin: 2.4 g/dL — ABNORMAL LOW (ref 3.5–5.0)
Anion gap: 15 (ref 5–15)
BUN: 58 mg/dL — ABNORMAL HIGH (ref 8–23)
CO2: 23 mmol/L (ref 22–32)
Calcium: 7.7 mg/dL — ABNORMAL LOW (ref 8.9–10.3)
Chloride: 100 mmol/L (ref 98–111)
Creatinine, Ser: 6.1 mg/dL — ABNORMAL HIGH (ref 0.44–1.00)
GFR, Estimated: 6 mL/min — ABNORMAL LOW (ref >60)
Glucose, Bld: 104 mg/dL — ABNORMAL HIGH (ref 70–99)
Phosphorus: 4.6 mg/dL (ref 2.5–4.6)
Potassium: 4 mmol/L (ref 3.5–5.1)
Sodium: 138 mmol/L (ref 135–145)

## 2024-10-28 LAB — GLUCOSE, CAPILLARY
Glucose-Capillary: 114 mg/dL — ABNORMAL HIGH (ref 70–99)
Glucose-Capillary: 108 mg/dL — ABNORMAL HIGH (ref 70–99)
Glucose-Capillary: 148 mg/dL — ABNORMAL HIGH (ref 70–99)

## 2024-10-28 MED ORDER — HEPARIN SODIUM (PORCINE) 1000 UNIT/ML IJ SOLN
INTRAMUSCULAR | Status: AC
  Filled 2024-10-28: qty 1

## 2024-10-28 MED ORDER — HEPARIN SODIUM (PORCINE) 1000 UNIT/ML IJ SOLN
INTRAMUSCULAR | Status: AC
  Filled 2024-10-28: qty 3

## 2024-10-28 MED ORDER — ANTICOAGULANT SODIUM CITRATE 4% (200MG/5ML) IV SOLN: 5.0000 mL | Status: DC | PRN

## 2024-10-28 MED ORDER — HEPARIN SODIUM (PORCINE) 1000 UNIT/ML DIALYSIS: 1000.0000 [IU] | INTRAMUSCULAR | Status: DC | PRN

## 2024-10-28 MED ORDER — HEPARIN SODIUM (PORCINE) 1000 UNIT/ML IJ SOLN
2800.0000 [IU] | Freq: Once | INTRAMUSCULAR | Status: AC
  Administered 2024-10-28: 2800 [IU]

## 2024-10-28 MED ORDER — ALTEPLASE 2 MG IJ SOLR: 2.0000 mg | Freq: Once | INTRAMUSCULAR | Status: DC | PRN

## 2024-10-28 MED ORDER — OXIDIZED CELLULOSE EX PADS
4.0000 | MEDICATED_PAD | Freq: Once | CUTANEOUS | Status: AC
  Filled 2024-10-28: qty 4

## 2024-10-28 MED ORDER — LIDOCAINE HCL (PF) 1 % IJ SOLN: 5.0000 mL | INTRAMUSCULAR | Status: DC | PRN

## 2024-10-28 MED ORDER — LIDOCAINE-PRILOCAINE 2.5-2.5 % EX CREA: 1.0000 | TOPICAL_CREAM | CUTANEOUS | Status: DC | PRN

## 2024-10-28 MED ORDER — OXIDIZED CELLULOSE EX PADS
1.0000 | MEDICATED_PAD | Freq: Once | CUTANEOUS | Status: AC
  Administered 2024-10-29: 1 via TOPICAL
  Filled 2024-10-28: qty 1

## 2024-10-28 MED ORDER — PENTAFLUOROPROP-TETRAFLUOROETH EX AERO: 1.0000 | INHALATION_SPRAY | CUTANEOUS | Status: DC | PRN

## 2024-10-28 MED ORDER — HEPARIN SODIUM (PORCINE) 1000 UNIT/ML DIALYSIS: 20.0000 [IU]/kg | INTRAMUSCULAR | Status: DC | PRN

## 2024-10-28 MED ORDER — VANCOMYCIN HCL IN DEXTROSE 1-5 GM/200ML-% IV SOLN
INTRAVENOUS | Status: AC
  Filled 2024-10-28: qty 200

## 2024-10-28 NOTE — Progress Notes (Signed)
 Received patient in bed to unit.  Alert and oriented.  Informed consent signed and in chart.   TX duration:3.5   Patient tolerated well.  Transported back to the room  Alert, without acute distress.  Hand-off given to patient's nurse.   Access used: Right Femoral HD cath Access issues: Patient liked to move legs around, set machine off  Total UF removed: 1L   10/28/24 1145  Vitals  Temp 98.2 F (36.8 C)  BP (!) 168/87  Pulse Rate (!) 28  Resp 12  Oxygen Therapy  SpO2 96 %  O2 Device Room Air  Patient Activity (if Appropriate) In bed  Pulse Oximetry Type Continuous  Oximetry Probe Site Changed No  During Treatment Monitoring  HD Safety Checks Performed Yes  Intra-Hemodialysis Comments Tolerated well;Tx completed  Post Treatment  Dialyzer Clearance Lightly streaked  Liters Processed 58.8  Fluid Removed (mL) 1000 mL  Tolerated HD Treatment Yes  Hemodialysis Catheter Right Femoral vein  Placement Date/Time: 10/21/24 (c) 2100   Placed prior to admission: No  Person Inserting LDA: Deward, NP  Orientation: Right  Access Location: Femoral vein  Site Condition No complications  Blue Lumen Status Flushed;Heparin locked;Antimicrobial dead end cap  Red Lumen Status Flushed;Heparin locked;Antimicrobial dead end cap  Purple Lumen Status N/A  Catheter fill solution Heparin 1000 units/ml  Catheter fill volume (Arterial) 1.4 cc  Catheter fill volume (Venous) 1.4  Dressing Type Transparent  Dressing Status Antimicrobial disc/dressing in place;Clean, Dry, Intact  Drainage Description None  Dressing Change Due 11/04/24  Post treatment catheter status Capped and Clamped     Camellia Brasil LPN [facility]

## 2024-10-28 NOTE — Consult Note (Addendum)
 Hospital Consult    Reason for Consult:  ESRD  Referring Physician:  Dr. Raenelle MRN #:  [id number]  History of Present Illness: This is a 83 y.o. female with end-stage renal disease currently dialyzing via right femoral temporary catheter placed 1 week ago.  She is now in need of permanent dialysis access.  She is right-hand dominant and has history of right-sided breast cancer with node sampling from the right axilla and her right upper extremity is restricted.  She denies any previous surgeries of her left upper extremity or left breast.  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   Chronic kidney disease (CKD), stage IV (severe) (HCC)    Depression    Diabetes mellitus    Hypercholesteremia    Hypertension    Insomnia    Peptic ulcer    Uterine cancer (HCC)    dx in her 23s   Wears glasses     Past Surgical History:  Procedure Laterality Date   ABDOMINAL HYSTERECTOMY  1995   BACK SURGERY  2000   lumb lam   BIOPSY  05/14/2021   Procedure: BIOPSY;  Surgeon: Legrand Victory LITTIE DOUGLAS, MD;  Location: [facility] ENDOSCOPY;  Service: Gastroenterology;;   BREAST LUMPECTOMY Right 2015   COLONOSCOPY     ESOPHAGOGASTRODUODENOSCOPY N/A 05/14/2021   Procedure: ESOPHAGOGASTRODUODENOSCOPY (EGD);  Surgeon: Legrand Victory LITTIE DOUGLAS, MD;  Location: [facility] ENDOSCOPY;  Service: Gastroenterology;  Laterality: N/A;   ESOPHAGOGASTRODUODENOSCOPY (EGD) WITH PROPOFOL  N/A 05/26/2021   Procedure: ESOPHAGOGASTRODUODENOSCOPY (EGD) WITH PROPOFOL ;  Surgeon: Rollin Dover, MD;  Location: [facility] ENDOSCOPY;  Service: Endoscopy;  Laterality: N/A;   HEMOSTASIS CLIP PLACEMENT  05/26/2021   Procedure: HEMOSTASIS CLIP PLACEMENT;  Surgeon: Rollin Dover, MD;  Location: [facility] ENDOSCOPY;  Service: Endoscopy;;   HEMOSTASIS CONTROL  05/14/2021   Procedure: HEMOSTASIS CONTROL;  Surgeon: Legrand Victory LITTIE DOUGLAS, MD;  Location: [facility] ENDOSCOPY;  Service: Gastroenterology;;   HOT HEMOSTASIS N/A 05/14/2021   Procedure: HOT HEMOSTASIS (ARGON PLASMA COAGULATION/BICAP);  Surgeon: Legrand Victory LITTIE DOUGLAS, MD;  Location: [facility] ENDOSCOPY;  Service: Gastroenterology;  Laterality: N/A;   HOT HEMOSTASIS N/A 05/26/2021   Procedure: HOT HEMOSTASIS (ARGON PLASMA COAGULATION/BICAP);  Surgeon: Rollin Dover, MD;  Location: [facility] ENDOSCOPY;  Service: Endoscopy;  Laterality: N/A;   ORIF METACARPAL FRACTURE  2012   left   RADIOACTIVE SEED GUIDED PARTIAL MASTECTOMY WITH AXILLARY SENTINEL LYMPH NODE BIOPSY Right 12/17/2014   Procedure: RIGHT BREAST RADIOACTIVE SEED LOCALIZED LUMPECTOMY AND SENTINEL NODE MAPPING;  Surgeon: Deward Null III, MD;  Location: [facility];  Service: General;  Laterality: Right;   SCLEROTHERAPY  05/26/2021   Procedure: MATIAS;  Surgeon: Rollin Dover, MD;  Location: [facility] ENDOSCOPY;  Service: Endoscopy;;    Allergies  Allergen Reactions   Tape Itching and Rash    Prior to Admission medications   Medication Sig Start Date End Date Taking? Authorizing Provider  acetaminophen  (TYLENOL ) 650 MG CR tablet Take 1,300 mg by mouth every 8 (eight) hours as needed for pain.   Yes [provider]  amLODipine  (NORVASC ) 10 MG tablet Take 1 tablet (10 mg total) by mouth daily. 05/17/21  Yes Gonfa, Taye T, MD  atorvastatin  (LIPITOR) 40 MG tablet Take 40 mg by mouth daily.   Yes [provider]  calcitRIOL (ROCALTROL) 0.25 MCG capsule Take 0.25 mcg  by mouth daily. 12/22/22  Yes [provider]  Camphor-Menthol-Methyl Sal (SALONPAS EX) Apply 1 patch topically as needed (pain).   Yes [provider]  cycloSPORINE (RESTASIS) 0.05 % ophthalmic emulsion Place 1 drop into both eyes daily.   Yes [provider]  ferrous sulfate  325 (65 FE) MG tablet Take 1 tablet (325 mg total) by mouth daily with breakfast. 05/31/21  Yes Amin, Ankit C, MD  furosemide  (LASIX ) 40 MG tablet  Take 1 tablet (40 mg total) by mouth daily as needed for fluid (ankle swelling). Patient taking differently: Take 40 mg by mouth 2 (two) times daily. 05/23/21  Yes Gonfa, Taye T, MD  hydrALAZINE  (APRESOLINE ) 100 MG tablet Take 100 mg by mouth 2 (two) times daily.   Yes [provider]  metoprolol  succinate (TOPROL -XL) 25 MG 24 hr tablet Take 25 mg by mouth daily. 12/22/22  Yes [provider]  pantoprazole  (PROTONIX ) 40 MG tablet Take 40 mg by mouth daily.   Yes [provider]  senna-docusate (SENOKOT-S) 8.6-50 MG tablet Take 1 tablet by mouth at bedtime as needed for mild constipation or moderate constipation. 05/30/21  Yes Amin, Ankit C, MD  traZODone  (DESYREL ) 50 MG tablet Take 50 mg by mouth at bedtime. 01/19/23  Yes [provider]  mirtazapine  (REMERON ) 30 MG tablet Take 30 mg by mouth at bedtime. Patient not taking: Reported on 10/21/2024 04/07/21   [provider]  sertraline  (ZOLOFT ) 100 MG tablet Take 100 mg by mouth every morning. Patient not taking: Reported on 10/21/2024    [provider]    Social History   Socioeconomic History   Marital status: Single    Spouse name: Not on file   Number of children: 4   Years of education: Not on file   Highest education level: Not on file  Occupational History   Not on file  Tobacco Use   Smoking status: Former    Current packs/day: 0.00    Types: Cigarettes    Quit date: 11/12/2011    Years since quitting: 12.9   Smokeless tobacco: Former    Types: Snuff  Substance and Sexual Activity   Alcohol use: No   Drug use: No   Sexual activity: Not Currently    Birth control/protection: Post-menopausal  Other Topics Concern   Not on file  Social History Narrative   Not on file   Social Drivers of Health   Financial Resource Strain: Not on file  Food Insecurity: No Food Insecurity (10/22/2024)   Hunger Vital Sign    Worried About Running Out of Food in the Last Year: Never true     Ran Out of Food in the Last Year: Never true  Transportation Needs: No Transportation Needs (10/22/2024)   PRAPARE - Administrator, Civil Service (Medical): No    Lack of Transportation (Non-Medical): No  Physical Activity: Not on file  Stress: Not on file  Social Connections: Socially Isolated (10/22/2024)   Social Connection and Isolation Panel    Frequency of Communication with Friends and Family: More than three times a week    Frequency of Social Gatherings with Friends and Family: More than three times a week    Attends Religious Services: Never    Database Administrator or Organizations: No    Attends Banker Meetings: Never    Marital Status: Never married  Intimate Partner Violence: Not At Risk (10/24/2024)   Humiliation, Afraid, Rape, and Kick questionnaire  Fear of Current or Ex-Partner: No    Emotionally Abused: No    Physically Abused: No    Sexually Abused: No     Family History  Problem Relation Age of Onset   Cancer Mother    Stomach cancer Mother 78   Heart attack Father    Cancer Brother    Lung cancer Brother 48   Breast cancer Sister 1    Review of Systems  Constitutional: Negative.   HENT: Negative.    Eyes: Negative.   Respiratory: Negative.    Cardiovascular: Negative.   Gastrointestinal: Negative.   Musculoskeletal: Negative.   Skin: Negative.   Neurological: Negative.   Endo/Heme/Allergies: Negative.   Psychiatric/Behavioral: Negative.        Physical Examination  Vitals:   10/28/24 1151 10/28/24 1244  BP: (!) 176/78 (!) 139/100  Pulse: (!) 108 67  Resp: 12 18  Temp:    SpO2: 100% 100%   Body mass index is 18.82 kg/m.  Physical Exam Cardiovascular:     Rate and Rhythm: Normal rate.     Comments: 2+ bilateral brachial pulses Chest:     Chest wall: No mass.  Musculoskeletal:     Cervical back: Normal range of motion.  Skin:    General: Skin is dry.     Capillary Refill: Capillary refill takes less  than 2 seconds.     Comments: There is an area on the lateral forearm concerning for thrombophlebitis  Neurological:     General: No focal deficit present.     Mental Status: She is alert.      CBC    Component Value Date/Time   WBC 7.5 10/28/2024 0815   RBC 2.36 (L) 10/28/2024 0815   HGB 7.3 (L) 10/28/2024 0815   HGB 9.6 (L) 02/15/2023 0946   HGB 10.8 (L) 11/15/2017 0759   HCT 23.8 (L) 10/28/2024 0815   HCT 33.1 (L) 11/15/2017 0759   PLT 172 10/28/2024 0815   PLT 253 02/15/2023 0946   PLT 196 11/15/2017 0759   MCV 100.8 (H) 10/28/2024 0815   MCV 90.8 11/15/2017 0759   MCH 30.9 10/28/2024 0815   MCHC 30.7 10/28/2024 0815   RDW 16.0 (H) 10/28/2024 0815   RDW 14.2 11/15/2017 0759   LYMPHSABS 0.4 (L) 10/21/2024 0817   LYMPHSABS 2.0 11/15/2017 0759   MONOABS 0.7 10/21/2024 0817   MONOABS 0.9 11/15/2017 0759   EOSABS 0.0 10/21/2024 0817   EOSABS 0.2 11/15/2017 0759   BASOSABS 0.0 10/21/2024 0817   BASOSABS 0.1 11/15/2017 0759    BMET    Component Value Date/Time   NA 138 10/28/2024 0238   NA 141 11/15/2017 0759   K 4.0 10/28/2024 0238   K 4.4 11/15/2017 0759   CL 100 10/28/2024 0238   CO2 23 10/28/2024 0238   CO2 26 11/15/2017 0759   GLUCOSE 104 (H) 10/28/2024 0238   GLUCOSE 176 (H) 11/15/2017 0759   BUN 58 (H) 10/28/2024 0238   BUN 29.8 (H) 11/15/2017 0759   CREATININE 6.10 (H) 10/28/2024 0238   CREATININE 4.38 (H) 02/27/2023 0918   CREATININE 1.1 11/15/2017 0759   CALCIUM  7.7 (L) 10/28/2024 0238   CALCIUM  10.6 (H) 11/15/2017 0759   GFRNONAA 6 (L) 10/28/2024 0238   GFRNONAA 11 (L) 02/15/2023 0946   GFRNONAA 17 (L) 01/27/2021 0950   GFRAA 20 (L) 01/27/2021 0950    COAGS: Lab Results  Component Value Date   INR 1.2 01/26/2023   INR 1.2 05/13/2021  Non-Invasive Vascular Imaging:   +-----------------+-------------+----------+---------+  Right Cephalic   Diameter (cm)Depth (cm)Findings   +-----------------+-------------+----------+---------+   Shoulder            0.32                          +-----------------+-------------+----------+---------+  Prox upper arm       0.22                          +-----------------+-------------+----------+---------+  Mid upper arm        0.24               branching  +-----------------+-------------+----------+---------+  Dist upper arm       0.17                          +-----------------+-------------+----------+---------+  Antecubital fossa    0.20                          +-----------------+-------------+----------+---------+  Prox forearm         0.31               branching  +-----------------+-------------+----------+---------+  Mid forearm          0.17                          +-----------------+-------------+----------+---------+  Dist forearm         0.10               branching  +-----------------+-------------+----------+---------+  Wrist               0.10                          +-----------------+-------------+----------+---------+   +-----------------+-------------+----------+--------------+  Right Basilic    Diameter (cm)Depth (cm)   Findings     +-----------------+-------------+----------+--------------+  Prox upper arm       0.38        0.76                   +-----------------+-------------+----------+--------------+  Mid upper arm        0.24        0.78                   +-----------------+-------------+----------+--------------+  Dist upper arm       0.25        0.76                   +-----------------+-------------+----------+--------------+  Antecubital fossa    0.22        0.33     branching     +-----------------+-------------+----------+--------------+  Prox forearm         0.11        0.16                   +-----------------+-------------+----------+--------------+  Mid forearm          0.10        0.17     branching      +-----------------+-------------+----------+--------------+  Distal forearm       0.07        0.19                   +-----------------+-------------+----------+--------------+  Wrist                                  not visualized  +-----------------+-------------+----------+--------------+   +-----------------+-------------+----------+---------+  Left Cephalic    Diameter (cm)Depth (cm)Findings   +-----------------+-------------+----------+---------+  Shoulder            0.28                          +-----------------+-------------+----------+---------+  Prox upper arm       0.27                          +-----------------+-------------+----------+---------+  Mid upper arm        0.25                          +-----------------+-------------+----------+---------+  Dist upper arm       0.27               branching  +-----------------+-------------+----------+---------+  Antecubital fossa    0.37                          +-----------------+-------------+----------+---------+  Prox forearm         0.35               branching  +-----------------+-------------+----------+---------+  Mid forearm          0.22               branching  +-----------------+-------------+----------+---------+  Dist forearm         0.17                          +-----------------+-------------+----------+---------+  Wrist               0.13                          +-----------------+-------------+----------+---------+   +-----------------+-------------+----------+---------+  Left Basilic     Diameter (cm)Depth (cm)Findings   +-----------------+-------------+----------+---------+  Prox upper arm     0.38/0.22  0.75/0.56 branching  +-----------------+-------------+----------+---------+  Mid upper arm        0.31        0.69              +-----------------+-------------+----------+---------+  Dist upper arm       0.24        0.54               +-----------------+-------------+----------+---------+  Antecubital fossa    0.28        0.44   branching  +-----------------+-------------+----------+---------+  Prox forearm         0.20        0.28              +-----------------+-------------+----------+---------+  Mid forearm          0.20        0.43              +-----------------+-------------+----------+---------+  Distal forearm       0.20        0.21              +-----------------+-------------+----------+---------+  Wrist  0.16        0.21              +-----------------+-------------+----------+---------+     ASSESSMENT/PLAN: This is a 83 y.o. female now with end-stage renal disease in need of permanent access as well as tunneled dialysis catheter.  Will plan left upper extremity AV fistula versus graft and TDC this coming Thursday.  I discussed the risk benefits with both the patient and her daughter via telephone they demonstrate good understanding.   Miranda Garber C. Sheree, MD [facility] Office: [phone] Pager: [phone]

## 2024-10-28 NOTE — Progress Notes (Signed)
 PROGRESS NOTE        PATIENT DETAILS Name: Erica Whitney Age: 83 y.o. Sex: female Date of Birth: Jun 14, 1941 Admit Date: 10/21/2024 Admitting Physician Harden Jude GAILS, MD PCP:[facility]  Brief Summary: Patient is a 83 y.o.  female with history of CKD 5-who was brought to the ED with shortness of breath/confusion/uncontrolled hypertension-she was found to have acute pulmonary edema in the setting of progression to ESRD.  She was initially admitted to the hospitalist service but became agitated-ripped off BiPAP-had uncontrolled hypertension-requiring initiation of nitroglycerin drip-and transferred to the ICU.  Per nephrology team-patient had not wanted dialysis as an outpatient-however patient was not able to make her own decisions due to encephalopathy-family elected to start hemodialysis.  She subsequently improved-and was transferred to [facility] on 11/7.  Significant events: 11/4>> admit to [facility]-but transferred to ICU due to agitation-nitroglycerin drip 11/5>> started on HD 11/7>> transferred to [facility].  Significant studies: 11/4>> chest x-ray: Findings compatible with interstitial edema 11/4>> echo: EF 45-50%,+ wall motion abnormality, moderately elevated pulmonary artery systolic pressure.  Severe MR, moderate TR  Significant microbiology data: 11/4>> COVID/influenza/RSV PCR: Negative  Procedures: 11/4>> temporary right groin dialysis catheter by PCCM.  Consults: Nephrology cardiology PCCM Palliative care  Subjective: No major issues overnight-lying comfortably in bed-about to be taken to HD when I saw her earlier this morning.  No family at bedside.  Objective: Vitals: Blood pressure (!) 165/82, pulse 70, temperature 98 F (36.7 C), resp. rate (!) 25, height 5' 3 (1.6 m), weight 49.2 kg, SpO2 100%.   Exam: Sleepy but awake/alert Chest: Clear to auscultation CVS: S1-S2 regular Abdomen: Soft nontender nondistended Extremities: No  edema Neurology: Nonfocal but with generalized weakness  Pertinent Labs/Radiology:    Latest Ref Rng & Units 10/28/2024    8:15 AM 10/27/2024    3:48 AM 10/25/2024    4:21 AM  CBC  WBC 4.0 - 10.5 K/uL 7.5  7.7  5.5   Hemoglobin 12.0 - 15.0 g/dL 7.3  7.6  7.1   Hematocrit 36.0 - 46.0 % 23.8  24.0  21.8   Platelets 150 - 400 K/uL 172  132  88     Lab Results  Component Value Date   NA 138 10/28/2024   K 4.0 10/28/2024   CL 100 10/28/2024   CO2 23 10/28/2024      Assessment/Plan: Acute hypoxic respiratory failure secondary to acute pulmonary edema in the setting of CKD 5 with 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  Nephrology/palliative care engaging family/patient-after extra discussion-plans are now to pursue [facility] catheter/AV fistula-nephrology will consult vascular surgery.  Acute metabolic encephalopathy Secondary to uremia Although encephalopathy markedly better-slow to respond but does respond appropriately Delirium precautions.  Hypertensive crisis Required ICU transfer nitroglycerin infusion Blood pressure now much better after HD Continue amlodipine , metoprolol , BiDil  Elevated troponin Doubt type I non-STEMI Suspect related to demand ischemia/type II non-STEMI. Echo as above with mildly suppressed EF and some regional wall motion abnormality Cardiology ongoing-medically managed at this point.  Normocytic anemia Secondary to critical illness and anemia related to ESRD Defer Aranesp/iron  therapies to nephrology service  Thrombocytopenia Mild Suspect related to uremia Hopefully will improve with supportive care-continue  to follow trend  Hypokalemia Repleted-recheck periodically  DM-2  CBG stable SSI  Recent Labs    10/27/24 1214  10/27/24 1606 10/27/24 2039  GLUCAP 113* 172* 175*     History of depression Apparently no longer taking Zoloft  or Remeron  per home MAR. Supportive care  Insomnia Trazodone   Code status:   Code Status: Limited: Do not attempt resuscitation (DNR) -DNR-LIMITED -Do Not Intubate/DNI    DVT Prophylaxis: Place and maintain sequential compression device Start: 10/22/24 0949 SCDs Start: 10/21/24 1948   Family Communication: Daughter at bedside 11/9   Disposition Plan: Status is: Inpatient Remains inpatient appropriate because: Severity of illness   Planned Discharge Destination:Home health versus SNF   Diet: Diet Order             Diet renal with fluid restriction Fluid restriction: 1500 mL Fluid; Room service appropriate? Yes; Fluid consistency: Thin  Diet effective now                     Antimicrobial agents: Anti-infectives (From admission, onward)    Start     Dose/Rate Route Frequency Ordered Stop   10/23/24 1100  doxycycline (VIBRA-TABS) tablet 100 mg        100 mg Oral Every 12 hours 10/23/24 1005 10/27/24 2136        MEDICATIONS: Scheduled Meds:  amLODipine   10 mg Oral Daily   aspirin EC  81 mg Oral Daily   atorvastatin   80 mg Oral Daily   calcitRIOL  0.25 mcg Oral Daily   Chlorhexidine  Gluconate Cloth  6 each Topical Q0600   Chlorhexidine  Gluconate Cloth  6 each Topical Q0600   Chlorhexidine  Gluconate Cloth  6 each Topical Q0600   cycloSPORINE  1 drop Both Eyes Daily   [START ON 10/31/2024] darbepoetin (ARANESP) injection - DIALYSIS  100 mcg Subcutaneous Q Fri-1800   feeding supplement (NEPRO CARB STEADY)  237 mL Oral BID BM   ferrous sulfate   325 mg Oral Q breakfast   heparin sodium (porcine)  2,800 Units Intracatheter Once   insulin  aspart  0-5 Units Subcutaneous QHS   insulin  aspart  0-9 Units Subcutaneous TID WC   isosorbide-hydrALAZINE   2 tablet Oral TID   metoprolol  succinate  25 mg Oral Daily   pantoprazole   40 mg Oral Daily   sodium  chloride flush  3 mL Intravenous Q12H   Continuous Infusions:  anticoagulant sodium citrate      PRN Meds:.acetaminophen  **OR** acetaminophen , albuterol , alteplase, anticoagulant sodium citrate, artificial tears, fentaNYL  (SUBLIMAZE ) injection, heparin, heparin, labetalol , lidocaine  (PF), lidocaine -prilocaine, pentafluoroprop-tetrafluoroeth, senna-docusate, traZODone    I have personally reviewed following labs and imaging studies  LABORATORY DATA: CBC: Recent Labs  Lab 10/23/24 0324 10/24/24 0342 10/25/24 0421 10/27/24 0348 10/28/24 0815  WBC 7.9 5.8 5.5 7.7 7.5  HGB 7.5* 7.2* 7.1* 7.6* 7.3*  HCT 22.9* 22.0* 21.8* 24.0* 23.8*  MCV 95.4 94.4 95.6 97.6 100.8*  PLT 61* 70* 88* 132* 172    Basic Metabolic Panel: Recent Labs  Lab 10/22/24 0248 10/22/24 0249 10/23/24 0324 10/24/24 0342 10/25/24 0421 10/26/24 0412 10/27/24 0348 10/28/24 0238  NA 146*   < > 142 136 135 133* 138 138  K 4.2   < > 3.7 3.1* 3.2* 3.6 3.7 4.0  CL 116*   < > 108 100 100 99 101 100  CO2 10*   < > 17* 21* 20* 25 24 23   GLUCOSE 136*   < > 83 81 116* 114* 119* 104*  BUN  145*   < > 108* 65* 81* 43* 54* 58*  CREATININE 10.29*   < > 8.04* 5.65* 6.56* 4.43* 5.33* 6.10*  CALCIUM  8.1*   < > 7.8* 7.4* 7.6* 7.7* 8.1* 7.7*  MG 1.8  --  1.7  --   --   --   --   --   PHOS 11.5*   < > 8.0* 4.9* 5.3* 3.7 4.1 4.6   < > = values in this interval not displayed.    GFR: Estimated Creatinine Clearance: 5.6 mL/min (A) (by C-G formula based on SCr of 6.1 mg/dL (H)).  Liver Function Tests: Recent Labs  Lab 10/24/24 0342 10/25/24 0421 10/26/24 0412 10/27/24 0348 10/28/24 0238  ALBUMIN 2.3* 2.2* 2.2* 2.3* 2.4*   No results for input(s): LIPASE, AMYLASE in the last 168 hours. No results for input(s): AMMONIA in the last 168 hours.  Coagulation Profile: No results for input(s): INR, PROTIME in the last 168 hours.  Cardiac Enzymes: No results for input(s): CKTOTAL, CKMB, CKMBINDEX,  TROPONINI in the last 168 hours.  BNP (last 3 results) No results for input(s): PROBNP in the last 8760 hours.  Lipid Profile: No results for input(s): CHOL, HDL, LDLCALC, TRIG, CHOLHDL, LDLDIRECT in the last 72 hours.  Thyroid  Function Tests: No results for input(s): TSH, T4TOTAL, FREET4, T3FREE, THYROIDAB in the last 72 hours.  Anemia Panel: No results for input(s): VITAMINB12, FOLATE, FERRITIN, TIBC, IRON , RETICCTPCT in the last 72 hours.   Urine analysis:    Component Value Date/Time   COLORURINE YELLOW 01/26/2023 0815   APPEARANCEUR CLEAR 01/26/2023 0815   LABSPEC 1.011 01/26/2023 0815   PHURINE 5.0 01/26/2023 0815   GLUCOSEU NEGATIVE 01/26/2023 0815   HGBUR NEGATIVE 01/26/2023 0815   BILIRUBINUR NEGATIVE 01/26/2023 0815   KETONESUR NEGATIVE 01/26/2023 0815   PROTEINUR 100 (A) 01/26/2023 0815   UROBILINOGEN 0.2 01/30/2012 1215   NITRITE NEGATIVE 01/26/2023 0815   LEUKOCYTESUR NEGATIVE 01/26/2023 0815    Sepsis Labs: Lactic Acid, Venous    Component Value Date/Time   LATICACIDVEN 0.7 01/26/2023 0556    MICROBIOLOGY: Recent Results (from the past 240 hours)  Resp panel by RT-PCR (RSV, Flu A&B, Covid) Anterior Nasal Swab     Status: None   Collection Time: 10/21/24  8:06 AM   Specimen: Anterior Nasal Swab  Result Value Ref Range Status   SARS Coronavirus 2 by RT PCR NEGATIVE NEGATIVE Final   Influenza A by PCR NEGATIVE NEGATIVE Final   Influenza B by PCR NEGATIVE NEGATIVE Final    Comment: (NOTE) The Xpert Xpress SARS-CoV-2/FLU/RSV plus assay is intended as an aid in the diagnosis of influenza from Nasopharyngeal swab specimens and should not be used as a sole basis for treatment. Nasal washings and aspirates are unacceptable for Xpert Xpress SARS-CoV-2/FLU/RSV testing.  Fact Sheet for Patients: bloggercourse.com  Fact Sheet for Healthcare  Providers: seriousbroker.it  This test is not yet approved or cleared by the [place]  FDA and has been authorized for detection and/or diagnosis of SARS-CoV-2 by FDA under an Emergency Use Authorization (EUA). This EUA will remain in effect (meaning this test can be used) for the duration of the COVID-19 declaration under Section 564(b)(1) of the Act, 21 U.S.C. section 360bbb-3(b)(1), unless the authorization is terminated or revoked.     Resp Syncytial Virus by PCR NEGATIVE NEGATIVE Final    Comment: (NOTE) Fact Sheet for Patients: bloggercourse.com  Fact Sheet for Healthcare Providers: seriousbroker.it  This test is not yet approved or cleared by  the [place]  FDA and has been authorized for detection and/or diagnosis of SARS-CoV-2 by FDA under an Emergency Use Authorization (EUA). This EUA will remain in effect (meaning this test can be used) for the duration of the COVID-19 declaration under Section 564(b)(1) of the Act, 21 U.S.C. section 360bbb-3(b)(1), unless the authorization is terminated or revoked.  Performed at [facility], [address], [place], [place] [address]   MRSA Next Gen by PCR, Nasal     Status: None   Collection Time: 10/21/24  7:47 PM   Specimen: Nasal Mucosa; Nasal Swab  Result Value Ref Range Status   MRSA by PCR Next Gen NOT DETECTED NOT DETECTED Final    Comment: (NOTE) The GeneXpert MRSA Assay (FDA approved for NASAL specimens only), is one component of a comprehensive MRSA colonization surveillance program. It is not intended to diagnose MRSA infection nor to guide or monitor treatment for MRSA infections. Test performance is not FDA approved in patients less than 83 years old. Performed at [facility], [address], [place], [place] [address]     RADIOLOGY STUDIES/RESULTS: VAS US  UPPER EXT VEIN MAPPING (PRE-OP AVF) Result Date:  10/27/2024 UPPER EXTREMITY VEIN MAPPING Patient Name:  RYLIN SAEZ Mokry  Date of Exam:   10/27/2024 Medical Rec #: [id number]           Accession #:    [id number] Date of Birth: [birth year]          Patient Gender: F Patient Age:   74 years Exam Location:  [facility] Procedure:      VAS US  UPPER EXT VEIN MAPPING (PRE-OP AVF) Referring Phys: KATHERYN SABA --------------------------------------------------------------------------------  Indications: Pre-access. History: ESRD need for HD access.  Limitations: patient movement and body habitus Comparison Study: No previous exams Performing Technologist: Jody Hill RVT, RDMS  Examination Guidelines: A complete evaluation includes B-mode imaging, spectral Doppler, color Doppler, and power Doppler as needed of all accessible portions of each vessel. Bilateral testing is considered an integral part of a complete examination. Limited examinations for reoccurring indications may be performed as noted. +-----------------+-------------+----------+---------+ Right Cephalic   Diameter (cm)Depth (cm)Findings  +-----------------+-------------+----------+---------+ Shoulder             0.32                         +-----------------+-------------+----------+---------+ Prox upper arm       0.22                         +-----------------+-------------+----------+---------+ Mid upper arm        0.24               branching +-----------------+-------------+----------+---------+ Dist upper arm       0.17                         +-----------------+-------------+----------+---------+ Antecubital fossa    0.20                         +-----------------+-------------+----------+---------+ Prox forearm         0.31               branching +-----------------+-------------+----------+---------+ Mid forearm          0.17                         +-----------------+-------------+----------+---------+  Dist forearm         0.10                branching +-----------------+-------------+----------+---------+ Wrist                0.10                         +-----------------+-------------+----------+---------+ +-----------------+-------------+----------+--------------+ Right Basilic    Diameter (cm)Depth (cm)   Findings    +-----------------+-------------+----------+--------------+ Prox upper arm       0.38        0.76                  +-----------------+-------------+----------+--------------+ Mid upper arm        0.24        0.78                  +-----------------+-------------+----------+--------------+ Dist upper arm       0.25        0.76                  +-----------------+-------------+----------+--------------+ Antecubital fossa    0.22        0.33     branching    +-----------------+-------------+----------+--------------+ Prox forearm         0.11        0.16                  +-----------------+-------------+----------+--------------+ Mid forearm          0.10        0.17     branching    +-----------------+-------------+----------+--------------+ Distal forearm       0.07        0.19                  +-----------------+-------------+----------+--------------+ Wrist                                   not visualized +-----------------+-------------+----------+--------------+ +-----------------+-------------+----------+---------+ Left Cephalic    Diameter (cm)Depth (cm)Findings  +-----------------+-------------+----------+---------+ Shoulder             0.28                         +-----------------+-------------+----------+---------+ Prox upper arm       0.27                         +-----------------+-------------+----------+---------+ Mid upper arm        0.25                         +-----------------+-------------+----------+---------+ Dist upper arm       0.27               branching +-----------------+-------------+----------+---------+ Antecubital fossa    0.37                          +-----------------+-------------+----------+---------+ Prox forearm         0.35               branching +-----------------+-------------+----------+---------+ Mid forearm          0.22               branching +-----------------+-------------+----------+---------+ Dist forearm  0.17                         +-----------------+-------------+----------+---------+ Wrist                0.13                         +-----------------+-------------+----------+---------+ +-----------------+-------------+----------+---------+ Left Basilic     Diameter (cm)Depth (cm)Findings  +-----------------+-------------+----------+---------+ Prox upper arm     0.38/0.22  0.75/0.56 branching +-----------------+-------------+----------+---------+ Mid upper arm        0.31        0.69             +-----------------+-------------+----------+---------+ Dist upper arm       0.24        0.54             +-----------------+-------------+----------+---------+ Antecubital fossa    0.28        0.44   branching +-----------------+-------------+----------+---------+ Prox forearm         0.20        0.28             +-----------------+-------------+----------+---------+ Mid forearm          0.20        0.43             +-----------------+-------------+----------+---------+ Distal forearm       0.20        0.21             +-----------------+-------------+----------+---------+ Wrist                0.16        0.21             +-----------------+-------------+----------+---------+ *See table(s) above for measurements and observations.  Diagnosing physician: Fonda Rim Electronically signed by Fonda Rim on 10/27/2024 at 3:13:57 PM.    Final       LOS: 7 days   Donalda Applebaum, MD  [facility]    To contact the attending provider between 7A-7P or the covering provider during after hours 7P-7A, please log into the web site  [email or web address] and access using universal [employer] password for that web site. If you do not have the password, please call the hospital operator.  10/28/2024, 10:54 AM

## 2024-10-28 NOTE — Plan of Care (Signed)

## 2024-10-28 NOTE — Progress Notes (Signed)
 [facility] NEPHROLOGY PROGRESS NOTE  Assessment/ Plan: Pt is a 83 y.o. yo female with new ESRD on dialysis.  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  The first HD was on 11/5 and has been tolerating well.  On and off issue with femoral HD line mainly related with her movement.  Consulting vascular surgeon for tunneled HD catheter placement and for permanent access.  I will order vein mapping.  Noted patient has OP HD arranged at [facility] schedule.  Discussed with the primary team.  # Hypertension/volume: Monitor BP, UF with HD.  # Anemia of CKD: Continue weekly Aranesp, monitor hemoglobin.  # CHF, optimize volume with dialysis, fluid restriction.  # CKD-MBD: Corrected calcium  level and phosphorus level acceptable.  Also on VDRA.  Subjective: Seen and examined.  She had problem with the femoral catheter yesterday therefore unable to receive dialysis.  She is at HD unit, intermittent problem with the catheter flow because of her movement.  Discussed with the dialysis nurse.  No nausea, vomiting, chest pain.  Objective Vital signs in last 24 hours: Vitals:   10/28/24 1030 10/28/24 1100 10/28/24 1130 10/28/24 1145  BP: (!) 165/82 (!) 160/76 (!) 195/139 (!) 168/87  Pulse: 70 (!) 49 (!) 45 (!) 28  Resp: (!) 25 12 (!) 26   Temp:      TempSrc:      SpO2: 100% 100% 100% 96%  Weight:      Height:       Weight change: -0.1 kg  Intake/Output Summary (Last 24 hours) at 10/28/2024 1145 Last data filed at 10/27/2024 2137 Gross per 24 hour  Intake 3 ml  Output --  Net 3 ml       Labs: RENAL PANEL Recent Labs  Lab 10/22/24 0248 10/22/24 0249 10/23/24 0324 10/24/24 0342 10/25/24 0421 10/26/24 0412 10/27/24 0348 10/28/24 0238  NA 146*   < > 142 136 135 133* 138 138  K 4.2   < > 3.7 3.1* 3.2* 3.6 3.7 4.0  CL 116*   < > 108 100 100 99 101 100  CO2 10*   < > 17* 21* 20* 25 24 23   GLUCOSE 136*   < > 83  81 116* 114* 119* 104*  BUN 145*   < > 108* 65* 81* 43* 54* 58*  CREATININE 10.29*   < > 8.04* 5.65* 6.56* 4.43* 5.33* 6.10*  CALCIUM  8.1*   < > 7.8* 7.4* 7.6* 7.7* 8.1* 7.7*  MG 1.8  --  1.7  --   --   --   --   --   PHOS 11.5*   < > 8.0* 4.9* 5.3* 3.7 4.1 4.6  ALBUMIN  --    < > 2.5* 2.3* 2.2* 2.2* 2.3* 2.4*   < > = values in this interval not displayed.    Liver Function Tests: Recent Labs  Lab 10/26/24 0412 10/27/24 0348 10/28/24 0238  ALBUMIN 2.2* 2.3* 2.4*   No results for input(s): LIPASE, AMYLASE in the last 168 hours. No results for input(s): AMMONIA in the last 168 hours. CBC: Recent Labs    07/02/24 0819 07/02/24 9177 08/13/24 0830 08/27/24 9177 09/10/24 0809 09/10/24 9188 10/08/24 0854 10/08/24 0906 10/22/24 1336 10/23/24 0324 10/24/24 0342 10/25/24 0421 10/27/24 0348 10/28/24 0815  HGB  --    < >  --    < >  --    < >  --    < >  --  7.5* 7.2* 7.1* 7.6* 7.3*  MCV  --   --   --   --   --   --   --    < >  --  95.4 94.4 95.6 97.6 100.8*  FERRITIN 226  --  192  --  192  --  281  --  273  --   --   --   --   --   TIBC 188*  --  189*  --  209*  --  185*  --  140*  --   --   --   --   --   IRON  40  --  64  --  81  --  90  --  18*  --   --   --   --   --    < > = values in this interval not displayed.    Cardiac Enzymes: No results for input(s): CKTOTAL, CKMB, CKMBINDEX, TROPONINI in the last 168 hours. CBG: Recent Labs  Lab 10/26/24 2058 10/27/24 0936 10/27/24 1214 10/27/24 1606 10/27/24 2039  GLUCAP 86 112* 113* 172* 175*    Iron  Studies: No results for input(s): IRON , TIBC, TRANSFERRIN, FERRITIN in the last 72 hours. Studies/Results: VAS US  UPPER EXT VEIN MAPPING (PRE-OP AVF) Result Date: 10/27/2024 UPPER EXTREMITY VEIN MAPPING Patient Name:  Erica Whitney  Date of Exam:   10/27/2024 Medical Rec #: [id number]           Accession #:    [id number] Date of Birth: 1941-11-16          Patient Gender: F Patient Age:   75 years  Exam Location:  [facility] Procedure:      VAS US  UPPER EXT VEIN MAPPING (PRE-OP AVF) Referring Phys: KATHERYN SABA --------------------------------------------------------------------------------  Indications: Pre-access. History: ESRD need for HD access.  Limitations: patient movement and body habitus Comparison Study: No previous exams Performing Technologist: Jody Hill RVT, RDMS  Examination Guidelines: A complete evaluation includes B-mode imaging, spectral Doppler, color Doppler, and power Doppler as needed of all accessible portions of each vessel. Bilateral testing is considered an integral part of a complete examination. Limited examinations for reoccurring indications may be performed as noted. +-----------------+-------------+----------+---------+ Right Cephalic   Diameter (cm)Depth (cm)Findings  +-----------------+-------------+----------+---------+ Shoulder             0.32                         +-----------------+-------------+----------+---------+ Prox upper arm       0.22                         +-----------------+-------------+----------+---------+ Mid upper arm        0.24               branching +-----------------+-------------+----------+---------+ Dist upper arm       0.17                         +-----------------+-------------+----------+---------+ Antecubital fossa    0.20                         +-----------------+-------------+----------+---------+ Prox forearm         0.31               branching +-----------------+-------------+----------+---------+ Mid forearm          0.17                         +-----------------+-------------+----------+---------+  Dist forearm         0.10               branching +-----------------+-------------+----------+---------+ Wrist                0.10                         +-----------------+-------------+----------+---------+ +-----------------+-------------+----------+--------------+ Right  Basilic    Diameter (cm)Depth (cm)   Findings    +-----------------+-------------+----------+--------------+ Prox upper arm       0.38        0.76                  +-----------------+-------------+----------+--------------+ Mid upper arm        0.24        0.78                  +-----------------+-------------+----------+--------------+ Dist upper arm       0.25        0.76                  +-----------------+-------------+----------+--------------+ Antecubital fossa    0.22        0.33     branching    +-----------------+-------------+----------+--------------+ Prox forearm         0.11        0.16                  +-----------------+-------------+----------+--------------+ Mid forearm          0.10        0.17     branching    +-----------------+-------------+----------+--------------+ Distal forearm       0.07        0.19                  +-----------------+-------------+----------+--------------+ Wrist                                   not visualized +-----------------+-------------+----------+--------------+ +-----------------+-------------+----------+---------+ Left Cephalic    Diameter (cm)Depth (cm)Findings  +-----------------+-------------+----------+---------+ Shoulder             0.28                         +-----------------+-------------+----------+---------+ Prox upper arm       0.27                         +-----------------+-------------+----------+---------+ Mid upper arm        0.25                         +-----------------+-------------+----------+---------+ Dist upper arm       0.27               branching +-----------------+-------------+----------+---------+ Antecubital fossa    0.37                         +-----------------+-------------+----------+---------+ Prox forearm         0.35               branching +-----------------+-------------+----------+---------+ Mid forearm          0.22                branching +-----------------+-------------+----------+---------+ Dist forearm  0.17                         +-----------------+-------------+----------+---------+ Wrist                0.13                         +-----------------+-------------+----------+---------+ +-----------------+-------------+----------+---------+ Left Basilic     Diameter (cm)Depth (cm)Findings  +-----------------+-------------+----------+---------+ Prox upper arm     0.38/0.22  0.75/0.56 branching +-----------------+-------------+----------+---------+ Mid upper arm        0.31        0.69             +-----------------+-------------+----------+---------+ Dist upper arm       0.24        0.54             +-----------------+-------------+----------+---------+ Antecubital fossa    0.28        0.44   branching +-----------------+-------------+----------+---------+ Prox forearm         0.20        0.28             +-----------------+-------------+----------+---------+ Mid forearm          0.20        0.43             +-----------------+-------------+----------+---------+ Distal forearm       0.20        0.21             +-----------------+-------------+----------+---------+ Wrist                0.16        0.21             +-----------------+-------------+----------+---------+ *See table(s) above for measurements and observations.  Diagnosing physician: Fonda Rim Electronically signed by Fonda Rim on 10/27/2024 at 3:13:57 PM.    Final     Medications: Infusions:  anticoagulant sodium citrate      Scheduled Medications:  amLODipine   10 mg Oral Daily   aspirin EC  81 mg Oral Daily   atorvastatin   80 mg Oral Daily   calcitRIOL  0.25 mcg Oral Daily   Chlorhexidine  Gluconate Cloth  6 each Topical Q0600   Chlorhexidine  Gluconate Cloth  6 each Topical Q0600   Chlorhexidine  Gluconate Cloth  6 each Topical Q0600   cycloSPORINE  1 drop Both Eyes Daily   [START ON  10/31/2024] darbepoetin (ARANESP) injection - DIALYSIS  100 mcg Subcutaneous Q Fri-1800   feeding supplement (NEPRO CARB STEADY)  237 mL Oral BID BM   ferrous sulfate   325 mg Oral Q breakfast   insulin  aspart  0-5 Units Subcutaneous QHS   insulin  aspart  0-9 Units Subcutaneous TID WC   isosorbide-hydrALAZINE   2 tablet Oral TID   metoprolol  succinate  25 mg Oral Daily   pantoprazole   40 mg Oral Daily   sodium chloride  flush  3 mL Intravenous Q12H    have reviewed scheduled and prn medications.  Physical Exam: General:NAD, comfortable Heart:RRR, s1s2 nl Lungs:clear b/l, no crackle Abdomen:soft, Non-tender, non-distended Extremities:No edema Dialysis Access: Femoral groin catheter , no sign of active bleeding.  Jamyria Ozanich Prasad Lundyn Coste 10/28/2024,11:45 AM  LOS: 7 days

## 2024-10-28 NOTE — Progress Notes (Signed)
   10/28/24 2335  BiPAP/CPAP/SIPAP  Reason BIPAP/CPAP not in use (S)  Other(comment) (pt refused, states she doesn't wear and does not wish to. No equip in room)

## 2024-10-28 NOTE — Progress Notes (Signed)
 Initial Nutrition Assessment  DOCUMENTATION CODES:   Severe malnutrition in context of chronic illness  INTERVENTION:  Continue renal diet with 1.5 L fluid restriction. If K and Phos remain WNL, consider liberalizing diet to regular diet to encourage PO intake. Continue Nepro BID. Each supplement provides 425 Kcals and 19 grams protein. Diet education for dialysis diet provided in discharge instructions.   NUTRITION DIAGNOSIS:   Severe Malnutrition related to chronic illness as evidenced by percent weight loss, severe muscle depletion, moderate fat depletion.   GOAL:   Patient will meet greater than or equal to 90% of their needs   MONITOR:   PO intake, Supplement acceptance, Labs, Weight trends  REASON FOR ASSESSMENT:   Consult Assessment of nutrition requirement/status, Diet education  ASSESSMENT:   Patient presented with shortness of breath, confusion/agitation, and uncontrolled hypertension and was found to have acute pulmonary edema 2/2 ESRD requiring initiation of HD which she refused but decision was made by family to proceed since the patient was unable to make her own decisions due to encephalopathy. PMH significant for DM2, HTN, dyslipidemia, CKD5, depression, remote diagnoses of breast and uterine cancers.  11/04 - transferred to ICU due to agitation and need for nitroglycerin drip, temp HD cath placed 11/05 - HD initiated  The patient is s/p palliative eval and would like to continue with HD for now. Visited the patient who is very drowsy and only intermittently able to answer questions appropriately so credibility of her answers are limited. She tells me she has a good appetite and was eating well prior to admission. She is unaware of what her UBW is but feels like she has lost weight.  Typical day's intake: Breakfast - eggs, and then patient was unable to list more foods Lunch - soup, and then patient was unable to list more foods Dinner - salmon salad ONS -  she tells me she drinks them at home but was unable to provide more details  Scheduled Meds:  amLODipine   10 mg Oral Daily   aspirin EC  81 mg Oral Daily   atorvastatin   80 mg Oral Daily   calcitRIOL  0.25 mcg Oral Daily   Chlorhexidine  Gluconate Cloth  6 each Topical Q0600   Chlorhexidine  Gluconate Cloth  6 each Topical Q0600   Chlorhexidine  Gluconate Cloth  6 each Topical Q0600   cycloSPORINE  1 drop Both Eyes Daily   [START ON 10/31/2024] darbepoetin (ARANESP) injection - DIALYSIS  100 mcg Subcutaneous Q Fri-1800   feeding supplement (NEPRO CARB STEADY)  237 mL Oral BID BM   ferrous sulfate   325 mg Oral Q breakfast   insulin  aspart  0-5 Units Subcutaneous QHS   insulin  aspart  0-9 Units Subcutaneous TID WC   isosorbide-hydrALAZINE   2 tablet Oral TID   metoprolol  succinate  25 mg Oral Daily   pantoprazole   40 mg Oral Daily   sodium chloride  flush  3 mL Intravenous Q12H    Diet Order             Diet renal with fluid restriction Fluid restriction: 1500 mL Fluid; Room service appropriate? Yes; Fluid consistency: Thin  Diet effective now                  Meal Intake: 50% per patient  Labs:     Latest Ref Rng & Units 10/28/2024    2:38 AM 10/27/2024    3:48 AM 10/26/2024    4:12 AM  CMP  Glucose 70 - 99 mg/dL  104  119  114   BUN 8 - 23 mg/dL 58  54  43   Creatinine 0.44 - 1.00 mg/dL 3.89  4.66  5.56   Sodium 135 - 145 mmol/L 138  138  133   Potassium 3.5 - 5.1 mmol/L 4.0  3.7  3.6   Chloride 98 - 111 mmol/L 100  101  99   CO2 22 - 32 mmol/L 23  24  25    Calcium  8.9 - 10.3 mg/dL 7.7  8.1  7.7      I/O: -7 L since admit  NUTRITION - FOCUSED PHYSICAL EXAM:  Flowsheet Row Most Recent Value  Orbital Region Moderate depletion  Upper Arm Region Moderate depletion  Thoracic and Lumbar Region Mild depletion  Buccal Region Moderate depletion  Temple Region Severe depletion  Clavicle Bone Region Severe depletion  Clavicle and Acromion Bone Region Severe depletion   Scapular Bone Region Severe depletion  Dorsal Hand Moderate depletion  Patellar Region Severe depletion  Anterior Thigh Region Severe depletion  Posterior Calf Region Severe depletion  Edema (RD Assessment) Mild  Hair Reviewed  Eyes Reviewed  Mouth Reviewed  Skin Reviewed  Nails Reviewed    EDUCATION NEEDS:   Not appropriate for education at this time  Skin:  Skin Assessment: Reviewed RN Assessment  Last BM:  11/9 type 4  Height:   Ht Readings from Last 1 Encounters:  10/24/24 5' 3 (1.6 m)    Weight:   51.3 Kg 12/31/23 from Care Everywhere   Weight Change: 4 Kg (8%) loss in 1 month  Edema: non-pitting BLE  Ideal Body Weight:  52 kg  BMI:  Body mass index is 18.82 kg/m.  Estimated Nutritional Needs:  Kcal:  1500-1800 Protein:  90-110 Fluid:  1.5 L fluid restriction noted    Erica Ruth, MS, RDN, LDN . [facility] See AMION for contact information

## 2024-10-28 NOTE — Progress Notes (Signed)
 Pt has been accepted at [facility] for a TTS 0545 am chair time slot. Tentative start date Thursday 11/13 pending d/c.   On Thursday 11/13 for first appointment she will need to arrive 0730 for paperwork and she will run after from 8am-12 for first tx.  Navigator has contacted daughter Darrick at this time, she is agreeable to this schedule including first appointment. Daughter stated she would like to be notified if these plans change as she will need to take off work. Met bedside with pt in HD at this time, pt appeared very drowsy. Provided schedule letter that includes printed appointment information, this was left in pt bed in HD, advised pt to give this to her daughter, pt agreeable.   Navigator has now informed MD, nephrologist, RN, CM/SW. Will update AVS and continue to assist as needed.   Lavanda Shraga Custard Dialysis Navigator [phone]

## 2024-10-28 NOTE — Plan of Care (Signed)
  Problem: Education: Goal: Ability to demonstrate management of disease process will improve Outcome: Progressing Goal: Ability to verbalize understanding of medication therapies will improve Outcome: Progressing   Problem: Activity: Goal: Capacity to carry out activities will improve Outcome: Progressing   Problem: Cardiac: Goal: Ability to achieve and maintain adequate cardiopulmonary perfusion will improve Outcome: Progressing   Problem: Education: Goal: Knowledge of General Education information will improve Description: Including pain rating scale, medication(s)/side effects and non-pharmacologic comfort measures Outcome: Progressing   Problem: Health Behavior/Discharge Planning: Goal: Ability to manage health-related needs will improve Outcome: Progressing   Problem: Clinical Measurements: Goal: Ability to maintain clinical measurements within normal limits will improve Outcome: Progressing Goal: Will remain free from infection Outcome: Progressing Goal: Diagnostic test results will improve Outcome: Progressing Goal: Respiratory complications will improve Outcome: Progressing Goal: Cardiovascular complication will be avoided Outcome: Progressing   Problem: Activity: Goal: Risk for activity intolerance will decrease Outcome: Progressing   Problem: Nutrition: Goal: Adequate nutrition will be maintained Outcome: Progressing   Problem: Coping: Goal: Level of anxiety will decrease Outcome: Progressing   Problem: Elimination: Goal: Will not experience complications related to bowel motility Outcome: Progressing Goal: Will not experience complications related to urinary retention Outcome: Progressing   Problem: Pain Managment: Goal: General experience of comfort will improve and/or be controlled Outcome: Progressing   Problem: Safety: Goal: Ability to remain free from injury will improve Outcome: Progressing   Problem: Skin Integrity: Goal: Risk for  impaired skin integrity will decrease Outcome: Progressing   Problem: Education: Goal: Knowledge of disease and its progression will improve Outcome: Progressing Goal: Individualized Educational Video(s) Outcome: Progressing   Problem: Fluid Volume: Goal: Compliance with measures to maintain balanced fluid volume will improve Outcome: Progressing   Problem: Health Behavior/Discharge Planning: Goal: Ability to manage health-related needs will improve Outcome: Progressing   Problem: Nutritional: Goal: Ability to make healthy dietary choices will improve Outcome: Progressing   Problem: Clinical Measurements: Goal: Complications related to the disease process, condition or treatment will be avoided or minimized Outcome: Progressing

## 2024-10-29 DIAGNOSIS — N179 Acute kidney failure, unspecified: Secondary | ICD-10-CM | POA: Diagnosis not present

## 2024-10-29 DIAGNOSIS — E43 Unspecified severe protein-calorie malnutrition: Secondary | ICD-10-CM | POA: Insufficient documentation

## 2024-10-29 DIAGNOSIS — Z7189 Other specified counseling: Secondary | ICD-10-CM | POA: Diagnosis not present

## 2024-10-29 DIAGNOSIS — J9601 Acute respiratory failure with hypoxia: Secondary | ICD-10-CM | POA: Diagnosis not present

## 2024-10-29 DIAGNOSIS — J810 Acute pulmonary edema: Secondary | ICD-10-CM | POA: Diagnosis not present

## 2024-10-29 LAB — CBC
HCT: 22.9 % — ABNORMAL LOW (ref 36.0–46.0)
Hemoglobin: 7.1 g/dL — ABNORMAL LOW (ref 12.0–15.0)
MCH: 30.9 pg (ref 26.0–34.0)
MCHC: 31 g/dL (ref 30.0–36.0)
MCV: 99.6 fL (ref 80.0–100.0)
Platelets: 188 K/uL (ref 150–400)
RBC: 2.3 MIL/uL — ABNORMAL LOW (ref 3.87–5.11)
RDW: 15.9 % — ABNORMAL HIGH (ref 11.5–15.5)
WBC: 7 K/uL (ref 4.0–10.5)
nRBC: 0 % (ref 0.0–0.2)

## 2024-10-29 LAB — RENAL FUNCTION PANEL
Albumin: 2.2 g/dL — ABNORMAL LOW (ref 3.5–5.0)
Anion gap: 10 (ref 5–15)
BUN: 28 mg/dL — ABNORMAL HIGH (ref 8–23)
CO2: 27 mmol/L (ref 22–32)
Calcium: 7.9 mg/dL — ABNORMAL LOW (ref 8.9–10.3)
Chloride: 100 mmol/L (ref 98–111)
Creatinine, Ser: 4.08 mg/dL — ABNORMAL HIGH (ref 0.44–1.00)
GFR, Estimated: 10 mL/min — ABNORMAL LOW (ref >60)
Glucose, Bld: 90 mg/dL (ref 70–99)
Phosphorus: 3.8 mg/dL (ref 2.5–4.6)
Potassium: 3.7 mmol/L (ref 3.5–5.1)
Sodium: 137 mmol/L (ref 135–145)

## 2024-10-29 LAB — GLUCOSE, CAPILLARY
Glucose-Capillary: 108 mg/dL — ABNORMAL HIGH (ref 70–99)
Glucose-Capillary: 158 mg/dL — ABNORMAL HIGH (ref 70–99)
Glucose-Capillary: 176 mg/dL — ABNORMAL HIGH (ref 70–99)

## 2024-10-29 MED ORDER — CHLORHEXIDINE GLUCONATE CLOTH 2 % EX PADS
6.0000 | MEDICATED_PAD | Freq: Every day | CUTANEOUS | Status: DC
  Administered 2024-10-30 – 2024-11-01 (×3): 6 via TOPICAL

## 2024-10-29 MED ORDER — ALTEPLASE 2 MG IJ SOLR
2.0000 mg | Freq: Once | INTRAMUSCULAR | Status: AC
  Administered 2024-10-29: 2 mg
  Filled 2024-10-29: qty 2

## 2024-10-29 NOTE — Progress Notes (Signed)
 This chaplain is present for spiritual care in the setting of the PMT NP-Joseph referral for F/U on the Pt. HCPOA.  The Pt. smiles and greets the chaplain as she enters the room. The Pt. is sitting in the bedside recliner finishing her breakfast. The Pt. RN is in/out of the room.  The chaplain re-introduced the topic of HCPOA with the Pt. The chaplain listens reflectively as the Pt. talks about the positive relationship and discussions with [name]. The Pt. is unable to repeat back or tell the chaplain what type of decisions a HCPOA is able to make for the Pt. The chaplain understands Darrick may visit today. This chaplain will attempt a revisit.  Chaplain Leeroy Hummer [phone]

## 2024-10-29 NOTE — Progress Notes (Signed)
 Occupational Therapy Treatment Patient Details Name: Erica Whitney MRN: [id number] DOB: 1941-09-07 Today's Date: 10/29/2024   History of present illness Pt is an 83 y.o. female who presented 10/21/24 with SOB, AMS, and R-sided pain. Pt admitted with acute hypoxic respiratory failure due to pulmonary edema, CKD V now ESRD, volume overload, uremic encephalopathy, and concern for pneumonia. Pt with bleeding from HD cath 11/11 and possibly will need new port. PMH: anemia in CKD, arthritis, breast cancer, CKD stage IV, depression, DM, hypercholesteremia, HTN, insomnia, peptic ulcer, uterine cancer   OT comments  Pt making good progress toward all goals. Pt is at a CG assist level for most adls. CG assist needed for safety, cognition and the tendency to run into items on her left when walking in room. Further evaluation needed of vision.  Appears daughter has some assist at home. Recommend home with [facility] when pt  medically stable. Will continue to see with focus on getting pt to supervision level with all adls.      If plan is discharge home, recommend the following:  A little help with walking and/or transfers;A little help with bathing/dressing/bathroom;Assistance with cooking/housework;Direct supervision/assist for medications management;Direct supervision/assist for financial management;Assist for transportation;Supervision due to cognitive status   Equipment Recommendations  None recommended by OT    Recommendations for Other Services      Precautions / Restrictions Precautions Precautions: Fall Recall of Precautions/Restrictions: Impaired Restrictions Weight Bearing Restrictions Per Provider Order: No       Mobility Bed Mobility Overal bed mobility: Needs Assistance Bed Mobility: Supine to Sit     Supine to sit: Supervision     General bed mobility comments: for safety    Transfers Overall transfer level: Needs assistance Equipment used: Rolling walker (2 wheels),  None Transfers: Sit to/from Stand Sit to Stand: Contact guard assist           General transfer comment: CG assist for safety. Pt never needed physical assist. Cues given to scoot to EOB before getting up.     Balance Overall balance assessment: Needs assistance Sitting-balance support: Feet supported Sitting balance-Leahy Scale: Good     Standing balance support: Bilateral upper extremity supported, During functional activity, No upper extremity supported Standing balance-Leahy Scale: Fair Standing balance comment: Able to stand statically without UE support with trunk sway and CGA. Reliant on RW to ambulate                           ADL either performed or assessed with clinical judgement   ADL Overall ADL's : Needs assistance/impaired Eating/Feeding: Independent;Sitting   Grooming: Wash/dry hands;Wash/dry face;Oral care;Supervision/safety;Standing Grooming Details (indicate cue type and reason): pt stood at sink for 4 minutes to groom with no LOB. Did require cues to remember to take walker all the way to the sink Upper Body Bathing: Supervision/ safety;Sitting   Lower Body Bathing: Contact guard assist;Sit to/from stand;Cueing for compensatory techniques   Upper Body Dressing : Set up;Sitting   Lower Body Dressing: Contact guard assist;Sit to/from stand;Cueing for compensatory techniques   Toilet Transfer: Contact guard assist;Ambulation;Comfort height toilet;Rolling walker (2 wheels) Toilet Transfer Details (indicate cue type and reason): pt walked to bathroom Toileting- Clothing Manipulation and Hygiene: Contact guard assist;Sit to/from stand;Cueing for compensatory techniques Toileting - Clothing Manipulation Details (indicate cue type and reason): low toilet requiring CG assist.     Functional mobility during ADLs: Contact guard assist;Rolling walker (2 wheels) General ADL Comments: Pt doing  better with adls requiring min guard when on her feet.  Pt does  tend to run into items on her left.  Need to further assess vision to her left.    Extremity/Trunk Assessment Upper Extremity Assessment Upper Extremity Assessment: Overall WFL for tasks assessed            Vision   Vision Assessment?: Yes Eye Alignment: Within Functional Limits Ocular Range of Motion: Within Functional Limits Alignment/Gaze Preference: Within Defined Limits Tracking/Visual Pursuits: Able to track stimulus in all quads without difficulty Additional Comments: Pt tends to run into things when walking that are on the Left side   Perception     Praxis     Communication Communication Communication: Impaired Factors Affecting Communication: Hearing impaired   Cognition Arousal: Alert Behavior During Therapy: WFL for tasks assessed/performed Cognition: History of cognitive impairments             OT - Cognition Comments: Memory deficits at baseline. Has difficulty following multistep commands.                 Following commands: Impaired Following commands impaired: Follows one step commands with increased time, Follows multi-step commands inconsistently, Follows multi-step commands with increased time      Cueing   Cueing Techniques: Verbal cues, Tactile cues, Visual cues  Exercises      Shoulder Instructions       General Comments Pt has made progress since initial eval in all areas. CG assist still given due to safety concerns and baseline cognitive deficits as well as the tendency to run into things on the left.    Pertinent Vitals/ Pain       Pain Assessment Pain Assessment: No/denies pain  Home Living                                          Prior Functioning/Environment              Frequency  Min 2X/week        Progress Toward Goals  OT Goals(current goals can now be found in the care plan section)  Progress towards OT goals: Progressing toward goals  Acute Rehab OT Goals Patient Stated Goal: to  bathe and dress myself OT Goal Formulation: With patient Time For Goal Achievement: 11/07/24 Potential to Achieve Goals: Fair ADL Goals Pt Will Perform Grooming: with set-up;standing Pt Will Perform Upper Body Dressing: with set-up;sitting Pt Will Perform Lower Body Dressing: sit to/from stand;with contact guard assist Pt Will Transfer to Toilet: with supervision;ambulating;regular height toilet  Plan      Co-evaluation                 AM-PAC OT 6 Clicks Daily Activity     Outcome Measure   Help from another person eating meals?: None Help from another person taking care of personal grooming?: A Little Help from another person toileting, which includes using toliet, bedpan, or urinal?: A Little Help from another person bathing (including washing, rinsing, drying)?: A Little Help from another person to put on and taking off regular upper body clothing?: A Little Help from another person to put on and taking off regular lower body clothing?: A Little 6 Click Score: 19    End of Session Equipment Utilized During Treatment: Rolling walker (2 wheels);Gait belt  OT Visit Diagnosis: Unsteadiness on feet (R26.81);Muscle weakness (generalized) (M62.81);Other symptoms and  signs involving cognitive function   Activity Tolerance Patient tolerated treatment well   Patient Left in chair;with call bell/phone within reach;with chair alarm set   Nurse Communication Mobility status        Time: 9248-9185 OT Time Calculation (min): 23 min  Charges: OT General Charges $OT Visit: 1 Visit OT Treatments $Self Care/Home Management : 23-37 mins   Joshua Silvano Dragon 10/29/2024, 8:25 AM

## 2024-10-29 NOTE — Progress Notes (Signed)
 Following for out-pt Hd set up. Has a spot at [facility], but will unlikely be ready for d/c and HD at clinic tomorrow. Have called and notified clinic that she will not be arriving tomorrow and start date will need to pushed out, will update clinic further once anticipated d/c date known. Will continue to assist as needed.   Lavanda Raymon Schlarb Dialysis Navigator [phone]

## 2024-10-29 NOTE — Progress Notes (Signed)
 Daily Progress Note   Patient Name: Erica Whitney       Date: 10/29/2024 DOB: 1941-11-07  Age: 83 y.o. MRN#: [id number] Attending Physician: Raenelle Donalda HERO, MD Primary Care Physician: [facility] Admit Date: 10/21/2024 Length of Stay: 8 days  Reason for Consultation/Follow-up: Establishing goals of care  Subjective:  Subjective: EMR reviewed including personal review of recent notes from hospitalist, palliative care, nephrology, and vascular surgery.  Plan noted for tunneled dialysis catheter and left upper extremity AV fistula versus graft on Thursday of this week.  Labs today reviewed and sodium 137, potassium 3.7, creatinine 4.08, albumin 2.2, hemoglobin 7.1.  No additional imaging performed today.  I saw and examined patient this morning.  On arrival she was sitting in the bedside chair with no distress noted.  She is awake but remains hard of hearing.  She is pleasant and cooperative to exam, but does not always answer questions thoroughly when asked about her understanding of the overall situation.  She is able to state that she wants to continue with dialysis and is also able to tell me that they are planning on doing a procedure tomorrow to allow her to continue with dialysis long-term.  I gave her a High Level overview of dialysis catheter versus fistula and she reports that they have talked with her about this and she understands procedure and would like to proceed.  Her only complaint today is being tired.  She tells me she is getting used to the idea of being on dialysis and that it is not too bad and she is open to continuing forward at this point.  We again discussed that the goal of dialysis over the long-term is that time and quality to her life and if she reaches a point where she feels that this is not what she is getting from dialysis, decision to continue dialysis can always be revisited at that point in time.  Review of Systems tired today, reports  abdominal pain and having headache after dialysis sessions.  Objective:   Vital Signs:  BP (!) 115/54 (BP Location: Right Arm)   Pulse 71   Temp 98.1 F (36.7 C) (Oral)   Resp 16   Ht 5' 3 (1.6 m)   Wt 48.2 kg   SpO2 100%   BMI 18.82 kg/m   Physical Exam: General: Frail-appearing.  No acute distress ENT: Atraumatic normocephalic CV: Regular rate and rhythm Lungs: No increased work of breathing Skin: Warm and dry  Imaging: @IMAGES @  I personally reviewed recent imaging.   Assessment & Plan:   Assessment:82 y.o. female with past medical history of HTN, HLD, CKD 5 not on dialysis, history of breast cancer in remission, uterine cancer s/p hysterectomy (1995), T2DM admitted on 10/21/2024 with hypertensive emergency pulmonary edema, AKI on CKD5, and metabolic encephalopathy.  She had previously stated as an outpatient and she would not want dialysis long-term, however, trial of dialysis was started and now she has stated plan to continue at this time (although her understanding of her situation is still not clear) recommendations/Plan: # Complex medical decision making/goals of care:  - She has a superficial understanding of her medical condition and care plan but she is consistent in continuing to state that she wants to proceed with fistula placement, dialysis catheter placement, and continued dialysis moving forward.  She does tell me that she finds dialysis to be  better than she had anticipated.   Revisited concept that dialysis is to add quality to  her life and if she feels that dialysis is no longer adding to her quality, decision to continue dialysis can always be revisited in the future.  -  Code Status: Limited: Do not attempt resuscitation (DNR) -DNR-LIMITED -Do Not Intubate/DNI   Prognosis: Guarded  # Psychosocial Support:  - Family including her children  # Discharge Planning: To Be Determined  -  Discussed with: Patient  Thank you for allowing the palliative  care team to participate in the care Erica Whitney.  Amaryllis Meissner, MD Palliative Care Provider PMT # [phone]  If patient remains symptomatic despite maximum doses, please call PMT at [phone] between 0700 and 1900. Outside of these hours, please call attending, as PMT does not have night coverage.   I personally spent a total of 42 minutes in the care of the patient today including preparing to see the patient, getting/reviewing separately obtained history, performing a medically appropriate exam/evaluation, counseling and educating, documenting clinical information in the EHR, and communicating results.

## 2024-10-29 NOTE — Progress Notes (Signed)
 PT Cancellation Note  Patient Details Name: Erica Whitney MRN: [id number] DOB: 1941-09-20   Cancelled Treatment:    Reason Eval/Treat Not Completed: Other (comment). MD requesting PT hold off at this time and re-attempt tomorrow due to need for permanent HD cath placement, which is anticipated to be placed tomorrow.   [name], PT, DPT Acute Rehabilitation Services  Office: [phone]    [name] 10/29/2024, 9:04 AM

## 2024-10-29 NOTE — Progress Notes (Signed)
  Progress Note    10/29/2024 8:36 AM * No surgery found *  Subjective: No complaints    Vitals:   10/29/24 0004 10/29/24 0427  BP: 112/62 (!) 142/68  Pulse:    Resp:    Temp: 98.4 F (36.9 C) 97.9 F (36.6 C)  SpO2:      Physical Exam: General: Sitting up in bed, NAD Cardiac: Regular Lungs: Nonlabored Extremities: Palpable left brachial pulse   CBC    Component Value Date/Time   WBC 7.0 10/29/2024 0301   RBC 2.30 (L) 10/29/2024 0301   HGB 7.1 (L) 10/29/2024 0301   HGB 9.6 (L) 02/15/2023 0946   HGB 10.8 (L) 11/15/2017 0759   HCT 22.9 (L) 10/29/2024 0301   HCT 33.1 (L) 11/15/2017 0759   PLT 188 10/29/2024 0301   PLT 253 02/15/2023 0946   PLT 196 11/15/2017 0759   MCV 99.6 10/29/2024 0301   MCV 90.8 11/15/2017 0759   MCH 30.9 10/29/2024 0301   MCHC 31.0 10/29/2024 0301   RDW 15.9 (H) 10/29/2024 0301   RDW 14.2 11/15/2017 0759   LYMPHSABS 0.4 (L) 10/21/2024 0817   LYMPHSABS 2.0 11/15/2017 0759   MONOABS 0.7 10/21/2024 0817   MONOABS 0.9 11/15/2017 0759   EOSABS 0.0 10/21/2024 0817   EOSABS 0.2 11/15/2017 0759   BASOSABS 0.0 10/21/2024 0817   BASOSABS 0.1 11/15/2017 0759    BMET    Component Value Date/Time   NA 137 10/29/2024 0301   NA 141 11/15/2017 0759   K 3.7 10/29/2024 0301   K 4.4 11/15/2017 0759   CL 100 10/29/2024 0301   CO2 27 10/29/2024 0301   CO2 26 11/15/2017 0759   GLUCOSE 90 10/29/2024 0301   GLUCOSE 176 (H) 11/15/2017 0759   BUN 28 (H) 10/29/2024 0301   BUN 29.8 (H) 11/15/2017 0759   CREATININE 4.08 (H) 10/29/2024 0301   CREATININE 4.38 (H) 02/27/2023 0918   CREATININE 1.1 11/15/2017 0759   CALCIUM  7.9 (L) 10/29/2024 0301   CALCIUM  10.6 (H) 11/15/2017 0759   GFRNONAA 10 (L) 10/29/2024 0301   GFRNONAA 11 (L) 02/15/2023 0946   GFRNONAA 17 (L) 01/27/2021 0950   GFRAA 20 (L) 01/27/2021 0950    INR    Component Value Date/Time   INR 1.2 01/26/2023 0250     Intake/Output Summary (Last 24 hours) at 10/29/2024 0836 Last  data filed at 10/28/2024 1149 Gross per 24 hour  Intake --  Output 2000 ml  Net -2000 ml      Assessment/Plan:  83 y.o. female with ESRD   - She is doing well this morning without any complaints -Currently planning on left upper extremity AV fistula versus graft tomorrow in the OR.  The patient remains agreeable.  All questions have been answered. -Will make n.p.o. at midnight and place consent orders   Ahmed Holster, PA-C [facility] [phone] 10/29/2024 8:36 AM

## 2024-10-29 NOTE — Progress Notes (Signed)
 During med administration, pt reported that her femoral HD cath was bleeding. Upon assessment, dressing intact but blood saturated underneath and oozing from sides. Pt was using facial tissues to wipe blood from site. Pressure held to site x 5 min and HD nurse contacted. Suggested that dressing be changed to assess for continued bleeding. IV team consult placed for dressing change.

## 2024-10-29 NOTE — Progress Notes (Signed)
 [facility] NEPHROLOGY PROGRESS NOTE  Assessment/ Plan: Pt is a 83 y.o. yo female with new ESRD on dialysis.  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  The first HD was on 11/5 and has been tolerating well.  On and off issue with femoral HD line mainly related with her movement.   -Vascular surgeon planning to take her to the OR tomorrow for AVF versus graft and TDC placement. -She had dialysis yesterday, plan for next HD tomorrow. -OP HD arranged at [facility] schedule.    # Hypertension/volume: Monitor BP, UF with HD.  # Anemia of CKD: Continue weekly Aranesp, monitor hemoglobin.  May need blood transfusion if hemoglobin drops below 7.  # CHF, optimize volume with dialysis, fluid restriction.  # CKD-MBD: Corrected calcium  level and phosphorus level acceptable.  Also on VDRA.  Subjective: Seen and examined.  Tolerated dialysis well yesterday with 2 L ultrafiltration.  No new event and plan to go to the OR tomorrow. Objective Vital signs in last 24 hours: Vitals:   10/29/24 0004 10/29/24 0427 10/29/24 0843 10/29/24 1115  BP: 112/62 (!) 142/68 124/68 (!) 115/54  Pulse:   77 71  Resp:   16 16  Temp: 98.4 F (36.9 C) 97.9 F (36.6 C) 98.2 F (36.8 C) 98.1 F (36.7 C)  TempSrc: Oral Axillary Oral Oral  SpO2:   100% 100%  Weight:      Height:       Weight change: 1.7 kg  Intake/Output Summary (Last 24 hours) at 10/29/2024 1123 Last data filed at 10/28/2024 1149 Gross per 24 hour  Intake --  Output 2000 ml  Net -2000 ml       Labs: RENAL PANEL Recent Labs  Lab 10/23/24 0324 10/24/24 0342 10/25/24 0421 10/26/24 0412 10/27/24 0348 10/28/24 0238 10/29/24 0301  NA 142   < > 135 133* 138 138 137  K 3.7   < > 3.2* 3.6 3.7 4.0 3.7  CL 108   < > 100 99 101 100 100  CO2 17*   < > 20* 25 24 23 27   GLUCOSE 83   < > 116* 114* 119* 104* 90  BUN 108*   < > 81* 43* 54* 58* 28*  CREATININE 8.04*   <  > 6.56* 4.43* 5.33* 6.10* 4.08*  CALCIUM  7.8*   < > 7.6* 7.7* 8.1* 7.7* 7.9*  MG 1.7  --   --   --   --   --   --   PHOS 8.0*   < > 5.3* 3.7 4.1 4.6 3.8  ALBUMIN 2.5*   < > 2.2* 2.2* 2.3* 2.4* 2.2*   < > = values in this interval not displayed.    Liver Function Tests: Recent Labs  Lab 10/27/24 0348 10/28/24 0238 10/29/24 0301  ALBUMIN 2.3* 2.4* 2.2*   No results for input(s): LIPASE, AMYLASE in the last 168 hours. No results for input(s): AMMONIA in the last 168 hours. CBC: Recent Labs    07/02/24 0819 07/02/24 0822 08/13/24 0830 08/27/24 9177 09/10/24 0809 09/10/24 9188 10/08/24 0854 10/08/24 0906 10/22/24 1336 10/23/24 0324 10/24/24 0342 10/25/24 0421 10/27/24 0348 10/28/24 0815 10/29/24 0301  HGB  --    < >  --    < >  --    < >  --    < >  --    < > 7.2* 7.1* 7.6* 7.3* 7.1*  MCV  --   --   --   --   --   --   --    < >  --    < > 94.4 95.6 97.6 100.8* 99.6  FERRITIN 226  --  192  --  192  --  281  --  273  --   --   --   --   --   --   TIBC 188*  --  189*  --  209*  --  185*  --  140*  --   --   --   --   --   --   IRON  40  --  64  --  81  --  90  --  18*  --   --   --   --   --   --    < > = values in this interval not displayed.    Cardiac Enzymes: No results for input(s): CKTOTAL, CKMB, CKMBINDEX, TROPONINI in the last 168 hours. CBG: Recent Labs  Lab 10/28/24 1312 10/28/24 1555 10/28/24 2116 10/29/24 0846 10/29/24 0917  GLUCAP 114* 108* 148* 108* 158*    Iron  Studies: No results for input(s): IRON , TIBC, TRANSFERRIN, FERRITIN in the last 72 hours. Studies/Results: VAS US  UPPER EXT VEIN MAPPING (PRE-OP AVF) Result Date: 10/27/2024 UPPER EXTREMITY VEIN MAPPING Patient Name:  Erica Whitney  Date of Exam:   10/27/2024 Medical Rec #: [id number]           Accession #:    [id number] Date of Birth: July 14, 1941          Patient Gender: F Patient Age:   58 years Exam Location:  [facility] Procedure:      VAS US  UPPER EXT  VEIN MAPPING (PRE-OP AVF) Referring Phys: KATHERYN SABA --------------------------------------------------------------------------------  Indications: Pre-access. History: ESRD need for HD access.  Limitations: patient movement and body habitus Comparison Study: No previous exams Performing Technologist: Jody Hill RVT, RDMS  Examination Guidelines: A complete evaluation includes B-mode imaging, spectral Doppler, color Doppler, and power Doppler as needed of all accessible portions of each vessel. Bilateral testing is considered an integral part of a complete examination. Limited examinations for reoccurring indications may be performed as noted. +-----------------+-------------+----------+---------+ Right Cephalic   Diameter (cm)Depth (cm)Findings  +-----------------+-------------+----------+---------+ Shoulder             0.32                         +-----------------+-------------+----------+---------+ Prox upper arm       0.22                         +-----------------+-------------+----------+---------+ Mid upper arm        0.24               branching +-----------------+-------------+----------+---------+ Dist upper arm       0.17                         +-----------------+-------------+----------+---------+ Antecubital fossa    0.20                         +-----------------+-------------+----------+---------+ Prox forearm         0.31               branching +-----------------+-------------+----------+---------+ Mid forearm  0.17                         +-----------------+-------------+----------+---------+ Dist forearm         0.10               branching +-----------------+-------------+----------+---------+ Wrist                0.10                         +-----------------+-------------+----------+---------+ +-----------------+-------------+----------+--------------+ Right Basilic    Diameter (cm)Depth (cm)   Findings     +-----------------+-------------+----------+--------------+ Prox upper arm       0.38        0.76                  +-----------------+-------------+----------+--------------+ Mid upper arm        0.24        0.78                  +-----------------+-------------+----------+--------------+ Dist upper arm       0.25        0.76                  +-----------------+-------------+----------+--------------+ Antecubital fossa    0.22        0.33     branching    +-----------------+-------------+----------+--------------+ Prox forearm         0.11        0.16                  +-----------------+-------------+----------+--------------+ Mid forearm          0.10        0.17     branching    +-----------------+-------------+----------+--------------+ Distal forearm       0.07        0.19                  +-----------------+-------------+----------+--------------+ Wrist                                   not visualized +-----------------+-------------+----------+--------------+ +-----------------+-------------+----------+---------+ Left Cephalic    Diameter (cm)Depth (cm)Findings  +-----------------+-------------+----------+---------+ Shoulder             0.28                         +-----------------+-------------+----------+---------+ Prox upper arm       0.27                         +-----------------+-------------+----------+---------+ Mid upper arm        0.25                         +-----------------+-------------+----------+---------+ Dist upper arm       0.27               branching +-----------------+-------------+----------+---------+ Antecubital fossa    0.37                         +-----------------+-------------+----------+---------+ Prox forearm         0.35               branching +-----------------+-------------+----------+---------+ Mid forearm          0.22  branching  +-----------------+-------------+----------+---------+ Dist forearm         0.17                         +-----------------+-------------+----------+---------+ Wrist                0.13                         +-----------------+-------------+----------+---------+ +-----------------+-------------+----------+---------+ Left Basilic     Diameter (cm)Depth (cm)Findings  +-----------------+-------------+----------+---------+ Prox upper arm     0.38/0.22  0.75/0.56 branching +-----------------+-------------+----------+---------+ Mid upper arm        0.31        0.69             +-----------------+-------------+----------+---------+ Dist upper arm       0.24        0.54             +-----------------+-------------+----------+---------+ Antecubital fossa    0.28        0.44   branching +-----------------+-------------+----------+---------+ Prox forearm         0.20        0.28             +-----------------+-------------+----------+---------+ Mid forearm          0.20        0.43             +-----------------+-------------+----------+---------+ Distal forearm       0.20        0.21             +-----------------+-------------+----------+---------+ Wrist                0.16        0.21             +-----------------+-------------+----------+---------+ *See table(s) above for measurements and observations.  Diagnosing physician: Fonda Rim Electronically signed by Fonda Rim on 10/27/2024 at 3:13:57 PM.    Final     Medications: Infusions:    Scheduled Medications:  amLODipine   10 mg Oral Daily   aspirin EC  81 mg Oral Daily   atorvastatin   80 mg Oral Daily   calcitRIOL  0.25 mcg Oral Daily   Chlorhexidine  Gluconate Cloth  6 each Topical Q0600   Chlorhexidine  Gluconate Cloth  6 each Topical Q0600   Chlorhexidine  Gluconate Cloth  6 each Topical Q0600   cycloSPORINE  1 drop Both Eyes Daily   [START ON 10/31/2024] darbepoetin (ARANESP) injection -  DIALYSIS  100 mcg Subcutaneous Q Fri-1800   feeding supplement (NEPRO CARB STEADY)  237 mL Oral BID BM   ferrous sulfate   325 mg Oral Q breakfast   insulin  aspart  0-5 Units Subcutaneous QHS   insulin  aspart  0-9 Units Subcutaneous TID WC   isosorbide-hydrALAZINE   2 tablet Oral TID   metoprolol  succinate  25 mg Oral Daily   pantoprazole   40 mg Oral Daily   sodium chloride  flush  3 mL Intravenous Q12H    have reviewed scheduled and prn medications.  Physical Exam: General:NAD, frail elderly female Heart:RRR, s1s2 nl Lungs:clear b/l, no crackle Abdomen:soft, Non-tender, non-distended Extremities:No edema Dialysis Access: Femoral groin catheter , no sign of active bleeding.  Erica Whitney Prasad Daisuke Bailey 10/29/2024,11:23 AM  LOS: 8 days

## 2024-10-29 NOTE — Progress Notes (Signed)
 PROGRESS NOTE        PATIENT DETAILS Name: Erica Whitney Age: 83 y.o. Sex: female Date of Birth: 1941-03-30 Admit Date: 10/21/2024 Admitting Physician Harden Jude GAILS, MD PCP:[facility]  Brief Summary: Patient is a 83 y.o.  female with history of CKD 5-who was brought to the ED with shortness of breath/confusion/uncontrolled hypertension-she was found to have acute pulmonary edema in the setting of progression to ESRD.  She was initially admitted to the hospitalist service but became agitated-ripped off BiPAP-had uncontrolled hypertension-requiring initiation of nitroglycerin drip-and transferred to the ICU.  Per nephrology team-patient had not wanted dialysis as an outpatient-however patient was not able to make her own decisions due to encephalopathy-family elected to start hemodialysis.  She subsequently improved-and was transferred to [facility] on 11/7.  Significant events: 11/4>> admit to [facility]-but transferred to ICU due to agitation-nitroglycerin drip 11/5>> started on HD 11/7>> transferred to [facility].  Significant studies: 11/4>> chest x-ray: Findings compatible with interstitial edema 11/4>> echo: EF 45-50%,+ wall motion abnormality, moderately elevated pulmonary artery systolic pressure.  Severe MR, moderate TR  Significant microbiology data: 11/4>> COVID/influenza/RSV PCR: Negative  Procedures: 11/4>> temporary right groin dialysis catheter by PCCM.  Consults: Nephrology cardiology PCCM Palliative care Vascular surgery  Subjective: Lying complaint bed-no major issues overnight.  No chest pain or shortness of breath.  Objective: Vitals: Blood pressure 124/68, pulse 77, temperature 98.2 F (36.8 C), temperature source Oral, resp. rate 16, height 5' 3 (1.6 m), weight 48.2 kg, SpO2 100%.   Exam: Gen Exam:Alert awake-not in any distress-frail/chronically sick appearing. HEENT:atraumatic, normocephalic Chest: B/L clear to auscultation  anteriorly CVS:S1S2 regular Abdomen:soft non tender, non distended Extremities:no edema Neurology: Non focal-has generalized weakness Skin: no rash  Pertinent Labs/Radiology:    Latest Ref Rng & Units 10/29/2024    3:01 AM 10/28/2024    8:15 AM 10/27/2024    3:48 AM  CBC  WBC 4.0 - 10.5 K/uL 7.0  7.5  7.7   Hemoglobin 12.0 - 15.0 g/dL 7.1  7.3  7.6   Hematocrit 36.0 - 46.0 % 22.9  23.8  24.0   Platelets 150 - 400 K/uL 188  172  132     Lab Results  Component Value Date   NA 137 10/29/2024   K 3.7 10/29/2024   CL 100 10/29/2024   CO2 27 10/29/2024      Assessment/Plan: Acute hypoxic respiratory failure secondary to acute pulmonary edema in the setting of CKD 5 with 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  Nephrology/palliative care engaging family/patient-after extra discussion-plans are now to pursue [facility] catheter/AV fistula-scheduled for tomorrow by vascular surgery.  Clipping process for outpatient HD underway.    Acute metabolic encephalopathy Secondary to uremia Although encephalopathy markedly better-slow to respond but does respond appropriately Delirium precautions.  Hypertensive crisis Required ICU transfer nitroglycerin infusion Blood pressure now much better after HD Continue amlodipine , metoprolol , BiDil  Elevated troponin Doubt type I non-STEMI Suspect related to demand ischemia/type II non-STEMI. Echo as above with mildly suppressed EF and some regional wall motion abnormality Cardiology ongoing-medically managed at this point.  Normocytic anemia Secondary to critical illness and anemia related to ESRD Defer Aranesp/iron  therapies to nephrology service  Follow CBC-transfuse if any further drop in  hemoglobin  Thrombocytopenia Mild Suspect related to uremia Hopefully will improve with supportive care-continue to follow trend  Hypokalemia Repleted-recheck periodically  DM-2  CBG stable SSI  Recent Labs    10/28/24 2116 10/29/24 0846 10/29/24 0917  GLUCAP 148* 108* 158*     History of depression Apparently no longer taking Zoloft  or Remeron  per home MAR. Supportive care  Insomnia Trazodone    Nutrition Status: Nutrition Problem: Severe Malnutrition Etiology: chronic illness Signs/Symptoms: percent weight loss, severe muscle depletion, moderate fat depletion Percent weight loss: 8 % (in 1 month) Interventions: Nepro shake ;  Code status:   Code Status: Limited: Do not attempt resuscitation (DNR) -DNR-LIMITED -Do Not Intubate/DNI    DVT Prophylaxis: Place and maintain sequential compression device Start: 10/22/24 0949 SCDs Start: 10/21/24 1948   Family Communication: Daughter at bedside 11/9-none at bedside this morning   Disposition Plan: Status is: Inpatient Remains inpatient appropriate because: Severity of illness   Planned Discharge Destination:Home health versus SNF   Diet: Diet Order             Diet NPO time specified  Diet effective midnight           Diet renal with fluid restriction Fluid restriction: 1500 mL Fluid; Room service appropriate? Yes; Fluid consistency: Thin  Diet effective now                     Antimicrobial agents: Anti-infectives (From admission, onward)    Start     Dose/Rate Route Frequency Ordered Stop   10/23/24 1100  doxycycline (VIBRA-TABS) tablet 100 mg        100 mg Oral Every 12 hours 10/23/24 1005 10/27/24 2136        MEDICATIONS: Scheduled Meds:  amLODipine   10 mg Oral Daily   aspirin EC  81 mg Oral Daily   atorvastatin   80 mg Oral Daily   calcitRIOL  0.25 mcg Oral Daily   Chlorhexidine  Gluconate Cloth  6 each Topical Q0600   Chlorhexidine  Gluconate Cloth  6 each Topical Q0600    Chlorhexidine  Gluconate Cloth  6 each Topical Q0600   cycloSPORINE  1 drop Both Eyes Daily   [START ON 10/31/2024] darbepoetin (ARANESP) injection - DIALYSIS  100 mcg Subcutaneous Q Fri-1800   feeding supplement (NEPRO CARB STEADY)  237 mL Oral BID BM   ferrous sulfate   325 mg Oral Q breakfast   insulin  aspart  0-5 Units Subcutaneous QHS   insulin  aspart  0-9 Units Subcutaneous TID WC   isosorbide-hydrALAZINE   2 tablet Oral TID   metoprolol  succinate  25 mg Oral Daily   pantoprazole   40 mg Oral Daily   sodium chloride  flush  3 mL Intravenous Q12H   Continuous Infusions:    PRN Meds:.acetaminophen  **OR** acetaminophen , albuterol , artificial tears, fentaNYL  (SUBLIMAZE ) injection, labetalol , senna-docusate, traZODone    I have personally reviewed following labs and imaging studies  LABORATORY DATA: CBC: Recent Labs  Lab 10/24/24 0342 10/25/24 0421 10/27/24 0348 10/28/24 0815 10/29/24 0301  WBC 5.8 5.5 7.7 7.5 7.0  HGB 7.2* 7.1* 7.6* 7.3* 7.1*  HCT 22.0* 21.8* 24.0* 23.8* 22.9*  MCV 94.4 95.6 97.6 100.8* 99.6  PLT 70* 88* 132* 172 188    Basic Metabolic Panel: Recent Labs  Lab 10/23/24 0324 10/24/24 0342 10/25/24 0421 10/26/24 0412 10/27/24 0348 10/28/24 0238 10/29/24 0301  NA 142   < > 135 133* 138 138 137  K 3.7   < >  3.2* 3.6 3.7 4.0 3.7  CL 108   < > 100 99 101 100 100  CO2 17*   < > 20* 25 24 23 27   GLUCOSE 83   < > 116* 114* 119* 104* 90  BUN 108*   < > 81* 43* 54* 58* 28*  CREATININE 8.04*   < > 6.56* 4.43* 5.33* 6.10* 4.08*  CALCIUM  7.8*   < > 7.6* 7.7* 8.1* 7.7* 7.9*  MG 1.7  --   --   --   --   --   --   PHOS 8.0*   < > 5.3* 3.7 4.1 4.6 3.8   < > = values in this interval not displayed.    GFR: Estimated Creatinine Clearance: 8.2 mL/min (A) (by C-G formula based on SCr of 4.08 mg/dL (H)).  Liver Function Tests: Recent Labs  Lab 10/25/24 0421 10/26/24 0412 10/27/24 0348 10/28/24 0238 10/29/24 0301  ALBUMIN 2.2* 2.2* 2.3* 2.4* 2.2*   No  results for input(s): LIPASE, AMYLASE in the last 168 hours. No results for input(s): AMMONIA in the last 168 hours.  Coagulation Profile: No results for input(s): INR, PROTIME in the last 168 hours.  Cardiac Enzymes: No results for input(s): CKTOTAL, CKMB, CKMBINDEX, TROPONINI in the last 168 hours.  BNP (last 3 results) No results for input(s): PROBNP in the last 8760 hours.  Lipid Profile: No results for input(s): CHOL, HDL, LDLCALC, TRIG, CHOLHDL, LDLDIRECT in the last 72 hours.  Thyroid  Function Tests: No results for input(s): TSH, T4TOTAL, FREET4, T3FREE, THYROIDAB in the last 72 hours.  Anemia Panel: No results for input(s): VITAMINB12, FOLATE, FERRITIN, TIBC, IRON , RETICCTPCT in the last 72 hours.   Urine analysis:    Component Value Date/Time   COLORURINE YELLOW 01/26/2023 0815   APPEARANCEUR CLEAR 01/26/2023 0815   LABSPEC 1.011 01/26/2023 0815   PHURINE 5.0 01/26/2023 0815   GLUCOSEU NEGATIVE 01/26/2023 0815   HGBUR NEGATIVE 01/26/2023 0815   BILIRUBINUR NEGATIVE 01/26/2023 0815   KETONESUR NEGATIVE 01/26/2023 0815   PROTEINUR 100 (A) 01/26/2023 0815   UROBILINOGEN 0.2 01/30/2012 1215   NITRITE NEGATIVE 01/26/2023 0815   LEUKOCYTESUR NEGATIVE 01/26/2023 0815    Sepsis Labs: Lactic Acid, Venous    Component Value Date/Time   LATICACIDVEN 0.7 01/26/2023 0556    MICROBIOLOGY: Recent Results (from the past 240 hours)  Resp panel by RT-PCR (RSV, Flu A&B, Covid) Anterior Nasal Swab     Status: None   Collection Time: 10/21/24  8:06 AM   Specimen: Anterior Nasal Swab  Result Value Ref Range Status   SARS Coronavirus 2 by RT PCR NEGATIVE NEGATIVE Final   Influenza A by PCR NEGATIVE NEGATIVE Final   Influenza B by PCR NEGATIVE NEGATIVE Final    Comment: (NOTE) The Xpert Xpress SARS-CoV-2/FLU/RSV plus assay is intended as an aid in the diagnosis of influenza from Nasopharyngeal swab specimens and should  not be used as a sole basis for treatment. Nasal washings and aspirates are unacceptable for Xpert Xpress SARS-CoV-2/FLU/RSV testing.  Fact Sheet for Patients: bloggercourse.com  Fact Sheet for Healthcare Providers: seriousbroker.it  This test is not yet approved or cleared by the [place]  FDA and has been authorized for detection and/or diagnosis of SARS-CoV-2 by FDA under an Emergency Use Authorization (EUA). This EUA will remain in effect (meaning this test can be used) for the duration of the COVID-19 declaration under Section 564(b)(1) of the Act, 21 U.S.C. section 360bbb-3(b)(1), unless the authorization is terminated or revoked.  Resp Syncytial Virus by PCR NEGATIVE NEGATIVE Final    Comment: (NOTE) Fact Sheet for Patients: bloggercourse.com  Fact Sheet for Healthcare Providers: seriousbroker.it  This test is not yet approved or cleared by the [place]  FDA and has been authorized for detection and/or diagnosis of SARS-CoV-2 by FDA under an Emergency Use Authorization (EUA). This EUA will remain in effect (meaning this test can be used) for the duration of the COVID-19 declaration under Section 564(b)(1) of the Act, 21 U.S.C. section 360bbb-3(b)(1), unless the authorization is terminated or revoked.  Performed at [facility], [address], [place], [place] [address]   MRSA Next Gen by PCR, Nasal     Status: None   Collection Time: 10/21/24  7:47 PM   Specimen: Nasal Mucosa; Nasal Swab  Result Value Ref Range Status   MRSA by PCR Next Gen NOT DETECTED NOT DETECTED Final    Comment: (NOTE) The GeneXpert MRSA Assay (FDA approved for NASAL specimens only), is one component of a comprehensive MRSA colonization surveillance program. It is not intended to diagnose MRSA infection nor to guide or monitor treatment for MRSA infections. Test performance is  not FDA approved in patients less than 60 years old. Performed at [facility], [address], [place], [place] [address]     RADIOLOGY STUDIES/RESULTS: VAS US  UPPER EXT VEIN MAPPING (PRE-OP AVF) Result Date: 10/27/2024 UPPER EXTREMITY VEIN MAPPING Patient Name:  TANA TREFRY Drohan  Date of Exam:   10/27/2024 Medical Rec #: [id number]           Accession #:    [id number] Date of Birth: [birth year]          Patient Gender: F Patient Age:   66 years Exam Location:  [facility] Procedure:      VAS US  UPPER EXT VEIN MAPPING (PRE-OP AVF) Referring Phys: KATHERYN SABA --------------------------------------------------------------------------------  Indications: Pre-access. History: ESRD need for HD access.  Limitations: patient movement and body habitus Comparison Study: No previous exams Performing Technologist: Jody Hill RVT, RDMS  Examination Guidelines: A complete evaluation includes B-mode imaging, spectral Doppler, color Doppler, and power Doppler as needed of all accessible portions of each vessel. Bilateral testing is considered an integral part of a complete examination. Limited examinations for reoccurring indications may be performed as noted. +-----------------+-------------+----------+---------+ Right Cephalic   Diameter (cm)Depth (cm)Findings  +-----------------+-------------+----------+---------+ Shoulder             0.32                         +-----------------+-------------+----------+---------+ Prox upper arm       0.22                         +-----------------+-------------+----------+---------+ Mid upper arm        0.24               branching +-----------------+-------------+----------+---------+ Dist upper arm       0.17                         +-----------------+-------------+----------+---------+ Antecubital fossa    0.20                         +-----------------+-------------+----------+---------+ Prox forearm         0.31  branching +-----------------+-------------+----------+---------+ Mid forearm          0.17                         +-----------------+-------------+----------+---------+ Dist forearm         0.10               branching +-----------------+-------------+----------+---------+ Wrist                0.10                         +-----------------+-------------+----------+---------+ +-----------------+-------------+----------+--------------+ Right Basilic    Diameter (cm)Depth (cm)   Findings    +-----------------+-------------+----------+--------------+ Prox upper arm       0.38        0.76                  +-----------------+-------------+----------+--------------+ Mid upper arm        0.24        0.78                  +-----------------+-------------+----------+--------------+ Dist upper arm       0.25        0.76                  +-----------------+-------------+----------+--------------+ Antecubital fossa    0.22        0.33     branching    +-----------------+-------------+----------+--------------+ Prox forearm         0.11        0.16                  +-----------------+-------------+----------+--------------+ Mid forearm          0.10        0.17     branching    +-----------------+-------------+----------+--------------+ Distal forearm       0.07        0.19                  +-----------------+-------------+----------+--------------+ Wrist                                   not visualized +-----------------+-------------+----------+--------------+ +-----------------+-------------+----------+---------+ Left Cephalic    Diameter (cm)Depth (cm)Findings  +-----------------+-------------+----------+---------+ Shoulder             0.28                         +-----------------+-------------+----------+---------+ Prox upper arm       0.27                         +-----------------+-------------+----------+---------+ Mid upper arm        0.25                          +-----------------+-------------+----------+---------+ Dist upper arm       0.27               branching +-----------------+-------------+----------+---------+ Antecubital fossa    0.37                         +-----------------+-------------+----------+---------+ Prox forearm         0.35               branching +-----------------+-------------+----------+---------+  Mid forearm          0.22               branching +-----------------+-------------+----------+---------+ Dist forearm         0.17                         +-----------------+-------------+----------+---------+ Wrist                0.13                         +-----------------+-------------+----------+---------+ +-----------------+-------------+----------+---------+ Left Basilic     Diameter (cm)Depth (cm)Findings  +-----------------+-------------+----------+---------+ Prox upper arm     0.38/0.22  0.75/0.56 branching +-----------------+-------------+----------+---------+ Mid upper arm        0.31        0.69             +-----------------+-------------+----------+---------+ Dist upper arm       0.24        0.54             +-----------------+-------------+----------+---------+ Antecubital fossa    0.28        0.44   branching +-----------------+-------------+----------+---------+ Prox forearm         0.20        0.28             +-----------------+-------------+----------+---------+ Mid forearm          0.20        0.43             +-----------------+-------------+----------+---------+ Distal forearm       0.20        0.21             +-----------------+-------------+----------+---------+ Wrist                0.16        0.21             +-----------------+-------------+----------+---------+ *See table(s) above for measurements and observations.  Diagnosing physician: Fonda Rim Electronically signed by Fonda Rim on 10/27/2024 at 3:13:57 PM.     Final       LOS: 8 days   Donalda Applebaum, MD  [facility]    To contact the attending provider between 7A-7P or the covering provider during after hours 7P-7A, please log into the web site [email or web address] and access using universal [place] password for that web site. If you do not have the password, please call the hospital operator.  10/29/2024, 11:04 AM

## 2024-10-29 NOTE — Plan of Care (Signed)
  Problem: Education: Goal: Ability to demonstrate management of disease process will improve Outcome: Progressing Goal: Ability to verbalize understanding of medication therapies will improve Outcome: Progressing   Problem: Activity: Goal: Capacity to carry out activities will improve Outcome: Progressing   Problem: Cardiac: Goal: Ability to achieve and maintain adequate cardiopulmonary perfusion will improve Outcome: Progressing   Problem: Education: Goal: Knowledge of General Education information will improve Description: Including pain rating scale, medication(s)/side effects and non-pharmacologic comfort measures Outcome: Progressing   Problem: Health Behavior/Discharge Planning: Goal: Ability to manage health-related needs will improve Outcome: Progressing   Problem: Clinical Measurements: Goal: Ability to maintain clinical measurements within normal limits will improve Outcome: Progressing Goal: Will remain free from infection Outcome: Progressing Goal: Diagnostic test results will improve Outcome: Progressing Goal: Respiratory complications will improve Outcome: Progressing Goal: Cardiovascular complication will be avoided Outcome: Progressing   Problem: Activity: Goal: Risk for activity intolerance will decrease Outcome: Progressing   Problem: Nutrition: Goal: Adequate nutrition will be maintained Outcome: Progressing   Problem: Coping: Goal: Level of anxiety will decrease Outcome: Progressing   Problem: Elimination: Goal: Will not experience complications related to bowel motility Outcome: Progressing Goal: Will not experience complications related to urinary retention Outcome: Progressing   Problem: Pain Managment: Goal: General experience of comfort will improve and/or be controlled Outcome: Progressing   Problem: Safety: Goal: Ability to remain free from injury will improve Outcome: Progressing   Problem: Skin Integrity: Goal: Risk for  impaired skin integrity will decrease Outcome: Progressing   Problem: Education: Goal: Knowledge of disease and its progression will improve Outcome: Progressing Goal: Individualized Educational Video(s) Outcome: Progressing   Problem: Fluid Volume: Goal: Compliance with measures to maintain balanced fluid volume will improve Outcome: Progressing   Problem: Health Behavior/Discharge Planning: Goal: Ability to manage health-related needs will improve Outcome: Progressing   Problem: Nutritional: Goal: Ability to make healthy dietary choices will improve Outcome: Progressing   Problem: Clinical Measurements: Goal: Complications related to the disease process, condition or treatment will be avoided or minimized Outcome: Progressing

## 2024-10-30 ENCOUNTER — Inpatient Hospital Stay (HOSPITAL_COMMUNITY): Admitting: Certified Registered"

## 2024-10-30 ENCOUNTER — Encounter (HOSPITAL_COMMUNITY): Payer: Self-pay | Admitting: Pulmonary Disease

## 2024-10-30 ENCOUNTER — Inpatient Hospital Stay (HOSPITAL_COMMUNITY)

## 2024-10-30 ENCOUNTER — Encounter (HOSPITAL_COMMUNITY)
Admission: EM | Disposition: A | Discharge status: Home / Self Care | Payer: Self-pay | Source: Home / Self Care | Attending: Internal Medicine

## 2024-10-30 DIAGNOSIS — Z992 Dependence on renal dialysis: Secondary | ICD-10-CM | POA: Diagnosis not present

## 2024-10-30 DIAGNOSIS — E1122 Type 2 diabetes mellitus with diabetic chronic kidney disease: Secondary | ICD-10-CM | POA: Diagnosis not present

## 2024-10-30 DIAGNOSIS — J810 Acute pulmonary edema: Secondary | ICD-10-CM | POA: Diagnosis not present

## 2024-10-30 DIAGNOSIS — J9601 Acute respiratory failure with hypoxia: Secondary | ICD-10-CM | POA: Diagnosis not present

## 2024-10-30 DIAGNOSIS — I120 Hypertensive chronic kidney disease with stage 5 chronic kidney disease or end stage renal disease: Secondary | ICD-10-CM | POA: Diagnosis not present

## 2024-10-30 DIAGNOSIS — N186 End stage renal disease: Secondary | ICD-10-CM

## 2024-10-30 DIAGNOSIS — D631 Anemia in chronic kidney disease: Secondary | ICD-10-CM | POA: Diagnosis not present

## 2024-10-30 DIAGNOSIS — Z87891 Personal history of nicotine dependence: Secondary | ICD-10-CM

## 2024-10-30 DIAGNOSIS — N179 Acute kidney failure, unspecified: Secondary | ICD-10-CM | POA: Diagnosis not present

## 2024-10-30 DIAGNOSIS — Z7189 Other specified counseling: Secondary | ICD-10-CM | POA: Diagnosis not present

## 2024-10-30 HISTORY — PX: INSERTION OF DIALYSIS CATHETER: SHX1324

## 2024-10-30 HISTORY — PX: AV FISTULA PLACEMENT: SHX1204

## 2024-10-30 LAB — RENAL FUNCTION PANEL
Albumin: 2.1 g/dL — ABNORMAL LOW (ref 3.5–5.0)
Anion gap: 9 (ref 5–15)
BUN: 38 mg/dL — ABNORMAL HIGH (ref 8–23)
CO2: 26 mmol/L (ref 22–32)
Calcium: 8.1 mg/dL — ABNORMAL LOW (ref 8.9–10.3)
Chloride: 100 mmol/L (ref 98–111)
Creatinine, Ser: 5.15 mg/dL — ABNORMAL HIGH (ref 0.44–1.00)
GFR, Estimated: 8 mL/min — ABNORMAL LOW (ref >60)
Glucose, Bld: 102 mg/dL — ABNORMAL HIGH (ref 70–99)
Phosphorus: 4.3 mg/dL (ref 2.5–4.6)
Potassium: 4 mmol/L (ref 3.5–5.1)
Sodium: 135 mmol/L (ref 135–145)

## 2024-10-30 LAB — CBC
HCT: 20.6 % — ABNORMAL LOW (ref 36.0–46.0)
HCT: 26.6 % — ABNORMAL LOW (ref 36.0–46.0)
Hemoglobin: 6.3 g/dL — CL (ref 12.0–15.0)
Hemoglobin: 8.3 g/dL — ABNORMAL LOW (ref 12.0–15.0)
MCH: 31.3 pg (ref 26.0–34.0)
MCH: 31.4 pg (ref 26.0–34.0)
MCHC: 30.6 g/dL (ref 30.0–36.0)
MCHC: 31.2 g/dL (ref 30.0–36.0)
MCV: 102.5 fL — ABNORMAL HIGH (ref 80.0–100.0)
MCV: 100.8 fL — ABNORMAL HIGH (ref 80.0–100.0)
Platelets: 193 K/uL (ref 150–400)
Platelets: 251 K/uL (ref 150–400)
RBC: 2.01 MIL/uL — ABNORMAL LOW (ref 3.87–5.11)
RBC: 2.64 MIL/uL — ABNORMAL LOW (ref 3.87–5.11)
RDW: 16 % — ABNORMAL HIGH (ref 11.5–15.5)
RDW: 17.2 % — ABNORMAL HIGH (ref 11.5–15.5)
WBC: 7.3 K/uL (ref 4.0–10.5)
WBC: 5.6 K/uL (ref 4.0–10.5)
nRBC: 0 % (ref 0.0–0.2)
nRBC: 0 % (ref 0.0–0.2)

## 2024-10-30 LAB — GLUCOSE, CAPILLARY
Glucose-Capillary: 52 mg/dL — ABNORMAL LOW (ref 70–99)
Glucose-Capillary: 129 mg/dL — ABNORMAL HIGH (ref 70–99)
Glucose-Capillary: 110 mg/dL — ABNORMAL HIGH (ref 70–99)
Glucose-Capillary: 111 mg/dL — ABNORMAL HIGH (ref 70–99)
Glucose-Capillary: 105 mg/dL — ABNORMAL HIGH (ref 70–99)
Glucose-Capillary: 109 mg/dL — ABNORMAL HIGH (ref 70–99)
Glucose-Capillary: 105 mg/dL — ABNORMAL HIGH (ref 70–99)
Glucose-Capillary: 132 mg/dL — ABNORMAL HIGH (ref 70–99)

## 2024-10-30 LAB — POCT I-STAT, CHEM 8
BUN: 38 mg/dL — ABNORMAL HIGH (ref 8–23)
Calcium, Ion: 1.08 mmol/L — ABNORMAL LOW (ref 1.15–1.40)
Chloride: 101 mmol/L (ref 98–111)
Creatinine, Ser: 5.8 mg/dL — ABNORMAL HIGH (ref 0.44–1.00)
Glucose, Bld: 105 mg/dL — ABNORMAL HIGH (ref 70–99)
HCT: 25 % — ABNORMAL LOW (ref 36.0–46.0)
Hemoglobin: 8.5 g/dL — ABNORMAL LOW (ref 12.0–15.0)
Potassium: 4 mmol/L (ref 3.5–5.1)
Sodium: 136 mmol/L (ref 135–145)
TCO2: 27 mmol/L (ref 22–32)

## 2024-10-30 LAB — PREPARE RBC (CROSSMATCH)

## 2024-10-30 SURGERY — ARTERIOVENOUS (AV) FISTULA CREATION
Anesthesia: General | Site: Chest | Laterality: Right

## 2024-10-30 MED ORDER — LIDOCAINE 2% (20 MG/ML) 5 ML SYRINGE
INTRAMUSCULAR | Status: AC
  Filled 2024-10-30: qty 5

## 2024-10-30 MED ORDER — CEFAZOLIN SODIUM-DEXTROSE 2-3 GM-%(50ML) IV SOLR
INTRAVENOUS | Status: DC | PRN
  Administered 2024-10-30: 2 g via INTRAVENOUS

## 2024-10-30 MED ORDER — PHENYLEPHRINE HCL-NACL 20-0.9 MG/250ML-% IV SOLN
INTRAVENOUS | Status: DC | PRN
  Administered 2024-10-30: 40 ug/min via INTRAVENOUS

## 2024-10-30 MED ORDER — OXYCODONE HCL 5 MG PO TABS: 5.0000 mg | ORAL_TABLET | ORAL | Status: DC | PRN

## 2024-10-30 MED ORDER — HEPARIN SODIUM (PORCINE) 1000 UNIT/ML IJ SOLN
INTRAMUSCULAR | Status: DC | PRN
  Administered 2024-10-30: 3000 [IU] via INTRAVENOUS

## 2024-10-30 MED ORDER — FENTANYL CITRATE (PF) 250 MCG/5ML IJ SOLN
INTRAMUSCULAR | Status: DC | PRN
  Administered 2024-10-30 (×3): 25 ug via INTRAVENOUS

## 2024-10-30 MED ORDER — DEXAMETHASONE SOD PHOSPHATE PF 10 MG/ML IJ SOLN
INTRAMUSCULAR | Status: DC | PRN
  Administered 2024-10-30: 5 mg via INTRAVENOUS

## 2024-10-30 MED ORDER — OXYCODONE HCL 5 MG/5ML PO SOLN: 5.0000 mg | Freq: Once | ORAL | Status: DC | PRN

## 2024-10-30 MED ORDER — CHLORHEXIDINE GLUCONATE 0.12 % MT SOLN
OROMUCOSAL | Status: AC
  Administered 2024-10-30: 15 mL via OROMUCOSAL
  Filled 2024-10-30: qty 15

## 2024-10-30 MED ORDER — 0.9 % SODIUM CHLORIDE (POUR BTL) OPTIME
TOPICAL | Status: DC | PRN
  Administered 2024-10-30: 1000 mL

## 2024-10-30 MED ORDER — PROPOFOL 10 MG/ML IV BOLUS
INTRAVENOUS | Status: DC | PRN
  Administered 2024-10-30: 100 mg via INTRAVENOUS

## 2024-10-30 MED ORDER — ONDANSETRON HCL 4 MG/2ML IJ SOLN
INTRAMUSCULAR | Status: DC | PRN
  Administered 2024-10-30: 4 mg via INTRAVENOUS

## 2024-10-30 MED ORDER — CHLORHEXIDINE GLUCONATE 0.12 % MT SOLN: 15.0000 mL | Freq: Once | OROMUCOSAL | Status: AC

## 2024-10-30 MED ORDER — HEPARIN SODIUM (PORCINE) 1000 UNIT/ML IJ SOLN
INTRAMUSCULAR | Status: AC
  Filled 2024-10-30: qty 4

## 2024-10-30 MED ORDER — LIDOCAINE 2% (20 MG/ML) 5 ML SYRINGE
INTRAMUSCULAR | Status: DC | PRN
  Administered 2024-10-30: 60 mg via INTRAVENOUS

## 2024-10-30 MED ORDER — OXYCODONE HCL 5 MG PO TABS: 5.0000 mg | ORAL_TABLET | Freq: Once | ORAL | Status: DC | PRN

## 2024-10-30 MED ORDER — FENTANYL CITRATE (PF) 250 MCG/5ML IJ SOLN
INTRAMUSCULAR | Status: AC
  Filled 2024-10-30: qty 5

## 2024-10-30 MED ORDER — HEPARIN SODIUM (PORCINE) 1000 UNIT/ML IJ SOLN
INTRAMUSCULAR | Status: DC | PRN
  Administered 2024-10-30: 3200 [IU]

## 2024-10-30 MED ORDER — FENTANYL CITRATE (PF) 100 MCG/2ML IJ SOLN: 25.0000 ug | INTRAMUSCULAR | Status: DC | PRN

## 2024-10-30 MED ORDER — ORAL CARE MOUTH RINSE: 15.0000 mL | Freq: Once | OROMUCOSAL | Status: AC

## 2024-10-30 MED ORDER — ONDANSETRON HCL 4 MG/2ML IJ SOLN: 4.0000 mg | Freq: Four times a day (QID) | INTRAMUSCULAR | Status: DC | PRN

## 2024-10-30 MED ORDER — HEPARIN 6000 UNIT IRRIGATION SOLUTION
Status: DC | PRN
  Administered 2024-10-30: 1

## 2024-10-30 MED ORDER — SODIUM CHLORIDE 0.9 % IV SOLN: INTRAVENOUS | Status: DC

## 2024-10-30 MED ORDER — SODIUM CHLORIDE 0.9% IV SOLUTION: Freq: Once | INTRAVENOUS | Status: DC

## 2024-10-30 SURGICAL SUPPLY — 49 items
ARMBAND PINK RESTRICT EXTREMIT (MISCELLANEOUS) ×3 IMPLANT
BAG COUNTER SPONGE SURGICOUNT (BAG) ×3 IMPLANT
BIOPATCH RED 1 DISK 7.0 (GAUZE/BANDAGES/DRESSINGS) ×3 IMPLANT
CANISTER SUCTION 3000ML PPV (SUCTIONS) ×3 IMPLANT
CATH PALINDROME-P 19CM W/VT (CATHETERS) IMPLANT
CATH PALINDROME-P 28CM W/VT (CATHETERS) IMPLANT
CLIP TI MEDIUM 6 (CLIP) ×3 IMPLANT
CLIP TI WIDE RED SMALL 6 (CLIP) ×3 IMPLANT
COVER DOME SNAP 22 D (MISCELLANEOUS) IMPLANT
COVER PROBE W GEL 5X96 (DRAPES) ×3 IMPLANT
COVER SURGICAL LIGHT HANDLE (MISCELLANEOUS) ×3 IMPLANT
DERMABOND ADVANCED .7 DNX12 (GAUZE/BANDAGES/DRESSINGS) ×3 IMPLANT
DRAPE C-ARM 42X72 X-RAY (DRAPES) ×3 IMPLANT
DRAPE CHEST BREAST 15X10 FENES (DRAPES) ×3 IMPLANT
ELECTRODE REM PT RTRN 9FT ADLT (ELECTROSURGICAL) ×3 IMPLANT
GAUZE 4X4 16PLY ~~LOC~~+RFID DBL (SPONGE) ×3 IMPLANT
GLOVE BIOGEL PI IND STRL 7.0 (GLOVE) ×3 IMPLANT
GOWN STRL REUS W/ TWL LRG LVL3 (GOWN DISPOSABLE) ×6 IMPLANT
GOWN STRL REUS W/ TWL XL LVL3 (GOWN DISPOSABLE) ×3 IMPLANT
INSERT FOGARTY SM (MISCELLANEOUS) IMPLANT
KIT BASIN OR (CUSTOM PROCEDURE TRAY) ×3 IMPLANT
KIT PALINDROME-P 55CM (CATHETERS) IMPLANT
KIT TURNOVER KIT B (KITS) ×3 IMPLANT
NDL 18GX1X1/2 (RX/OR ONLY) (NEEDLE) ×3 IMPLANT
NDL HYPO 25GX1X1/2 BEV (NEEDLE) ×3 IMPLANT
NEEDLE 18GX1X1/2 (RX/OR ONLY) (NEEDLE) ×3 IMPLANT
NEEDLE HYPO 25GX1X1/2 BEV (NEEDLE) ×3 IMPLANT
PACK CV ACCESS (CUSTOM PROCEDURE TRAY) ×3 IMPLANT
PACK SRG BSC III STRL LF ECLPS (CUSTOM PROCEDURE TRAY) ×3 IMPLANT
PAD ARMBOARD POSITIONER FOAM (MISCELLANEOUS) ×6 IMPLANT
POWDER SURGICEL 3.0 GRAM (HEMOSTASIS) IMPLANT
SET MICROPUNCTURE 5F STIFF (MISCELLANEOUS) IMPLANT
SLING ARM FOAM STRAP LRG (SOFTGOODS) IMPLANT
SOAP 2 % CHG 4 OZ (WOUND CARE) ×3 IMPLANT
SOLN 0.9% NACL POUR BTL 1000ML (IV SOLUTION) ×3 IMPLANT
SOLN STERILE WATER BTL 1000 ML (IV SOLUTION) ×3 IMPLANT
SUT ETHILON 3 0 PS 1 (SUTURE) ×3 IMPLANT
SUT MNCRL AB 4-0 PS2 18 (SUTURE) ×3 IMPLANT
SUT PROLENE 6 0 BV (SUTURE) ×3 IMPLANT
SUT VIC AB 3-0 SH 27X BRD (SUTURE) ×3 IMPLANT
SYR 10ML LL (SYRINGE) ×3 IMPLANT
SYR 20ML LL LF (SYRINGE) ×6 IMPLANT
SYR 5ML LL (SYRINGE) ×3 IMPLANT
SYR CONTROL 10ML LL (SYRINGE) ×3 IMPLANT
TAPE CLOTH SURG 4X10 WHT LF (GAUZE/BANDAGES/DRESSINGS) IMPLANT
TOWEL GREEN STERILE (TOWEL DISPOSABLE) ×3 IMPLANT
TOWEL GREEN STERILE FF (TOWEL DISPOSABLE) ×6 IMPLANT
UNDERPAD 30X36 HEAVY ABSORB (UNDERPADS AND DIAPERS) ×3 IMPLANT
VASCULAR TIE MINI RED 18IN STL (MISCELLANEOUS) IMPLANT

## 2024-10-30 NOTE — Plan of Care (Signed)
  Problem: Education: Goal: Ability to demonstrate management of disease process will improve Outcome: Progressing Goal: Ability to verbalize understanding of medication therapies will improve Outcome: Progressing   Problem: Activity: Goal: Capacity to carry out activities will improve Outcome: Progressing   Problem: Cardiac: Goal: Ability to achieve and maintain adequate cardiopulmonary perfusion will improve Outcome: Progressing   Problem: Education: Goal: Knowledge of General Education information will improve Description: Including pain rating scale, medication(s)/side effects and non-pharmacologic comfort measures Outcome: Progressing   Problem: Health Behavior/Discharge Planning: Goal: Ability to manage health-related needs will improve Outcome: Progressing   Problem: Clinical Measurements: Goal: Ability to maintain clinical measurements within normal limits will improve Outcome: Progressing Goal: Will remain free from infection Outcome: Progressing Goal: Diagnostic test results will improve Outcome: Progressing Goal: Respiratory complications will improve Outcome: Progressing Goal: Cardiovascular complication will be avoided Outcome: Progressing   Problem: Activity: Goal: Risk for activity intolerance will decrease Outcome: Progressing   Problem: Nutrition: Goal: Adequate nutrition will be maintained Outcome: Progressing   Problem: Coping: Goal: Level of anxiety will decrease Outcome: Progressing   Problem: Elimination: Goal: Will not experience complications related to bowel motility Outcome: Progressing Goal: Will not experience complications related to urinary retention Outcome: Progressing   Problem: Pain Managment: Goal: General experience of comfort will improve and/or be controlled Outcome: Progressing   Problem: Safety: Goal: Ability to remain free from injury will improve Outcome: Progressing   Problem: Skin Integrity: Goal: Risk for  impaired skin integrity will decrease Outcome: Progressing   Problem: Education: Goal: Knowledge of disease and its progression will improve Outcome: Progressing Goal: Individualized Educational Video(s) Outcome: Progressing   Problem: Fluid Volume: Goal: Compliance with measures to maintain balanced fluid volume will improve Outcome: Progressing   Problem: Health Behavior/Discharge Planning: Goal: Ability to manage health-related needs will improve Outcome: Progressing   Problem: Nutritional: Goal: Ability to make healthy dietary choices will improve Outcome: Progressing   Problem: Clinical Measurements: Goal: Complications related to the disease process, condition or treatment will be avoided or minimized Outcome: Progressing

## 2024-10-30 NOTE — Progress Notes (Signed)
 PT Cancellation Note  Patient Details Name: JAMILETT FERRANTE MRN: [id number] DOB: [birth year]   Cancelled Treatment:    Reason Eval/Treat Not Completed: Active bedrest order;Medical issues which prohibited therapy (Pt still on bedrest and per MD, requesting PT to hold until tomorrow.)   Kaid Seeberger 10/30/2024, 10:03 AM

## 2024-10-30 NOTE — Progress Notes (Signed)
 [facility] NEPHROLOGY PROGRESS NOTE  Assessment/ Plan: Pt is a 83 y.o. yo female with new ESRD on dialysis.  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  The first HD was on 11/5 and has been tolerating well.  On and off issue with femoral HD line mainly related with her movement.   -Vascular surgeon planning to take her to the OR today for AVF versus graft and TDC placement. -Plan for dialysis today. -OP HD arranged at [facility] schedule.    # Hypertension/volume: Monitor BP, UF with HD.  # Anemia of CKD: Continue weekly Aranesp, monitor hemoglobin.  A unit of blood transfusion today.  # CHF, optimize volume with dialysis, fluid restriction.  # CKD-MBD: Corrected calcium  level and phosphorus level acceptable.  Also on VDRA.  Subjective: Seen and examined.  No new event.  Currently n.p.o. for vascular procedure today.  Plan for dialysis afterward.  No chest pain or shortness of breath.  Feels tired.  Objective Vital signs in last 24 hours: Vitals:   10/30/24 0630 10/30/24 0740 10/30/24 0800 10/30/24 1000  BP: (!) 164/71 (!) 164/68 (!) 183/65 (!) 165/73  Pulse: 70 71 68 72  Resp: 19 18 19 20   Temp: 98.7 F (37.1 C) 98 F (36.7 C)  98 F (36.7 C)  TempSrc: Oral Oral  Oral  SpO2: 100% 100% 100% 98%  Weight:      Height:       Weight change:   Intake/Output Summary (Last 24 hours) at 10/30/2024 1105 Last data filed at 10/30/2024 1000 Gross per 24 hour  Intake 453 ml  Output --  Net 453 ml       Labs: RENAL PANEL Recent Labs  Lab 10/26/24 0412 10/27/24 0348 10/28/24 0238 10/29/24 0301 10/30/24 0250  NA 133* 138 138 137 135  K 3.6 3.7 4.0 3.7 4.0  CL 99 101 100 100 100  CO2 25 24 23 27 26   GLUCOSE 114* 119* 104* 90 102*  BUN 43* 54* 58* 28* 38*  CREATININE 4.43* 5.33* 6.10* 4.08* 5.15*  CALCIUM  7.7* 8.1* 7.7* 7.9* 8.1*  PHOS 3.7 4.1 4.6 3.8 4.3  ALBUMIN 2.2* 2.3* 2.4* 2.2* 2.1*     Liver Function Tests: Recent Labs  Lab 10/28/24 0238 10/29/24 0301 10/30/24 0250  ALBUMIN 2.4* 2.2* 2.1*   No results for input(s): LIPASE, AMYLASE in the last 168 hours. No results for input(s): AMMONIA in the last 168 hours. CBC: Recent Labs    07/02/24 0819 07/02/24 9177 08/13/24 0830 08/27/24 9177 09/10/24 0809 09/10/24 9188 10/08/24 0854 10/08/24 0906 10/22/24 1336 10/23/24 0324 10/25/24 0421 10/27/24 0348 10/28/24 0815 10/29/24 0301 10/30/24 0250  HGB  --    < >  --    < >  --    < >  --    < >  --    < > 7.1* 7.6* 7.3* 7.1* 6.3*  MCV  --   --   --   --   --   --   --    < >  --    < > 95.6 97.6 100.8* 99.6 102.5*  FERRITIN 226  --  192  --  192  --  281  --  273  --   --   --   --   --   --   TIBC 188*  --  189*  --  209*  --  185*  --  140*  --   --   --   --   --   --   IRON  40  --  64  --  81  --  90  --  18*  --   --   --   --   --   --    < > = values in this interval not displayed.    Cardiac Enzymes: No results for input(s): CKTOTAL, CKMB, CKMBINDEX, TROPONINI in the last 168 hours. CBG: Recent Labs  Lab 10/29/24 1113 10/29/24 1622 10/29/24 1703 10/29/24 2222 10/30/24 0742  GLUCAP 176* 52* 129* 110* 111*    Iron  Studies: No results for input(s): IRON , TIBC, TRANSFERRIN, FERRITIN in the last 72 hours. Studies/Results: No results found.   Medications: Infusions:    Scheduled Medications:  sodium chloride    Intravenous Once   amLODipine   10 mg Oral Daily   aspirin EC  81 mg Oral Daily   atorvastatin   80 mg Oral Daily   calcitRIOL  0.25 mcg Oral Daily   Chlorhexidine  Gluconate Cloth  6 each Topical Q0600   cycloSPORINE  1 drop Both Eyes Daily   [START ON 10/31/2024] darbepoetin (ARANESP) injection - DIALYSIS  100 mcg Subcutaneous Q Fri-1800   feeding supplement (NEPRO CARB STEADY)  237 mL Oral BID BM   ferrous sulfate   325 mg Oral Q breakfast   insulin  aspart  0-5 Units Subcutaneous QHS   insulin  aspart  0-9  Units Subcutaneous TID WC   isosorbide-hydrALAZINE   2 tablet Oral TID   metoprolol  succinate  25 mg Oral Daily   pantoprazole   40 mg Oral Daily   sodium chloride  flush  3 mL Intravenous Q12H    have reviewed scheduled and prn medications.  Physical Exam: General:NAD, frail elderly female Heart:RRR, s1s2 nl Lungs:clear b/l, no crackle Abdomen:soft, Non-tender, non-distended Extremities:No edema Dialysis Access: Femoral groin catheter , no  bleeding.  Erica Whitney 10/30/2024,11:05 AM  LOS: 9 days

## 2024-10-30 NOTE — Progress Notes (Signed)
  Progress Note    10/30/2024 12:31 PM * Day of Surgery *  Subjective: No complaints    Vitals:   10/30/24 1117 10/30/24 1120  BP: (!) 160/67 (!) 157/67  Pulse: 70   Resp: 20   Temp: 98.2 F (36.8 C)   SpO2: 97%     Physical Exam: General: Sitting up in bed, NAD Cardiac: Regular Lungs: Nonlabored Extremities: Palpable left brachial pulse   CBC    Component Value Date/Time   WBC 7.3 10/30/2024 0250   RBC 2.01 (L) 10/30/2024 0250   HGB 8.5 (L) 10/30/2024 1143   HGB 9.6 (L) 02/15/2023 0946   HGB 10.8 (L) 11/15/2017 0759   HCT 25.0 (L) 10/30/2024 1143   HCT 33.1 (L) 11/15/2017 0759   PLT 193 10/30/2024 0250   PLT 253 02/15/2023 0946   PLT 196 11/15/2017 0759   MCV 102.5 (H) 10/30/2024 0250   MCV 90.8 11/15/2017 0759   MCH 31.3 10/30/2024 0250   MCHC 30.6 10/30/2024 0250   RDW 16.0 (H) 10/30/2024 0250   RDW 14.2 11/15/2017 0759   LYMPHSABS 0.4 (L) 10/21/2024 0817   LYMPHSABS 2.0 11/15/2017 0759   MONOABS 0.7 10/21/2024 0817   MONOABS 0.9 11/15/2017 0759   EOSABS 0.0 10/21/2024 0817   EOSABS 0.2 11/15/2017 0759   BASOSABS 0.0 10/21/2024 0817   BASOSABS 0.1 11/15/2017 0759    BMET    Component Value Date/Time   NA 136 10/30/2024 1143   NA 141 11/15/2017 0759   K 4.0 10/30/2024 1143   K 4.4 11/15/2017 0759   CL 101 10/30/2024 1143   CO2 26 10/30/2024 0250   CO2 26 11/15/2017 0759   GLUCOSE 105 (H) 10/30/2024 1143   GLUCOSE 176 (H) 11/15/2017 0759   BUN 38 (H) 10/30/2024 1143   BUN 29.8 (H) 11/15/2017 0759   CREATININE 5.80 (H) 10/30/2024 1143   CREATININE 4.38 (H) 02/27/2023 0918   CREATININE 1.1 11/15/2017 0759   CALCIUM  8.1 (L) 10/30/2024 0250   CALCIUM  10.6 (H) 11/15/2017 0759   GFRNONAA 8 (L) 10/30/2024 0250   GFRNONAA 11 (L) 02/15/2023 0946   GFRNONAA 17 (L) 01/27/2021 0950   GFRAA 20 (L) 01/27/2021 0950    INR    Component Value Date/Time   INR 1.2 01/26/2023 0250     Intake/Output Summary (Last 24 hours) at 10/30/2024 1231 Last  data filed at 10/30/2024 1000 Gross per 24 hour  Intake 453 ml  Output --  Net 453 ml      Assessment/Plan:  83 y.o. female with ESRD   - Plan for L arm HD access creation and TDC placement today. Risks and benefits reviewed and she elects to proceed.

## 2024-10-30 NOTE — Op Note (Signed)
 OPERATIVE NOTE  PROCEDURE:   Intraoperative left arm vein mapping left arm brachiocephalic AVF creation Ultrasound and fluoroscopic guided right internal jugular TDC placement  PRE-OPERATIVE DIAGNOSIS: ESRD  POST-OPERATIVE DIAGNOSIS: same as above   SURGEON: Norman GORMAN Serve MD  ASSISTANT(S): Ahmed Holster, PA  Given the complexity of the case,  the assistant was necessary in order to expedient the procedure and safely perform the technical aspects of the operation.  The assistant provided traction and countertraction to assist with exposure of the artery and vein.  They also assisted with suture ligation of multiple venous branches.  They played a critical role in the anastomosis. These skills, especially following the Prolene suture for the anastomosis, could not have been adequately performed by a scrub tech assistant.   ANESTHESIA: general  ESTIMATED BLOOD LOSS: 10 cc  FINDING(S): Sufficiently sized left arm cephalic vein throughout its course Sufficiently sized left brachial artery Palpable and doppler thrill in AVF with multiphasic radial and ulnar artery on completion  SPECIMEN(S):  none  INDICATIONS:   Erica Whitney is a 83 y.o. female with ESRD. The patient is currently on dialysis. They were seen in consult for evaluation of hemodialysis access. The risks and benefits of access creation were reviewed including: need for additional procedures, need for additional creations, steal, ischemia monomelic neuropathy, failure of access, and bleeding. The patient expressed understand and is willing to proceed.    DESCRIPTION: The patient was brought to the operating room positioned supine on operating room table.  The neck and left arm were prepped and draped in the usual sterile fashion.  Anesthesia was induced, preoperative antibiotics were administered and a timeout was performed. Using ultrasound guidance the right internal jugular vein was accessed with micropuncture  technique.  Through the micropuncture sheath, the guidewire was advanced into the superior vena cava.  A small incision was made around the skin access point.  The access point was serially dilated under direct fluoroscopic guidance.  A peel-away sheath was introduced into the superior vena cava under fluoroscopic guidance.  A counterincision was made in the chest under the clavicle.  A 19 cm tunnel dialysis catheter was then tunneled under the skin, over the clavicle into the incision in the neck.  The tunneling device was removed and the catheter fed through the peel-away sheath into the superior vena cava.  The peel-away sheath was removed and the catheter gently pulled back.  Adequate position was confirmed with x-ray.  The catheter was tested and found to aspirate and flush with ease.    The catheter was sutured to the skin and the neck incision was closed with 4-0 Monocryl.  The catheter was then capped and heparin locked.  We the began with ultrasound mapping of the brachial artery and cephalic vein, which demonstrated sufficient size at the antecubital fossa for arteriovenous fistula.  A transverse incision was made 1 finger breadth below the elbow creese in the antecubital fossa. The  cephalic vein was isolated for 3 cm in length. Next the aponeurosis was partially released and the brachial artery secured with a vessel loop. The patient was heparinized. The cephalic vein was transected and ligated distally with a 2-0 silk and vascular clip. The vein was dilated and flushed with heparin saline. Vascular clamps were placed proximally and distally on the brachial artery and an approximate 6 mm arteriotomy was created on the brachial artery. This was flushed with heparin saline.  An anastomosis was created in end to side fashion  on the brachial artery using running 6-0 Prolene suture. Prior to completing the anastomosis, the vessels were flushed and the suture line was tied down. There was an excellent  thrill in the cephalic vein from the anastomosis to the mid upper arm. The patient had a multiphasic radial signal and had an excellent doppler signal in the fistula. The incision was irrigated and hemostasis achieved with cautery and suture. The deeper tissue was closed with 3-0 Vicryl and the skin closed with 4-0 Monocryl. Dermabond was applied to the incisions. The patient was transferred to PACU in stable condition.  COMPLICATIONS: none apparent  CONDITION: stable  Norman GORMAN Serve MD [facility] Phone Number: [phone] 10/30/2024 2:26 PM

## 2024-10-30 NOTE — Inpatient Diabetes Management (Signed)
 Inpatient Diabetes Program Recommendations  AACE/ADA: New Consensus Statement on Inpatient Glycemic Control (2015)  Target Ranges:  Prepandial:   less than 140 mg/dL      Peak postprandial:   less than 180 mg/dL (1-2 hours)      Critically ill patients:  140 - 180 mg/dL   Lab Results  Component Value Date   GLUCAP 105 (H) 10/30/2024   HGBA1C 4.4 (L) 01/26/2023    Review of Glycemic Control  Latest Reference Range & Units 10/29/24 16:22 10/29/24 17:03 10/29/24 22:22 10/30/24 07:42 10/30/24 11:18  Glucose-Capillary 70 - 99 mg/dL 52 (L) 870 (H) 889 (H) 111 (H) 105 (H)  (L): Data is abnormally low (H): Data is abnormally high Diabetes history: Type 2 DM Outpatient Diabetes medications: none Current orders for Inpatient glycemic control: Novolog  0-9 units TID & HS  Inpatient Diabetes Program Recommendations:    Noted hypoglycemia following Novolog  2 units. Of note, given >1 hr past previous CBG.  Consider decreasing correction to Novolog  0-6 units TID.   Thanks, Tinnie Minus, MSN, RNC-OB Diabetes Coordinator [phone] (8a-5p)

## 2024-10-30 NOTE — Anesthesia Preprocedure Evaluation (Signed)
 Anesthesia Evaluation  Patient identified by MRN, date of birth, ID band Patient awake    Reviewed: Allergy & Precautions, H&P , NPO status , Patient's Chart, lab work & pertinent test results  Airway Mallampati: II   Neck ROM: full    Dental   Pulmonary former smoker   breath sounds clear to auscultation       Cardiovascular hypertension,  Rhythm:regular Rate:Normal     Neuro/Psych  PSYCHIATRIC DISORDERS  Depression       GI/Hepatic PUD,,,  Endo/Other  diabetes, Type 2    Renal/GU ESRFRenal disease     Musculoskeletal  (+) Arthritis ,    Abdominal   Peds  Hematology   Anesthesia Other Findings   Reproductive/Obstetrics H/o breast CA                              Anesthesia Physical Anesthesia Plan  ASA: 3  Anesthesia Plan: General   Post-op Pain Management:    Induction: Intravenous  PONV Risk Score and Plan: 3 and Ondansetron , Dexamethasone  and Treatment may vary due to age or medical condition  Airway Management Planned: LMA  Additional Equipment:   Intra-op Plan:   Post-operative Plan: Extubation in OR  Informed Consent: I have reviewed the patients History and Physical, chart, labs and discussed the procedure including the risks, benefits and alternatives for the proposed anesthesia with the patient or authorized representative who has indicated his/her understanding and acceptance.     Dental advisory given  Plan Discussed with: CRNA, Anesthesiologist and Surgeon  Anesthesia Plan Comments:         Anesthesia Quick Evaluation

## 2024-10-30 NOTE — Discharge Instructions (Signed)
 [facility]  Discharge Instructions  AV Fistula or Graft Surgery for Dialysis Access  Please refer to the following instructions for your post-procedure care. Your surgeon or physician assistant will discuss any changes with you.  Activity  You may drive the day following your surgery, if you are comfortable and no longer taking prescription pain medication. Resume full activity as the soreness in your incision resolves.  Bathing/Showering  You may shower after you go home. Keep your incision dry for 48 hours. Do not soak in a bathtub, hot tub, or swim until the incision heals completely. You may not shower if you have a hemodialysis catheter.  Incision Care  Clean your incision with mild soap and water after 48 hours. Pat the area dry with a clean towel. You do not need a bandage unless otherwise instructed. Do not apply any ointments or creams to your incision. You may have skin glue on your incision. Do not peel it off. It will come off on its own in about one week. Your arm may swell a bit after surgery. To reduce swelling use pillows to elevate your arm so it is above your heart. Your doctor will tell you if you need to lightly wrap your arm with an ACE bandage.  Diet  Resume your normal diet. There are not special food restrictions following this procedure. In order to heal from your surgery, it is CRITICAL to get adequate nutrition. Your body requires vitamins, minerals, and protein. Vegetables are the best source of vitamins and minerals. Vegetables also provide the perfect balance of protein. Processed food has little nutritional value, so try to avoid this.  Medications  Resume taking all of your medications. If your incision is causing pain, you may take over-the counter pain relievers such as acetaminophen  (Tylenol ). If you were prescribed a stronger pain medication, please be aware these medications can cause nausea and constipation. Prevent  nausea by taking the medication with a snack or meal. Avoid constipation by drinking plenty of fluids and eating foods with high amount of fiber, such as fruits, vegetables, and grains.  Do not take Tylenol  if you are taking prescription pain medications.  Follow up Your surgeon may want to see you in the office following your access surgery. If so, this will be arranged at the time of your surgery.  Please call us  immediately for any of the following conditions:  Increased pain, redness, drainage (pus) from your incision site Fever of 101 degrees or higher Severe or worsening pain at your incision site Hand pain or numbness.  Reduce your risk of vascular disease:  Stop smoking. If you would like help, call [facility] at 1-800-QUIT-NOW (986-421-2141) or [place] at 579-379-1105  Manage your cholesterol Maintain a desired weight Control your diabetes Keep your blood pressure down  Dialysis  It will take several weeks to several months for your new dialysis access to be ready for use. Your surgeon will determine when it is okay to use it. Your nephrologist will continue to direct your dialysis. You can continue to use your Permcath until your new access is ready for use.   10/30/2024 Erica Whitney [id number] Mar 16, 1941  Surgeon(s): Pearline Norman RAMAN, MD  Procedure(s): LEFT BRACHIOCEPHALIC ARTERIOVENOUS FISTULA CREATION RIGHT INTERNAL JUGULAR DIALYSIS CATHETER INSERTION   May stick graft immediately   May stick graft on designated area only:   x Do not stick fistula for 12 weeks    If you have any questions, please  call the office at [phone].

## 2024-10-30 NOTE — Anesthesia Procedure Notes (Signed)
 Procedure Name: Intubation Date/Time: 10/30/2024 12:45 PM  Performed by: Worth Catherene Flores, CRNAPre-anesthesia Checklist: Patient identified, Emergency Drugs available, Suction available and Patient being monitored Patient Re-evaluated:Patient Re-evaluated prior to induction Oxygen Delivery Method: Circle system utilized Preoxygenation: Pre-oxygenation with 100% oxygen Induction Type: IV induction Ventilation: Mask ventilation without difficulty LMA: LMA inserted LMA Size: 3.0 Tube type: Oral Number of attempts: 1 Airway Equipment and Method: Oral airway Placement Confirmation: positive ETCO2 and breath sounds checked- equal and bilateral Tube secured with: Tape Dental Injury: Teeth and Oropharynx as per pre-operative assessment

## 2024-10-30 NOTE — Progress Notes (Signed)
 Hgb down to 6.3 this morning. Plan to transfuse 1 unit RBCs.

## 2024-10-30 NOTE — Progress Notes (Signed)
 Erica Whitney [phone] [facility] liaison note:   This is a current patient with [facility], followed for outpatient palliative care.   [facility] will continue to follow for discharge disposition.   Please call with any OPP or hospice related questions or concerns.   Thank you, Greig Basket, BSN, RN [phone]

## 2024-10-30 NOTE — TOC Progression Note (Signed)
 Transition of Care [facility]) - Progression Note    Patient Details  Name: Erica Whitney MRN: [id number] Date of Birth: 11/16/1941  [facility] of Care [facility]) CM/SW Contact  Inocente GORMAN Kindle, LCSW Phone Number: 10/30/2024, 9:26 AM  Clinical Narrative:    Inpatient Care Management continuing to follow.    Expected Discharge Plan: Home w Home Health Services Barriers to Discharge: Continued Medical Work up               Expected Discharge Plan and Services   Discharge Planning Services: CM Consult Post Acute Care Choice: Home Health Living arrangements for the past 2 months: Apartment                 DME Arranged: N/A DME Agency: NA       HH Arranged: PT, OT, Nurse's Aide HH Agency: [facility] Date [facility] Agency Contacted: 10/27/24 Time HH Agency Contacted: 1041 Representative spoke with at [facility] Agency: Darleene Gowda   Social Drivers of Health (SDOH) Interventions SDOH Screenings   Food Insecurity: No Food Insecurity (10/22/2024)  Housing: Low Risk  (10/22/2024)  Transportation Needs: No Transportation Needs (10/22/2024)  Utilities: Not At Risk (10/22/2024)  Social Connections: Socially Isolated (10/22/2024)  Tobacco Use: Medium Risk (10/21/2024)    Readmission Risk Interventions     No data to display

## 2024-10-30 NOTE — Transfer of Care (Signed)
 Immediate Anesthesia Transfer of Care Note  Patient: Erica Whitney  Procedure(s) Performed: LEFT BRACHIOCEPHALIC ARTERIOVENOUS FISTULA CREATION (Left: Arm Upper) RIGHT INTERNAL JUGULAR DIALYSIS CATHETER INSERTION (Right: Chest)  Patient Location: PACU  Anesthesia Type:General  Level of Consciousness: awake and drowsy  Airway & Oxygen Therapy: Patient Spontanous Breathing and Patient connected to face mask oxygen  Post-op Assessment: Report given to RN and Post -op Vital signs reviewed and stable  Post vital signs: Reviewed and stable  Last Vitals:  Vitals Value Taken Time  BP 128/54 10/30/24 14:45  Temp    Pulse 63 10/30/24 14:47  Resp 15 10/30/24 14:47  SpO2 100 % 10/30/24 14:47  Vitals shown include unfiled device data.  Last Pain:  Vitals:   10/30/24 1134  TempSrc:   PainSc: 0-No pain      Patients Stated Pain Goal: 0 (10/27/24 0109)  Complications: No notable events documented.

## 2024-10-30 NOTE — Progress Notes (Signed)
 PROGRESS NOTE        PATIENT DETAILS Name: Erica Whitney Age: 83 y.o. Sex: female Date of Birth: 25-Dec-1940 Admit Date: 10/21/2024 Admitting Physician Harden Jude GAILS, MD PCP:[facility]  Brief Summary: Patient is a 83 y.o.  female with history of CKD 5-who was brought to the ED with shortness of breath/confusion/uncontrolled hypertension-she was found to have acute pulmonary edema in the setting of progression to ESRD.  She was initially admitted to the hospitalist service but became agitated-ripped off BiPAP-had uncontrolled hypertension-requiring initiation of nitroglycerin drip-and transferred to the ICU.  Per nephrology team-patient had not wanted dialysis as an outpatient-however patient was not able to make her own decisions due to encephalopathy-family elected to start hemodialysis.  She subsequently improved-and was transferred to [facility] on 11/7.  Significant events: 11/4>> admit to [facility]-but transferred to ICU due to agitation-nitroglycerin drip 11/5>> started on HD 11/7>> transferred to [facility].  Significant studies: 11/4>> chest x-ray: Findings compatible with interstitial edema 11/4>> echo: EF 45-50%,+ wall motion abnormality, moderately elevated pulmonary artery systolic pressure.  Severe MR, moderate TR  Significant microbiology data: 11/4>> COVID/influenza/RSV PCR: Negative  Procedures: 11/4>> temporary right groin dialysis catheter by PCCM.  Consults: Nephrology cardiology PCCM Palliative care Vascular surgery  Subjective: Unchanged-no major issues overnight.  Objective: Vitals: Blood pressure (!) 157/67, pulse 70, temperature 98.2 F (36.8 C), temperature source Oral, resp. rate 20, height 5' 3 (1.6 m), weight 48.2 kg, SpO2 97%.   Exam: Gen Exam:Alert awake-not in any distress HEENT:atraumatic, normocephalic Chest: B/L clear to auscultation anteriorly CVS:S1S2 regular Abdomen:soft non tender, non distended Extremities:no  edema Neurology: Non focal-generalized weakness. 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  Nephrology/palliative care engaging family/patient-after extra discussion-plans are now to pursue [facility] catheter/AV fistula-scheduled for tomorrow by vascular surgery.  Clipping process for outpatient HD underway.    Acute metabolic encephalopathy Secondary to uremia Although encephalopathy markedly better-slow to respond but does respond appropriately Delirium precautions.  Hypertensive crisis Required ICU transfer nitroglycerin infusion Blood pressure now much better after HD Continue amlodipine , metoprolol , BiDil  Elevated troponin Doubt type I non-STEMI Suspect related to demand ischemia/type II non-STEMI. Echo as above with mildly suppressed EF and some regional wall motion abnormality Cardiology ongoing-medically managed at this point.  Normocytic anemia Secondary to critical illness and anemia related to ESRD Defer Aranesp/iron  therapies to nephrology service Drop in hemoglobin without any evidence of overt GI bleeding-being transfused 1 unit of PRBC-follow  CBC.  Thrombocytopenia Mild Suspect related to uremia Hopefully will improve  with supportive care-continue to follow trend  Hypokalemia Repleted-recheck periodically  DM-2  CBG stable SSI  Recent Labs    10/29/24 2222 10/30/24 0742 10/30/24 1118  GLUCAP 110* 111* 105*     History of depression Apparently no longer taking Zoloft  or Remeron  per home MAR. Supportive care  Insomnia Trazodone    Nutrition Status: Nutrition Problem: Severe Malnutrition Etiology: chronic illness Signs/Symptoms: percent weight loss, severe muscle depletion, moderate fat depletion Percent weight loss: 8 % (in 1 month) Interventions: Nepro shake ;  Code status:   Code Status: Limited: Do not attempt resuscitation (DNR) -DNR-LIMITED -Do Not Intubate/DNI    DVT Prophylaxis: Place and maintain sequential compression device Start: 10/22/24 0949 SCDs Start: 10/21/24 1948   Family Communication: Daughter at bedside 11/9-none at bedside this morning   Disposition Plan: Status is: Inpatient Remains inpatient appropriate because: Severity of illness   Planned Discharge Destination:Home health versus SNF   Diet: Diet Order             Diet NPO time specified  Diet effective midnight                     Antimicrobial agents: Anti-infectives (From admission, onward)    Start     Dose/Rate Route Frequency Ordered Stop   10/23/24 1100  doxycycline (VIBRA-TABS) tablet 100 mg        100 mg Oral Every 12 hours 10/23/24 1005 10/27/24 2136        MEDICATIONS: Scheduled Meds:  [MAR Hold] sodium chloride    Intravenous Once   [MAR Hold] amLODipine   10 mg Oral Daily   [MAR Hold] aspirin EC  81 mg Oral Daily   [MAR Hold] atorvastatin   80 mg Oral Daily   [MAR Hold] calcitRIOL  0.25 mcg Oral Daily   [MAR Hold] Chlorhexidine  Gluconate Cloth  6 each Topical Q0600   [MAR Hold] cycloSPORINE  1 drop Both Eyes Daily   [MAR Hold] darbepoetin (ARANESP) injection - DIALYSIS  100 mcg Subcutaneous Q Fri-1800   [MAR Hold] feeding supplement (NEPRO CARB STEADY)  237 mL Oral  BID BM   [MAR Hold] ferrous sulfate   325 mg Oral Q breakfast   [MAR Hold] insulin  aspart  0-5 Units Subcutaneous QHS   [MAR Hold] insulin  aspart  0-9 Units Subcutaneous TID WC   [MAR Hold] isosorbide-hydrALAZINE   2 tablet Oral TID   [MAR Hold] metoprolol  succinate  25 mg Oral Daily   [MAR Hold] pantoprazole   40 mg Oral Daily   [MAR Hold] sodium chloride  flush  3 mL Intravenous Q12H   Continuous Infusions:  sodium chloride  10 mL/hr at 10/30/24 1136     PRN Meds:.[MAR Hold] acetaminophen  **OR** [MAR Hold] acetaminophen , [MAR Hold] albuterol , [MAR Hold] artificial tears, [MAR Hold] fentaNYL  (SUBLIMAZE ) injection, [MAR Hold] labetalol , [MAR Hold] senna-docusate, [MAR Hold] traZODone    I have personally reviewed following labs and imaging studies  LABORATORY DATA: CBC: Recent Labs  Lab 10/25/24 0421 10/27/24 0348 10/28/24 0815 10/29/24 0301 10/30/24 0250 10/30/24 1143  WBC 5.5 7.7 7.5 7.0 7.3  --   HGB 7.1* 7.6* 7.3* 7.1* 6.3* 8.5*  HCT 21.8* 24.0* 23.8* 22.9* 20.6* 25.0*  MCV 95.6 97.6 100.8* 99.6 102.5*  --   PLT 88* 132* 172 188 193  --     Basic Metabolic Panel: Recent Labs  Lab 10/26/24 0412 10/27/24 0348 10/28/24 0238 10/29/24 0301 10/30/24 0250 10/30/24 1143  NA 133* 138  138 137 135 136  K 3.6 3.7 4.0 3.7 4.0 4.0  CL 99 101 100 100 100 101  CO2 25 24 23 27 26   --   GLUCOSE 114* 119* 104* 90 102* 105*  BUN 43* 54* 58* 28* 38* 38*  CREATININE 4.43* 5.33* 6.10* 4.08* 5.15* 5.80*  CALCIUM  7.7* 8.1* 7.7* 7.9* 8.1*  --   PHOS 3.7 4.1 4.6 3.8 4.3  --     GFR: Estimated Creatinine Clearance: 5.8 mL/min (A) (by C-G formula based on SCr of 5.8 mg/dL (H)).  Liver Function Tests: Recent Labs  Lab 10/26/24 0412 10/27/24 0348 10/28/24 0238 10/29/24 0301 10/30/24 0250  ALBUMIN 2.2* 2.3* 2.4* 2.2* 2.1*   No results for input(s): LIPASE, AMYLASE in the last 168 hours. No results for input(s): AMMONIA in the last 168 hours.  Coagulation Profile: No  results for input(s): INR, PROTIME in the last 168 hours.  Cardiac Enzymes: No results for input(s): CKTOTAL, CKMB, CKMBINDEX, TROPONINI in the last 168 hours.  BNP (last 3 results) No results for input(s): PROBNP in the last 8760 hours.  Lipid Profile: No results for input(s): CHOL, HDL, LDLCALC, TRIG, CHOLHDL, LDLDIRECT in the last 72 hours.  Thyroid  Function Tests: No results for input(s): TSH, T4TOTAL, FREET4, T3FREE, THYROIDAB in the last 72 hours.  Anemia Panel: No results for input(s): VITAMINB12, FOLATE, FERRITIN, TIBC, IRON , RETICCTPCT in the last 72 hours.   Urine analysis:    Component Value Date/Time   COLORURINE YELLOW 01/26/2023 0815   APPEARANCEUR CLEAR 01/26/2023 0815   LABSPEC 1.011 01/26/2023 0815   PHURINE 5.0 01/26/2023 0815   GLUCOSEU NEGATIVE 01/26/2023 0815   HGBUR NEGATIVE 01/26/2023 0815   BILIRUBINUR NEGATIVE 01/26/2023 0815   KETONESUR NEGATIVE 01/26/2023 0815   PROTEINUR 100 (A) 01/26/2023 0815   UROBILINOGEN 0.2 01/30/2012 1215   NITRITE NEGATIVE 01/26/2023 0815   LEUKOCYTESUR NEGATIVE 01/26/2023 0815    Sepsis Labs: Lactic Acid, Venous    Component Value Date/Time   LATICACIDVEN 0.7 01/26/2023 0556    MICROBIOLOGY: Recent Results (from the past 240 hours)  Resp panel by RT-PCR (RSV, Flu A&B, Covid) Anterior Nasal Swab     Status: None   Collection Time: 10/21/24  8:06 AM   Specimen: Anterior Nasal Swab  Result Value Ref Range Status   SARS Coronavirus 2 by RT PCR NEGATIVE NEGATIVE Final   Influenza A by PCR NEGATIVE NEGATIVE Final   Influenza B by PCR NEGATIVE NEGATIVE Final    Comment: (NOTE) The Xpert Xpress SARS-CoV-2/FLU/RSV plus assay is intended as an aid in the diagnosis of influenza from Nasopharyngeal swab specimens and should not be used as a sole basis for treatment. Nasal washings and aspirates are unacceptable for Xpert Xpress SARS-CoV-2/FLU/RSV testing.  Fact Sheet for  Patients: bloggercourse.com  Fact Sheet for Healthcare Providers: seriousbroker.it  This test is not yet approved or cleared by the [place]  FDA and has been authorized for detection and/or diagnosis of SARS-CoV-2 by FDA under an Emergency Use Authorization (EUA). This EUA will remain in effect (meaning this test can be used) for the duration of the COVID-19 declaration under Section 564(b)(1) of the Act, 21 U.S.C. section 360bbb-3(b)(1), unless the authorization is terminated or revoked.     Resp Syncytial Virus by PCR NEGATIVE NEGATIVE Final    Comment: (NOTE) Fact Sheet for Patients: bloggercourse.com  Fact Sheet for Healthcare Providers: seriousbroker.it  This test is not yet approved or cleared by the [place]  FDA and has been authorized for  detection and/or diagnosis of SARS-CoV-2 by FDA under an Emergency Use Authorization (EUA). This EUA will remain in effect (meaning this test can be used) for the duration of the COVID-19 declaration under Section 564(b)(1) of the Act, 21 U.S.C. section 360bbb-3(b)(1), unless the authorization is terminated or revoked.  Performed at [facility], [address], [place], [place] [address]   MRSA Next Gen by PCR, Nasal     Status: None   Collection Time: 10/21/24  7:47 PM   Specimen: Nasal Mucosa; Nasal Swab  Result Value Ref Range Status   MRSA by PCR Next Gen NOT DETECTED NOT DETECTED Final    Comment: (NOTE) The GeneXpert MRSA Assay (FDA approved for NASAL specimens only), is one component of a comprehensive MRSA colonization surveillance program. It is not intended to diagnose MRSA infection nor to guide or monitor treatment for MRSA infections. Test performance is not FDA approved in patients less than 46 years old. Performed at [facility], [address], [place], [place] [address]     RADIOLOGY  STUDIES/RESULTS: No results found.     LOS: 9 days   Donalda Applebaum, MD  [facility]    To contact the attending provider between 7A-7P or the covering provider during after hours 7P-7A, please log into the web site [email or web address] and access using universal  password for that web site. If you do not have the password, please call the hospital operator.  10/30/2024, 11:51 AM

## 2024-10-31 ENCOUNTER — Encounter (HOSPITAL_COMMUNITY): Payer: Self-pay | Admitting: Vascular Surgery

## 2024-10-31 ENCOUNTER — Encounter (HOSPITAL_COMMUNITY): Payer: Self-pay | Admitting: Nephrology

## 2024-10-31 ENCOUNTER — Other Ambulatory Visit (HOSPITAL_COMMUNITY): Payer: Self-pay

## 2024-10-31 DIAGNOSIS — J810 Acute pulmonary edema: Secondary | ICD-10-CM | POA: Diagnosis not present

## 2024-10-31 DIAGNOSIS — Z7189 Other specified counseling: Secondary | ICD-10-CM | POA: Diagnosis not present

## 2024-10-31 DIAGNOSIS — N179 Acute kidney failure, unspecified: Secondary | ICD-10-CM | POA: Diagnosis not present

## 2024-10-31 DIAGNOSIS — Z48812 Encounter for surgical aftercare following surgery on the circulatory system: Secondary | ICD-10-CM

## 2024-10-31 DIAGNOSIS — J9601 Acute respiratory failure with hypoxia: Secondary | ICD-10-CM | POA: Diagnosis not present

## 2024-10-31 LAB — GLUCOSE, CAPILLARY
Glucose-Capillary: 112 mg/dL — ABNORMAL HIGH (ref 70–99)
Glucose-Capillary: 89 mg/dL (ref 70–99)
Glucose-Capillary: 157 mg/dL — ABNORMAL HIGH (ref 70–99)
Glucose-Capillary: 78 mg/dL (ref 70–99)
Glucose-Capillary: 151 mg/dL — ABNORMAL HIGH (ref 70–99)

## 2024-10-31 LAB — BPAM RBC
Blood Product Expiration Date: 202511152359
ISSUE DATE / TIME: 202511130604
Unit Type and Rh: 9500

## 2024-10-31 LAB — CBC
HCT: 24.1 % — ABNORMAL LOW (ref 36.0–46.0)
Hemoglobin: 7.7 g/dL — ABNORMAL LOW (ref 12.0–15.0)
MCH: 31.8 pg (ref 26.0–34.0)
MCHC: 32 g/dL (ref 30.0–36.0)
MCV: 99.6 fL (ref 80.0–100.0)
Platelets: 226 K/uL (ref 150–400)
RBC: 2.42 MIL/uL — ABNORMAL LOW (ref 3.87–5.11)
RDW: 16.8 % — ABNORMAL HIGH (ref 11.5–15.5)
WBC: 7.5 K/uL (ref 4.0–10.5)
nRBC: 0 % (ref 0.0–0.2)

## 2024-10-31 LAB — TYPE AND SCREEN
ABO/RH(D): O POS
Antibody Screen: NEGATIVE
Unit division: 0

## 2024-10-31 MED ORDER — ISOSORB DINITRATE-HYDRALAZINE 20-37.5 MG PO TABS
2.0000 | ORAL_TABLET | Freq: Three times a day (TID) | ORAL | 2 refills | Status: DC
  Filled 2024-10-31: qty 180, 30d supply, fill #0

## 2024-10-31 MED ORDER — ASPIRIN 81 MG PO TBEC
81.0000 mg | DELAYED_RELEASE_TABLET | Freq: Every day | ORAL | 12 refills | Status: AC
  Filled 2024-10-31: qty 30, 30d supply, fill #0

## 2024-10-31 MED ORDER — ATORVASTATIN CALCIUM 80 MG PO TABS
80.0000 mg | ORAL_TABLET | Freq: Every day | ORAL | 2 refills | Status: AC
  Filled 2024-10-31: qty 30, 30d supply, fill #0

## 2024-10-31 NOTE — Progress Notes (Signed)
 New Dialysis Start    Patient identified as new dialysis start. Kidney Education packet assembled and given. Discussed the following items with daughter:     Current medications and possible changes once started:  Discussed that patient's medications may change over time.  Ex; hypertension medications and diabetes medication.  Nephrologists will adjust as needed.   Fluid restrictions reviewed:  32 oz daily goal:  All liquids count; soups, ice, jello, fruits. Will also refer dietitian.   Phosphorus and potassium: Handout given showing high potassium and phosphorus foods.  Alternative food and drink options given. Will also refer dietitian.   Family support:  Spoke with daughter Darrick over the phone regarding education packet and dialysis information.   Outpatient Clinic Resources:  Discussed roles of Outpatient clinic staff and advised to make a list of needs, if any, to talk with outpatient staff if needed.   Care plan schedule: Informed patient of Care Plans in outpatient setting and to participate in the care plan.  An invitation would be given from outpatient clinic.    Dialysis Access Options:  Reviewed access options with patient. Discussed in detail about care at home with new AVG & AVF. Reviewed checking bruit and thrill. If dialysis catheter present, educated that patient could not take showers.  Catheter dressing changes were to be done by outpatient clinic staff only.    Called daughter Darrick after visiting patient to go over education packet, left packet in room for her. Information about [facility] and AVF care added to AVS as well. Will continue to round on patient during admission.    Alfonso Heckler Dialysis Nurse Coordinator [phone] Secure Chat

## 2024-10-31 NOTE — Progress Notes (Addendum)
 Notified by MD that pt may be stable enough to start at out-pt HD clinic on Tuesday 11/18.  Contacted clinic Erica Whitney to inform of this, they confirmed pt could start on Tuesday 11/18. Time would be different for 1st appt, she would need to arrive on Tuesday at 8 am and run until 12-12:30 likely.   After first appointment, her finalized schedule at this clinic will be every TTS at 0545 am. Navigator has updated care team including MD, nephrology and SW/CM. Will call daughter and confirm these details, as daughter is transporting pt to appts. Will update AVS and continue to assist.   Erica Whitney Dialysis Navigator [phone]  Addendum 1033 am Contacted daughter to inform of first appointment and schedule details. Pt daughter had no questions at this time and is agreeable, stating she will transport pt.

## 2024-10-31 NOTE — Progress Notes (Signed)
  Daily Progress Note   Patient Name: Erica Whitney       Date: 10/31/2024 DOB: [birth year]  Age: 83 y.o. MRN#: [id number] Attending Physician: Raenelle Donalda HERO, MD Primary Care Physician: [facility] Admit Date: 10/21/2024 Length of Stay: 10 days  Reason for Consultation/Follow-up: Establishing goals of care  Subjective:  Subjective: EMR reviewed including personal review of recent notes from hospitalist, palliative care, nephrology, and vascular surgery.  Patient assessed at the bedside. She was walking with therapist in the hall, utilizing a walker. In good spirits and without complaints after fistula placement yesterday. Pending discharge home tomorrow.   Objective:   Vital Signs:  BP (!) 118/55   Pulse 70   Temp 98.8 F (37.1 C) (Oral)   Resp 18   Ht 5' 3 (1.6 m)   Wt 45 kg   SpO2 97%   BMI 17.57 kg/m   Physical Exam: General: Frail-appearing.  No acute distress HENT: Atraumatic normocephalic CV: Regular rate  Lungs: No increased work of breathing Skin: Warm and dry Neuro: alert  Assessment & Plan:   HPI: 83 y.o. female with past medical history of HTN, HLD, CKD 5 not on dialysis, history of breast cancer in remission, uterine cancer s/p hysterectomy (1995), T2DM admitted on 10/21/2024 with hypertensive emergency pulmonary edema, AKI on CKD5, and metabolic encephalopathy.  She had previously stated as an outpatient and she would not want dialysis long-term, however, trial of dialysis was started and now she has stated plan to continue at this time (although her understanding of her situation is still not clear)  Recommendations/Plan: -Continue DNR/DNI -Continue current care plan -Patient remains agreeable to continuing HD in outpatient setting -Outpatient palliative care follow up with [facility] -PMT remains available as needed  Prognosis: Guarded  Discharge Planning: Home with Palliative Services   Discussed with: Patient  Alisandra Son,  PA-C Palliative Medicine Team Team phone # [phone]  Thank you for allowing the Palliative Medicine Team to assist in the care of this patient. Please utilize secure chat with additional questions, if there is no response within 30 minutes please call the above phone number.  Palliative Medicine Team providers are available by phone from 7am to 7pm daily and can be reached through the team cell phone.  Should this patient require assistance outside of these hours, please call the patient's attending physician.  Portions of this note are a verbal dictation therefore any spelling and/or grammatical errors are due to the Dragon Medical One system interpretation.     Time Total: 25  Visit consisted of counseling and education dealing with the complex and emotionally intense issues of symptom management and palliative care in the setting of serious and potentially life-threatening illness. Greater than 50% of this time was spent counseling and coordinating care related to the above assessment and plan.  Personally spent 25 minutes in patient care including extensive chart review (labs, imaging, progress/consult notes, vital signs), medically appropraite exam, discussed with treatment team, education to patient, family, and staff, documenting clinical information, medication review and management, coordination of care, and available advanced directive documents.

## 2024-10-31 NOTE — Progress Notes (Addendum)
 Progress Note    10/31/2024 8:35 AM 1 Day Post-Op  Subjective: No complaints    Vitals:   10/31/24 0424 10/31/24 0742  BP: 136/75 (!) 147/70  Pulse:  69  Resp:  16  Temp: 99.2 F (37.3 C) 98.3 F (36.8 C)  SpO2:  99%    Physical Exam: General: Sitting up in bed, no acute distress Lungs: Nonlabored Incisions: Right IJ TDC dressed and dry.  Left arm incision dry without drainage or hematoma Extremities: Left hand is warm and well-perfused with intact motor and sensation.  Left brachiocephalic fistula with good thrill  CBC    Component Value Date/Time   WBC 7.5 10/31/2024 0450   RBC 2.42 (L) 10/31/2024 0450   HGB 7.7 (L) 10/31/2024 0450   HGB 9.6 (L) 02/15/2023 0946   HGB 10.8 (L) 11/15/2017 0759   HCT 24.1 (L) 10/31/2024 0450   HCT 33.1 (L) 11/15/2017 0759   PLT 226 10/31/2024 0450   PLT 253 02/15/2023 0946   PLT 196 11/15/2017 0759   MCV 99.6 10/31/2024 0450   MCV 90.8 11/15/2017 0759   MCH 31.8 10/31/2024 0450   MCHC 32.0 10/31/2024 0450   RDW 16.8 (H) 10/31/2024 0450   RDW 14.2 11/15/2017 0759   LYMPHSABS 0.4 (L) 10/21/2024 0817   LYMPHSABS 2.0 11/15/2017 0759   MONOABS 0.7 10/21/2024 0817   MONOABS 0.9 11/15/2017 0759   EOSABS 0.0 10/21/2024 0817   EOSABS 0.2 11/15/2017 0759   BASOSABS 0.0 10/21/2024 0817   BASOSABS 0.1 11/15/2017 0759    BMET    Component Value Date/Time   NA 136 10/30/2024 1143   NA 141 11/15/2017 0759   K 4.0 10/30/2024 1143   K 4.4 11/15/2017 0759   CL 101 10/30/2024 1143   CO2 26 10/30/2024 0250   CO2 26 11/15/2017 0759   GLUCOSE 105 (H) 10/30/2024 1143   GLUCOSE 176 (H) 11/15/2017 0759   BUN 38 (H) 10/30/2024 1143   BUN 29.8 (H) 11/15/2017 0759   CREATININE 5.80 (H) 10/30/2024 1143   CREATININE 4.38 (H) 02/27/2023 0918   CREATININE 1.1 11/15/2017 0759   CALCIUM  8.1 (L) 10/30/2024 0250   CALCIUM  10.6 (H) 11/15/2017 0759   GFRNONAA 8 (L) 10/30/2024 0250   GFRNONAA 11 (L) 02/15/2023 0946   GFRNONAA 17 (L)  01/27/2021 0950   GFRAA 20 (L) 01/27/2021 0950    INR    Component Value Date/Time   INR 1.2 01/26/2023 0250     Intake/Output Summary (Last 24 hours) at 10/31/2024 0835 Last data filed at 10/31/2024 0041 Gross per 24 hour  Intake 971.17 ml  Output 1215 ml  Net -243.83 ml      Assessment/Plan:  83 y.o. female is 1 day postop, s/p: Placement of right IJ TDC and creation of left arm AV fistula   - Per RN, the patient had some bloody oozing from her [facility] exit site overnight.  She placed quick clot on the exit site and bleeding was stopped.  She has not had any bleeding since -Left arm incision is dry without bruising or hematoma -Left upper arm brachiocephalic fistula with good thrill -Left hand is warm and well-perfused with intact motor and sensation -She can receive dialysis through her new TDC.  Stable for discharge from vascular perspective.  Will arrange follow-up with our office in 5 to 6 weeks with dialysis duplex   Ahmed Holster, PA-C [facility] [phone] 10/31/2024 8:35 AM  I have independently interviewed and examined patient and  agree with PA assessment and plan above.   Omero Kowal C. Sheree, MD [facility] [place] Office: [phone] Pager: [phone]

## 2024-10-31 NOTE — Progress Notes (Signed)
 Occupational Therapy Treatment Patient Details Name: Erica Whitney MRN: [id number] DOB: [birth year] Today's Date: 10/31/2024   History of present illness Pt is an 83 y.o. female who presented 10/21/24 with SOB, AMS, and R-sided pain. Pt admitted with acute hypoxic respiratory failure due to pulmonary edema, CKD V now ESRD, volume overload, uremic encephalopathy, and concern for pneumonia. Pt with bleeding from HD cath 11/11 and possibly will need new port. PMH: anemia in CKD, arthritis, breast cancer, CKD stage IV, depression, DM, hypercholesteremia, HTN, insomnia, peptic ulcer, uterine cancer   OT comments  Patient sitting EOB with dialysis RN.  Patient's groin site assessed with no bleeding/pressure gauze in place.  Patient largely supervision for in room mobility/toileting and stand grooming sink side.  Little closer to [facility] for ADL completion, but no LOB noted.  OT will continue efforts in the acute setting to address deficits, HH OT with family assist as needed is recommended.        If plan is discharge home, recommend the following:  A little help with walking and/or transfers;A little help with bathing/dressing/bathroom;Assistance with cooking/housework;Direct supervision/assist for medications management;Direct supervision/assist for financial management;Assist for transportation;Supervision due to cognitive status   Equipment Recommendations  None recommended by OT    Recommendations for Other Services      Precautions / Restrictions Precautions Precautions: Fall Recall of Precautions/Restrictions: Intact Restrictions Weight Bearing Restrictions Per Provider Order: No       Mobility Bed Mobility               General bed mobility comments: sitting EOB    Transfers Overall transfer level: Needs assistance Equipment used: Rolling walker (2 wheels), None Transfers: Sit to/from Stand, Bed to chair/wheelchair/BSC Sit to Stand: Supervision     Step pivot  transfers: Supervision           Balance Overall balance assessment: Needs assistance Sitting-balance support: Feet supported Sitting balance-Leahy Scale: Good     Standing balance support: Reliant on assistive device for balance Standing balance-Leahy Scale: Fair                             ADL either performed or assessed with clinical judgement   ADL       Grooming: Supervision/safety;Standing;Wash/dry hands               Lower Body Dressing: Contact guard assist;Sit to/from stand;Supervision/safety   Toilet Transfer: Ambulation;Rolling walker (2 wheels);Regular Toilet;Supervision/safety                  Extremity/Trunk Assessment Upper Extremity Assessment Upper Extremity Assessment: Overall WFL for tasks assessed   Lower Extremity Assessment Lower Extremity Assessment: Defer to PT evaluation   Cervical / Trunk Assessment Cervical / Trunk Assessment: Kyphotic    Vision Patient Visual Report: No change from baseline     Perception Perception Perception: Not tested   Praxis Praxis Praxis: Not tested   Communication Communication Communication: No apparent difficulties   Cognition Arousal: Alert Behavior During Therapy: WFL for tasks assessed/performed Cognition: History of cognitive impairments             OT - Cognition Comments: Memory deficits at baseline. Has difficulty following multistep commands.                   Following commands impaired: Follows multi-step commands with increased time      Cueing   Cueing Techniques: Verbal cues, Tactile cues, Visual cues  Exercises      Shoulder Instructions       General Comments      Pertinent Vitals/ Pain       Pain Assessment Pain Assessment: No/denies pain Pain Intervention(s): Monitored during session                                                          Frequency  Min 2X/week        Progress Toward Goals  OT  Goals(current goals can now be found in the care plan section)  Progress towards OT goals: Progressing toward goals  Acute Rehab OT Goals OT Goal Formulation: With patient Time For Goal Achievement: 11/07/24 Potential to Achieve Goals: Good  Plan      Co-evaluation                 AM-PAC OT 6 Clicks Daily Activity     Outcome Measure   Help from another person eating meals?: None Help from another person taking care of personal grooming?: A Little Help from another person toileting, which includes using toliet, bedpan, or urinal?: A Little Help from another person bathing (including washing, rinsing, drying)?: A Little Help from another person to put on and taking off regular upper body clothing?: None Help from another person to put on and taking off regular lower body clothing?: A Little 6 Click Score: 20    End of Session Equipment Utilized During Treatment: Rolling walker (2 wheels)  OT Visit Diagnosis: Unsteadiness on feet (R26.81);Muscle weakness (generalized) (M62.81);Other symptoms and signs involving cognitive function   Activity Tolerance Patient tolerated treatment well   Patient Left in chair;with call bell/phone within reach;with chair alarm set   Nurse Communication Mobility status        Time: 8683-8664 OT Time Calculation (min): 19 min  Charges: OT General Charges $OT Visit: 1 Visit OT Treatments $Self Care/Home Management : 8-22 mins  10/31/2024  RP, OTR/L  Acute Rehabilitation Services  Office:  [phone]   Charlie JONETTA Halsted 10/31/2024, 1:40 PM

## 2024-10-31 NOTE — Progress Notes (Addendum)
 [facility] NEPHROLOGY PROGRESS NOTE  Assessment/ Plan: Pt is a 83 y.o. yo female with new ESRD on dialysis.  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  The first HD was on 11/5 and has been tolerating well.   -Status post right IJ TDC and left arm brachiocephalic AV fistula created by Dr. Pearline on 11/13.  Consulted IV team to remove femoral temporary HD catheter. -She completed dialysis earlier this morning. -OP HD arranged at [facility] schedule.  Okay to resume dialysis on Tuesday, 11/18.  Discussed with primary team and social worker.  Ok to discharge from renal perspective.  # Hypertension/volume: Monitor BP, UF with HD.  # Anemia of CKD: Continue weekly Aranesp, monitor hemoglobin.  She has required blood transfusion during this hospitalization.  # CHF, optimize volume with dialysis, fluid restriction.  # CKD-MBD: Corrected calcium  level and phosphorus level acceptable.  Also on VDRA.  Subjective: Seen and examined.  She had a vascular procedure yesterday.  Denies nausea, vomiting, chest pain or shortness of breath.  Completed dialysis earlier this morning.  Objective Vital signs in last 24 hours: Vitals:   10/31/24 0103 10/31/24 0424 10/31/24 0742 10/31/24 0846  BP: 132/60 136/75 (!) 147/70   Pulse: 82  69   Resp: 20  16   Temp: 97.9 F (36.6 C) 99.2 F (37.3 C) 98.3 F (36.8 C)   TempSrc: Oral Axillary Oral   SpO2: 96%  99%   Weight:    45 kg  Height:       Weight change:   Intake/Output Summary (Last 24 hours) at 10/31/2024 1030 Last data filed at 10/31/2024 0041 Gross per 24 hour  Intake 518.17 ml  Output 1215 ml  Net -696.83 ml       Labs: RENAL PANEL Recent Labs  Lab 10/26/24 0412 10/27/24 0348 10/28/24 0238 10/29/24 0301 10/30/24 0250 10/30/24 1143  NA 133* 138 138 137 135 136  K 3.6 3.7 4.0 3.7 4.0 4.0  CL 99 101 100 100 100 101  CO2 25 24 23 27 26   --   GLUCOSE  114* 119* 104* 90 102* 105*  BUN 43* 54* 58* 28* 38* 38*  CREATININE 4.43* 5.33* 6.10* 4.08* 5.15* 5.80*  CALCIUM  7.7* 8.1* 7.7* 7.9* 8.1*  --   PHOS 3.7 4.1 4.6 3.8 4.3  --   ALBUMIN 2.2* 2.3* 2.4* 2.2* 2.1*  --     Liver Function Tests: Recent Labs  Lab 10/28/24 0238 10/29/24 0301 10/30/24 0250  ALBUMIN 2.4* 2.2* 2.1*   No results for input(s): LIPASE, AMYLASE in the last 168 hours. No results for input(s): AMMONIA in the last 168 hours. CBC: Recent Labs    07/02/24 0819 07/02/24 0822 08/13/24 0830 08/27/24 0822 09/10/24 0809 09/10/24 0811 10/08/24 0854 10/08/24 0906 10/22/24 1336 10/23/24 0324 10/29/24 0301 10/30/24 0250 10/30/24 1143 10/30/24 1954 10/31/24 0450  HGB  --    < >  --    < >  --    < >  --    < >  --    < > 7.1* 6.3* 8.5* 8.3* 7.7*  MCV  --   --   --   --   --   --   --    < >  --    < > 99.6 102.5*  --  100.8* 99.6  FERRITIN 226  --  192  --  192  --  281  --  273  --   --   --   --   --   --   TIBC 188*  --  189*  --  209*  --  185*  --  140*  --   --   --   --   --   --   IRON  40  --  64  --  81  --  90  --  18*  --   --   --   --   --   --    < > = values in this interval not displayed.    Cardiac Enzymes: No results for input(s): CKTOTAL, CKMB, CKMBINDEX, TROPONINI in the last 168 hours. CBG: Recent Labs  Lab 10/30/24 1118 10/30/24 1450 10/30/24 1614 10/31/24 0107 10/31/24 0742  GLUCAP 105* 105* 132* 112* 89    Iron  Studies: No results for input(s): IRON , TIBC, TRANSFERRIN, FERRITIN in the last 72 hours. Studies/Results: DG Chest Port 1 View Result Date: 10/30/2024 EXAM: 1 VIEW(S) XRAY OF THE CHEST 10/30/2024 03:00:06 PM COMPARISON: 10/22/2024 CLINICAL HISTORY: S/P dialysis catheter insertion [id number] FINDINGS: LINES, TUBES AND DEVICES: Right IJ hemodialysis catheter in place with tip in the high left atrium. LUNGS AND PLEURA: Central pulmonary vascular congestion with overall improvement of the pulmonary edema.  New left lung apex airspace opacity. No pleural effusion. No pneumothorax. HEART AND MEDIASTINUM: Cardiomegaly. Tortuous aorta with aortic atherosclerosis. BONES AND SOFT TISSUES: Right breast surgical clips. No acute osseous abnormality. IMPRESSION: 1. Interval placement of a right IJ approach hemodialysis catheter, which terminates in the high right atrium. No pneumothorax. 2. New airspace opacity in the left lung apex, which may be artifactual due to patient rotation. Alternatively, this could represent atelectasis or a developing bronchopneumonia, in the correct clinical context. 3. Central pulmonary vascular congestion with overall improvement of pulmonary edema. Electronically signed by: Rogelia Myers MD 10/30/2024 03:27 PM EST RP Workstation: [id number]   HYBRID OR IMAGING ([facility] ONLY) Result Date: 10/30/2024 There is no interpretation for this exam.  This order is for images obtained during a surgical procedure.  Please See Surgeries Tab for more information regarding the procedure.     Medications: Infusions:    Scheduled Medications:  sodium chloride    Intravenous Once   amLODipine   10 mg Oral Daily   aspirin EC  81 mg Oral Daily   atorvastatin   80 mg Oral Daily   calcitRIOL  0.25 mcg Oral Daily   Chlorhexidine  Gluconate Cloth  6 each Topical Q0600   cycloSPORINE  1 drop Both Eyes Daily   darbepoetin (ARANESP) injection - DIALYSIS  100 mcg Subcutaneous Q Fri-1800   feeding supplement (NEPRO CARB STEADY)  237 mL Oral BID BM   ferrous sulfate   325 mg Oral Q breakfast   insulin  aspart  0-5 Units Subcutaneous QHS   insulin  aspart  0-9 Units Subcutaneous TID WC   isosorbide-hydrALAZINE   2 tablet Oral TID   metoprolol  succinate  25 mg Oral Daily   pantoprazole   40 mg Oral Daily   sodium chloride  flush  3 mL Intravenous Q12H    have reviewed scheduled and prn medications.  Physical Exam: General:NAD, frail elderly female Heart:RRR, s1s2 nl Lungs:clear b/l, no  crackle Abdomen:soft, Non-tender, non-distended Extremities:No edema Dialysis Access: Femoral groin catheter , no  bleeding, this will be removed before discharge. Right IJ TDC in place, left arm AV fistula has good thrill, no bleeding.  Jas Betten Prasad Deunta Beneke 10/31/2024,10:30 AM  LOS: 10  days

## 2024-10-31 NOTE — Progress Notes (Signed)
 Physical Therapy Treatment Patient Details Name: Erica Whitney MRN: [id number] DOB: [birth year] Today's Date: 10/31/2024   History of Present Illness Pt is an 83 y.o. female who presented 10/21/24 with SOB, AMS, and R-sided pain. Pt admitted with acute hypoxic respiratory failure due to pulmonary edema, CKD V now ESRD, volume overload, uremic encephalopathy, and concern for pneumonia. Pt with bleeding from HD cath 11/11 and possibly will need new port. PMH: anemia in CKD, arthritis, breast cancer, CKD stage IV, depression, DM, hypercholesteremia, HTN, insomnia, peptic ulcer, uterine cancer    PT Comments  Pt tolerated treatment well today. Pt today was able to progress ambulation in hallway with RW at supervision/CGA however required constant cues for proximity to RW. DC recs updated to HHPT. PT will continue to follow. Pt anticipates DC home tomorrow.    If plan is discharge home, recommend the following: A little help with walking and/or transfers;A little help with bathing/dressing/bathroom;Assistance with cooking/housework;Direct supervision/assist for medications management;Direct supervision/assist for financial management;Assist for transportation;Supervision due to cognitive status   Can travel by private vehicle     Yes  Equipment Recommendations  None recommended by PT    Recommendations for Other Services       Precautions / Restrictions Precautions Precautions: Fall Recall of Precautions/Restrictions: Intact Restrictions Weight Bearing Restrictions Per Provider Order: No     Mobility  Bed Mobility               General bed mobility comments: sitting in recliner    Transfers Overall transfer level: Needs assistance Equipment used: Rolling walker (2 wheels), None Transfers: Sit to/from Stand Sit to Stand: Supervision                Ambulation/Gait Ambulation/Gait assistance: Supervision, Contact guard assist Gait Distance (Feet): 250  Feet Assistive device: Rolling walker (2 wheels), None Gait Pattern/deviations: Step-through pattern, Decreased step length - right, Decreased step length - left, Decreased stride length, Trunk flexed Gait velocity: reduced     General Gait Details: Pt takes slow, small steps with a flexed posture. Pt needed frequent cues to remain more proximal to her RW with poor carryover noted. No LOB when using RW, CGA for safety.   Stairs             Wheelchair Mobility     Tilt Bed    Modified Rankin (Stroke Patients Only)       Balance Overall balance assessment: Needs assistance Sitting-balance support: Feet supported Sitting balance-Leahy Scale: Good     Standing balance support: Reliant on assistive device for balance Standing balance-Leahy Scale: Fair                              Musician Communication: No apparent difficulties  Cognition Arousal: Alert Behavior During Therapy: WFL for tasks assessed/performed                               Following commands impaired: Follows multi-step commands with increased time    Cueing Cueing Techniques: Verbal cues, Tactile cues, Visual cues  Exercises      General Comments General comments (skin integrity, edema, etc.): VSS      Pertinent Vitals/Pain Pain Assessment Pain Assessment: No/denies pain    Home Living  Prior Function            PT Goals (current goals can now be found in the care plan section) Progress towards PT goals: Progressing toward goals    Frequency    Min 2X/week      PT Plan      Co-evaluation              AM-PAC PT 6 Clicks Mobility   Outcome Measure  Help needed turning from your back to your side while in a flat bed without using bedrails?: A Little Help needed moving from lying on your back to sitting on the side of a flat bed without using bedrails?: A Little Help needed moving to and  from a bed to a chair (including a wheelchair)?: A Little Help needed standing up from a chair using your arms (e.g., wheelchair or bedside chair)?: A Little Help needed to walk in hospital room?: A Little Help needed climbing 3-5 steps with a railing? : A Lot 6 Click Score: 17    End of Session Equipment Utilized During Treatment: Gait belt Activity Tolerance: Patient tolerated treatment well Patient left: with call bell/phone within reach;in chair;with chair alarm set Nurse Communication: Mobility status PT Visit Diagnosis: Unsteadiness on feet (R26.81);Other abnormalities of gait and mobility (R26.89);Muscle weakness (generalized) (M62.81);Difficulty in walking, not elsewhere classified (R26.2)     Time: 8573-8558 PT Time Calculation (min) (ACUTE ONLY): 15 min  Charges:    $Gait Training: 8-22 mins PT General Charges $$ ACUTE PT VISIT: 1 Visit                     Sharilynn Cassity B, PT, DPT Acute Rehab Services [phone]    Sharnette Kitamura 10/31/2024, 4:01 PM

## 2024-10-31 NOTE — Progress Notes (Addendum)
 PROGRESS NOTE        PATIENT DETAILS Name: Erica Whitney Age: 83 y.o. Sex: female Date of Birth: 08-02-1941 Admit Date: 10/21/2024 Admitting Physician Harden Jude GAILS, MD PCP:[facility]  Brief Summary: Patient is a 83 y.o.  female with history of CKD 5-who was brought to the ED with shortness of breath/confusion/uncontrolled hypertension-she was found to have acute pulmonary edema in the setting of progression to ESRD.  She was initially admitted to the hospitalist service but became agitated-ripped off BiPAP-had uncontrolled hypertension-requiring initiation of nitroglycerin drip-and transferred to the ICU.  Per nephrology team-patient had not wanted dialysis as an outpatient-however patient was not able to make her own decisions due to encephalopathy-family elected to start hemodialysis.  She subsequently improved-and was transferred to [facility] on 11/7.  Significant events: 11/4>> admit to [facility]-but transferred to ICU due to agitation-nitroglycerin drip 11/5>> started on HD 11/7>> transferred to [facility].  Significant studies: 11/4>> chest x-ray: Findings compatible with interstitial edema 11/4>> echo: EF 45-50%,+ wall motion abnormality, moderately elevated pulmonary artery systolic pressure.  Severe MR, moderate TR  Significant microbiology data: 11/4>> COVID/influenza/RSV PCR: Negative  Procedures: 11/4>> temporary right groin dialysis catheter by PCCM. 11/13>> left brachiocephalic AVF creation, right [facility]  by vascular surgery  Consults: Nephrology cardiology PCCM Palliative care Vascular surgery  Subjective: Had dialysis last night-doing well-no major issues-right groin catheter removed-she is working with PT/OT today.  Spoke with daughter-tentatively planning on getting her home tomorrow depending on how she mobilizes today.  Objective: Vitals: Blood pressure (!) 118/55, pulse 70, temperature 98.8 F (37.1 C), temperature source Oral, resp.  rate 18, height 5' 3 (1.6 m), weight 45 kg, SpO2 97%.   Exam: Gen Exam:Alert awake-not in any distress HEENT:atraumatic, normocephalic Chest: B/L clear to auscultation anteriorly CVS:S1S2 regular Abdomen:soft non tender, non distended Extremities:no edema Neurology: Non focal Skin: no rash  Pertinent Labs/Radiology:    Latest Ref Rng & Units 10/31/2024    4:50 AM 10/30/2024    7:54 PM 10/30/2024   11:43 AM  CBC  WBC 4.0 - 10.5 K/uL 7.5  5.6    Hemoglobin 12.0 - 15.0 g/dL 7.7  8.3  8.5   Hematocrit 36.0 - 46.0 % 24.1  26.6  25.0   Platelets 150 - 400 K/uL 226  251      Lab Results  Component Value Date   NA 136 10/30/2024   K 4.0 10/30/2024   CL 101 10/30/2024   CO2 26 10/30/2024      Assessment/Plan: Acute hypoxic respiratory failure secondary to acute pulmonary edema in the setting of CKD 5 with 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  Nephrology/palliative care engaging family/patient-after extensive discussions-plans are now to continue HD as an outpatient-underwent TDC/AV fistula placement on 11/13.  Has already been clipped-has outpatient HD slot-can be started this coming Tuesday.  Per nephrology-okay to discharge home.  Acute metabolic encephalopathy Secondary to uremia Although encephalopathy markedly better-slow to respond but does respond appropriately Delirium precautions.  Hypertensive crisis Required ICU transfer nitroglycerin infusion Blood pressure now much better after HD Continue amlodipine , metoprolol , BiDil  Elevated troponin Doubt type I non-STEMI Suspect related to demand ischemia/type II non-STEMI. Echo as above with  mildly suppressed EF and some regional wall motion abnormality Cardiology ongoing-medically managed at this  point.  Normocytic anemia Secondary to critical illness and anemia related to ESRD Defer Aranesp/iron  therapies to nephrology service Drop in hemoglobin without any evidence of overt GI bleeding on 11/13-s/p 1 unit of PRBC-Hb currently stable.  Continues to have no overt evidence of bleeding.   Thrombocytopenia Mild Suspect related to uremia Has resolved.  Hypokalemia Repleted-recheck periodically  DM-2  CBG stable SSI  Recent Labs    10/31/24 0107 10/31/24 0742 10/31/24 1101  GLUCAP 112* 89 157*     History of depression Apparently no longer taking Zoloft  or Remeron  per home MAR. Supportive care  Insomnia Trazodone    Nutrition Status: Nutrition Problem: Severe Malnutrition Etiology: chronic illness Signs/Symptoms: percent weight loss, severe muscle depletion, moderate fat depletion Percent weight loss: 8 % (in 1 month) Interventions: Nepro shake ;  Code status:   Code Status: Limited: Do not attempt resuscitation (DNR) -DNR-LIMITED -Do Not Intubate/DNI    DVT Prophylaxis: Place and maintain sequential compression device Start: 10/22/24 0949 SCDs Start: 10/21/24 1948   Family Communication: Daughter at [employer] the phone on 11/14.   Disposition Plan: Status is: Inpatient Remains inpatient appropriate because: Severity of illness   Planned Discharge Destination:Home health on 11/15   Diet: Diet Order             Diet renal with fluid restriction Fluid restriction: 1200 mL Fluid; Room service appropriate? No; Fluid consistency: Thin  Diet effective now                     Antimicrobial agents: Anti-infectives (From admission, onward)    Start     Dose/Rate Route Frequency Ordered Stop   10/23/24 1100  doxycycline (VIBRA-TABS) tablet 100 mg        100 mg Oral Every 12 hours 10/23/24 1005 10/27/24 2136        MEDICATIONS: Scheduled Meds:  sodium chloride    Intravenous Once   amLODipine   10 mg Oral Daily   aspirin EC  81 mg Oral  Daily   atorvastatin   80 mg Oral Daily   calcitRIOL  0.25 mcg Oral Daily   Chlorhexidine  Gluconate Cloth  6 each Topical Q0600   cycloSPORINE  1 drop Both Eyes Daily   darbepoetin (ARANESP) injection - DIALYSIS  100 mcg Subcutaneous Q Fri-1800   feeding supplement (NEPRO CARB STEADY)  237 mL Oral BID BM   ferrous sulfate   325 mg Oral Q breakfast   insulin  aspart  0-5 Units Subcutaneous QHS   insulin  aspart  0-9 Units Subcutaneous TID WC   isosorbide-hydrALAZINE   2 tablet Oral TID   metoprolol  succinate  25 mg Oral Daily   pantoprazole   40 mg Oral Daily   sodium chloride  flush  3 mL Intravenous Q12H   Continuous Infusions:     PRN Meds:.acetaminophen  **OR** acetaminophen , albuterol , artificial tears, fentaNYL  (SUBLIMAZE ) injection, labetalol , oxyCODONE , senna-docusate, traZODone    I have personally reviewed following labs and imaging studies  LABORATORY DATA: CBC: Recent Labs  Lab 10/28/24 0815 10/29/24 0301 10/30/24 0250 10/30/24 1143 10/30/24 1954 10/31/24 0450  WBC 7.5 7.0 7.3  --  5.6 7.5  HGB 7.3* 7.1* 6.3* 8.5* 8.3* 7.7*  HCT 23.8* 22.9* 20.6* 25.0* 26.6* 24.1*  MCV 100.8* 99.6 102.5*  --  100.8* 99.6  PLT 172 188 193  --  251 226    Basic Metabolic Panel: Recent Labs  Lab 10/26/24 0412 10/27/24 0348 10/28/24 0238  10/29/24 0301 10/30/24 0250 10/30/24 1143  NA 133* 138 138 137 135 136  K 3.6 3.7 4.0 3.7 4.0 4.0  CL 99 101 100 100 100 101  CO2 25 24 23 27 26   --   GLUCOSE 114* 119* 104* 90 102* 105*  BUN 43* 54* 58* 28* 38* 38*  CREATININE 4.43* 5.33* 6.10* 4.08* 5.15* 5.80*  CALCIUM  7.7* 8.1* 7.7* 7.9* 8.1*  --   PHOS 3.7 4.1 4.6 3.8 4.3  --     GFR: Estimated Creatinine Clearance: 5.4 mL/min (A) (by C-G formula based on SCr of 5.8 mg/dL (H)).  Liver Function Tests: Recent Labs  Lab 10/26/24 0412 10/27/24 0348 10/28/24 0238 10/29/24 0301 10/30/24 0250  ALBUMIN 2.2* 2.3* 2.4* 2.2* 2.1*   No results for input(s): LIPASE, AMYLASE in the  last 168 hours. No results for input(s): AMMONIA in the last 168 hours.  Coagulation Profile: No results for input(s): INR, PROTIME in the last 168 hours.  Cardiac Enzymes: No results for input(s): CKTOTAL, CKMB, CKMBINDEX, TROPONINI in the last 168 hours.  BNP (last 3 results) No results for input(s): PROBNP in the last 8760 hours.  Lipid Profile: No results for input(s): CHOL, HDL, LDLCALC, TRIG, CHOLHDL, LDLDIRECT in the last 72 hours.  Thyroid  Function Tests: No results for input(s): TSH, T4TOTAL, FREET4, T3FREE, THYROIDAB in the last 72 hours.  Anemia Panel: No results for input(s): VITAMINB12, FOLATE, FERRITIN, TIBC, IRON , RETICCTPCT in the last 72 hours.   Urine analysis:    Component Value Date/Time   COLORURINE YELLOW 01/26/2023 0815   APPEARANCEUR CLEAR 01/26/2023 0815   LABSPEC 1.011 01/26/2023 0815   PHURINE 5.0 01/26/2023 0815   GLUCOSEU NEGATIVE 01/26/2023 0815   HGBUR NEGATIVE 01/26/2023 0815   BILIRUBINUR NEGATIVE 01/26/2023 0815   KETONESUR NEGATIVE 01/26/2023 0815   PROTEINUR 100 (A) 01/26/2023 0815   UROBILINOGEN 0.2 01/30/2012 1215   NITRITE NEGATIVE 01/26/2023 0815   LEUKOCYTESUR NEGATIVE 01/26/2023 0815    Sepsis Labs: Lactic Acid, Venous    Component Value Date/Time   LATICACIDVEN 0.7 01/26/2023 0556    MICROBIOLOGY: Recent Results (from the past 240 hours)  MRSA Next Gen by PCR, Nasal     Status: None   Collection Time: 10/21/24  7:47 PM   Specimen: Nasal Mucosa; Nasal Swab  Result Value Ref Range Status   MRSA by PCR Next Gen NOT DETECTED NOT DETECTED Final    Comment: (NOTE) The GeneXpert MRSA Assay (FDA approved for NASAL specimens only), is one component of a comprehensive MRSA colonization surveillance program. It is not intended to diagnose MRSA infection nor to guide or monitor treatment for MRSA infections. Test performance is not FDA approved in patients less than 46  years old. Performed at [facility], [address] [place], [place] [address]     RADIOLOGY STUDIES/RESULTS: DG Chest Port 1 View Result Date: 10/30/2024 EXAM: 1 VIEW(S) XRAY OF THE CHEST 10/30/2024 03:00:06 PM COMPARISON: 10/22/2024 CLINICAL HISTORY: S/P dialysis catheter insertion [id number] FINDINGS: LINES, TUBES AND DEVICES: Right IJ hemodialysis catheter in place with tip in the high left atrium. LUNGS AND PLEURA: Central pulmonary vascular congestion with overall improvement of the pulmonary edema. New left lung apex airspace opacity. No pleural effusion. No pneumothorax. HEART AND MEDIASTINUM: Cardiomegaly. Tortuous aorta with aortic atherosclerosis. BONES AND SOFT TISSUES: Right breast surgical clips. No acute osseous abnormality. IMPRESSION: 1. Interval placement of a right IJ approach hemodialysis catheter, which terminates in the high right atrium. No pneumothorax. 2. New  airspace opacity in the left lung apex, which may be artifactual due to patient rotation. Alternatively, this could represent atelectasis or a developing bronchopneumonia, in the correct clinical context. 3. Central pulmonary vascular congestion with overall improvement of pulmonary edema. Electronically signed by: Rogelia Myers MD 10/30/2024 03:27 PM EST RP Workstation: [id number]   HYBRID OR IMAGING ([facility] ONLY) Result Date: 10/30/2024 There is no interpretation for this exam.  This order is for images obtained during a surgical procedure.  Please See Surgeries Tab for more information regarding the procedure.       LOS: 10 days   Donalda Applebaum, MD  [facility]    To contact the attending provider between 7A-7P or the covering provider during after hours 7P-7A, please log into the web site [email or web address] and access using universal [place] password for that web site. If you do not have the password, please call the hospital operator.  10/31/2024, 2:14 PM

## 2024-10-31 NOTE — TOC Transition Note (Addendum)
 Transition of Care [facility]) - Discharge Note   Patient Details  Name: Erica Whitney MRN: [id number] Date of Birth: 12-Sep-1941  [facility] CM/SW Contact:  Corean JAYSON Canary, RN Phone Number: 10/31/2024, 2:44 PM   Clinical Narrative:     Spoke to Ms Duval's daughter over the phone. She will have PT OT and aide services from [facility]. Darleene had previously accepted. Messaged Darleene to let him know that daughter will be best contact to set up services. Plan on DC tomorrow 3:1 is in the room Final next level of care: Home w Home Health Services Barriers to Discharge: No Barriers Identified   Patient Goals and CMS Choice     Choice offered to / list presented to : Adult Children      Discharge Placement                       Discharge Plan and Services Additional resources added to the After Visit Summary for     Discharge Planning Services: CM Consult Post Acute Care Choice: Home Health          DME Arranged: N/A DME Agency: NA       HH Arranged: PT, OT, Nurse's Aide HH Agency: [facility] Date [facility] Agency Contacted: 10/27/24 Time HH Agency Contacted: 1041 Representative spoke with at [facility] Agency: Darleene Gowda  Social Drivers of Health (SDOH) Interventions SDOH Screenings   Food Insecurity: No Food Insecurity (10/22/2024)  Housing: Low Risk  (10/22/2024)  Transportation Needs: No Transportation Needs (10/22/2024)  Utilities: Not At Risk (10/22/2024)  Social Connections: Socially Isolated (10/22/2024)  Tobacco Use: Medium Risk (10/30/2024)     Readmission Risk Interventions     No data to display

## 2024-10-31 NOTE — Progress Notes (Signed)
 Received patient in bed to unit.  Alert and oriented x2-3  Informed consent signed and in chart.   TX duration:3 hours  Patient tolerated well.  Transported back to the room  Alert, without acute distress.  Hand-off given to patient's nurse.   Access used: dialysis cath  Access issues: none  Total UF removed: 1200 Medication(s) given: heplock 1.6 units per port Post HD VS: see table below Post HD weight: 45.2kg   10/31/24 0041  Vitals  Temp 99 F (37.2 C)  Temp Source Oral  BP (!) 133/57  MAP (mmHg) 80  BP Location Right Arm  BP Method Automatic  Patient Position (if appropriate) Lying  Pulse Rate 70  Pulse Rate Source Monitor  ECG Heart Rate 76  Resp 14  Weight 45.2 kg  Type of Weight Post-Dialysis  Oxygen Therapy  SpO2 96 %  O2 Device Room Air  Patient Activity (if Appropriate) In bed  Pulse Oximetry Type Continuous  During Treatment Monitoring  Blood Flow Rate (mL/min) 0 mL/min  Arterial Pressure (mmHg) -55.55 mmHg  Venous Pressure (mmHg) 23.03 mmHg  TMP (mmHg) -4.64 mmHg  Ultrafiltration Rate (mL/min) 875 mL/min  Dialysate Flow Rate (mL/min) 299 ml/min  Dialysate Potassium Concentration 3  Dialysate Calcium  Concentration 2.5  Duration of HD Treatment -hour(s) 3 hour(s)  Cumulative Fluid Removed (mL) per Treatment  1200.16  HD Safety Checks Performed Yes  Intra-Hemodialysis Comments Tolerated well  Post Treatment  Dialyzer Clearance Lightly streaked  Hemodialysis Intake (mL) 0 mL  Liters Processed 71.9  Fluid Removed (mL) 1200 mL  Tolerated HD Treatment Yes  Post-Hemodialysis Comments goal met  Hemodialysis Catheter Right Internal jugular Double lumen Permanent (Tunneled)  Placement Date/Time: 10/30/24 1329   Placed prior to admission: No  Serial / Lot #: [id number]  Expiration Date: 03/17/29  Time Out: Correct patient;Correct site;Correct procedure  Maximum sterile barrier precautions: Hand hygiene;Large sterile sheet;S...  Blue Lumen Status  Flushed;Heparin locked;Dead end cap in place  Red Lumen Status Flushed;Heparin locked;Dead end cap in place  Purple Lumen Status N/A  Catheter fill solution Heparin 1000 units/ml  Catheter fill volume (Arterial) 1.6 cc  Catheter fill volume (Venous) 1.6  Dressing Type Gauze/Drain sponge  Post treatment catheter status Capped and Clamped      Lorrene KANDICE [facility]

## 2024-10-31 NOTE — Anesthesia Postprocedure Evaluation (Signed)
 Anesthesia Post Note  Patient: Erica Whitney  Procedure(s) Performed: LEFT BRACHIOCEPHALIC ARTERIOVENOUS FISTULA CREATION (Left: Arm Upper) RIGHT INTERNAL JUGULAR DIALYSIS CATHETER INSERTION (Right: Chest)     Patient location during evaluation: PACU Anesthesia Type: General Level of consciousness: awake and alert Pain management: pain level controlled Vital Signs Assessment: post-procedure vital signs reviewed and stable Respiratory status: spontaneous breathing, nonlabored ventilation, respiratory function stable and patient connected to nasal cannula oxygen Cardiovascular status: blood pressure returned to baseline and stable Postop Assessment: no apparent nausea or vomiting Anesthetic complications: no   No notable events documented.  Last Vitals:  Vitals:   10/31/24 0424 10/31/24 0742  BP: 136/75 (!) 147/70  Pulse:  69  Resp:  16  Temp: 37.3 C 36.8 C  SpO2:  99%    Last Pain:  Vitals:   10/31/24 0742  TempSrc: Oral  PainSc:                  Venita Seng S

## 2024-10-31 NOTE — Discharge Summary (Addendum)
 PATIENT DETAILS Name: Erica Whitney Age: 83 y.o. Sex: female Date of Birth: 1941-06-08 MRN: [id number]. Admitting Physician: Harden Jude GAILS, MD PCP:[facility]  Admit Date: 10/21/2024 Discharge date: 11/01/2024  Recommendations for Outpatient Follow-up:  Follow up with PCP in 1-2 weeks Please obtain CMP/CBC in one week  Admitted From:  Home   Disposition: Home health   Discharge Condition: good  CODE STATUS:   Code Status: Limited: Do not attempt resuscitation (DNR) -DNR-LIMITED -Do Not Intubate/DNI    Diet recommendation:  Diet Order             Diet - low sodium heart healthy           Diet renal with fluid restriction Fluid restriction: 1200 mL Fluid; Room service appropriate? No; Fluid consistency: Thin  Diet effective now                    Brief Summary: Patient is a 83 y.o.  female with history of CKD 5-who was brought to the ED with shortness of breath/confusion/uncontrolled hypertension-she was found to have acute pulmonary edema in the setting of progression to ESRD.  She was initially admitted to the hospitalist service but became agitated-ripped off BiPAP-had uncontrolled hypertension-requiring initiation of nitroglycerin drip-and transferred to the ICU.  Per nephrology team-patient had not wanted dialysis as an outpatient-however patient was not able to make her own decisions due to encephalopathy-family elected to start hemodialysis.  She subsequently improved-and was transferred to [facility] on 11/7.   Significant events: 11/4>> admit to [facility]-but transferred to ICU due to agitation-nitroglycerin drip 11/5>> started on HD 11/7>> transferred to [facility].   Significant studies: 11/4>> chest x-ray: Findings compatible with interstitial edema 11/4>> echo: EF 45-50%,+ wall motion abnormality, moderately elevated pulmonary artery systolic pressure.  Severe MR, moderate TR   Significant microbiology data: 11/4>> COVID/influenza/RSV PCR: Negative    Procedures: 11/4>> temporary right groin dialysis catheter by PCCM. 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  Nephrology/palliative care engaging family/patient-after extensive discussions-plans are now to continue HD as an outpatient-underwent TDC/AV fistula placement on 11/13.  Has already been clipped-has outpatient HD slot-can be started this coming Tuesday.  Per nephrology-okay to discharge home.   Acute metabolic encephalopathy Secondary to uremia Although encephalopathy markedly better-slow to respond but does respond appropriately Delirium precautions.   Hypertensive crisis Required ICU transfer nitroglycerin infusion Blood pressure now much better after HD Continue amlodipine , metoprolol , BiDil   Elevated troponin Doubt type I non-STEMI Suspect related to demand ischemia/type II non-STEMI. Echo as above with mildly suppressed EF and some regional wall motion abnormality Cardiology ongoing-medically managed at this point.   Normocytic anemia Secondary to critical illness and anemia related to ESRD Defer Aranesp/iron  therapies to nephrology service Hb stable-did require 1 unit of PRBC-continue to follow CBC closely as an outpatient.   Thrombocytopenia Mild Suspect related to uremia Hopefully will improve with supportive care-continue to follow trend   Hypokalemia Repleted-recheck periodically   DM-2  CBG stable SSI while patient Appears diet controlled as an  outpatient.   Discharge Diagnoses:  Principal Problem:   Acute respiratory failure with hypoxia (HCC) Active Problems:   Heart failure with reduced ejection fraction (HFrEF) (HCC)   Acute kidney injury superimposed on stage 4 chronic kidney disease (HCC)   Hypertensive urgency   Elevated troponin   Acute metabolic encephalopathy   Metabolic acidosis with increased anion gap and accumulation of organic acids   Thrombocytopenia   Controlled type 2 diabetes mellitus without complication, without long-term current use of insulin  (HCC)   Depression   Insomnia   History of breast cancer   Renal failure   Protein-calorie malnutrition, severe   Discharge Instructions:  Activity:  As tolerated with Full fall precautions use walker/cane & assistance as needed  Discharge Instructions     Call MD for:  difficulty breathing, headache or visual disturbances   Complete by: As directed    Diet - low sodium heart healthy   Complete by: As directed    Discharge instructions   Complete by: As directed    Follow with Primary MD  [facility] in 1-2 weeks  Please get a complete blood count and chemistry panel checked by your Primary MD at your next visit, and again as instructed by your Primary MD.  Get Medicines reviewed and adjusted: Please take all your medications with you for your next visit with your Primary MD  Laboratory/radiological data: Please request your Primary MD to go over all hospital tests and procedure/radiological results at the follow up, please ask your Primary MD to get all Hospital records sent to his/her office.  In some cases, they will be blood work, cultures and biopsy results pending at the time of your discharge. Please request that your primary care M.D. follows up on these results.  Also Note the following: If you experience worsening of your admission symptoms, develop shortness of breath, life threatening emergency, suicidal or homicidal thoughts you  must seek medical attention immediately by calling 911 or calling your MD immediately  if symptoms less severe.  You must read complete instructions/literature along with all the possible adverse reactions/side effects for all the Medicines you take and that have been prescribed to you. Take any new Medicines after you have completely understood and accpet all the possible adverse reactions/side effects.   Do not drive when taking Pain medications or sleeping medications (Benzodaizepines)  Do not take more than prescribed Pain, Sleep and Anxiety Medications. It is not advisable to combine anxiety,sleep and pain medications without talking with your primary care practitioner  Special Instructions: If you have smoked or chewed Tobacco  in the last 2 yrs please stop smoking, stop any regular Alcohol  and or any Recreational drug use.  Wear Seat belts while driving.  Please note: You were cared for by a hospitalist during your hospital stay. Once you are discharged, your primary care physician will handle any further medical issues. Please note that NO REFILLS for any discharge medications will be authorized once you are discharged, as it is imperative that you return to your primary care physician (or establish a relationship with a primary care physician if you do not have one) for your post hospital discharge needs so that they can reassess your need for medications and monitor your lab values.   Increase activity slowly   Complete by: As directed    No wound care   Complete by: As directed       Allergies as of 11/01/2024       Reactions   Tape Itching, Rash  Medication List     STOP taking these medications    furosemide  40 MG tablet Commonly known as: LASIX    hydrALAZINE  100 MG tablet Commonly known as: APRESOLINE        TAKE these medications    acetaminophen  650 MG CR tablet Commonly known as: TYLENOL  Take 1,300 mg by mouth every 8 (eight) hours as needed for  pain.   amLODipine  10 MG tablet Commonly known as: NORVASC  Take 1 tablet (10 mg total) by mouth daily.   aspirin EC 81 MG tablet Take 1 tablet (81 mg total) by mouth daily. Swallow whole.   atorvastatin  80 MG tablet Commonly known as: LIPITOR Take 1 tablet (80 mg total) by mouth daily. What changed:  medication strength how much to take   calcitRIOL 0.25 MCG capsule Commonly known as: ROCALTROL Take 0.25 mcg by mouth daily.   cycloSPORINE 0.05 % ophthalmic emulsion Commonly known as: RESTASIS Place 1 drop into both eyes daily.   ferrous sulfate  325 (65 FE) MG tablet Take 1 tablet (325 mg total) by mouth daily with breakfast.   isosorbide-hydrALAZINE  20-37.5 MG tablet Commonly known as: BIDIL Take 1 tablet by mouth 3 (three) times daily.   metoprolol  succinate 25 MG 24 hr tablet Commonly known as: TOPROL -XL Take 25 mg by mouth daily.   mirtazapine  30 MG tablet Commonly known as: REMERON  Take 30 mg by mouth at bedtime.   pantoprazole  40 MG tablet Commonly known as: PROTONIX  Take 40 mg by mouth daily.   SALONPAS EX Apply 1 patch topically as needed (pain).   senna-docusate 8.6-50 MG tablet Commonly known as: Senokot-S Take 1 tablet by mouth at bedtime as needed for mild constipation or moderate constipation.   sertraline  100 MG tablet Commonly known as: ZOLOFT  Take 100 mg by mouth every morning.   traZODone  50 MG tablet Commonly known as: DESYREL  Take 50 mg by mouth at bedtime.        Contact information for follow-up providers     [facility] Follow up.   Specialty: Home Health Services Why: Someone from [facility] will contact you to arrnge start date and time for start of your Home Health Physical therapy, occupational therapy and aide service. Contact information: [address] [place] [address] [phone]         [facility] Dept[facility] of [facility] Follow up in 5 week(s).    Specialty: Vascular Surgery Why: Office will call to arrange your appt(s) Contact information: [address] [place]  [address] [phone]        [facility]. Go on 11/04/2024.   Why: Please arrive at first dialysis appointment on Tuesday the 18th at 8am.   After this first appointment, your schedule will be set as every Tuesday, Thursday, and Saturday at this clinic at 05:45 am. Contact information: [address] [place] [address] [phone]         [facility]. Schedule an appointment as soon as possible for a visit in 1 week(s).   Specialty: Internal Medicine Contact information: [address] [place] [address] [phone]              Contact information for after-discharge care     Home Medical Care     [facility]) .   Service: Home Health Services Contact information: [address] [place]  [phone]  [phone]                    Allergies  Allergen Reactions   Tape Itching and Rash     Other Procedures/Studies: DG Chest Port 1 View Result Date: 10/30/2024 EXAM: 1 VIEW(S) XRAY OF THE CHEST 10/30/2024 03:00:06 PM COMPARISON: 10/22/2024 CLINICAL HISTORY: S/P dialysis catheter insertion [id number] FINDINGS: LINES, TUBES AND DEVICES: Right IJ hemodialysis catheter in place with tip in the high left atrium. LUNGS AND PLEURA: Central pulmonary vascular congestion with overall improvement of the pulmonary edema. New left lung apex airspace opacity. No pleural effusion. No pneumothorax. HEART AND MEDIASTINUM: Cardiomegaly. Tortuous aorta with aortic atherosclerosis. BONES AND SOFT TISSUES: Right breast surgical clips. No acute osseous abnormality. IMPRESSION: 1. Interval placement of a right IJ approach hemodialysis catheter, which terminates in the high right atrium. No pneumothorax. 2. New airspace opacity in the left lung  apex, which may be artifactual due to patient rotation. Alternatively, this could represent atelectasis or a developing bronchopneumonia, in the correct clinical context. 3. Central pulmonary vascular congestion with overall improvement of pulmonary edema. Electronically signed by: Rogelia Myers MD 10/30/2024 03:27 PM EST RP Workstation: [id number]   HYBRID OR IMAGING ([facility] ONLY) Result Date: 10/30/2024 There is no interpretation for this exam.  This order is for images obtained during a surgical procedure.  Please See Surgeries Tab for more information regarding the procedure.   VAS US  UPPER EXT VEIN MAPPING (PRE-OP AVF) Result Date: 10/27/2024 UPPER EXTREMITY VEIN MAPPING Patient Name:  Erica Whitney  Date of Exam:   10/27/2024 Medical Rec #: [id number]           Accession #:    [id number] Date of Birth: October 12, 1941          Patient Gender: F Patient Age:   48 years Exam Location:  [facility] Procedure:      VAS US  UPPER EXT VEIN MAPPING (PRE-OP AVF) Referring Phys: KATHERYN SABA --------------------------------------------------------------------------------  Indications: Pre-access. History: ESRD need for HD access.  Limitations: patient movement and body habitus Comparison Study: No previous exams Performing Technologist: Jody Hill RVT, RDMS  Examination Guidelines: A complete evaluation includes B-mode imaging, spectral Doppler, color Doppler, and power Doppler as needed of all accessible portions of each vessel. Bilateral testing is considered an integral part of a complete examination. Limited examinations for reoccurring indications may be performed as noted. +-----------------+-------------+----------+---------+ Right Cephalic   Diameter (cm)Depth (cm)Findings  +-----------------+-------------+----------+---------+ Shoulder             0.32                         +-----------------+-------------+----------+---------+ Prox upper arm       0.22                          +-----------------+-------------+----------+---------+ Mid upper arm        0.24               branching +-----------------+-------------+----------+---------+ Dist upper arm       0.17                         +-----------------+-------------+----------+---------+ Antecubital fossa    0.20                         +-----------------+-------------+----------+---------+ Prox forearm  0.31               branching +-----------------+-------------+----------+---------+ Mid forearm          0.17                         +-----------------+-------------+----------+---------+ Dist forearm         0.10               branching +-----------------+-------------+----------+---------+ Wrist                0.10                         +-----------------+-------------+----------+---------+ +-----------------+-------------+----------+--------------+ Right Basilic    Diameter (cm)Depth (cm)   Findings    +-----------------+-------------+----------+--------------+ Prox upper arm       0.38        0.76                  +-----------------+-------------+----------+--------------+ Mid upper arm        0.24        0.78                  +-----------------+-------------+----------+--------------+ Dist upper arm       0.25        0.76                  +-----------------+-------------+----------+--------------+ Antecubital fossa    0.22        0.33     branching    +-----------------+-------------+----------+--------------+ Prox forearm         0.11        0.16                  +-----------------+-------------+----------+--------------+ Mid forearm          0.10        0.17     branching    +-----------------+-------------+----------+--------------+ Distal forearm       0.07        0.19                  +-----------------+-------------+----------+--------------+ Wrist                                   not visualized  +-----------------+-------------+----------+--------------+ +-----------------+-------------+----------+---------+ Left Cephalic    Diameter (cm)Depth (cm)Findings  +-----------------+-------------+----------+---------+ Shoulder             0.28                         +-----------------+-------------+----------+---------+ Prox upper arm       0.27                         +-----------------+-------------+----------+---------+ Mid upper arm        0.25                         +-----------------+-------------+----------+---------+ Dist upper arm       0.27               branching +-----------------+-------------+----------+---------+ Antecubital fossa    0.37                         +-----------------+-------------+----------+---------+ Prox forearm         0.35  branching +-----------------+-------------+----------+---------+ Mid forearm          0.22               branching +-----------------+-------------+----------+---------+ Dist forearm         0.17                         +-----------------+-------------+----------+---------+ Wrist                0.13                         +-----------------+-------------+----------+---------+ +-----------------+-------------+----------+---------+ Left Basilic     Diameter (cm)Depth (cm)Findings  +-----------------+-------------+----------+---------+ Prox upper arm     0.38/0.22  0.75/0.56 branching +-----------------+-------------+----------+---------+ Mid upper arm        0.31        0.69             +-----------------+-------------+----------+---------+ Dist upper arm       0.24        0.54             +-----------------+-------------+----------+---------+ Antecubital fossa    0.28        0.44   branching +-----------------+-------------+----------+---------+ Prox forearm         0.20        0.28             +-----------------+-------------+----------+---------+ Mid forearm           0.20        0.43             +-----------------+-------------+----------+---------+ Distal forearm       0.20        0.21             +-----------------+-------------+----------+---------+ Wrist                0.16        0.21             +-----------------+-------------+----------+---------+ *See table(s) above for measurements and observations.  Diagnosing physician: Fonda Rim Electronically signed by Fonda Rim on 10/27/2024 at 3:13:57 PM.    Final    DG CHEST PORT 1 VIEW Result Date: 10/22/2024 EXAM: 1 VIEW(S) XRAY OF THE CHEST 10/22/2024 01:30:00 PM COMPARISON: 10/22/2024 CLINICAL HISTORY: Respiratory failure (HCC) FINDINGS: LUNGS AND PLEURA: Mild pulmonary edema. Bilateral airspace opacities, right greater than left. No pleural effusion. No pneumothorax. HEART AND MEDIASTINUM: Unchanged cardiomegaly. Aortic atherosclerosis. No acute abnormality of the cardiac and mediastinal silhouettes. BONES AND SOFT TISSUES: Scoliotic curvature of thoracolumbar spine. No acute osseous abnormality. IMPRESSION: 1. Mild pulmonary edema with bilateral airspace opacities, right greater than left. 2. Cardiomegaly, unchanged. Electronically signed by: Lonni Necessary MD 10/22/2024 05:30 PM EST RP Workstation: [id number]   DG Chest Port 1 View Result Date: 10/22/2024 EXAM: 1 VIEW(S) XRAY OF THE CHEST 10/22/2024 05:33:45 AM COMPARISON: 10/21/2024 CLINICAL HISTORY: Hypoxia FINDINGS: LUNGS AND PLEURA: Increasing bilateral patchy lung opacities. Increasing interstitial changes. No pulmonary edema. No pleural effusion. No pneumothorax. HEART AND MEDIASTINUM: Overlapping cardiac leads. Cardiomegaly. BONES AND SOFT TISSUES: Surgical clips in right axillary region. Spine curvature and degenerative changes. IMPRESSION: 1. Interval progression of bilateral interstitial and patchy airspace opacities within both lungs. 2. Cardiomegaly. Electronically signed by: Waddell Calk MD 10/22/2024 05:39 AM EST RP  Workstation: [id number]   ECHOCARDIOGRAM COMPLETE Result Date: 10/21/2024    ECHOCARDIOGRAM REPORT   Patient Name:   Erica Whitney Date of Exam: 10/21/2024  Medical Rec #:  [id number]          Height:       63.0 in Accession #:    [id number]         Weight:       114.0 lb Date of Birth:  [birth year]         BSA:          1.523 m Patient Age:    82 years           BP:           123/96 mmHg Patient Gender: F                  HR:           80 bpm. Exam Location:  Inpatient Procedure: 2D Echo (Both Spectral and Color Flow Doppler were utilized during            procedure). Indications:     Dyspnea  History:         Patient has no prior history of Echocardiogram examinations.                  Signs/Symptoms:Dyspnea.  Sonographer:     Norleen Amour Referring Phys:  [id number] RONDELL A SMITH Diagnosing Phys: Salena Negri MD IMPRESSIONS  1. Left ventricular ejection fraction, by estimation, is 45 to 50%. The left ventricle has mildly decreased function. The left ventricle demonstrates regional wall motion abnormalities (see scoring diagram/findings for description). The left ventricular  internal cavity size was mildly dilated. Left ventricular diastolic parameters are consistent with Grade I diastolic dysfunction (impaired relaxation). There is moderate hypokinesis of the left ventricular, entire septal wall. The average left ventricular global longitudinal strain is -21.7 %. The global longitudinal strain is abnormal.  2. Right ventricular systolic function is normal. The right ventricular size is normal. There is moderately elevated pulmonary artery systolic pressure.  3. Left atrial size was severely dilated.  4. Right atrial size was mildly dilated.  5. There is no evidence of cardiac tamponade.  6. The mitral valve is degenerative. Severe mitral valve regurgitation.  7. Tricuspid valve regurgitation is moderate.  8. The aortic valve is tricuspid. There is mild calcification of the aortic valve. Aortic valve  regurgitation is trivial. Aortic valve sclerosis is present, with no evidence of aortic valve stenosis.  9. The inferior vena cava is dilated in size with <50% respiratory variability, suggesting right atrial pressure of 15 mmHg. 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 FINDINGS  Left Ventricle: Left ventricular ejection fraction, by estimation, is 45 to 50%. The left ventricle has mildly decreased function. The left ventricle demonstrates regional wall motion abnormalities. Moderate hypokinesis of the left ventricular, entire septal wall. The average left ventricular global longitudinal strain is -21.7 %. Strain was performed and the global longitudinal strain is abnormal. The left ventricular internal cavity size was mildly dilated. There is borderline concentric left ventricular hypertrophy. Left ventricular diastolic parameters are consistent with Grade I diastolic dysfunction (impaired relaxation).  LV Wall Scoring: The entire septum is hypokinetic. The entire anterior wall, entire lateral wall, entire inferior wall, and apex are normal. Right Ventricle: The right ventricular size is normal. No increase in right ventricular wall thickness. Right ventricular systolic function is normal. There is moderately elevated pulmonary artery systolic pressure. The tricuspid regurgitant velocity is 3.49 m/s, and with an assumed right atrial pressure of 8 mmHg, the estimated right ventricular systolic pressure is  56.7 mmHg. Left Atrium: Left atrial size was severely dilated. Right Atrium: Right atrial size was mildly dilated. Pericardium: Trivial pericardial effusion is present. The pericardial effusion is posterior to the left ventricle. There is no evidence of cardiac tamponade. Mitral Valve: The mitral valve is degenerative in appearance. Severe mitral valve regurgitation. Tricuspid Valve: The tricuspid valve is grossly normal. Tricuspid valve  regurgitation is moderate. Aortic Valve: The aortic valve is tricuspid. There is mild calcification of the aortic valve. There is mild aortic valve annular calcification. Aortic valve regurgitation is trivial. Aortic valve sclerosis is present, with no evidence of aortic valve stenosis. Pulmonic Valve: The pulmonic valve was normal in structure. Pulmonic valve regurgitation is trivial. Aorta: The aortic root is normal in size and structure. Venous: The inferior vena cava is dilated in size with less than 50% respiratory variability, suggesting right atrial pressure of 15 mmHg. IAS/Shunts: The atrial septum is grossly normal. Additional Comments: 3D was performed not requiring image post processing on an independent workstation and was indeterminate.  LEFT VENTRICLE PLAX 2D LVIDd:         5.70 cm      Diastology LVIDs:         4.20 cm      LV e' medial:    4.99 cm/s LV PW:         1.00 cm      LV E/e' medial:  27.9 LV IVS:        0.90 cm      LV e' lateral:   9.11 cm/s LVOT diam:     2.00 cm      LV E/e' lateral: 15.3 LV SV:         63 LV SV Index:   42           2D Longitudinal Strain LVOT Area:     3.14 cm     2D Strain GLS (A4C):   -19.9 % LV IVRT:       71 msec      2D Strain GLS (A3C):   -21.9 %                             2D Strain GLS (A2C):   -23.3 %                             2D Strain GLS Avg:     -21.7 % LV Volumes (MOD) LV vol d, MOD A2C: 114.0 ml LV vol d, MOD A4C: 128.0 ml LV vol s, MOD A2C: 55.9 ml LV vol s, MOD A4C: 62.2 ml LV SV MOD A2C:     58.1 ml LV SV MOD A4C:     128.0 ml LV SV MOD BP:      64.0 ml RIGHT VENTRICLE RV Basal diam:  4.30 cm     PULMONARY VEINS RV S prime:     13.40 cm/s  Diastolic Velocity: 71.00 cm/s TAPSE (M-mode): 2.3 cm      S/D Velocity:       0.60                             Systolic Velocity:  45.50 cm/s LEFT ATRIUM              Index        RIGHT ATRIUM  Index LA diam:        4.30 cm  2.82 cm/m   RA Area:     18.60 cm LA Vol (A2C):   91.3 ml  59.96 ml/m  RA  Volume:   54.00 ml  35.46 ml/m LA Vol (A4C):   115.0 ml 75.52 ml/m LA Biplane Vol: 102.0 ml 66.99 ml/m  AORTIC VALVE             PULMONIC VALVE LVOT Vmax:   105.00 cm/s PV Vmax:       1.13 m/s LVOT Vmean:  67.200 cm/s PV Peak grad:  5.1 mmHg LVOT VTI:    0.202 m  AORTA Ao Root diam: 2.70 cm Ao Asc diam:  3.00 cm MITRAL VALVE                  TRICUSPID VALVE MV Area (PHT): 5.13 cm       TR Peak grad:   48.7 mmHg MV Decel Time: 148 msec       TR Vmax:        349.00 cm/s MR Peak grad:    189.9 mmHg MR Mean grad:    127.0 mmHg   SHUNTS MR Vmax:         689.00 cm/s  Systemic VTI:  0.20 m MR Vmean:        535.0 cm/s   Systemic Diam: 2.00 cm MR PISA:         1.57 cm MR PISA Eff ROA: 7 mm MR PISA Radius:  0.50 cm MV E velocity: 139.00 cm/s MV A velocity: 115.00 cm/s MV E/A ratio:  1.21 Salena Negri MD Electronically signed by Salena Negri MD Signature Date/Time: 10/21/2024/2:49:25 PM    Final    DG Chest Portable 1 View Result Date: 10/21/2024 EXAM: 1 VIEW(S) XRAY OF THE CHEST 10/21/2024 08:50:00 AM COMPARISON: 01/26/2023 CLINICAL HISTORY: SOB FINDINGS: LUNGS AND PLEURA: Diffuse interstitial and hazy airspace opacities throughout lungs. ncreased peripheral septal markings identified within the lower lung zones are compatible with interstitial edema. No pleural effusion. No pneumothorax. HEART AND MEDIASTINUM: Mild cardiomegaly. Atherosclerotic calcifications of aortic arch. BONES AND SOFT TISSUES: Multilevel degenerative disc disease of spine. Scoliotic curvature. IMPRESSION: 1. Diffuse interstitial and hazy airspace opacities throughout the lungs, compatible with interstitial edema. Superimposed infection not excluded. 2. Mild cardiomegaly. Electronically signed by: Waddell Calk MD 10/21/2024 08:55 AM EST RP Workstation: [id number]     TODAY-DAY OF DISCHARGE:  Subjective:   Erica Whitney today has no headache,no chest abdominal pain,no new weakness tingling or numbness, feels much better wants to go  home today.   Objective:   Blood pressure 124/62, pulse 65, temperature 98.2 F (36.8 C), temperature source Oral, resp. rate 17, height 5' 3 (1.6 m), weight 45 kg, SpO2 98%. No intake or output data in the 24 hours ending 11/01/24 0921  Filed Weights   10/31/24 0041 10/31/24 0846 11/01/24 0704  Weight: 45.2 kg 45 kg 45 kg    Exam: Awake Alert, Oriented *3, No new F.N deficits, Normal affect [place].AT,PERRAL Supple Neck,No JVD, No cervical lymphadenopathy appriciated.  Symmetrical Chest wall movement, Good air movement bilaterally, CTAB RRR,No Gallops,Rubs or new Murmurs, No Parasternal Heave +ve B.Sounds, Abd Soft, Non tender, No organomegaly appriciated, No rebound -guarding or rigidity. No Cyanosis, Clubbing or edema, No new Rash or bruise   PERTINENT RADIOLOGIC STUDIES: DG Chest Port 1 View Result Date: 10/30/2024 EXAM: 1 VIEW(S) XRAY OF THE CHEST 10/30/2024 03:00:06 PM COMPARISON: 10/22/2024 CLINICAL HISTORY: S/P  dialysis catheter insertion [id number] FINDINGS: LINES, TUBES AND DEVICES: Right IJ hemodialysis catheter in place with tip in the high left atrium. LUNGS AND PLEURA: Central pulmonary vascular congestion with overall improvement of the pulmonary edema. New left lung apex airspace opacity. No pleural effusion. No pneumothorax. HEART AND MEDIASTINUM: Cardiomegaly. Tortuous aorta with aortic atherosclerosis. BONES AND SOFT TISSUES: Right breast surgical clips. No acute osseous abnormality. IMPRESSION: 1. Interval placement of a right IJ approach hemodialysis catheter, which terminates in the high right atrium. No pneumothorax. 2. New airspace opacity in the left lung apex, which may be artifactual due to patient rotation. Alternatively, this could represent atelectasis or a developing bronchopneumonia, in the correct clinical context. 3. Central pulmonary vascular congestion with overall improvement of pulmonary edema. Electronically signed by: Rogelia Myers MD 10/30/2024 03:27 PM  EST RP Workstation: [id number]   HYBRID OR IMAGING ([facility] ONLY) Result Date: 10/30/2024 There is no interpretation for this exam.  This order is for images obtained during a surgical procedure.  Please See Surgeries Tab for more information regarding the procedure.     PERTINENT LAB RESULTS: CBC: Recent Labs    10/30/24 1954 10/31/24 0450  WBC 5.6 7.5  HGB 8.3* 7.7*  HCT 26.6* 24.1*  PLT 251 226   CMET CMP     Component Value Date/Time   NA 136 10/30/2024 1143   NA 141 11/15/2017 0759   K 4.0 10/30/2024 1143   K 4.4 11/15/2017 0759   CL 101 10/30/2024 1143   CO2 26 10/30/2024 0250   CO2 26 11/15/2017 0759   GLUCOSE 105 (H) 10/30/2024 1143   GLUCOSE 176 (H) 11/15/2017 0759   BUN 38 (H) 10/30/2024 1143   BUN 29.8 (H) 11/15/2017 0759   CREATININE 5.80 (H) 10/30/2024 1143   CREATININE 4.38 (H) 02/27/2023 0918   CREATININE 1.1 11/15/2017 0759   CALCIUM  8.1 (L) 10/30/2024 0250   CALCIUM  10.6 (H) 11/15/2017 0759   PROT 6.6 10/21/2024 0817   PROT 7.8 11/15/2017 0759   ALBUMIN 2.1 (L) 10/30/2024 0250   ALBUMIN 3.8 11/15/2017 0759   AST 40 10/21/2024 0817   AST 14 (L) 02/15/2023 0946   AST 18 11/15/2017 0759   ALT 24 10/21/2024 0817   ALT 7 02/15/2023 0946   ALT 21 11/15/2017 0759   ALKPHOS 45 10/21/2024 0817   ALKPHOS 98 11/15/2017 0759   BILITOT 0.9 10/21/2024 0817   BILITOT 0.6 02/15/2023 0946   BILITOT 0.47 11/15/2017 0759   EGFR 10 (L) 02/27/2023 0918   GFRNONAA 8 (L) 10/30/2024 0250   GFRNONAA 11 (L) 02/15/2023 0946   GFRNONAA 17 (L) 01/27/2021 0950    GFR Estimated Creatinine Clearance: 5.4 mL/min (A) (by C-G formula based on SCr of 5.8 mg/dL (H)). No results for input(s): LIPASE, AMYLASE in the last 72 hours. No results for input(s): CKTOTAL, CKMB, CKMBINDEX, TROPONINI in the last 72 hours. Invalid input(s): POCBNP No results for input(s): DDIMER in the last 72 hours. No results for input(s): HGBA1C in the last 72 hours. No results for  input(s): CHOL, HDL, LDLCALC, TRIG, CHOLHDL, LDLDIRECT in the last 72 hours. No results for input(s): TSH, T4TOTAL, T3FREE, THYROIDAB in the last 72 hours.  Invalid input(s): FREET3 No results for input(s): VITAMINB12, FOLATE, FERRITIN, TIBC, IRON , RETICCTPCT in the last 72 hours. Coags: No results for input(s): INR in the last 72 hours.  Invalid input(s): PT Microbiology: No results found for this or any previous visit (from the past 240 hours).   FURTHER DISCHARGE  INSTRUCTIONS:  Get Medicines reviewed and adjusted: Please take all your medications with you for your next visit with your Primary MD  Laboratory/radiological data: Please request your Primary MD to go over all hospital tests and procedure/radiological results at the follow up, please ask your Primary MD to get all Hospital records sent to his/her office.  In some cases, they will be blood work, cultures and biopsy results pending at the time of your discharge. Please request that your primary care M.D. goes through all the records of your hospital data and follows up on these results.  Also Note the following: If you experience worsening of your admission symptoms, develop shortness of breath, life threatening emergency, suicidal or homicidal thoughts you must seek medical attention immediately by calling 911 or calling your MD immediately  if symptoms less severe.  You must read complete instructions/literature along with all the possible adverse reactions/side effects for all the Medicines you take and that have been prescribed to you. Take any new Medicines after you have completely understood and accpet all the possible adverse reactions/side effects.   Do not drive when taking Pain medications or sleeping medications (Benzodaizepines)  Do not take more than prescribed Pain, Sleep and Anxiety Medications. It is not advisable to combine anxiety,sleep and pain medications without  talking with your primary care practitioner  Special Instructions: If you have smoked or chewed Tobacco  in the last 2 yrs please stop smoking, stop any regular Alcohol  and or any Recreational drug use.  Wear Seat belts while driving.  Please note: You were cared for by a hospitalist during your hospital stay. Once you are discharged, your primary care physician will handle any further medical issues. Please note that NO REFILLS for any discharge medications will be authorized once you are discharged, as it is imperative that you return to your primary care physician (or establish a relationship with a primary care physician if you do not have one) for your post hospital discharge needs so that they can reassess your need for medications and monitor your lab values.  Total Time spent coordinating discharge including counseling, education and face to face time equals greater than 30 minutes.  SignedBETHA Donalda Applebaum 11/01/2024 9:21 AM

## 2024-11-01 ENCOUNTER — Other Ambulatory Visit (HOSPITAL_COMMUNITY): Payer: Self-pay

## 2024-11-01 DIAGNOSIS — J9601 Acute respiratory failure with hypoxia: Secondary | ICD-10-CM | POA: Diagnosis not present

## 2024-11-01 DIAGNOSIS — N179 Acute kidney failure, unspecified: Secondary | ICD-10-CM | POA: Diagnosis not present

## 2024-11-01 DIAGNOSIS — I161 Hypertensive emergency: Secondary | ICD-10-CM | POA: Diagnosis not present

## 2024-11-01 DIAGNOSIS — E872 Acidosis, unspecified: Secondary | ICD-10-CM | POA: Diagnosis not present

## 2024-11-01 LAB — GLUCOSE, CAPILLARY: Glucose-Capillary: 94 mg/dL (ref 70–99)

## 2024-11-01 MED ORDER — ISOSORB DINITRATE-HYDRALAZINE 20-37.5 MG PO TABS: 1.0000 | ORAL_TABLET | Freq: Three times a day (TID) | ORAL | Status: DC

## 2024-11-01 MED ORDER — ISOSORB DINITRATE-HYDRALAZINE 20-37.5 MG PO TABS
1.0000 | ORAL_TABLET | Freq: Three times a day (TID) | ORAL | 2 refills | Status: AC
  Filled 2024-11-01: qty 90, 30d supply, fill #0

## 2024-11-01 NOTE — Plan of Care (Signed)
  Problem: Education: Goal: Ability to demonstrate management of disease process will improve Outcome: Progressing Goal: Ability to verbalize understanding of medication therapies will improve Outcome: Progressing   Problem: Activity: Goal: Capacity to carry out activities will improve Outcome: Progressing   Problem: Cardiac: Goal: Ability to achieve and maintain adequate cardiopulmonary perfusion will improve Outcome: Progressing   Problem: Education: Goal: Knowledge of General Education information will improve Description: Including pain rating scale, medication(s)/side effects and non-pharmacologic comfort measures Outcome: Progressing   Problem: Health Behavior/Discharge Planning: Goal: Ability to manage health-related needs will improve Outcome: Progressing   Problem: Clinical Measurements: Goal: Ability to maintain clinical measurements within normal limits will improve Outcome: Progressing Goal: Will remain free from infection Outcome: Progressing Goal: Diagnostic test results will improve Outcome: Progressing Goal: Respiratory complications will improve Outcome: Progressing Goal: Cardiovascular complication will be avoided Outcome: Progressing   Problem: Activity: Goal: Risk for activity intolerance will decrease Outcome: Progressing   Problem: Nutrition: Goal: Adequate nutrition will be maintained Outcome: Progressing   Problem: Coping: Goal: Level of anxiety will decrease Outcome: Progressing   Problem: Elimination: Goal: Will not experience complications related to bowel motility Outcome: Progressing Goal: Will not experience complications related to urinary retention Outcome: Progressing   Problem: Pain Managment: Goal: General experience of comfort will improve and/or be controlled Outcome: Progressing   Problem: Safety: Goal: Ability to remain free from injury will improve Outcome: Progressing   Problem: Skin Integrity: Goal: Risk for  impaired skin integrity will decrease Outcome: Progressing   Problem: Education: Goal: Knowledge of disease and its progression will improve Outcome: Progressing Goal: Individualized Educational Video(s) Outcome: Progressing   Problem: Fluid Volume: Goal: Compliance with measures to maintain balanced fluid volume will improve Outcome: Progressing   Problem: Health Behavior/Discharge Planning: Goal: Ability to manage health-related needs will improve Outcome: Progressing   Problem: Nutritional: Goal: Ability to make healthy dietary choices will improve Outcome: Progressing   Problem: Clinical Measurements: Goal: Complications related to the disease process, condition or treatment will be avoided or minimized Outcome: Progressing

## 2024-11-01 NOTE — Progress Notes (Signed)
 [facility] NEPHROLOGY PROGRESS NOTE  Assessment/ Plan: Pt is a 83 y.o. yo female with new ESRD on dialysis.  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  The first HD was on 11/5 and has been tolerating well.   -Status post right IJ TDC and left arm brachiocephalic AV fistula created by Dr. Pearline on 11/13.  Femoral catheter was removed. -She completed dialysis early morning yesterday. -OP HD arranged at [facility] schedule.  Okay to resume dialysis on Tuesday, 11/18.  Discussed with primary team and social worker.  Ok to discharge from renal perspective.  # Hypertension/volume: Monitor BP, UF with HD.  # Anemia of CKD: Continue weekly Aranesp, monitor hemoglobin.  She has required blood transfusion during this hospitalization.  # CHF, optimize volume with dialysis, fluid restriction.  # CKD-MBD: Corrected calcium  level and phosphorus level acceptable.  Also on VDRA.  Subjective: Seen and examined.  No new event.  Ready to go home today.  Denies nausea, vomiting, chest pain or shortness of breath.  Discussed with the primary team. Objective Vital signs in last 24 hours: Vitals:   11/01/24 0704 11/01/24 0747 11/01/24 0800 11/01/24 0842  BP:  118/63 (!) 119/45 124/62  Pulse:  74 72 65  Resp:  18 17   Temp:  98.2 F (36.8 C)    TempSrc:  Oral    SpO2:  97% 98%   Weight: 45 kg     Height:       Weight change: -3.2 kg No intake or output data in the 24 hours ending 11/01/24 1112      Labs: RENAL PANEL Recent Labs  Lab 10/26/24 0412 10/27/24 0348 10/28/24 0238 10/29/24 0301 10/30/24 0250 10/30/24 1143  NA 133* 138 138 137 135 136  K 3.6 3.7 4.0 3.7 4.0 4.0  CL 99 101 100 100 100 101  CO2 25 24 23 27 26   --   GLUCOSE 114* 119* 104* 90 102* 105*  BUN 43* 54* 58* 28* 38* 38*  CREATININE 4.43* 5.33* 6.10* 4.08* 5.15* 5.80*  CALCIUM  7.7* 8.1* 7.7* 7.9* 8.1*  --   PHOS 3.7 4.1 4.6 3.8 4.3  --    ALBUMIN 2.2* 2.3* 2.4* 2.2* 2.1*  --     Liver Function Tests: Recent Labs  Lab 10/28/24 0238 10/29/24 0301 10/30/24 0250  ALBUMIN 2.4* 2.2* 2.1*   No results for input(s): LIPASE, AMYLASE in the last 168 hours. No results for input(s): AMMONIA in the last 168 hours. CBC: Recent Labs    07/02/24 0819 07/02/24 0822 08/13/24 0830 08/27/24 0822 09/10/24 0809 09/10/24 0811 10/08/24 0854 10/08/24 0906 10/22/24 1336 10/23/24 0324 10/29/24 0301 10/30/24 0250 10/30/24 1143 10/30/24 1954 10/31/24 0450  HGB  --    < >  --    < >  --    < >  --    < >  --    < > 7.1* 6.3* 8.5* 8.3* 7.7*  MCV  --   --   --   --   --   --   --    < >  --    < > 99.6 102.5*  --  100.8* 99.6  FERRITIN 226  --  192  --  192  --  281  --  273  --   --   --   --   --   --   TIBC 188*  --  189*  --  209*  --  185*  --  140*  --   --   --   --   --   --   IRON  40  --  64  --  81  --  90  --  18*  --   --   --   --   --   --    < > = values in this interval not displayed.    Cardiac Enzymes: No results for input(s): CKTOTAL, CKMB, CKMBINDEX, TROPONINI in the last 168 hours. CBG: Recent Labs  Lab 10/31/24 0742 10/31/24 1101 10/31/24 1517 10/31/24 2111 11/01/24 0746  GLUCAP 89 157* 78 151* 94    Iron  Studies: No results for input(s): IRON , TIBC, TRANSFERRIN, FERRITIN in the last 72 hours. Studies/Results: DG Chest Port 1 View Result Date: 10/30/2024 EXAM: 1 VIEW(S) XRAY OF THE CHEST 10/30/2024 03:00:06 PM COMPARISON: 10/22/2024 CLINICAL HISTORY: S/P dialysis catheter insertion [id number] FINDINGS: LINES, TUBES AND DEVICES: Right IJ hemodialysis catheter in place with tip in the high left atrium. LUNGS AND PLEURA: Central pulmonary vascular congestion with overall improvement of the pulmonary edema. New left lung apex airspace opacity. No pleural effusion. No pneumothorax. HEART AND MEDIASTINUM: Cardiomegaly. Tortuous aorta with aortic atherosclerosis. BONES AND SOFT TISSUES: Right  breast surgical clips. No acute osseous abnormality. IMPRESSION: 1. Interval placement of a right IJ approach hemodialysis catheter, which terminates in the high right atrium. No pneumothorax. 2. New airspace opacity in the left lung apex, which may be artifactual due to patient rotation. Alternatively, this could represent atelectasis or a developing bronchopneumonia, in the correct clinical context. 3. Central pulmonary vascular congestion with overall improvement of pulmonary edema. Electronically signed by: Rogelia Myers MD 10/30/2024 03:27 PM EST RP Workstation: [id number]   HYBRID OR IMAGING ([facility] ONLY) Result Date: 10/30/2024 There is no interpretation for this exam.  This order is for images obtained during a surgical procedure.  Please See Surgeries Tab for more information regarding the procedure.     Medications: Infusions:    Scheduled Medications:  sodium chloride    Intravenous Once   amLODipine   10 mg Oral Daily   aspirin EC  81 mg Oral Daily   atorvastatin   80 mg Oral Daily   calcitRIOL  0.25 mcg Oral Daily   Chlorhexidine  Gluconate Cloth  6 each Topical Q0600   cycloSPORINE  1 drop Both Eyes Daily   darbepoetin (ARANESP) injection - DIALYSIS  100 mcg Subcutaneous Q Fri-1800   feeding supplement (NEPRO CARB STEADY)  237 mL Oral BID BM   ferrous sulfate   325 mg Oral Q breakfast   insulin  aspart  0-5 Units Subcutaneous QHS   insulin  aspart  0-9 Units Subcutaneous TID WC   isosorbide-hydrALAZINE   1 tablet Oral TID   metoprolol  succinate  25 mg Oral Daily   pantoprazole   40 mg Oral Daily   sodium chloride  flush  3 mL Intravenous Q12H    have reviewed scheduled and prn medications.  Physical Exam: General:NAD, frail elderly female Heart:RRR, s1s2 nl Lungs:clear b/l, no crackle Abdomen:soft, Non-tender, non-distended Extremities:No edema Dialysis Access: Right IJ TDC in place, left arm AV fistula has good thrill, no bleeding.  Right groin site looks clean, catheter  removed, dressing on.  Bluma Buresh Prasad Margorie Renner 11/01/2024,11:12 AM  LOS: 11 days

## 2024-11-01 NOTE — TOC Transition Note (Signed)
 Transition of Care [facility]) - Discharge Note   Patient Details  Name: Erica Whitney MRN: [id number] Date of Birth: Jan 17, 1941  Transition of [facility] CM/SW Contact:  Marval Gell, RN Phone Number: 11/01/2024, 9:16 AM   Clinical Narrative:     Janese Cella that patient will DC today   Final next level of care: Home w Home Health Services Barriers to Discharge: No Barriers Identified   Patient Goals and CMS Choice     Choice offered to / list presented to : Adult Children      Discharge Placement                       Discharge Plan and Services Additional resources added to the After Visit Summary for     Discharge Planning Services: CM Consult Post Acute Care Choice: Home Health          DME Arranged: N/A DME Agency: NA       HH Arranged: PT, OT, Nurse's Aide HH Agency: [facility] Date [facility] Agency Contacted: 11/01/24 Time HH Agency Contacted: [phone] Representative spoke with at [facility] Agency: Darleene  Social Drivers of Health (SDOH) Interventions SDOH Screenings   Food Insecurity: No Food Insecurity (10/22/2024)  Housing: Low Risk  (10/22/2024)  Transportation Needs: No Transportation Needs (10/22/2024)  Utilities: Not At Risk (10/22/2024)  Social Connections: Socially Isolated (10/22/2024)  Tobacco Use: Medium Risk (10/30/2024)     Readmission Risk Interventions     No data to display

## 2024-11-01 NOTE — Progress Notes (Signed)
 Discharge   Patient expressed verbal understanding of discharge POC.   Patient given time to ask any questions.  Additional education included in AVS.  Alert oriented in good spirits.   Tele and PIV removed.  Pressure dressings intact. CCMD called by Primary RN.  Daughter at bedside and received AVS education.  Transferring to Toc pharm and Main A.

## 2024-11-03 NOTE — Progress Notes (Signed)
 Late note entry 11/17 0900am  D/c over weekend noted. Contacted out-pt HD clinic, [facility], to inform of d/c and anticipated arrival for 1st appt tomorrow. Ronnald, renal PA, notified to send orders. No further support needed.   Lavanda Phoebe Marter Dialysis Navigator [phone]

## 2024-11-03 NOTE — Plan of Care (Signed)
 [facility]  Initial Hemodialysis Orders Dialysis center: [facility]  Patient's name: Erica Whitney DOB: 1941-05-02 ESRD  Discharge diagnosis: ESRD with uremia/volume overload,  starting HD  AHRF 2/2 pulm edema  Hypertension   Allergies:  Allergies  Allergen Reactions   Tape Itching and Rash   Date of First Dialysis: 10/22/24 Cause of renal disease: Progressive CKD, DM/HTN  Dialysis Prescription: Dialysis Frequency: three times per week  Tx duration: 3:45  BFR: 400 DFR: 600 EDW: 46 kg   Dialyzer: 180NRe UF profile/Sodium modeling?: -- Dialysis Bath: 2 K 2 Ca  Dialysis access: Access type: R internal jugular TDC  and L arm BCF AVF placed 10/30/24 by Dr. Pearline [facility]    In Center Medications: Heparin Dose: Tight bolus prn  Type: -- VDRA: Calcitriol 0.25 po q HD  Venofer : per protocol  Mircera: 100 mcg IV q 2 weeks.  Next dose due: 11/07/24  Discharge labs:  Hgb: 7.7     K+: 4.0       Ca: 8.1    Phos: 4.3    Alb: 2.1  Please draw monthly labs on arrival.  Additional notes/follow-up:    Maisie Ronnald Acosta PA-C

## 2024-11-05 ENCOUNTER — Encounter (HOSPITAL_COMMUNITY)

## 2024-11-06 ENCOUNTER — Other Ambulatory Visit: Payer: Self-pay | Admitting: Nephrology

## 2024-11-07 ENCOUNTER — Other Ambulatory Visit: Payer: Self-pay

## 2024-11-07 DIAGNOSIS — N186 End stage renal disease: Secondary | ICD-10-CM

## 2024-12-05 ENCOUNTER — Ambulatory Visit (HOSPITAL_COMMUNITY)
Admission: RE | Admit: 2024-12-05 | Discharge: 2024-12-05 | Disposition: A | Discharge status: Home / Self Care | Source: Ambulatory Visit | Attending: Surgery | Admitting: Surgery

## 2024-12-05 ENCOUNTER — Other Ambulatory Visit (HOSPITAL_COMMUNITY): Payer: Self-pay

## 2024-12-05 ENCOUNTER — Ambulatory Visit (INDEPENDENT_AMBULATORY_CARE_PROVIDER_SITE_OTHER)

## 2024-12-05 VITALS — BP 148/73 | HR 77 | Temp 98.6°F | Wt 104.4 lb

## 2024-12-05 DIAGNOSIS — N186 End stage renal disease: Secondary | ICD-10-CM | POA: Diagnosis not present

## 2024-12-05 NOTE — Progress Notes (Signed)
" ° ° °  Postoperative Access Visit   History of Present Illness   Erica Whitney is a 83 y.o. year old female who presents for postoperative follow-up for: left brachiocephalic arteriovenous fistula (Date: 10/30/24).  The patient's wounds are healed.  The patient denies steal symptoms.  The patient is  able to complete their activities of daily living.  She is currently dialyzing via right IJ [facility] on a Tuesday, Thursday, Saturday schedule at [address] location.   Physical Examination   Vitals:   12/05/24 0943  BP: (!) 148/73  Pulse: 77  Temp: 98.6 F (37 C)  TempSrc: Temporal  Weight: 104 lb 6.4 oz (47.4 kg)   Body mass index is 18.49 kg/m.  left arm Incision is healed, palpable radial pulse, hand grip is 5/5, sensation in digits is intact, palpable thrill, bruit can be auscultated     Medical Decision Making   Erica Whitney is a 83 y.o. year old female who presents s/p left brachiocephalic arteriovenous fistula  Patent L arm AVF without signs or symptoms of steal syndrome The patient's access will be ready for use 01/27/25 The patient's tunneled dialysis catheter can be removed when Nephrology is comfortable with the performance of the fistula The patient may follow up on a prn basis   Donnice Sender PA-C [facility] Office: [phone]  Clinic MD: Pearline  "

## 2024-12-19 ENCOUNTER — Telehealth (HOSPITAL_COMMUNITY): Payer: Self-pay

## 2024-12-19 NOTE — Telephone Encounter (Signed)
 Auth Submission: NO AUTH NEEDED Site of care: Site of care: [facility] Payer: [employer] Dual Medication & CPT/J Code(s) submitted: Retacrit  (V4893) Diagnosis Code: N18.5/D63.1 Route of submission (phone, fax, portal): portal Phone # Fax # Auth type: Buy/Bill HB Units/visits requested: 20000 units q2weeks Reference number: [id number] Approval from: 12/18/24 to 12/17/25
